# Patient Record
Sex: Female | Born: 1935 | Race: White | Hispanic: No | Marital: Married | Smoking: Never smoker
Health system: Southern US, Community
[De-identification: ages and names within clinical notes are randomized; demographics above are authoritative.]

## PROBLEM LIST (undated history)

## (undated) DIAGNOSIS — R110 Nausea: Secondary | ICD-10-CM

## (undated) DIAGNOSIS — R600 Localized edema: Secondary | ICD-10-CM

## (undated) DIAGNOSIS — I10 Essential (primary) hypertension: Secondary | ICD-10-CM

## (undated) DIAGNOSIS — C671 Malignant neoplasm of dome of bladder: Secondary | ICD-10-CM

## (undated) DIAGNOSIS — K5790 Diverticulosis of intestine, part unspecified, without perforation or abscess without bleeding: Secondary | ICD-10-CM

## (undated) DIAGNOSIS — I4892 Unspecified atrial flutter: Secondary | ICD-10-CM

## (undated) DIAGNOSIS — Z950 Presence of cardiac pacemaker: Secondary | ICD-10-CM

## (undated) DIAGNOSIS — K56609 Unspecified intestinal obstruction, unspecified as to partial versus complete obstruction: Secondary | ICD-10-CM

## (undated) DIAGNOSIS — R351 Nocturia: Secondary | ICD-10-CM

## (undated) DIAGNOSIS — D509 Iron deficiency anemia, unspecified: Secondary | ICD-10-CM

## (undated) DIAGNOSIS — Z933 Colostomy status: Secondary | ICD-10-CM

## (undated) DIAGNOSIS — C569 Malignant neoplasm of unspecified ovary: Secondary | ICD-10-CM

## (undated) DIAGNOSIS — R310 Gross hematuria: Secondary | ICD-10-CM

## (undated) DIAGNOSIS — E785 Hyperlipidemia, unspecified: Secondary | ICD-10-CM

## (undated) DIAGNOSIS — I4510 Unspecified right bundle-branch block: Secondary | ICD-10-CM

## (undated) DIAGNOSIS — I495 Sick sinus syndrome: Secondary | ICD-10-CM

## (undated) DIAGNOSIS — C679 Malignant neoplasm of bladder, unspecified: Secondary | ICD-10-CM

## (undated) HISTORY — PX: ELECTROPHYSIOLOGY STUDY: SHX5802

## (undated) HISTORY — DX: Unspecified right bundle-branch block: I45.10

## (undated) HISTORY — PX: CARDIOVASCULAR STRESS TEST: SHX262

## (undated) HISTORY — DX: Diverticulosis of intestine, part unspecified, without perforation or abscess without bleeding: K57.90

## (undated) HISTORY — DX: Hyperlipidemia, unspecified: E78.5

## (undated) HISTORY — PX: CARDIAC ELECTROPHYSIOLOGY MAPPING AND ABLATION: SHX1292

## (undated) HISTORY — DX: Essential (primary) hypertension: I10

## (undated) SURGERY — ECHOCARDIOGRAM, TRANSESOPHAGEAL
Anesthesia: Moderate Sedation

---
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
## 1970-07-26 HISTORY — PX: ABDOMINAL HYSTERECTOMY: SHX81

## 1984-07-26 HISTORY — PX: BLADDER NECK SUSPENSION: SHX1240

## 1998-02-04 ENCOUNTER — Ambulatory Visit (HOSPITAL_COMMUNITY): Admission: RE | Admit: 1998-02-04 | Discharge: 1998-02-04 | Payer: Self-pay | Admitting: Family Medicine

## 1998-02-13 ENCOUNTER — Other Ambulatory Visit: Admission: RE | Admit: 1998-02-13 | Discharge: 1998-02-13 | Payer: Self-pay | Admitting: Family Medicine

## 1999-02-17 ENCOUNTER — Other Ambulatory Visit: Admission: RE | Admit: 1999-02-17 | Discharge: 1999-02-17 | Payer: Self-pay | Admitting: Family Medicine

## 2000-02-09 ENCOUNTER — Encounter: Payer: Self-pay | Admitting: Family Medicine

## 2000-02-09 ENCOUNTER — Encounter: Admission: RE | Admit: 2000-02-09 | Discharge: 2000-02-09 | Payer: Self-pay | Admitting: Family Medicine

## 2000-02-18 ENCOUNTER — Other Ambulatory Visit: Admission: RE | Admit: 2000-02-18 | Discharge: 2000-02-18 | Payer: Self-pay | Admitting: Family Medicine

## 2001-02-09 ENCOUNTER — Encounter: Admission: RE | Admit: 2001-02-09 | Discharge: 2001-02-09 | Payer: Self-pay | Admitting: Family Medicine

## 2001-02-09 ENCOUNTER — Encounter: Payer: Self-pay | Admitting: Family Medicine

## 2001-02-13 ENCOUNTER — Encounter: Payer: Self-pay | Admitting: Family Medicine

## 2001-02-13 ENCOUNTER — Encounter: Admission: RE | Admit: 2001-02-13 | Discharge: 2001-02-13 | Payer: Self-pay | Admitting: Family Medicine

## 2001-02-20 ENCOUNTER — Other Ambulatory Visit: Admission: RE | Admit: 2001-02-20 | Discharge: 2001-02-20 | Payer: Self-pay | Admitting: Family Medicine

## 2002-02-12 ENCOUNTER — Encounter: Admission: RE | Admit: 2002-02-12 | Discharge: 2002-02-12 | Payer: Self-pay | Admitting: Family Medicine

## 2002-02-12 ENCOUNTER — Encounter: Payer: Self-pay | Admitting: Family Medicine

## 2002-02-21 ENCOUNTER — Other Ambulatory Visit: Admission: RE | Admit: 2002-02-21 | Discharge: 2002-02-21 | Payer: Self-pay | Admitting: Family Medicine

## 2003-01-31 ENCOUNTER — Encounter: Admission: RE | Admit: 2003-01-31 | Discharge: 2003-01-31 | Payer: Self-pay | Admitting: Family Medicine

## 2003-01-31 ENCOUNTER — Encounter: Payer: Self-pay | Admitting: Family Medicine

## 2003-02-26 ENCOUNTER — Other Ambulatory Visit: Admission: RE | Admit: 2003-02-26 | Discharge: 2003-02-26 | Payer: Self-pay | Admitting: Family Medicine

## 2004-01-29 ENCOUNTER — Encounter: Admission: RE | Admit: 2004-01-29 | Discharge: 2004-01-29 | Payer: Self-pay | Admitting: Family Medicine

## 2004-02-20 ENCOUNTER — Other Ambulatory Visit: Admission: RE | Admit: 2004-02-20 | Discharge: 2004-02-20 | Payer: Self-pay | Admitting: Family Medicine

## 2004-12-30 ENCOUNTER — Encounter: Admission: RE | Admit: 2004-12-30 | Discharge: 2004-12-30 | Payer: Self-pay | Admitting: Otolaryngology

## 2005-02-04 ENCOUNTER — Encounter: Admission: RE | Admit: 2005-02-04 | Discharge: 2005-02-04 | Payer: Self-pay | Admitting: Family Medicine

## 2005-02-24 ENCOUNTER — Other Ambulatory Visit: Admission: RE | Admit: 2005-02-24 | Discharge: 2005-02-24 | Payer: Self-pay | Admitting: Family Medicine

## 2005-03-15 ENCOUNTER — Ambulatory Visit: Payer: Self-pay | Admitting: Gastroenterology

## 2005-04-06 ENCOUNTER — Ambulatory Visit: Payer: Self-pay | Admitting: Gastroenterology

## 2006-01-20 ENCOUNTER — Encounter: Admission: RE | Admit: 2006-01-20 | Discharge: 2006-01-20 | Payer: Self-pay | Admitting: Family Medicine

## 2006-02-07 ENCOUNTER — Encounter: Admission: RE | Admit: 2006-02-07 | Discharge: 2006-02-07 | Payer: Self-pay | Admitting: Family Medicine

## 2007-02-10 ENCOUNTER — Encounter: Admission: RE | Admit: 2007-02-10 | Discharge: 2007-02-10 | Payer: Self-pay | Admitting: Family Medicine

## 2008-02-12 ENCOUNTER — Encounter: Admission: RE | Admit: 2008-02-12 | Discharge: 2008-02-12 | Payer: Self-pay | Admitting: Family Medicine

## 2008-05-14 ENCOUNTER — Ambulatory Visit: Payer: Self-pay | Admitting: Cardiovascular Disease

## 2008-05-14 ENCOUNTER — Inpatient Hospital Stay (HOSPITAL_COMMUNITY): Admission: EM | Admit: 2008-05-14 | Discharge: 2008-05-15 | Payer: Self-pay | Admitting: Emergency Medicine

## 2008-05-17 ENCOUNTER — Ambulatory Visit: Payer: Self-pay | Admitting: Cardiology

## 2008-05-21 ENCOUNTER — Ambulatory Visit: Payer: Self-pay | Admitting: Internal Medicine

## 2008-05-21 ENCOUNTER — Inpatient Hospital Stay (HOSPITAL_COMMUNITY): Admission: RE | Admit: 2008-05-21 | Discharge: 2008-05-21 | Payer: Self-pay | Admitting: Internal Medicine

## 2008-06-12 ENCOUNTER — Ambulatory Visit: Payer: Self-pay

## 2008-06-12 ENCOUNTER — Ambulatory Visit: Payer: Self-pay | Admitting: Internal Medicine

## 2008-07-17 HISTORY — PX: TRANSTHORACIC ECHOCARDIOGRAM: SHX275

## 2008-07-26 DIAGNOSIS — Z950 Presence of cardiac pacemaker: Secondary | ICD-10-CM

## 2008-07-26 HISTORY — DX: Presence of cardiac pacemaker: Z95.0

## 2008-07-30 ENCOUNTER — Ambulatory Visit: Payer: Self-pay | Admitting: Internal Medicine

## 2008-08-05 ENCOUNTER — Ambulatory Visit: Payer: Self-pay | Admitting: Internal Medicine

## 2008-08-05 LAB — CONVERTED CEMR LAB
Basophils Absolute: 0 10*3/uL (ref 0.0–0.1)
Calcium: 9.3 mg/dL (ref 8.4–10.5)
Creatinine, Ser: 0.9 mg/dL (ref 0.4–1.2)
Eosinophils Relative: 1.8 % (ref 0.0–5.0)
GFR calc Af Amer: 78 mL/min
INR: 1 (ref 0.8–1.0)
MCHC: 34.2 g/dL (ref 30.0–36.0)
Monocytes Absolute: 0.5 10*3/uL (ref 0.1–1.0)
Neutro Abs: 3.6 10*3/uL (ref 1.4–7.7)
Neutrophils Relative %: 65 % (ref 43.0–77.0)
Potassium: 4.5 meq/L (ref 3.5–5.1)
RBC: 4.09 M/uL (ref 3.87–5.11)
WBC: 5.5 10*3/uL (ref 4.5–10.5)

## 2008-08-12 ENCOUNTER — Ambulatory Visit: Payer: Self-pay | Admitting: Internal Medicine

## 2008-08-12 ENCOUNTER — Ambulatory Visit (HOSPITAL_COMMUNITY): Admission: RE | Admit: 2008-08-12 | Discharge: 2008-08-12 | Payer: Self-pay | Admitting: Internal Medicine

## 2008-09-06 ENCOUNTER — Inpatient Hospital Stay (HOSPITAL_COMMUNITY): Admission: EM | Admit: 2008-09-06 | Discharge: 2008-09-11 | Payer: Self-pay | Admitting: Emergency Medicine

## 2008-09-10 HISTORY — PX: PERMANENT PACEMAKER INSERTION: SHX6023

## 2009-03-04 ENCOUNTER — Encounter: Payer: Self-pay | Admitting: Cardiology

## 2009-03-11 ENCOUNTER — Encounter (INDEPENDENT_AMBULATORY_CARE_PROVIDER_SITE_OTHER): Payer: Self-pay | Admitting: *Deleted

## 2009-04-01 ENCOUNTER — Encounter (INDEPENDENT_AMBULATORY_CARE_PROVIDER_SITE_OTHER): Payer: Self-pay | Admitting: *Deleted

## 2010-02-09 ENCOUNTER — Encounter: Payer: Self-pay | Admitting: Internal Medicine

## 2010-08-25 NOTE — Letter (Signed)
Summary: Colonoscopy-Changed to Office Visit Letter  [facility]  [address] [place], [place] [address]   Phone: [phone]  Fax: [phone]      February 09, 2010 MRN: [id number]   [facility] [address] [place], [place]  [address]   Dear Ms. Kovacevic,   According to our records, it is time for you to schedule a Colonoscopy in the month of September 2011. However, after reviewing your medical record, I feel that an office visit would be most appropriate to more completely evaluate you and determine your need for a repeat procedure.  Please call [phone] (option #2) at your convenience to schedule an office visit. If you have any questions, concerns, or feel that this letter is in error, we would appreciate your call.   Sincerely,  Hedwig Morton. Juanda Chance, M.D.  [facility] [phone]

## 2010-11-10 LAB — CBC
HCT: 29.2 % — ABNORMAL LOW (ref 36.0–46.0)
HCT: 35.1 % — ABNORMAL LOW (ref 36.0–46.0)
HCT: 40.5 % (ref 36.0–46.0)
HCT: 32.5 % — ABNORMAL LOW (ref 36.0–46.0)
Hemoglobin: 11.2 g/dL — ABNORMAL LOW (ref 12.0–15.0)
Hemoglobin: 11.5 g/dL — ABNORMAL LOW (ref 12.0–15.0)
MCHC: 34.3 g/dL (ref 30.0–36.0)
MCHC: 34.3 g/dL (ref 30.0–36.0)
MCHC: 34.5 g/dL (ref 30.0–36.0)
MCHC: 35 g/dL (ref 30.0–36.0)
MCV: 92.7 fL (ref 78.0–100.0)
MCV: 92.9 fL (ref 78.0–100.0)
MCV: 91.5 fL (ref 78.0–100.0)
MCV: 92.2 fL (ref 78.0–100.0)
Platelets: 165 10*3/uL (ref 150–400)
Platelets: 203 10*3/uL (ref 150–400)
Platelets: 226 10*3/uL (ref 150–400)
RBC: 4.38 MIL/uL (ref 3.87–5.11)
RBC: 3.56 MIL/uL — ABNORMAL LOW (ref 3.87–5.11)
RDW: 13.5 % (ref 11.5–15.5)
RDW: 13.8 % (ref 11.5–15.5)
RDW: 13.7 % (ref 11.5–15.5)
RDW: 14 % (ref 11.5–15.5)
WBC: 4.4 10*3/uL (ref 4.0–10.5)
WBC: 5.3 10*3/uL (ref 4.0–10.5)

## 2010-11-10 LAB — BASIC METABOLIC PANEL
BUN: 8 mg/dL (ref 6–23)
CO2: 26 mEq/L (ref 19–32)
CO2: 23 mEq/L (ref 19–32)
Calcium: 9.3 mg/dL (ref 8.4–10.5)
Calcium: 8.8 mg/dL (ref 8.4–10.5)
Chloride: 97 mEq/L (ref 96–112)
Chloride: 104 mEq/L (ref 96–112)
GFR calc Af Amer: >60 mL/min (ref >60)
GFR calc non Af Amer: 55 mL/min — ABNORMAL LOW (ref >60)
Glucose, Bld: 100 mg/dL — ABNORMAL HIGH (ref 70–99)
Glucose, Bld: 94 mg/dL (ref 70–99)
Potassium: 4.8 mEq/L (ref 3.5–5.1)
Sodium: 131 mEq/L — ABNORMAL LOW (ref 135–145)
Sodium: 135 mEq/L (ref 135–145)

## 2010-11-10 LAB — DIFFERENTIAL
Basophils Absolute: 0 10*3/uL (ref 0.0–0.1)
Basophils Absolute: 0 10*3/uL (ref 0.0–0.1)
Basophils Absolute: 0 10*3/uL (ref 0.0–0.1)
Basophils Relative: 0 % (ref 0–1)
Basophils Relative: 0 % (ref 0–1)
Eosinophils Absolute: 0.1 10*3/uL (ref 0.0–0.7)
Eosinophils Absolute: 0.1 10*3/uL (ref 0.0–0.7)
Eosinophils Relative: 3 % (ref 0–5)
Eosinophils Relative: 1 % (ref 0–5)
Eosinophils Relative: 2 % (ref 0–5)
Lymphs Abs: 1.2 10*3/uL (ref 0.7–4.0)
Monocytes Absolute: 0.4 10*3/uL (ref 0.1–1.0)
Monocytes Absolute: 0.4 10*3/uL (ref 0.1–1.0)
Monocytes Absolute: 0.5 10*3/uL (ref 0.1–1.0)
Monocytes Relative: 10 % (ref 3–12)
Neutro Abs: 3.6 10*3/uL (ref 1.7–7.7)

## 2010-11-10 LAB — COMPREHENSIVE METABOLIC PANEL
ALT: 13 U/L (ref 0–35)
AST: 18 U/L (ref 0–37)
Albumin: 3.1 g/dL — ABNORMAL LOW (ref 3.5–5.2)
Calcium: 8.2 mg/dL — ABNORMAL LOW (ref 8.4–10.5)
Chloride: 101 mEq/L (ref 96–112)
Creatinine, Ser: 0.91 mg/dL (ref 0.4–1.2)
GFR calc non Af Amer: 60 mL/min — ABNORMAL LOW (ref >60)
Total Bilirubin: 0.6 mg/dL (ref 0.3–1.2)
Total Protein: 5.5 g/dL — ABNORMAL LOW (ref 6.0–8.3)

## 2010-11-10 LAB — URINALYSIS, ROUTINE W REFLEX MICROSCOPIC
Bilirubin Urine: NEGATIVE
Glucose, UA: NEGATIVE mg/dL
Ketones, ur: 15 mg/dL — AB
Protein, ur: NEGATIVE mg/dL
Specific Gravity, Urine: 1.007 (ref 1.005–1.030)
pH: 7.5 (ref 5.0–8.0)

## 2010-11-10 LAB — URINE MICROSCOPIC-ADD ON

## 2010-11-10 LAB — CARDIAC PANEL(CRET KIN+CKTOT+MB+TROPI)
CK, MB: 4.6 ng/mL — ABNORMAL HIGH (ref 0.3–4.0)
Relative Index: 3.4 — ABNORMAL HIGH (ref 0.0–2.5)
Troponin I: 0.01 ng/mL (ref 0.00–0.06)

## 2010-11-10 LAB — PROTIME-INR
INR: 2.9 — ABNORMAL HIGH (ref 0.00–1.49)
Prothrombin Time: 25.8 seconds — ABNORMAL HIGH (ref 11.6–15.2)

## 2010-11-10 LAB — HEPARIN LEVEL (UNFRACTIONATED): Heparin Unfractionated: 0.36 IU/mL (ref 0.30–0.70)

## 2010-11-10 LAB — POCT CARDIAC MARKERS: Troponin i, poc: <0.05 ng/mL (ref 0.00–0.09)

## 2010-12-08 NOTE — Letter (Signed)
July 30, 2008    Madaline Savage, MD  [id number], [address]  [place], [place] [address]   RE:  Victoria, [name]  MRN:  [id number]  /  DOB:  [birth year]   Dear Victoria [name],   It was a pleasure seeing Victoria [name] again today at your request.  As  you recall, she has atrial flutter.  She underwent catheter ablation for  atrial flutter back in October.  Unfortunately, she had recurrent  palpitations.  An electrocardiogram from your office confirmed that she  was in 2-1 flutter again.  Her Cardizem was resumed.  She has had  problems with subsequent bradycardia as manifested by her  electrocardiogram today with a heart rate of 45.   Thromboembolic risk factors are normal for age, gender, and  hypertension.  Given her [place] VAS score of 4 and the thromboembolic risk  in the range of about 5%.   OTHER MEDICATIONS:  1. Zyrtec.  2. Aspirin.  3. Prilosec.  4. Diovan, HCT 80/12.5.  5. Zocor.   PHYSICAL EXAMINATION:  VITAL SIGNS:  Blood pressure today was mildly  elevated to 145/76.  Her pulse was 50.  HEENT:  Demonstrated no icterus or xanthoma.  NECK:  Neck veins were flat.  LUNGS:  Clear.  BACK:  Without kyphosis.  HEART:  Sounds were regular without murmurs or gallops.  ABDOMEN:  Soft.  EXTREMITIES:  No edema.   Electrocardiogram dated today demonstrated sinus rhythm of 40 with  intervals 0.17/0.08/0.47, the axis was 75 degrees.  There is T-wave  flattening in the anterior leads and inversion in V2 which are all old.   IMPRESSION:  1. Atrial flutter - Recurrent status post catheter ablation.  2. Bradycardia.  3. Thromboembolic risk factor is notable for,      a.     Age.      b.     Gender.      c.     Hypertension.   Victoria [name], Victoria [name] has recurrent atrial flutter which is moderately  symptomatic.  She also has a [place] score of 3 or [place] VAS score of 4,  both of which given her an estimated annualized risk in the range of 4-  6% in the absence of Coumadin.   I have  discussed with her that I think Coumadin and/or catheter ablation  is the appropriate therapy.  In the event that she elects not to undergo  catheter ablation, the reluctance of which derives from an expectation  that A was 100% and B that if it did not work first time, why should it  work the second time.  I think she should be on Coumadin indefinitely.  Hopefully, her bradycardia will be an issue either.   As I went over this with her she to me she had an appointment to see you  in May.  She is to get back with [place] in the short-term as to which she  would like to do.    Sincerely,      Duke Salvia, MD, [facility]  Electronically Signed    SCK/MedQ  DD: 07/30/2008  DT: 07/31/2008  Job #: [id number]

## 2010-12-08 NOTE — Op Note (Signed)
Victoria [name], Victoria [name]               ACCOUNT NO.:  [id number]   MEDICAL RECORD NO.:  [id number]          PATIENT TYPE:  OIB   LOCATION:  4705                         FACILITY:  [facility]   PHYSICIAN:  Duke Salvia, MD, FACCDATE OF BIRTH:  10-16-1926   DATE OF PROCEDURE:  05/21/2008  DATE OF DISCHARGE:  05/21/2008                               OPERATIVE REPORT   PREOPERATIVE DIAGNOSIS:  Atrial flutter.   POSTOPERATIVE DIAGNOSIS:  Atrial flutter.   PROCEDURES:  Invasive electrophysiological study, substrate mapping, and  catheter ablation.   DESCRIPTION OF PROCEDURE:  Following obtaining informed consent, the  patient was brought to the electrophysiology laboratory and placed on  the fluoroscopic table in supine position.  After routine prep and  drape, cardiac catheterization was performed with local anesthesia and  conscious sedation.  Noninvasive blood pressure monitoring,  transcutaneous oxygen saturation monitoring and end-tidal CO2 monitoring  were performed continuously throughout the procedure.  Following the  procedure, the catheters were removed.  Hemostasis was obtained and the  patient was transferred to the floor in stable condition.   Catheters of 5-French quadripolar catheter was inserted via the left  femoral vein to the AV junction.  A 6-French octapolar catheter was inserted via the right femoral vein to  the coronary sinus.  A 7-French dual decapolar catheter was inserted via the left femoral  vein to the tricuspid annulus.  An 8-French 8-mm deflectable tip catheter was inserted via the right  femoral vein to mapping sites in the posterior septal space.   Surface leads I, aVF, and V1 were monitored continuously throughout the  procedure.  Following insertion of the catheters, a stimulation protocol  included:  Incremental atrial pacing.  Incremental ventricular pacing.  Single atrial and double atrial extrastimuli at the paced cycle length  of 700  milliseconds.   RESULTS:  Surface electrocardiogram;  Initial:  Rhythm:  Sinus; RR interval 1021 milliseconds; PR interval:  170  milliseconds; QRS duration 99 milliseconds; QT interval:  429  milliseconds; P-wave duration 114 milliseconds; preexcitation:  Absent;  bundle-branch block:  Absent.  A-H interval 105 milliseconds; H-V interval:  34 milliseconds.  Final:  Rhythm:  Sinus; RR interval 1137 milliseconds; PR interval:  165  milliseconds; QRS duration 103 milliseconds; QT interval:  461  milliseconds; P-wave duration 110 milliseconds; bundle-branch block:  Absent; preexcitation:  Absent.  AV nodal function;  AV Wenckebach was less than 400 milliseconds.  VA Wenckebach was 400 milliseconds.  AV nodal conduction was continuous without evidence of echo beats.   Accessory pathway function:  No evidence of accessory pathway was  identified.   Ventricular response to programmed stimulation:  Normal for ventricular  stimulation as described previously.   Arrhythmias induced:  The patient's typical atrial flutter prompted  ablation of the atrial flutter substrate across the cavotricuspid  isthmus without the induction of the atrial flutter.  RF was applied  across the cavotricuspid isthmus at approximately 5 o'clock on the  annulus.  Progressive conduction block was seen at the posterior portion  of the isthmus with subsequent elimination of bidirectional conduction.  The A1,  A2 interval following ablation was greater than 135  milliseconds.   Radiofrequency energy; a total of 4 minutes and 31 seconds of RF energy  was applied across the cavotricuspid isthmus.   Fluoroscopy time, a total of 4 minutes and 50 seconds of fluoroscopy was  utilized at 7.5 frames per second.   IMPRESSION:  1. Normal sinus function - bradycardia.  2. Abnormal atrial function manifested by previously demonstrated      typical atrial flutter.  3. Normal atrioventricular nodal function.  4. Normal  His-Purkinje system function.  5. No accessory pathway.  6. Normal ventricular response to stimulation.   SUMMARY CONCLUSION:  The results of electrophysiological testing  confirmed a substrate for cavotricuspid isthmus dependent to atrial  flutter.  Catheter ablation across the isthmus successfully interrupted  bidirectional conduction and eliminated thereby the substrate for the  patient's arrhythmia.   The patient tolerated the procedure without apparent complication.      Duke Salvia, MD, [facility]  Electronically Signed     SCK/MEDQ  D:  05/21/2008  T:  05/21/2008  Job:  [id number]   cc:   Quita Skye. Artis Flock, M.D.

## 2010-12-08 NOTE — Discharge Summary (Signed)
NAMESKARLETH, DELMONICO               ACCOUNT NO.:  [id number]   MEDICAL RECORD NO.:  [id number]          PATIENT TYPE:  INP   LOCATION:  2007                         FACILITY:  [facility]   PHYSICIAN:  Duke Salvia, MD, FACCDATE OF BIRTH:  December 26, 1926   DATE OF ADMISSION:  05/14/2008  DATE OF DISCHARGE:  05/15/2008                               DISCHARGE SUMMARY   PROCEDURES:  None.   PRIMARY FINAL DISCHARGE DIAGNOSIS:  Atrial flutter, paroxysmal with  rapid ventricular response.   SECONDARY DIAGNOSES:  1. Hypertension.  2. Hyperlipidemia.  3. Hiatal hernia.  4. History of pleurisy with resultant rib surgery at age 12.  5. Status post hysterectomy and bladder tack.  6. History of diverticulosis.  7. Anticoagulation with Coumadin, initiated this admission.  8. Family history of coronary artery disease in her mother.  9. History of syncope x3 prior to admission, possibly secondary to      post termination pauses.   TIME AT DISCHARGE:  Thirty nine minutes.   HOSPITAL COURSE:  Ms. Mcclenahan is a 77 year old female with no previous  history of coronary artery disease.  She had palpitations for a week and  has passed out 3 times.  She came to the hospital where she was found to  be in atrial flutter and was admitted for further evaluation.   Initially, the plan was for TEE cardioversion and this was scheduled.  However, she spontaneously converted to sinus rhythm and this was  cancelled.  Upon review of telemetry, she was noted to be going in and  out of atrial flutter and Dr. Graciela Husbands felt that her atrial arrhythmia with  paroxysmal.  He, therefore, felt that she did not need further inpatient  workup but instead needed an outpatient ablation.  She was started on  Coumadin at 5 mg a day and is to get an INR checked on Friday.  A  radiofrequency catheter ablation will be scheduled for next week.  She  was also placed on Cardizem for rate control.  Ms. Verne was evaluated  by Dr. Graciela Husbands and  considered stable for discharge with close outpatient  followup.   DISCHARGE INSTRUCTIONS:  1. Her activity level is to be increased gradually with no driving      until cleared by Dr. Graciela Husbands.  2. She is to stick to a low-fat diet.  3. She is to get an INR checked on Friday, May 17, 2008, at 10:30.  4. She is to get an ablation on Tuesday, May 21, 2008 and to be at      short stay at 6:00 a.m.  She is not to eat or drink anything after      midnight and she is to hold her Cardizem the day of the procedure.   DISCHARGE MEDICATIONS:  1. Coumadin 5 mg daily, hold Sunday and Monday with the ablation on      Tuesday.  2. Aspirin 81 mg daily.  3. Cardizem CD 120 mg a day.  4. Divan HCT 80/12.5 mg daily.  5. Zocor 20 mg daily.  6. Zyrtec 10 mg daily.  Theodore Demark, PA-C      Duke Salvia, MD, [facility]  Electronically Signed    RB/MEDQ  D:  05/15/2008  T:  05/15/2008  Job:  [id number]

## 2010-12-08 NOTE — Letter (Signed)
June 12, 2008    Quita Skye. Artis Flock, M.D.  [address]  [place], [place] [address]   RE:  SHYRL, OBI  MRN:  [id number]  /  DOB:  [birth year]   Dear Rosanne Ashing,   Ms. Dullea has continued to have flutters following her ablation of  atrial flutter.  She describes these as irregular and somewhat distinct  from her preprocedural spells.  She has had no recurrent syncope.   Her thromboembolic risk factors are notable for age, hypertension, and  gender.   Her medications currently include Zocor, Diovan, Prilosec, and aspirin.  Cardizem and Coumadin were discontinued at the time of her flutter  ablation which was successful.   On examination, her blood pressure was 124/80, her pulse was 60.  Her  lungs were clear.  Heart sounds were regular.  The extremities were  without edema.   Electrocardiogram dated today demonstrated sinus rhythm at 60 with  intervals of 0.15/0.08/0.44.  The axis was leftward at -55.   IMPRESSION:  1. Atrial flutter status post ablation.  2. Recurrent tachypalpitations, question atrial fibrillation.  3. History of syncope, question post-termination pauses.  4. Thromboembolic risk factors notable for:      a.     Hypertension.      b.     Age.      c.     Gender.   Rosanne Ashing, Ms. Bottoms has recurrent tachypalpitations.  I think it is  important for [place] to understand whether they are fibrillation or flutter  so as to help better define therapeutic options.  Either would prompt [place]  to reinitiate Coumadin; I will hold off until we have monitored data to  do that.  I will, however, resume Cardizem at this time.   The issue of syncope remains a concern; if these are post-termination  pauses and atrial fibrillation is recurring, then consideration of  pacing would be in order.  If they are unassociated with pausing, then  neurally-mediated reflexes might be the explanation and would need to be  treated separately.   She is scheduled to see Dr. Chanda Busing to establish with cardiology  in about 6 weeks.  I will forward to him a copy of this letter as well  who will give her an event recorder, and I will be seeing her in 4-5  weeks' time.    Sincerely,      Duke Salvia, MD, [facility]  Electronically Signed    SCK/MedQ  DD: 06/12/2008  DT: 06/13/2008  Job #: [id number]   CC:    Madaline Savage, M.D.

## 2010-12-08 NOTE — Discharge Summary (Signed)
NAMEJERSEY, ESPINOZA               ACCOUNT NO.:  [id number]   MEDICAL RECORD NO.:  [id number]          PATIENT TYPE:  OIB   LOCATION:  4705                         FACILITY:  [facility]   PHYSICIAN:  Duke Salvia, MD, FACCDATE OF BIRTH:  13-Mar-1927   DATE OF ADMISSION:  05/21/2008  DATE OF DISCHARGE:  05/21/2008                               DISCHARGE SUMMARY   PRIMARY CARE PHYSICIAN:  Quita Skye. Kindl, MD   ALLERGIES:  This patient has allergies to BETADINE, SHELLFISH, and IV  DYE.   FINAL DIAGNOSES:  1. Paroxysmal atrial flutter/rapid ventricular rate.  2. Electrophysiology study with empiric radiofrequency catheter      ablation of caval tricuspid isthmus by Dr. Sherryl Manges.  The      patient was in sinus rhythm on presentation for her procedure.   SECONDARY DIAGNOSES:  1. Hypertension.  2. Hyperlipidemia.  3. Hiatal hernia.  4. History of diverticulosis.  5. Status post hysterectomy, status post bladder tacking.  6. History of syncope.   PROCEDURE:  May 21, 2008, electrophysiology study with  radiofrequency catheter ablation of the caval tricuspid isthmus.  This  was done empirically.  Atrial flutter could not be induced, Dr. Sherryl Manges, practitioner.   BRIEF HISTORY:  Ms. Canevari is a 77 year old female.  She has no prior  cardiac history.  About a week before presentation to Dr. Fabio Bering  office, the patient had begun to experience fluttering and tachy  palpitations.  She had no chest pain.  She had no dyspnea.   The symptoms were off and on all week, sometimes occurring for a good  portion of the day.  She did not notice any significant limitation in  activity.   On Friday night, May 10, 2008, while stooping down, she became  acutely lightheaded.  She fell as though the room were spinning and lost  consciousness.  She fell forward to the floor.  This was witnessed by  her husband.  She regained consciousness after a few seconds.  She did  not note any  fluttering after the episode.  The patient had no loss of  bowel or bladder function.  She had no unilateral weakness following the  episode.  She had another episode of dizziness that room began spinning  on Saturday morning, but did not have any loss of consciousness.  She  has continued to have fluttering and palpitations through the weekend.  She saw her primary care giver, Dr. Artis Flock on May 14, 2008, Monday.  She was transferred by way of EMS to [facility].  Electrocardiogram had  shown atrial flutter with tachycardia.  The question for practitioners  is how long has her atrial flutter been in existence and does she have  post termination pauses which cause her episodes of dizziness and  syncope.  The patient was admitted to [facility] and observed  on telemetry for a 24-hour period.  Because the patient has an episodic  or paroxysmal atrial flutter and spends most of her time in sinus  rhythm, the patient will not need a transesophageal echocardiogram prior  to  any ablation procedure.  This will be set up as soon as possible on  an elective basis and is currently scheduled for Monday, May 21, 2008.  The patient will continue on Coumadin until that time.   HOSPITAL COURSE:  The patient presents on May 21, 2008.  She was in  sinus rhythm.  She has been taking her medications.  She was admitted to  the Electrophysiology Lab.  Apparently, atrial flutter could not be  induced, however, there was empiric radiofrequency catheter ablation of  her cavotricuspid isthmus.  Once again, no inducibility of atrial  flutter.  The patient was discharged the same day, May 21, 2008.  She goes home on the following medications.  1. She is to restart enteric-coated aspirin 81 mg daily.  2. Diovan HCT 80/12.5 one tablet daily.  3. Prilosec 20 mg daily.  4. Zyrtec 10 mg daily.  5. Simvastatin 20 mg daily.   The patient is asked to stop taking her diltiazem.  She is asked to  stop  taking her Coumadin.  She follows up with [facility] to see Dr. Graciela Husbands on Wednesday, June 12, 2008 at  11:45 a.m.   LABORATORY STUDIES:  Pertinent to this admission were drawn on May 14, 2008.  Sodium 132, potassium 3.9, chloride is 98, carbonate 27,  glucose 107, BUN is 7, and creatinine is 0.81.  Alkaline phosphatase is  74, SGOT 24, and SGPT is 13.  White blood cells 5.4, hemoglobin 13.5,  hematocrit 40.2, and platelets are 216.  Blood draw on May 15, 2008  was Protime 13.8 and INR was 1.0.      Maple Mirza, [place]      Duke Salvia, MD, [facility]  Electronically Signed    GM/MEDQ  D:  05/21/2008  T:  05/22/2008  Job:  [id number]   cc:   Quita Skye. Artis Flock, M.D.

## 2010-12-08 NOTE — H&P (Signed)
[name], [name]               ACCOUNT NO.:  [id number]   MEDICAL RECORD NO.:  [id number]          PATIENT TYPE:  INP   LOCATION:  2007                         FACILITY:  [facility]   PHYSICIAN:  Victoria Pick. Eden Emms, MD, FACCDATE OF BIRTH:  Mar 06, 1927   DATE OF ADMISSION:  05/14/2008  DATE OF DISCHARGE:                              HISTORY & PHYSICAL   PRIMARY CARDIOLOGIST:  [facility] being seen by Dr. Eden [name].   PRIMARY CARE Victoria [name]:  Victoria Skye. Kindl, MD.   PATIENT PROFILE:  A 77 year old Caucasian female who presents with a 1-  week history of fluttering and palpitations and found to be in atrial  flutter.   PROBLEMS:  1. Atrial flutter with rapid ventricular response.  2. Hypertension.  3. Hyperlipidemia.  4. Hiatal hernia.  5. History of rib surgery secondary to pleurisy, scarlet fever at age      78.  79. Status post hysterectomy at age 46.  46. Status post bladder tack in 1981.  8. Diverticulosis.   HISTORY OF PRESENT ILLNESS:  A 77 year old Caucasian female without  prior cardiac history.  Approximately 1 week ago, she began to  experience fluttering and tachy palpitations without chest pain or  dyspnea.  These symptoms were intermittent all week and often occurring  for good portion of the day.  She did not notice any significant  limitations in her activities.  On Friday night on May 10, 2008,  while stooping down, she became acutely lightheaded and felt as though  the room was spinning and then subsequently lost consciousness falling  forward to the floor.  This was witnessed by her husband.  She  apparently regained the consciousness after a few seconds and did not  note any fluttering after the episode.  There was no chest pain,  shortness of breath, loss of bowel or bladder function, or unilateral  weakness following this episode.  She had another episode of dizziness  with the room spinning on Saturday morning, but did not have any  syncope.  She  continued to have fluttering and palpitations throughout  the weekend.  Saw the Dr. Artis Flock today where she was noted to be  tachycardic and A-flutter.  She was transferred via EMS to the [facility] ED.  Currently, she is asymptomatic with the exception of noticing  some fluttering.   ALLERGIES:  No known drug allergies.   HOME MEDICATIONS:  1. Simvastatin 20 mg nightly.  2. Diovan HCT 80/12.5 mg daily.  3. Prilosec 20 mg daily.  4. Aspirin 81 mg daily.   FAMILY HISTORY:  Mother died of an MI at 95.  Father died of cirrhosis  of the liver following a history of alcoholism at age 37.  She has a  brother who is alive and well.  Her sister is 28 who is status post CVA  at age 46.  She has another sister who is 23 with a history of TIAs.   SOCIAL HISTORY:  She lives in [place] with her husband.  She is  retired from the [employer].  She denies tobacco, alcohol, or  drug use.  She walks 1 mile daily and is very active around her yard  stating she has a beautiful yard.   REVIEW OF SYSTEMS:  Positive for presyncope and tachy palpitations,  otherwise all systems reviewed are negative.   PHYSICAL EXAMINATION:  VITAL SIGNS:  Temperature 97.0, heart rate 150,  respirations 22, blood pressure 160/99, and pulse ox 98% on 2 liters.  Heart rate came down to 70s following 20 mg IV bolus of diltiazem.  GENERAL:  Pleasant white female in no acute distress.  Awake, alert, and  oriented x3.  HEENT:  Normal.  NECK:  No bruits, JVD, or adenopathy.  LUNGS:  Respirations regular and unlabored.  CARDIAC:  Regular, tachycardic, S1 and S2.  No S3, S4, or murmurs.  NEUROLOGIC:  Grossly intact, nonfocal.  SKIN:  Warm and dry without lesions or masses.  MUSCULOSKELETAL:  No gross deformity with full range of motion in upper  and lower extremities.  ABDOMEN:  Round, soft, nontender, and nondistended.  Bowel sounds  present x4.  EXTREMITIES:  Warm, dry, and pink.  No clubbing, cyanosis, or  edema.  Dorsalis pedis and posterior tibial pulses 2+ and equal bilaterally.  There is no petechiae.   Chest x-ray is pending.  EKG shows atrial flutter, it is currently had  rate of 78.  Normal axes.  No acute ST-T changes.   All of her lab work is pending.   ASSESSMENT/PLAN:  1. Atrial flutter, question duration.  The patient says first symptoms      8 days ago and they were on and off all week.  She denies chest      pain or dyspnea.  Rate responded well with IV diltiazem and      currently, she is in the 70s.  Plan to admit, second cardiac      markers as well as electrolytes, magnesium, TSH, and a 2-D      echocardiogram.  Electrophysiology consults for possible      radiofrequency catheter ablation and was subsequently planned for      TE on Thursday with either ablation or cardioversion to follow.  We      will continue IV diltiazem at 5 mg an hour and subsequently convert      to p.o.  Avoid heparin and Coumadin.  2. Syncope.  Certainly, we have to question whether or not this could      be related to a post-termination pause as she denies experiencing      palpitations following syncopal episode the other night  We will      watch her closely on telemetry.  3. Hypertension.  Follow on IV diltiazem.  Continue Diovan HCT.  4. Hyperlipidemia.  Check lipids and LFTs.  Continue statin.  5. Hiatal hernia.  Continue PPI.      Victoria [name], ANP      Victoria Pick. Eden Emms, MD, [facility]  Electronically Signed    CB/MEDQ  D:  05/14/2008  T:  05/15/2008  Job:  [id number]

## 2010-12-11 NOTE — Op Note (Signed)
[name], Victoria [name]               ACCOUNT NO.:  [id number]   MEDICAL RECORD NO.:  [id number]          PATIENT TYPE:  OIB   LOCATION:  4705                         FACILITY:  [facility]   PHYSICIAN:  Duke Salvia, MD, FACCDATE OF BIRTH:  Jun 16, 1927   DATE OF PROCEDURE:  DATE OF DISCHARGE:                               OPERATIVE REPORT   PREOPERATIVE DIAGNOSIS:  Atrial flutter.   POSTOPERATIVE DIAGNOSIS:  Multiple atrial flutter defibrillation.   PROCEDURE:  Invasive electrophysiological study.   Following obtaining informed consent, the patient was brought to the  electrophysiology laboratory and placed on the fluoroscopic table in the  supine position.  After routine prep and drape, cardiac catheterization  was performed with local anesthesia and conscious sedation.  Noninvasive  blood pressure monitoring, transcutaneous oxygen saturation monitoring,  and end-tidal CO2 monitoring were performed continuously throughout the  procedure.  After the procedure, the catheters were removed, hemostasis  was obtained, and the patient was transferred to the holding area in  stable condition.   CATHETERS:  1. A 35-French quadripolar catheter was inserted via the left femoral      vein to the AV junction.  2. A 6-French octapolar catheter was inserted via the right femoral      vein to the coronary sinus.  3. A 7-French dual decapolar catheter was inserted appropriately into      the tricuspid annulus.   Surface leads I and aVF and V1 were monitored continuously throughout  the procedure. Following insertion of the catheters, a stimulation  protocol included incremental atrial pacing.   RESULTS:  Surface electrocardiogram at basic intervals.  Rhythm:  Sinus; RR interval 942 milliseconds; PR interval:  178  milliseconds; QRS duration 83 milliseconds; QT interval:  N/M; P-wave  duration 128 milliseconds; AH interval 88 milliseconds; HV interval 49  milliseconds.   Pacing across the  tricuspid annulus demonstrated conduction block at the  isthmus from her previous flutter ablation.  However, multiple other  atrial flutters and defibrillations were identified.  At this point, the  procedure was terminated, catheters were removed, and the patient was  then considered for medical therapy.   A total of 2 minutes and 30 seconds of fluoroscopy time was utilized at  7.5 frames per second.       Duke Salvia, MD, [facility]  Electronically Signed    SCK/MEDQ  D:  09/26/2008  T:  09/27/2008  Job:  [id number]

## 2010-12-11 NOTE — Discharge Summary (Signed)
NAMEBERTRICE, LEDER               ACCOUNT NO.:  [id number]   MEDICAL RECORD NO.:  [id number]          PATIENT TYPE:  INP   LOCATION:  3733                         FACILITY:  [facility]   PHYSICIAN:  Antonieta Iba, MD   DATE OF BIRTH:  September 20, 1926   DATE OF ADMISSION:  09/06/2008  DATE OF DISCHARGE:  09/11/2008                               DISCHARGE SUMMARY   DISCHARGE DIAGNOSES:  1. Syncope, secondary to paroxysmal atrial fibrillation most likely.  2. Chest pain.  Negative myocardial infarction.  3. Dyspnea on exertion.  4. Sick sinus syndrome with atrial flutter with rapid ventricular      response and history of bradycardia, unable to take rate-slowing      medications of any type.  5. Anticoagulation secondary to paroxysmal atrial fibrillation.  6. Placement of permanent transvenous pacemaker, [name].   DISCHARGE CONDITION:  Improved.   PROCEDURES:  September 10, 2008, permanent transvenous pacemaker, [employer], serial number [id number] with an A and V lead.   DISCHARGE MEDICATIONS:  1. Diovan 80 mg daily.  2. Prilosec 20 mg daily.  3. Aspirin 81 mg daily.  4. Simvastatin 20 mg daily.  5. Coumadin 5 mg daily.  6. Imdur 30 mg tablet half a tablet daily to equal 15 mg daily.  7. Metoprolol 50 mg tablet to take half a tablet twice a day.  This is      metoprolol tartrate (Lopressor).  8. Cardizem CD 240 mg 1 tablet daily.  9. He may use Benadryl at home for skin burning at pacer site      secondary to Betadine.   DISCHARGE INSTRUCTIONS:  1. Low-sodium, heart-healthy diet.  2. Follow pacemaker instruction sheet for activity and showers.  3. See Dr. Lynnea Ferrier, September 17, 2008, at noon.  4. Follow up in [facility], September 16, 2008, at 11 a.m.  5. If you have any swelling, bleeding, or pain in the pacemaker site,      call our office.   HISTORY OF PRESENT ILLNESS:  Delightful 77 year old female with complex  history of syncope leading to ablation  of SVT, atrial flutter in the  past, and the plan had been to do a further atrial flutter ablation,  May 21, 2008, but they were unable to do so.  On the day of  admission, September 05, 2008, she was making her bed, suddenly was  picking herself up off the floor.  She was on her stomach.  She had no  chest pain.  She thinks she may have been out 1 or 2 seconds, and she  had no injuries.  She did also complain of shortness of breath over the  last 2 weeks, increased with exertion, very short of breath and had to  pace herself.  In the emergency room, after her rate had slowed, she was  complaining of chest pressure.  Her blood pressure was up as well.  The  pressure would come and go.  She had had a negative nuclear study,  December 2009.  EF at that time was 83%.  Other history  includes  hypertension, hyperlipidemia, hiatal hernia, scarlet fever at age 35.  Two-D echo in December 2009, EF was greater than 55%, moderate MR,  moderate TR.   She had been followed with Dr. Graciela Husbands, and he felt a lot of her syncope  may be related when she converted from atrial flutter to sinus, she  would have conversion bradycardia.  The patient was seen and evaluated  by Dr. Mariah Milling, and we admitted her.  We stopped her Coumadin, placed her  on heparin when her INR was subtherapeutic, and we were planning for  cardiac catheterization.  We also put her on Cardizem.  She stabilized.  At one point, we were going to do a nuclear study, but she was feeling  so much better and her cardiac enzymes were negative, it was felt to  cancel on the nuclear study and proceed with permanent pacemaker, which  we did, and she tolerated that very well.  She was seen the next  morning, chest x-ray without pneumothorax, and she was stable and ready  for discharge home.   PHYSICAL EXAMINATION:  VITAL SIGNS:  At discharge, blood pressure  125/75, temperature 98.6, heart rate 62, room air oxygen saturation 93%.  CNS:  Normal.   LUNGS:  Clear to auscultation bilaterally.  HEART:  Regular rate and rhythm.  SKIN:  Warm and dry.  Pacer site without edema.   Please note the site was then cleaned by the nurses with Betadine, but  she was allergic to BETADINE.  She began itching, so the dressing site  was changed and we gave her some Benadryl prior to her discharge home.   LABORATORY DATA:  Hemoglobin 13.9, hematocrit 40.5 on admission, WBC  5.3.  Hemoglobin got as low at 10.1 with hematocrit of 29.2, but at  discharge hemoglobin 11.2, hematocrit 32.5, platelets 167, neutrophils  56, lymphs 30, monos 10, eos 3, baso 0.  Pro time 32.3 on admission with  an INR of 2.9.  The Coumadin was discontinued, and she slowly drifted  down.  She was started orally but no crossover prior to discharge.  Discharge INR was 1.2.   Chemistry:  Sodium 131, potassium 4.8, chloride 97, CO2 of 26, glucose  100, BUN 8, creatinine 0.98.  These remained stable during  hospitalization.  Total protein 5.5, albumin 3.1.  AST 18, ALT 13,  alkaline phos 66, total bili 0.6.  CK 177, 148, 151; negative MBs 5.2,  4.6, and 5.1; troponin Is were negative, less than 0.01.  TSH 3.625.  UA:  Moderate leukocyte esterase but negative nitrites.   Chest x-ray on admission, clear.  Post-pacemaker chest x-ray, 2-view,  status post placement of left subclavian permanent cardiac pacer, no  pneumothorax or other acute findings, focal eventration of the left  hemidiaphragm to the gastric fundus position in the left lower chest.   EKGs:  On admission, she was in atrial flutter, rate 144.  She slowed to  a rate of 73 and continued in flutter, though at the time of discharge  sinus rhythm.  No acute changes otherwise.   HOSPITAL COURSE:  As stated, the patient was admitted, eventually  received permanent transvenous pacemaker, and was ready for discharge  home.  She will follow up as instructed, initially with Dr. Lynnea Ferrier and  then back to see Dr.  Elsie Lincoln.      Darcella Gasman. Annie Paras, N.P.      Antonieta Iba, MD  Electronically Signed    LRI/MEDQ  D:  10/16/2008  T:  10/17/2008  Job:  [id number]   cc:   Madaline Savage, M.D.  Duke Salvia, MD, [facility]  Barry Dienes. Eloise Harman, M.D.  Ritta Slot, MD

## 2011-04-27 LAB — CBC
HCT: 39.1
Hemoglobin: 13
Hemoglobin: 13.5
MCHC: 33.7
MCV: 93.5
Platelets: 209
Platelets: 216
RDW: 13.4
RDW: 13.7

## 2011-04-27 LAB — DIFFERENTIAL
Basophils Absolute: 0
Basophils Relative: 0
Eosinophils Relative: 1
Lymphocytes Relative: 20
Monocytes Absolute: 0.3

## 2011-04-27 LAB — COMPREHENSIVE METABOLIC PANEL
AST: 24
Albumin: 3.8
Alkaline Phosphatase: 74
BUN: 7
CO2: 27
Chloride: 98
Creatinine, Ser: 0.81
GFR calc Af Amer: >60
GFR calc non Af Amer: >60
Potassium: 3.9
Total Bilirubin: 0.4

## 2011-04-27 LAB — HEPARIN LEVEL (UNFRACTIONATED)
Heparin Unfractionated: 0.48
Heparin Unfractionated: 0.82 — ABNORMAL HIGH

## 2011-04-27 LAB — LIPID PANEL
HDL: 43
LDL Cholesterol: 82
Total CHOL/HDL Ratio: 3.3
Triglycerides: 85
VLDL: 17

## 2011-04-27 LAB — PROTIME-INR
INR: 1
Prothrombin Time: 13.8

## 2011-04-27 LAB — CK TOTAL AND CKMB (NOT AT ARMC)
CK, MB: 4
Relative Index: 3.9 — ABNORMAL HIGH
Relative Index: 4 — ABNORMAL HIGH
Total CK: 100
Total CK: 89

## 2011-04-27 LAB — MAGNESIUM: Magnesium: 2

## 2011-12-17 ENCOUNTER — Other Ambulatory Visit (HOSPITAL_COMMUNITY): Payer: Self-pay | Admitting: Internal Medicine

## 2011-12-17 DIAGNOSIS — R197 Diarrhea, unspecified: Secondary | ICD-10-CM

## 2011-12-22 ENCOUNTER — Ambulatory Visit (HOSPITAL_COMMUNITY)
Admission: RE | Admit: 2011-12-22 | Discharge: 2011-12-22 | Disposition: A | Discharge status: Home / Self Care | Payer: Medicare Other | Source: Ambulatory Visit | Attending: Internal Medicine | Admitting: Internal Medicine

## 2011-12-22 DIAGNOSIS — R197 Diarrhea, unspecified: Secondary | ICD-10-CM

## 2011-12-22 DIAGNOSIS — K5730 Diverticulosis of large intestine without perforation or abscess without bleeding: Secondary | ICD-10-CM | POA: Insufficient documentation

## 2012-02-23 ENCOUNTER — Other Ambulatory Visit: Payer: Self-pay | Admitting: Internal Medicine

## 2012-02-23 DIAGNOSIS — Z1231 Encounter for screening mammogram for malignant neoplasm of breast: Secondary | ICD-10-CM

## 2012-03-01 ENCOUNTER — Other Ambulatory Visit (HOSPITAL_COMMUNITY): Payer: Self-pay | Admitting: Obstetrics & Gynecology

## 2012-03-01 DIAGNOSIS — R1900 Intra-abdominal and pelvic swelling, mass and lump, unspecified site: Secondary | ICD-10-CM

## 2012-03-02 ENCOUNTER — Encounter (INDEPENDENT_AMBULATORY_CARE_PROVIDER_SITE_OTHER): Payer: Self-pay

## 2012-03-02 ENCOUNTER — Encounter (HOSPITAL_COMMUNITY): Payer: Self-pay | Admitting: Anesthesiology

## 2012-03-02 ENCOUNTER — Ambulatory Visit (HOSPITAL_COMMUNITY)
Admission: RE | Admit: 2012-03-02 | Discharge: 2012-03-02 | Disposition: A | Discharge status: Home / Self Care | Payer: Medicare Other | Source: Ambulatory Visit | Attending: Obstetrics & Gynecology | Admitting: Obstetrics & Gynecology

## 2012-03-02 ENCOUNTER — Encounter (INDEPENDENT_AMBULATORY_CARE_PROVIDER_SITE_OTHER): Payer: Self-pay | Admitting: Surgery

## 2012-03-02 DIAGNOSIS — K5730 Diverticulosis of large intestine without perforation or abscess without bleeding: Secondary | ICD-10-CM | POA: Insufficient documentation

## 2012-03-02 DIAGNOSIS — R1900 Intra-abdominal and pelvic swelling, mass and lump, unspecified site: Secondary | ICD-10-CM | POA: Insufficient documentation

## 2012-03-02 DIAGNOSIS — R634 Abnormal weight loss: Secondary | ICD-10-CM | POA: Insufficient documentation

## 2012-03-02 DIAGNOSIS — J984 Other disorders of lung: Secondary | ICD-10-CM | POA: Insufficient documentation

## 2012-03-02 DIAGNOSIS — I517 Cardiomegaly: Secondary | ICD-10-CM | POA: Insufficient documentation

## 2012-03-02 DIAGNOSIS — K921 Melena: Secondary | ICD-10-CM | POA: Insufficient documentation

## 2012-03-02 DIAGNOSIS — Z950 Presence of cardiac pacemaker: Secondary | ICD-10-CM | POA: Insufficient documentation

## 2012-03-02 DIAGNOSIS — R188 Other ascites: Secondary | ICD-10-CM | POA: Insufficient documentation

## 2012-03-02 MED ORDER — IOHEXOL 300 MG/ML  SOLN
100.0000 mL | Freq: Once | INTRAMUSCULAR | Status: AC | PRN
  Administered 2012-03-02: 100 mL via INTRAVENOUS

## 2012-03-08 ENCOUNTER — Telehealth: Payer: Self-pay | Admitting: *Deleted

## 2012-03-08 ENCOUNTER — Encounter (INDEPENDENT_AMBULATORY_CARE_PROVIDER_SITE_OTHER): Payer: Self-pay | Admitting: Surgery

## 2012-03-08 ENCOUNTER — Ambulatory Visit (INDEPENDENT_AMBULATORY_CARE_PROVIDER_SITE_OTHER): Payer: Medicare Other | Admitting: Surgery

## 2012-03-08 VITALS — BP 126/68 | HR 72 | Temp 97.0°F | Resp 16 | Ht 61.0 in | Wt 132.0 lb

## 2012-03-08 DIAGNOSIS — C20 Malignant neoplasm of rectum: Secondary | ICD-10-CM | POA: Insufficient documentation

## 2012-03-08 NOTE — Progress Notes (Signed)
General Surgery [facility]  Chief Complaint  Patient presents with  . New Evaluation    eval rectal tumor - referral from Dr. Konrad Dolores, primary care is Dr. Brunilda Payor, [facility]    HISTORY: Patient is an 78 year old white female referred by her gynecologist for suspected rectal carcinoma.  Patient first noted a change in the quantity and quality of her bowel movements in May 2013. She reported having 4-5 bowel movements per day. She was seen by her primary care physician and underwent a barium enema study on Dec 22, 2011. This showed a redundant colon with moderate diverticulosis. Patient then developed rectal bleeding in June 2013. This was felt to be related to hemorrhoids and due to her ongoing treatment with Coumadin. Patient has continued to have intermittent problems with frequency and bleeding.  Patient was seen by her gynecologist and on pelvic examination was noted to have a rectal mass. Subsequent CT scan of the abdomen and pelvis was obtained on March 02, 2012. This demonstrated a large 6.7 cm rectal mass as well as significant retroperitoneal lymphadenopathy suspicious for rectal carcinoma with nodal metastasis.  Patient is now referred our practice for further evaluation and management.  Past Medical History  Diagnosis Date  . CAD (coronary artery disease)   . Hypertension   . Atrial fibrillation   . Hyperlipidemia      Current Outpatient Prescriptions  Medication Sig Dispense Refill  . diltiazem (DILACOR XR) 240 MG 24 hr capsule Take 240 mg by mouth daily.      . metoprolol tartrate (LOPRESSOR) 25 MG tablet Take 25 mg by mouth 2 (two) times daily.      . simvastatin (ZOCOR) 20 MG tablet Take 20 mg by mouth every evening.      . valsartan-hydrochlorothiazide (DIOVAN-HCT) 80-12.5 MG per tablet Take 1 tablet by mouth daily.      Marland Kitchen warfarin (COUMADIN) 5 MG tablet Take 5 mg by mouth daily.         Allergies  Allergen Reactions  .  Doxycycline   . Pindolol   . Vibramycin (Doxycycline Calcium)      No family history on file.   History   Social History  . Marital Status: Married    Spouse Name: N/A    Number of Children: N/A  . Years of Education: N/A   Social History Main Topics  . Smoking status: Never Smoker   . Smokeless tobacco: None  . Alcohol Use: No  . Drug Use: No  . Sexually Active:    Other Topics Concern  . None   Social History Narrative  . None     REVIEW OF SYSTEMS - PERTINENT POSITIVES ONLY: Intermittent bleeding with bowel movements. Frequent small bowel movements 4-5 times daily. Denies pain.  EXAM: Filed Vitals:   03/08/12 0831  BP: 126/68  Pulse: 72  Temp: 97 F (36.1 C)  Resp: 16    HEENT: normocephalic; pupils equal and reactive; sclerae clear; dentition good; mucous membranes moist NECK:  symmetric on extension; no palpable anterior or posterior cervical lymphadenopathy; no supraclavicular masses; no tenderness CHEST: clear to auscultation bilaterally without rales, rhonchi, or wheezes CARDIAC: regular rate and rhythm without significant murmur; peripheral pulses are full ABDOMEN: soft with mild distension; bowel sounds present; no mass; no hepatosplenomegaly; no hernia RECTAL: External exam of the anus appears normal. Digital examination showed normal anal tone. Just above the anal verge at approximately 2 cm there is a large mass in the  right wall of the rectum. This measures at least 5 cm in diameter. It appears to have an area of central ulceration. It is mildly tender. This is consistent with a large rectal carcinoma. EXT:  non-tender without edema; no deformity NEURO: no gross focal deficits; no sign of tremor   LABORATORY RESULTS: See [facility] ([facility]) for most recent results   RADIOLOGY RESULTS: See [facility] ([facility]pic) for most recent results   IMPRESSION: Suspected rectal carcinoma with nodal metastasis  PLAN: I discussed all the  above findings with the patient and her daughter-in-law in the office today. This appears to be an extensive rectal carcinoma with nodal metastasis.  We will make arrangements for her to be evaluated by gastroenterology. Her last colonoscopy was in 2006. She will require at least a flexible sigmoidoscopy with biopsy.  We will make arrangements for her to be evaluated by medical oncology. Consultation has been requested.  The patient can be presented at gastroenterology conference in the next few weeks. We will then make a decision regarding treatment options.  The patient and her daughter-in-law understand this plan and are in agreement.  Velora Heckler, MD, FACS General & Endocrine Surgery [facility]   Visit Diagnoses: 1. Rectal carcinoma     Primary Care Physician: Garlan Fillers, MD

## 2012-03-08 NOTE — Telephone Encounter (Signed)
Spoke with patient by phone re: referral from Dr. Gerrit Friends.  Explained role of navigator and gave contact phone numbers.  Explained to patient that Dr. Truett Perna also wants her to see Dr. Mitzi Hansen.  Explained to patient she would receive a call from the schedulers with appointments for Dr. Truett Perna and Mitzi Hansen.  Patient verbalized understanding and was without questions.

## 2012-03-08 NOTE — Patient Instructions (Signed)
Will schedule appt with [facility] for exam and biopsy.  Will schedule [facility]t with [facility] for consult.  Velora Heckler, MD, [facility] Office: [phone]

## 2012-03-09 ENCOUNTER — Ambulatory Visit (INDEPENDENT_AMBULATORY_CARE_PROVIDER_SITE_OTHER): Payer: Medicare Other | Admitting: Physician Assistant

## 2012-03-09 ENCOUNTER — Telehealth: Payer: Self-pay | Admitting: *Deleted

## 2012-03-09 ENCOUNTER — Encounter: Payer: Self-pay | Admitting: Physician Assistant

## 2012-03-09 ENCOUNTER — Telehealth: Payer: Self-pay | Admitting: Oncology

## 2012-03-09 ENCOUNTER — Ambulatory Visit
Admission: RE | Admit: 2012-03-09 | Discharge: 2012-03-09 | Disposition: A | Discharge status: Home / Self Care | Payer: Medicare Other | Source: Ambulatory Visit | Attending: Internal Medicine | Admitting: Internal Medicine

## 2012-03-09 VITALS — BP 142/74 | HR 68 | Ht 60.0 in | Wt 132.4 lb

## 2012-03-09 DIAGNOSIS — K5730 Diverticulosis of large intestine without perforation or abscess without bleeding: Secondary | ICD-10-CM

## 2012-03-09 DIAGNOSIS — Z950 Presence of cardiac pacemaker: Secondary | ICD-10-CM

## 2012-03-09 DIAGNOSIS — R198 Other specified symptoms and signs involving the digestive system and abdomen: Secondary | ICD-10-CM

## 2012-03-09 DIAGNOSIS — Z7901 Long term (current) use of anticoagulants: Secondary | ICD-10-CM

## 2012-03-09 DIAGNOSIS — I4891 Unspecified atrial fibrillation: Secondary | ICD-10-CM

## 2012-03-09 DIAGNOSIS — Z1231 Encounter for screening mammogram for malignant neoplasm of breast: Secondary | ICD-10-CM

## 2012-03-09 DIAGNOSIS — I2510 Atherosclerotic heart disease of native coronary artery without angina pectoris: Secondary | ICD-10-CM

## 2012-03-09 DIAGNOSIS — K5790 Diverticulosis of intestine, part unspecified, without perforation or abscess without bleeding: Secondary | ICD-10-CM

## 2012-03-09 DIAGNOSIS — K6289 Other specified diseases of anus and rectum: Secondary | ICD-10-CM

## 2012-03-09 DIAGNOSIS — I480 Paroxysmal atrial fibrillation: Secondary | ICD-10-CM | POA: Insufficient documentation

## 2012-03-09 NOTE — Telephone Encounter (Signed)
S/w pt re appt for 8/23 @ 2:30 pm w/BS.

## 2012-03-09 NOTE — Patient Instructions (Addendum)
We have scheduled the Flexible Sigmoidoscopy with Dr. Yancey Flemings on 03-13-2012. We are sending a letter to your cardiologist to inform of that we advised you to stop the Coumadin starting today and resume it the day after the procedure on the 19th,.   Flexible Sigmoidoscopy Your caregiver has ordered a flexible sigmoidoscopy. This is an exam to evaluate your lower colon. In this exam your colon is cleansed and a short fiber optic tube is inserted through your rectum and into your colon. The fiber optic scope (endoscope) is a short bundle of enclosed flexible small glass fibers. It transmits light to the area examined and images from that area to your caregiver. You do not have to worry about glass breakage in the endoscope. Discomfort is usually minimal. Sedatives and pain medications are generally not required. This exam helps to detect tumors (lumps), polyps, inflammation (swelling and soreness), and areas of bleeding. It may also be used to take biopsies. These are small pieces of tissue taken to examine under a microscope. LET YOUR CAREGIVER KNOW ABOUT:  Allergies.   Medications taken including herbs, eye drops, over the counter medications, and creams.   Use of steroids (by mouth or creams).   Previous problems with anesthetics or novocaine   Possibility of pregnancy, if this applies.   History of blood clots (thrombophlebitis).   History of bleeding or blood problems.   Previous surgery.   Other health problems.  BEFORE THE PROCEDURE Eat normally the night before the exam. Your caregiver may order a mild enema or laxative the night before. No eating or drinking should occur after midnight until the procedure is completed. A rectal suppository or enemas may be given in the morning prior to your procedure. You will be brought to the examination area in a hospital gown. You should be present 60 minutes prior to your procedure or as directed.  AFTER THE PROCEDURE   There is sometimes a  little blood passed with the first bowel movement. Do not be concerned. Because air is often used during the exam, it is not unusual to pass gas and experience abdominal (belly) cramping. Walking or a warm pack on your abdomen may help with this. Do not sleep with a heating pad as burns can occur.   You may resume all normal eating and activities.   Only take over-the-counter or prescription medicines for pain, discomfort, or fever as directed by your caregiver. Do not use aspirin or blood thinners if a biopsy (tissue sample) was taken. Consult your caregiver for medication usage if biopsies were taken.   Call for your results as instructed by your caregiver. Remember, it is your responsibility to obtain the results of your biopsy. Do not assume everything is fine because you do not hear from your caregiver.  SEEK IMMEDIATE MEDICAL CARE IF:  An oral temperature above 102 F (38.9 C) develops.   You pass large blood clots or fill a toilet with blood following the procedure. This may also occur 10 to 14 days following the procedure. It is more likely if a biopsy was taken.   You develop abdominal pain not relieved with medication or that is getting worse rather than better.  Document Released: 07/09/2000 Document Revised: 07/01/2011 Document Reviewed: 04/21/2005 [facility] Patient Information 2012 [place], [place].

## 2012-03-09 NOTE — Progress Notes (Signed)
Subjective:    Patient ID: Victoria [name], female    DOB: 10/18/1926, 78 y.o.   MRN: [id number]  HPI Victoria [name] is a very nice 78 year old female known previously to Dr. Terrial Rhodes who had undergone colonoscopy in 2006 for screening, this showed scattered diverticulosis but was otherwise unremarkable. Patient is referred here today by Dr. Gerrit Friends for colonoscopy and biopsies. Patient apparently he first noted a change in her bowel habits in May of 2013 with an increased frequency of bowel movements to 4-5 per day. She was seen by her primary care physician underwent a barium enema which showed a long redundant colon and moderate diverticulosis. She then developed some rectal bleeding with bright red blood in June of 2013. She says in the interim she also been experiencing a lot of abdominal gas and some fullness in her lower abdomen which was persistent. She is maintained on chronic Coumadin for atrial fibrillation, her bleeding was felt possibly to be hemorrhoidal and she was told to stop the Coumadin for about a week which she did and the bleeding stopped. Since that time she has continued to have increased frequency of bowel movements intermittent. Low-grade bleeding and complaints of abdominal distention and fullness. She was evaluated by her gynecologist Dr. Jennette Kettle and on pelvic exam was noted to have a rectal mass. Subsequent CT scan of the abdomen and pelvis on 03/01/2012, and this revealed a 5.3 x 6.7 x 7.6 cm mass along the right rectum without definite wall thickening raising the question of serosal lesion. She's also noted to have 2 large and adjacent nodal masses in the right midabdomen in conglomeration measuring 8.9 x 7.4 x 7.2 cm. There is also a third similar metastatic lesion posterior to the cecum. Is not clear whether this represents a primary rectal adenocarcinoma or serosal metastatic disease. She has trace ascites; no adnexal mass noted   Review of Systems  Constitutional: Positive for  appetite change.  HENT: Negative.   Eyes: Negative.   Respiratory: Negative.   Cardiovascular: Negative.   Gastrointestinal: Positive for abdominal pain, blood in stool and abdominal distention.  Genitourinary: Negative.   Musculoskeletal: Negative.   Skin: Negative.   Neurological: Negative.   Hematological: Negative.   Psychiatric/Behavioral: Negative.    Outpatient Prescriptions Prior to Visit  Medication Sig Dispense Refill  . diltiazem (DILACOR XR) 240 MG 24 hr capsule Take 240 mg by mouth daily.      . metoprolol tartrate (LOPRESSOR) 25 MG tablet Take 25 mg by mouth 2 (two) times daily.      . simvastatin (ZOCOR) 20 MG tablet Take 20 mg by mouth every evening.      . warfarin (COUMADIN) 5 MG tablet Take 5 mg by mouth daily.      . valsartan-hydrochlorothiazide (DIOVAN-HCT) 80-12.5 MG per tablet Take 1 tablet by mouth daily.       Allergies  Allergen Reactions  . Doxycycline   . Pindolol   . Vibramycin (Doxycycline Calcium)        Patient Active Problem List  Diagnosis  . Rectal carcinoma  . Atrial fibrillation  . Chronic anticoagulation  . CAD (coronary artery disease)  . Diverticulosis  . S/P placement of cardiac pacemaker   History   Social History  . Marital Status: Married    Spouse Name: N/A    Number of Children: 2  . Years of Education: N/A   Occupational History  . Retired    Social History Main Topics  . Smoking status: Never  Smoker   . Smokeless tobacco: Never Used  . Alcohol Use: No  . Drug Use: No  . Sexually Active: Not on file   Other Topics Concern  . Not on file   Social History Narrative  . No narrative on file     Objective:   Physical Exam well-developed elderly white female in no acute distress, pleasant accompanied by her daughter. Blood pressure 142/74 pulse 68 height 5 foot weight 132. HEENT; nontraumatic normocephalic EOMI PERRLA sclera anicteric, Neck ;supple no JVD, Cardiovascular; regular rate and rhythm with S1-S2 no  murmur or gallop, Pulmonary ;clear bilaterally, Abdomen; soft bowel sounds are active, somewhat hyperactive she is mildly distended with some fullness in the suprapubic area, no definite palpable mass no hepatosplenomegaly, Rectal; exam not done today. She was examined by Dr. Darnell Level yesterday with finding of a large mass in the right wall of the rectum just above the anal verge this is felt to measure at least 5 cm in diameter and has a central ulceration . Extremities ;no clubbing cyanosis or edema skin warm and dry, Psych; mood and affect normal and appropriate        Assessment & Plan:  #26 78 year old female on chronic Coumadin for atrial fibrillation with complaints of intermittent rectal bleeding change in bowel habits with increased frequency of stools and lower abdominal discomfort with distention over the past couple of months. CT scan showing a large right rectal mass measuring 5.3 x 6.7 x 7.4 cm, and adjacent nodal masses in the right midabdomen and posterior to the cecum . Felt somewhat atypical for primary adenocarcinoma of the rectum, per radiology may also reflect  serosal metastatic disease. #2 atrial fibrillation #3 coronary artery disease #4 s/p pacemaker  Plan; patient is scheduled for flexible sigmoidoscopy with biopsies of the rectal mass on August 19 with Dr. Marina Goodell. Procedure discussed in detail with the patient and her daughter and she is agreeable to proceed. She will stop her Coumadin today in anticipation of the procedure. We will send a consent letter to her cardiologist as well([facility]), and will anticipate resuming the Coumadin post procedure. Referrals to radiation oncology and oncology are in progress and being arranged through Dr. Gerrit Friends

## 2012-03-09 NOTE — Progress Notes (Signed)
Patient seen. Daughter-in-law and room. Agree with assessment and plan as outlined. I spoke with Dr. Gerrit Friends yesterday. Flexible sigmoidoscopy with biopsies on Monday.

## 2012-03-09 NOTE — Telephone Encounter (Signed)
  03/09/2012    RE: Victoria [name] DOB: 02-Jun-1927 MRN: [id number]   Dear Dr. Royann Shivers,    We have scheduled the above patient for an endoscopic procedure. Our records show that she is on anticoagulation therapy.   Please advise as to how long the patient may come off her therapy of Coumadin prior to the procedure, which is scheduled for Monday 03-13-2012.  Please fax back/ or route the completed form to [facility] CMA at [phone].   Sincerely,  Ashby Dawes   Amy Cunard PA

## 2012-03-10 ENCOUNTER — Other Ambulatory Visit: Payer: Self-pay | Admitting: Internal Medicine

## 2012-03-10 ENCOUNTER — Telehealth: Payer: Self-pay | Admitting: Oncology

## 2012-03-10 ENCOUNTER — Telehealth: Payer: Self-pay | Admitting: *Deleted

## 2012-03-10 DIAGNOSIS — R928 Other abnormal and inconclusive findings on diagnostic imaging of breast: Secondary | ICD-10-CM

## 2012-03-10 NOTE — Telephone Encounter (Signed)
Left message on patient's phone that we got an answer from Dr. Royann Shivers from [facility] that the patient can come off coumadin 5 days.  He agreed it is okay for the pt to stay off coumadin until after the procedure.  I left a message to have the patient call us if she had a question.

## 2012-03-10 NOTE — Telephone Encounter (Signed)
I left a message to let the patient know that Dr. Royann Shivers got back to [place] and signed the letter that the patient can stay off the Coumadin 5 days prior to the procedure.  I advised if she had any questions to call me at [phone].

## 2012-03-10 NOTE — Telephone Encounter (Signed)
NP packet mailed out.  DX- Rectal

## 2012-03-13 ENCOUNTER — Ambulatory Visit (AMBULATORY_SURGERY_CENTER): Payer: Medicare Other | Admitting: Internal Medicine

## 2012-03-13 ENCOUNTER — Encounter: Payer: Self-pay | Admitting: Internal Medicine

## 2012-03-13 VITALS — BP 128/70 | HR 66 | Temp 99.0°F | Resp 15 | Ht 60.0 in | Wt 132.0 lb

## 2012-03-13 DIAGNOSIS — K6289 Other specified diseases of anus and rectum: Secondary | ICD-10-CM

## 2012-03-13 DIAGNOSIS — C20 Malignant neoplasm of rectum: Secondary | ICD-10-CM

## 2012-03-13 DIAGNOSIS — R198 Other specified symptoms and signs involving the digestive system and abdomen: Secondary | ICD-10-CM

## 2012-03-13 MED ORDER — SODIUM CHLORIDE 0.9 % IV SOLN: 500.0000 mL | INTRAVENOUS | Status: DC

## 2012-03-13 NOTE — Patient Instructions (Signed)
Oncology follow up. Resume current medications. Biopsies taken today. Call with any questions or concerns. Thank you!!  YOU HAD AN ENDOSCOPIC PROCEDURE TODAY AT THE [facility]: Refer to the procedure report that was given to you for any specific questions about what was found during the examination.  If the procedure report does not answer your questions, please call your gastroenterologist to clarify.  If you requested that your care partner not be given the details of your procedure findings, then the procedure report has been included in a sealed envelope for you to review at your convenience later.  YOU SHOULD EXPECT: Some feelings of bloating in the abdomen. Passage of more gas than usual.  Walking can help get rid of the air that was put into your GI tract during the procedure and reduce the bloating. If you had a lower endoscopy (such as a colonoscopy or flexible sigmoidoscopy) you may notice spotting of blood in your stool or on the toilet paper. If you underwent a bowel prep for your procedure, then you may not have a normal bowel movement for a few days.  DIET: Your first meal following the procedure should be a light meal and then it is ok to progress to your normal diet.  A half-sandwich or bowl of soup is an example of a good first meal.  Heavy or fried foods are harder to digest and may make you feel nauseous or bloated.  Likewise meals heavy in dairy and vegetables can cause extra gas to form and this can also increase the bloating.  Drink plenty of fluids but you should avoid alcoholic beverages for 24 hours.  ACTIVITY: Your care partner should take you home directly after the procedure.  You should plan to take it easy, moving slowly for the rest of the day.  You can resume normal activity the day after the procedure however you should NOT DRIVE or use heavy machinery for 24 hours (because of the sedation medicines used during the test).    SYMPTOMS TO REPORT IMMEDIATELY: A  gastroenterologist can be reached at any hour.  During normal business hours, 8:30 AM to 5:00 PM Monday through Friday, call [phone].  After hours and on weekends, please call the GI answering service at [phone] who will take a message and have the physician on call contact you.   Following lower endoscopy (colonoscopy or flexible sigmoidoscopy):  Excessive amounts of blood in the stool  Significant tenderness or worsening of abdominal pains  Swelling of the abdomen that is new, acute  Fever of 100F or higher  FOLLOW UP: If any biopsies were taken you will be contacted by phone or by letter within the next 1-3 weeks.  Call your gastroenterologist if you have not heard about the biopsies in 3 weeks.  Our staff will call the home number listed on your records the next business day following your procedure to check on you and address any questions or concerns that you may have at that time regarding the information given to you following your procedure. This is a courtesy call and so if there is no answer at the home number and we have not heard from you through the emergency physician on call, we will assume that you have returned to your regular daily activities without incident.  SIGNATURES/CONFIDENTIALITY: You and/or your care partner have signed paperwork which will be entered into your electronic medical record.  These signatures attest to the fact that that the information above on  your After Visit Summary has been reviewed and is understood.  Full responsibility of the confidentiality of this discharge information lies with you and/or your care-partner.

## 2012-03-13 NOTE — Progress Notes (Signed)
Patient did not experience any of the following events: a burn prior to discharge; a fall within the facility; wrong site/side/patient/procedure/implant event; or a hospital transfer or hospital admission upon discharge from the facility. (G8907) Patient did not have preoperative order for IV antibiotic SSI prophylaxis. (G8918)  

## 2012-03-13 NOTE — Op Note (Signed)
[facility] [address] [place], [address]   FLEXIBLE SIGMOIDOSCOPY PROCEDURE REPORT  PATIENT: Victoria, [name]  MR#: [id number] BIRTHDATE: 10/02/1926 , 84  yrs. old GENDER: Female ENDOSCOPIST: Roxy Cedar, MD REFERRED BY: Darnell Level, M.D. PROCEDURE DATE:  03/13/2012 PROCEDURE:   Sigmoidoscopy with biopsies ASA CLASS:   Class II INDICATIONS:rectal mass on CT and exam. MEDICATIONS: mg  DESCRIPTION OF PROCEDURE:   After the risks benefits and alternatives of the procedure were thoroughly explained, informed consent was obtained.  revealed a palpable rectal mass. The [id number] endoscope was introduced through the anus  and advanced to the sigmoid colon , limited by No adverse events experienced.   The quality of the prep was fair .  The instrument was then slowly withdrawn as the mucosa was fully examined.       COLON FINDINGS: A 5cm heaped up malignant appearring mass with central ulceration was found in the rectum about 2-3 cm proximal to anal verge. The scope was then withdrawn from the patient and the procedure terminated.  COMPLICATIONS: There were no complications.  ENDOSCOPIC IMPRESSION: Mass were found in the rectum  RECOMMENDATIONS: 1. follow up biopsies 2. Oncology follow up     _______________________________ eSigned:  Roxy Cedar, MD 03/13/2012 8:47 AM   CC:The Patient , Doree Albee; Dossie Arbour, MD

## 2012-03-14 ENCOUNTER — Encounter: Payer: Self-pay | Admitting: *Deleted

## 2012-03-14 ENCOUNTER — Telehealth: Payer: Self-pay | Admitting: *Deleted

## 2012-03-14 DIAGNOSIS — C189 Malignant neoplasm of colon, unspecified: Secondary | ICD-10-CM | POA: Insufficient documentation

## 2012-03-14 NOTE — Telephone Encounter (Signed)
  Follow up Call-  Call back number 03/13/2012  Post procedure Call Back phone  # [phone]  Permission to leave phone message Yes     Patient questions:  Do you have a fever, pain , or abdominal swelling? no Pain Score  0 *  Have you tolerated food without any problems? yes  Have you been able to return to your normal activities? yes  Do you have any questions about your discharge instructions: Diet   no Medications  no Follow up visit  no  Do you have questions or concerns about your Care? no  Actions: * If pain score is 4 or above: No action needed, pain <4.

## 2012-03-15 ENCOUNTER — Telehealth: Payer: Self-pay | Admitting: *Deleted

## 2012-03-15 ENCOUNTER — Ambulatory Visit
Admission: RE | Admit: 2012-03-15 | Discharge: 2012-03-15 | Disposition: A | Discharge status: Home / Self Care | Payer: Medicare Other | Source: Ambulatory Visit | Attending: Radiation Oncology | Admitting: Radiation Oncology

## 2012-03-15 ENCOUNTER — Encounter: Payer: Self-pay | Admitting: Radiation Oncology

## 2012-03-15 ENCOUNTER — Telehealth: Payer: Self-pay | Admitting: Internal Medicine

## 2012-03-15 ENCOUNTER — Encounter: Payer: Self-pay | Admitting: *Deleted

## 2012-03-15 VITALS — BP 123/63 | HR 78 | Temp 97.7°F | Resp 20 | Wt 134.3 lb

## 2012-03-15 DIAGNOSIS — C20 Malignant neoplasm of rectum: Secondary | ICD-10-CM

## 2012-03-15 DIAGNOSIS — C801 Malignant (primary) neoplasm, unspecified: Secondary | ICD-10-CM | POA: Insufficient documentation

## 2012-03-15 DIAGNOSIS — I10 Essential (primary) hypertension: Secondary | ICD-10-CM | POA: Insufficient documentation

## 2012-03-15 DIAGNOSIS — Z79899 Other long term (current) drug therapy: Secondary | ICD-10-CM | POA: Insufficient documentation

## 2012-03-15 DIAGNOSIS — R198 Other specified symptoms and signs involving the digestive system and abdomen: Secondary | ICD-10-CM | POA: Insufficient documentation

## 2012-03-15 DIAGNOSIS — E785 Hyperlipidemia, unspecified: Secondary | ICD-10-CM | POA: Insufficient documentation

## 2012-03-15 DIAGNOSIS — I2510 Atherosclerotic heart disease of native coronary artery without angina pectoris: Secondary | ICD-10-CM | POA: Insufficient documentation

## 2012-03-15 DIAGNOSIS — Z7901 Long term (current) use of anticoagulants: Secondary | ICD-10-CM | POA: Insufficient documentation

## 2012-03-15 DIAGNOSIS — Z950 Presence of cardiac pacemaker: Secondary | ICD-10-CM | POA: Insufficient documentation

## 2012-03-15 DIAGNOSIS — C189 Malignant neoplasm of colon, unspecified: Secondary | ICD-10-CM

## 2012-03-15 DIAGNOSIS — C772 Secondary and unspecified malignant neoplasm of intra-abdominal lymph nodes: Secondary | ICD-10-CM | POA: Insufficient documentation

## 2012-03-15 NOTE — Progress Notes (Signed)
Please see the Nurse Progress Note in the MD Initial Consult Encounter for this patient. 

## 2012-03-15 NOTE — Addendum Note (Signed)
Encounter addended by: Lowella Petties, RN on: 03/15/2012  3:01 PM<BR>     Documentation filed: Charges VN

## 2012-03-15 NOTE — Telephone Encounter (Signed)
Discussed with pt that results are not back. Let her know we will call her when the results are back.

## 2012-03-15 NOTE — Telephone Encounter (Signed)
Briefly met with patient and family while waiting to see Dr. Mitzi Hansen.  Introduced self and confirmed appointment time with Dr. Truett Perna.  Patient was very Adult nurse.  Will continue to follow.

## 2012-03-15 NOTE — Progress Notes (Signed)
New Consult  Rectal mass biopsied on  03/13/12  By Dr.John Perry,Jr.MD Suspected Rectal Carcinoma with nodal metastasis   Married, alert,oriented x3,  2 sons,  Daughter-inlaw and granddaughter in with patient,  grandaughter is a Engineer, civil (consulting)  gso pediatrician    Patient has  [employer] permanent transvenous pacemaker Placed 09/10/2008  had enema by herself this past Monday, had some rectal bleeding, small amount Last bowel movement, 5x this morning, loose stools, some normal, no blood today   Allergies: Iodine, Pindolol , Vibramycin,Doxycycline-hyclate,All shellfish

## 2012-03-16 ENCOUNTER — Telehealth: Payer: Self-pay | Admitting: *Deleted

## 2012-03-16 NOTE — Telephone Encounter (Signed)
CALLED PATIENT TO INFORM OF TEST FOR 03/22/12, SPOKE WITH PATIENT AND SHE IS AWARE OF THIS TEST

## 2012-03-16 NOTE — Progress Notes (Signed)
Radiation Oncology         [phone] ________________________________  Name: Victoria [name] MRN: [id number]  Date: 03/15/2012  DOB: 01-10-1927  GN:[id number],VHQION Reece Agar, MD  Ladene Artist, MD   Darnell Level, MD  REFERRING PHYSICIAN: Ladene Artist, MD   DIAGNOSIS: The primary encounter diagnosis was Colon cancer. A diagnosis of Rectal carcinoma was also pertinent to this visit.   HISTORY OF PRESENT ILLNESS::Victoria [name] is a 78 y.o. female who is seen for an initial consultation visit. She is a pleasant 78 year old female who indicates that she had noticed some changes in bowel movements for several months. This had increased to 4-5 bowel movements per day. She had begun managing this to some degree with medications such as Imodium. She also noticed some change in gas as well. The patient also began noticing some rectal bleeding in June of 2012. She felt that this was related to hemorrhoids in combination with her treatment with Coumadin.  The patient underwent further workup for this when she was evaluated by her gynecologist to notice a rectal mass on exam and was concerned about the possibility of rectal carcinoma. She proceeded to undergo a CT scan of the abdomen and pelvis. This was completed on 03/02/2012. There was a 5.3 x 6.7 x 6.1 cm mass along the right aspect of the rectum. This looks somewhat atypical for her tumor a rising from the mucosa. There were also 2 adjacent nodal masses within the right mid abdomen measuring 8.9 x 7.4 x 7.2 cm in aggregate. This was suspicious for nodal metastases. There was also an additional 3.0 x 3.0 x 2.6 cm nodal metastasis posterior to the cecum.  The patient underwent a flexible sigmoidoscopy with biopsy on 03/13/2012. There is a 5 cm mass within the rectum beginning at about 2-3 cm proximal to the anal verge. Central ulcer is a patient was present. The biopsy results are pending.   PREVIOUS RADIATION THERAPY: No   PAST MEDICAL HISTORY:   has a past medical history of CAD (coronary artery disease); Hypertension; Atrial fibrillation; Hyperlipidemia; Rectal carcinoma; Colon cancer (03/13/12 egd); Diverticulosis; Allergy; and Anxiety.     PAST SURGICAL HISTORY: Past Surgical History  Procedure Date  . Pace maker 2010  . Rib removed 1934    Pleurisy and pneumonia  . Partial hysterectomy 1972  . Bladder neck suspension 1986  . Heart catherization [id number]  . Abdominal hysterectomy     both ovaries intact,      FAMILY HISTORY: family history includes Heart disease in her mother.   SOCIAL HISTORY:  reports that she has never smoked. She has never used smokeless tobacco. She reports that she does not drink alcohol or use illicit drugs.   ALLERGIES: Pindolol; Vibramycin; and Doxycycline   MEDICATIONS:  Current Outpatient Prescriptions  Medication Sig Dispense Refill  . diltiazem (DILACOR XR) 240 MG 24 hr capsule Take 240 mg by mouth daily.      . metoprolol tartrate (LOPRESSOR) 25 MG tablet Take 25 mg by mouth daily.       . simvastatin (ZOCOR) 20 MG tablet Take 20 mg by mouth every evening.      . warfarin (COUMADIN) 5 MG tablet Take 5 mg by mouth daily.      Marland Kitchen diltiazem (CARDIZEM CD) 240 MG 24 hr capsule       . metoprolol succinate (TOPROL-XL) 25 MG 24 hr tablet          REVIEW OF SYSTEMS:  A 15  point review of systems is documented in the electronic medical record. This was obtained by the nursing staff. However, I reviewed this with the patient to discuss relevant findings and make appropriate changes.  Pertinent items are noted in HPI.    PHYSICAL EXAM:  weight is 134 lb 4.8 oz (60.918 kg). Her oral temperature is 97.7 F (36.5 C). Her blood pressure is 123/63 and her pulse is 78. Her respiration is 20.   General: Well-developed, in no acute distress HEENT: Normocephalic, atraumatic; oral cavity clear Neck: Supple without any lymphadenopathy Cardiovascular: Regular rate and rhythm Respiratory: Clear to  auscultation bilaterally GI: Soft, nontender, normal bowel sounds Extremities: No edema present Neuro: No focal deficits Rectal exam: No external lesions/findings. An easily palpable mass present along the right aspect of the distal rectum, slightly posteriorly. No blood on exam glove.     LABORATORY DATA:  Lab Results  Component Value Date   WBC 5.1 09/11/2008   HGB 11.2* 09/11/2008   HCT 32.5* 09/11/2008   MCV 91.5 09/11/2008   PLT 167 09/11/2008   Lab Results  Component Value Date   NA 135 09/10/2008   K 3.8 09/10/2008   CL 104 09/10/2008   CO2 23 09/10/2008   Lab Results  Component Value Date   ALT 13 09/07/2008   AST 18 09/07/2008   ALKPHOS 66 09/07/2008   BILITOT 0.6 09/07/2008      RADIOGRAPHY: Ct Abdomen Pelvis W Contrast  03/02/2012  *RADIOLOGY REPORT*  Clinical Data: Pelvic mass, weight loss, blood in stool  CT ABDOMEN AND PELVIS WITH CONTRAST  Technique:  Multidetector CT imaging of the abdomen and pelvis was performed following the standard protocol during bolus administration of intravenous contrast.  Contrast: OMNIPAQUE IOHEXOL 300 MG/ML  SOLN  Comparison: [facility] CT abdomen pelvis dated 01/20/2006  Findings: Motion degraded images.  Scarring at the right lung base.  Cardiomegaly.  Pacemaker leads, incompletely visualized.  Liver, spleen, pancreas, and adrenal glands are within normal limits.  Gallbladder is unremarkable.  No intrahepatic or extrahepatic ductal dilatation.  Kidneys are unremarkable.  No hydronephrosis.  No evidence of bowel obstruction.  Colonic diverticulosis.  5.3 x 6.7 x 6.1 cm mass along the right aspect of the rectum (series 2/image 74).  Lesion may be serosal and is without definite associating wall thickening, although some underlying mucosal irregularity is suspected.  Two adjacent nodal masses in the right mid abdomen, measuring 8.9 x 7.4 x 7.2 cm in aggregate (series 2/image 58), suspicious for nodal metastases.  Additional 3.0 x 3.0 x  2.6 cm nodal metastasis posterior to the cecum (series 2/image 55).  Atherosclerotic calcifications of the abdominal aorta and branch vessels.  Trace pelvic ascites.  Status post hysterectomy.  No adnexal masses.  Bladder is within normal limits.  Degenerative changes of the visualized thoracolumbar spine.  IMPRESSION: 6.7 cm mass along the right aspect of the rectum, as described above.  While this may reflect primary colonic adenocarcinoma, the appearance is mildly atypical, and serosal metastasis remains possible.  Additional nodal metastases in the right mid abdomen, as described above.  Original Report Authenticated By: Charline Bills, M.D.   Mm Digital Screening  03/09/2012  *RADIOLOGY REPORT*  Clinical Data: Screening.  DIGITAL BILATERAL SCREENING MAMMOGRAM WITH CAD  Comparison:  Previous exams.  Findings: Two views of each breast demonstrate heterogeneously dense tissue .  In the left breast, calcifications warrant further evaluation with magnified views.  In the right breast, no mass or malignant type  calcifications are identified.  Images were processed with CAD.  IMPRESSION: Further evaluation is suggested for calcifications in the left breast.  RECOMMENDATION: Diagnostic mammogram of the left breast. (Code:FI-L-74M)  BI-RADS CATEGORY 0:  Incomplete.  Need additional imaging evaluation and/or prior mammograms for comparison.  Original Report Authenticated By: Hiram Gash, M.D.       IMPRESSION: 78 year old female with presumed rectal cancer. Biopsy pending. On imaging, and alternative differential in terms of possible serosal involvement was also entertained. Hopefully the biopsy results will allow a definitive answer in terms of whether this truly represents a rectal primary versus other alternatives. Had a chance to personally review the patient's imaging. The lymph node areas of involvement are outside traditional radiotherapy fields to the pelvis for a rectal cancer and formally I  would classify these as non-regional lymph nodes, especially the lymph node adjacent to the cecum, which would yield a diagnosis of stage IV disease.   PLAN: The biopsy results are pending and certainly this will be helpful to review. I do believe a PET scan would be helpful for her case and I will go ahead and order this. The patient is scheduled to see Dr. Truett Perna in 2 days.  I did discuss with the patient and her family the potential role of radiotherapy in the setting of rectal cancer. We discussed the use of this both as a definitive modality versus for palliative purposes. They're aware that radiation can be given on a daily basis for up to 5-1/2-6 weeks and what the typical side effects are that we would expect.   Additional information will be very helpful and I believe both the biopsy results and the PET scan results will help to shed more light on her presentation. It may also be very helpful for the patient to be presented at the multidisciplinary GI conference next week.   I will therefore followup on her upcoming results and consultations and I look forward to seeing her back in clinic if radiotherapy is deemed appropriate. Certainly the patient has had some significant changes in bowel movements and palliative radiotherapy to at least the rectal tumor would be a consideration - potentially also to the larger abdominal nodal masses as these are quite bulky as well, although further discussion along these lines will be very helpful for her case.   I spent 60 minutes minutes face to face with the patient and more than 50% of that time was spent in counseling and/or coordination of care.    ________________________________   Radene Gunning, MD, PhD

## 2012-03-17 ENCOUNTER — Other Ambulatory Visit: Payer: Self-pay | Admitting: *Deleted

## 2012-03-17 ENCOUNTER — Other Ambulatory Visit: Payer: Medicare Other | Admitting: Lab

## 2012-03-17 ENCOUNTER — Ambulatory Visit: Payer: Medicare Other | Admitting: Oncology

## 2012-03-17 ENCOUNTER — Ambulatory Visit: Payer: Medicare Other

## 2012-03-17 NOTE — Addendum Note (Signed)
Encounter addended by: Rolla Kedzierski Mintz Chasiti Waddington, RN on: 03/17/2012  7:01 PM<BR>     Documentation filed: Charges VN

## 2012-03-20 ENCOUNTER — Observation Stay (HOSPITAL_COMMUNITY)
Admission: AD | Admit: 2012-03-20 | Discharge: 2012-03-21 | Disposition: A | Discharge status: Home / Self Care | Payer: Medicare Other | Source: Ambulatory Visit | Attending: Internal Medicine | Admitting: Internal Medicine

## 2012-03-20 ENCOUNTER — Encounter (HOSPITAL_COMMUNITY): Payer: Self-pay | Admitting: *Deleted

## 2012-03-20 ENCOUNTER — Observation Stay (HOSPITAL_COMMUNITY): Payer: Medicare Other

## 2012-03-20 DIAGNOSIS — R5381 Other malaise: Secondary | ICD-10-CM | POA: Insufficient documentation

## 2012-03-20 DIAGNOSIS — E871 Hypo-osmolality and hyponatremia: Secondary | ICD-10-CM | POA: Insufficient documentation

## 2012-03-20 DIAGNOSIS — Z7901 Long term (current) use of anticoagulants: Secondary | ICD-10-CM

## 2012-03-20 DIAGNOSIS — C20 Malignant neoplasm of rectum: Secondary | ICD-10-CM

## 2012-03-20 DIAGNOSIS — D649 Anemia, unspecified: Secondary | ICD-10-CM

## 2012-03-20 DIAGNOSIS — R059 Cough, unspecified: Secondary | ICD-10-CM | POA: Insufficient documentation

## 2012-03-20 DIAGNOSIS — E860 Dehydration: Secondary | ICD-10-CM | POA: Diagnosis present

## 2012-03-20 DIAGNOSIS — I2510 Atherosclerotic heart disease of native coronary artery without angina pectoris: Secondary | ICD-10-CM

## 2012-03-20 DIAGNOSIS — K5669 Other intestinal obstruction: Principal | ICD-10-CM | POA: Diagnosis present

## 2012-03-20 DIAGNOSIS — Z950 Presence of cardiac pacemaker: Secondary | ICD-10-CM

## 2012-03-20 DIAGNOSIS — D72829 Elevated white blood cell count, unspecified: Secondary | ICD-10-CM | POA: Diagnosis present

## 2012-03-20 DIAGNOSIS — C801 Malignant (primary) neoplasm, unspecified: Secondary | ICD-10-CM | POA: Diagnosis present

## 2012-03-20 DIAGNOSIS — D62 Acute posthemorrhagic anemia: Secondary | ICD-10-CM | POA: Diagnosis present

## 2012-03-20 DIAGNOSIS — E46 Unspecified protein-calorie malnutrition: Secondary | ICD-10-CM | POA: Diagnosis present

## 2012-03-20 DIAGNOSIS — I10 Essential (primary) hypertension: Secondary | ICD-10-CM | POA: Insufficient documentation

## 2012-03-20 DIAGNOSIS — I4891 Unspecified atrial fibrillation: Secondary | ICD-10-CM | POA: Insufficient documentation

## 2012-03-20 DIAGNOSIS — R5383 Other fatigue: Secondary | ICD-10-CM | POA: Insufficient documentation

## 2012-03-20 DIAGNOSIS — K560 Paralytic ileus: Secondary | ICD-10-CM | POA: Diagnosis not present

## 2012-03-20 DIAGNOSIS — K5790 Diverticulosis of intestine, part unspecified, without perforation or abscess without bleeding: Secondary | ICD-10-CM

## 2012-03-20 DIAGNOSIS — R42 Dizziness and giddiness: Secondary | ICD-10-CM | POA: Insufficient documentation

## 2012-03-20 DIAGNOSIS — Z79899 Other long term (current) drug therapy: Secondary | ICD-10-CM | POA: Insufficient documentation

## 2012-03-20 DIAGNOSIS — R05 Cough: Secondary | ICD-10-CM | POA: Insufficient documentation

## 2012-03-20 DIAGNOSIS — R197 Diarrhea, unspecified: Secondary | ICD-10-CM | POA: Insufficient documentation

## 2012-03-20 DIAGNOSIS — C189 Malignant neoplasm of colon, unspecified: Secondary | ICD-10-CM

## 2012-03-20 DIAGNOSIS — I480 Paroxysmal atrial fibrillation: Secondary | ICD-10-CM | POA: Diagnosis present

## 2012-03-20 DIAGNOSIS — K219 Gastro-esophageal reflux disease without esophagitis: Secondary | ICD-10-CM | POA: Insufficient documentation

## 2012-03-20 DIAGNOSIS — E785 Hyperlipidemia, unspecified: Secondary | ICD-10-CM | POA: Insufficient documentation

## 2012-03-20 LAB — COMPREHENSIVE METABOLIC PANEL
Alkaline Phosphatase: 153 U/L — ABNORMAL HIGH (ref 39–117)
BUN: 13 mg/dL (ref 6–23)
Chloride: 87 mEq/L — ABNORMAL LOW (ref 96–112)
GFR calc Af Amer: >90 mL/min (ref >90)
Glucose, Bld: 104 mg/dL — ABNORMAL HIGH (ref 70–99)
Potassium: 3.6 mEq/L (ref 3.5–5.1)
Total Bilirubin: 0.3 mg/dL (ref 0.3–1.2)

## 2012-03-20 LAB — PROTIME-INR: Prothrombin Time: 26.8 seconds — ABNORMAL HIGH (ref 11.6–15.2)

## 2012-03-20 MED ORDER — HYDROCODONE-ACETAMINOPHEN 5-325 MG PO TABS: 1.0000 | ORAL_TABLET | ORAL | Status: DC | PRN

## 2012-03-20 MED ORDER — WARFARIN SODIUM 5 MG PO TABS: 5.0000 mg | ORAL_TABLET | Freq: Every day | ORAL | Status: DC

## 2012-03-20 MED ORDER — BISACODYL 5 MG PO TBEC: 5.0000 mg | DELAYED_RELEASE_TABLET | Freq: Every day | ORAL | Status: DC | PRN

## 2012-03-20 MED ORDER — ALUM & MAG HYDROXIDE-SIMETH 200-200-20 MG/5ML PO SUSP: 30.0000 mL | Freq: Four times a day (QID) | ORAL | Status: DC | PRN

## 2012-03-20 MED ORDER — SIMVASTATIN 20 MG PO TABS
20.0000 mg | ORAL_TABLET | Freq: Every evening | ORAL | Status: DC
  Administered 2012-03-20: 20 mg via ORAL
  Filled 2012-03-20 (×2): qty 1

## 2012-03-20 MED ORDER — WARFARIN - PHARMACIST DOSING INPATIENT: Freq: Every day | Status: DC

## 2012-03-20 MED ORDER — WARFARIN SODIUM 5 MG PO TABS
5.0000 mg | ORAL_TABLET | Freq: Every day | ORAL | Status: DC
  Administered 2012-03-20: 5 mg via ORAL
  Filled 2012-03-20 (×4): qty 1

## 2012-03-20 MED ORDER — PROMETHAZINE HCL 25 MG PO TABS: 12.5000 mg | ORAL_TABLET | Freq: Four times a day (QID) | ORAL | Status: DC | PRN

## 2012-03-20 MED ORDER — METOPROLOL SUCCINATE ER 25 MG PO TB24
25.0000 mg | ORAL_TABLET | Freq: Every day | ORAL | Status: DC
  Administered 2012-03-20 – 2012-03-21 (×2): 25 mg via ORAL
  Filled 2012-03-20 (×2): qty 1

## 2012-03-20 MED ORDER — ZOLPIDEM TARTRATE 5 MG PO TABS: 5.0000 mg | ORAL_TABLET | Freq: Every evening | ORAL | Status: DC | PRN

## 2012-03-20 MED ORDER — ACETAMINOPHEN 650 MG RE SUPP: 650.0000 mg | Freq: Four times a day (QID) | RECTAL | Status: DC | PRN

## 2012-03-20 MED ORDER — SENNOSIDES-DOCUSATE SODIUM 8.6-50 MG PO TABS
1.0000 | ORAL_TABLET | Freq: Every evening | ORAL | Status: DC | PRN
  Filled 2012-03-20: qty 1

## 2012-03-20 MED ORDER — POTASSIUM CHLORIDE IN NACL 20-0.9 MEQ/L-% IV SOLN
INTRAVENOUS | Status: DC
  Administered 2012-03-20 – 2012-03-21 (×2): via INTRAVENOUS
  Filled 2012-03-20 (×5): qty 1000

## 2012-03-20 MED ORDER — ACETAMINOPHEN 325 MG PO TABS: 650.0000 mg | ORAL_TABLET | Freq: Four times a day (QID) | ORAL | Status: DC | PRN

## 2012-03-20 NOTE — H&P (Addendum)
Victoria [name] is an 78 y.o. female.   Chief Complaint: tired and lightheaded HPI:  The patient is an 78 year old Caucasian woman with recent diagnosis of rectal adenocarcinoma by flexible sigmoidoscopy on March 15, 2012.  Since that time she's had multiple loose bowel movements with mild nausea and very poor food and fluid intake.  She has felt progressively more fatigued and lightheaded, and requires a cane and assistance to ambulate now.  She has not had recent fever, chills, productive cough, chest pain or palpitations, or vomiting.  She has vague mild lower abdominal discomfort and has pain with sitting down.  She had a CT scan of the abdomen and pelvis done by her gynecologist that showed a right rectal mass with adjacent lymphadenopathy (see CT report below).  She has seen her radiation oncologist and has not yet seen her medical oncologist. She is currently not having rectal bleeding.  She was seen in our office today with complaints of bloating and vague bilateral lower quadrant abdominal pain with nausea and diarrhea not decreased with Imodium.  Her medical history is otherwise significant for paroxysmal atrial fibrillation, chronic Coumadin anticoagulation, hypertension, hyperlipidemia, cardiac pacemaker dependence, gastroesophageal reflux disease, and elevated blood pressure.    Past Medical History  Diagnosis Date  . CAD (coronary artery disease)   . Hypertension   . Atrial fibrillation   . Hyperlipidemia   . Rectal carcinoma   . Colon cancer 03/13/12 egd    rectal mass5cm  , 2-3cm proximal to anal verge  . Diverticulosis   . Allergy   . Anxiety     rhematoid    Medications Prior to Admission  Medication Sig Dispense Refill  . diltiazem (CARDIZEM CD) 240 MG 24 hr capsule       . diltiazem (DILACOR XR) 240 MG 24 hr capsule Take 240 mg by mouth daily.      . metoprolol succinate (TOPROL-XL) 25 MG 24 hr tablet       . metoprolol tartrate (LOPRESSOR) 25 MG tablet Take 25 mg by  mouth daily.       . simvastatin (ZOCOR) 20 MG tablet Take 20 mg by mouth every evening.      . warfarin (COUMADIN) 5 MG tablet Take 5 mg by mouth daily.        ADDITIONAL HOME MEDICATIONS: Toprol XL 25 mg daily, warfarin 5 mg daily, simvastatin 20 mg daily, Cardizem CD 240 mg by mouth daily, Align 1 capsule by mouth daily, Anusol HC suppository 25 mg twice daily when necessary  PHYSICIANS INVOLVED IN CARE: Berton Mount (card), Yancey Flemings (GI), Dorothy Puffer (rad onc), Mancel Bale (med onc), Darnell Level (gsurg), Konrad Dolores  (gyn)  Past Surgical History  Procedure Date  . Pace maker 2010  . Rib removed 1934    Pleurisy and pneumonia  . Partial hysterectomy 1972  . Bladder neck suspension 1986  . Heart catherization [id number]  . Abdominal hysterectomy     both ovaries intact,     Family History  Problem Relation Age of Onset  . Heart disease Mother   her father died from cirrhosis and her mother died from heart attack at her young age. There is no family history of diabetes mellitus or colon cancer or breast cancer.   Social History:  reports that she has never smoked. She has never used smokeless tobacco. She reports that she does not drink alcohol or use illicit drugs. she is married (to [place]) and has 2 sons.  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  She has no history of tobacco or alcohol abuse.  Allergies:  Allergies  Allergen Reactions  . Pindolol Swelling    Throat swelled up  . Iodine   . Shellfish Allergy   . Vibramycin (Doxycycline Calcium) Nausea Only  . Doxycycline Nausea Only     ROS: ankle swelling, arthritis, cancer/tumor, heart palpitation, high blood pressure and high cholesterol, gastroesophageal reflux disease, diverticulosis, rhinitis, endometriosis  PHYSICAL EXAM: Blood pressure 125/69, pulse 76, temperature 97.6 F (36.4 C), temperature source Oral, resp. rate 18, height 5\' 1"  (1.549 m), weight 60.7 kg (133 lb 13.1 oz), SpO2  98.00%. In general, the patient is A frail elderly white woman who had an occasional dry cough.  No shortness of breath at rest.  HEENT exam was within normal limits, neck was supple without jugular venous distention or carotid bruit, chest had decreased breath sounds in the lower half of the right hemithorax, heart had irregular rate and rhythm with systolic ejection murmur grade 1/6 cholesterol border, abdomen had mild distention, normal bowel sounds, and an enlarged and tender right inguinal lymph node.  Extremities were without cyanosis, clubbing, or edema and pedal pulses were normal bilaterally.  Neurologic exam: She was alert and well oriented with normal affect, cranial nerves II through XII are normal, she is able to move all extremities well.  No results found for this or any previous visit (from the past 48 hour(s)). No results found.  March 02, 2012 CT scan of the abdomen and pelvis with IV contrast showed the following: Scarring at the right lung base with cardiomegaly, liver, spleen, pancreas, and adrenal glands are within normal limits, 5.3 x 6.7 x 6 x 1 cm mass along the right aspect of the rectum and 2 adjacent nodal masses in the right mid abdomen measuring 8.9 x 7.4 x 7.2 cm in aggregate suspicious for nodal metastases.  Additional 3.0 x 3.0 x 2.6 cm nodal metastasis posterior to the cecum, trace pelvic ascites  Today's office CBC results:  White blood cell count 16.0 with 83% granulocytes, hemoglobin 12.5, hematocrit 40.0, platelets 370  Assessment/Plan #1 Dehydration: Moderately severe and from recent diarrhea with poor food and fluid intake.  She may have had antibiotics recently, so we will check a stool specimen for C. Difficile. We will also give her IV fluids at of moderate rate and anti-emetics. #2 Cough:  Dry and of unclear cause.  Her pulmonary exam is suggestive of pleural effusion, so we'll check her chest x-ray.  She may also have gastroesophageal reflux causing her cough  or another primary lung disorder. #3 Atrial Fibrillation:  Clinically stable and she appears to be in sinus rhythm.  We will check 12-lead EKG today.  She is on Coumadin anticoagulation 5 mg daily and we will check her PT and INR level and have the pharmacist assist [place] with therapeutic dosing. #4 Rectal Adenocarcinoma:  This is a very recent diagnosis and her staging workup is not yet complete.  Plans are for her to have a PET scan on August 28.  She has had a CT scan of the abdomen and pelvis done with her gynecologist. She has abdomen distention and we will check an abdomen ultrasound exam to exclude the possibility of interval ascites development.  Genisis Sonnier G 03/20/2012, 5:56 PM

## 2012-03-20 NOTE — Progress Notes (Signed)
On call MD paged with results of Stat EKG showing Right Bundle Branch Block.  No new orders received.  Will continue to monitor.

## 2012-03-20 NOTE — Progress Notes (Addendum)
ANTICOAGULATION CONSULT NOTE - Initial Consult  Pharmacy Consult for Warfarin Indication: h/o atrial fibrillation  Allergies  Allergen Reactions  . Pindolol Swelling    Throat swelled up  . Iodine   . Shellfish Allergy   . Vibramycin (Doxycycline Calcium) Nausea Only  . Doxycycline Nausea Only   Patient Measurements: Height: 5\' 1"  (154.9 cm) Weight: 133 lb 13.1 oz (60.7 kg) IBW/kg (Calculated) : 47.8   Vital Signs: Temp: 97.6 F (36.4 C) (08/26 1748) Temp src: Oral (08/26 1748) BP: 125/69 mmHg (08/26 1748) Pulse Rate: 76  (08/26 1748)  Labs: No results found for this basename: HGB:2,HCT:3,PLT:3,APTT:3,LABPROT:3,INR:3,HEPARINUNFRC:3,CREATININE:3,CKTOTAL:3,CKMB:3,TROPONINI:3 in the last 72 hours  Estimated Creatinine Clearance: 41.2 ml/min (by C-G formula based on Cr of 0.88).  Medical History: Past Medical History  Diagnosis Date  . CAD (coronary artery disease)   . Hypertension   . Atrial fibrillation   . Hyperlipidemia   . Rectal carcinoma   . Colon cancer 03/13/12 egd    rectal mass5cm  , 2-3cm proximal to anal verge  . Diverticulosis   . Allergy   . Anxiety     rhematoid   Assessment:  84 yof on Coumadin PTA for h/o atrial fibrillation.  Patient was reportedly on 5mg /day, last dose reported taken 8/25.    Goal of Therapy:  INR 2-3 Monitor platelets by anticoagulation protocol: Yes   Plan:   Awaiting PT/INR, pharmacy will f/u  Geoffry Paradise Thi 03/20/2012,6:16 PM   Addendum  INR returned as 2.43, therapeutic.  No bleeding/complications.  No CBC available at this time.  Plan:  Continue coumadin 5mg  po daily.  Daily PT/INR. Pharmacy will f/u.  Geoffry Paradise, PharmD, BCPS Pager: [phone] 8:02 PM Pharmacy #: [phone]

## 2012-03-21 ENCOUNTER — Telehealth: Payer: Self-pay | Admitting: *Deleted

## 2012-03-21 ENCOUNTER — Telehealth: Payer: Self-pay | Admitting: Oncology

## 2012-03-21 DIAGNOSIS — E871 Hypo-osmolality and hyponatremia: Secondary | ICD-10-CM | POA: Diagnosis present

## 2012-03-21 DIAGNOSIS — D62 Acute posthemorrhagic anemia: Secondary | ICD-10-CM | POA: Diagnosis present

## 2012-03-21 LAB — BASIC METABOLIC PANEL
Chloride: 93 mEq/L — ABNORMAL LOW (ref 96–112)
Creatinine, Ser: 0.75 mg/dL (ref 0.50–1.10)
GFR calc Af Amer: 89 mL/min — ABNORMAL LOW (ref >90)
Potassium: 3.6 mEq/L (ref 3.5–5.1)

## 2012-03-21 LAB — PROTIME-INR
INR: 2.51 — ABNORMAL HIGH (ref 0.00–1.49)
Prothrombin Time: 27.5 seconds — ABNORMAL HIGH (ref 11.6–15.2)

## 2012-03-21 LAB — CBC
Platelets: 267 10*3/uL (ref 150–400)
RDW: 14 % (ref 11.5–15.5)
WBC: 10.4 10*3/uL (ref 4.0–10.5)

## 2012-03-21 MED ORDER — HYDROCODONE-ACETAMINOPHEN 5-325 MG PO TABS: 1.0000 | ORAL_TABLET | Freq: Four times a day (QID) | ORAL | Status: DC | PRN

## 2012-03-21 MED ORDER — SENNOSIDES-DOCUSATE SODIUM 8.6-50 MG PO TABS: 1.0000 | ORAL_TABLET | Freq: Every evening | ORAL | Status: DC | PRN

## 2012-03-21 NOTE — Telephone Encounter (Signed)
Per Almira Coaster 8/30 new pt appt w/LT moved to 9/4 w/BS @ 11:30 am. Pt to arrive @ 11 am. Almira Coaster will contact pt and gv her schedule.

## 2012-03-21 NOTE — Progress Notes (Signed)
ANTICOAGULATION CONSULT NOTE - Initial Consult  Pharmacy Consult for Warfarin Indication: h/o atrial fibrillation  Allergies  Allergen Reactions  . Pindolol Swelling    Throat swelled up  . Iodine   . Shellfish Allergy   . Vibramycin (Doxycycline Calcium) Nausea Only  . Doxycycline Nausea Only   Patient Measurements: Height: 5\' 1"  (154.9 cm) Weight: 133 lb 13.1 oz (60.7 kg) IBW/kg (Calculated) : 47.8   Vital Signs: Temp: 98.5 F (36.9 C) (08/27 0443) Temp src: Oral (08/27 0443) BP: 104/44 mmHg (08/27 0443) Pulse Rate: 73  (08/27 0443)  Labs:  Basename 03/21/12 0345 03/20/12 1915  HGB 10.0* --  HCT 29.3* --  PLT 267 --  APTT -- --  LABPROT 27.5* 26.8*  INR 2.51* 2.43*  HEPARINUNFRC -- --  CREATININE 0.75 0.71  CKTOTAL -- --  CKMB -- --  TROPONINI -- --    Estimated Creatinine Clearance: 45.4 ml/min (by C-G formula based on Cr of 0.75).  Medical History: Past Medical History  Diagnosis Date  . CAD (coronary artery disease)   . Hypertension   . Atrial fibrillation   . Hyperlipidemia   . Rectal carcinoma   . Colon cancer 03/13/12 egd    rectal mass5cm  , 2-3cm proximal to anal verge  . Diverticulosis   . Allergy   . Anxiety     rhematoid   Assessment:  Victoria [name] on Coumadin PTA for h/o atrial fibrillation.  Patient was reportedly on 5mg /day, last dose reported taken 8/25.   INR today is 2.51, therapeutic.  No bleeding/complications.  CBC OK. Likely home today  Goal of Therapy:  INR 2-3 Monitor platelets by anticoagulation protocol: Yes    Plan:  Continue coumadin 5mg  po daily.  Daily PT/INR. Pharmacy will f/u if not discharged tonight  Janace Litten, PharmD 03/21/2012,1:55 PM

## 2012-03-21 NOTE — Discharge Summary (Signed)
Physician Discharge Summary  Patient ID: Victoria [name] MRN: [id number] DOB/AGE: [birth year] 78 y.o.  Admit date: 03/20/2012 Discharge date: 03/21/2012   Discharge Diagnoses:  Principal Problem:  *Dehydration Active Problems:  Rectal carcinoma  Diarrhea  Leucocytosis  Hyponatremia  Atrial fibrillation  Cough  Anemia  Chronic anticoagulation  CAD (coronary artery disease)   Discharged Condition: good  Hospital Course:    The patient is an 78 year old Caucasian woman who has a recent diagnosis of rectal adenocarcinoma on 03/15/2012 with spread to adjacent lymph nodes. Since the time of her flexible sigmoidoscopy and diagnosis, over the past 4-5 days, she's had loose bowel movements with mild nausea and very poor food and fluid intake. She has become progressively more fatigued and lightheaded and pars a cane and assistance to ambulate. She had not had recent fever, chills, productive cough, chest pain, palpitations, or vomiting. She has mild lower abdominal discomfort and pain with sitting on her rectum. She has been seen by her radiation oncologist and has not yet seen her medical oncologist. She had not been having rectal bleeding most recently. Her medical history is otherwise significant for paroxysmal atrial fibrillation, chronic Coumadin anticoagulation, hypertension, hyperlipidemia, cardiac pacemaker dependence, gastroesophageal reflux disease, and hypertension. She presented to our office with lightheadedness and fatigue she was admitted to the hospital for IV fluids and further evaluation.  She had a chest x-ray and abdomen ultrasound done. The chest x-ray did not show acute findings and the abdomen x-ray showed the right lower abdomen mass that was seen on CT scan, but no ascites. With IV fluids her strength improved and she was no longer having lightheadedness. This morning she was able to be some breakfast without difficulty. She is no longer having loose bowel movements. She is  also not having nausea, vomiting, or shortness of breath. She very much would like to return home today.   Consults: None  Significant Diagnostic Studies:  X-ray Chest Pa And Lateral   03/20/2012  *RADIOLOGY REPORT*  Clinical Data: Cough.  CHEST - 2 VIEW  Comparison: None.  Findings: Scarring in the right mid and lower lung zones and remote right rib fractures are again seen.  Left lung is clear. No pneumothorax or pleural fluid.  Eventration left hemidiaphragm noted.  There is cardiomegaly with a pacing device in place.  IMPRESSION: No acute finding.  Stable compared to prior exam.   Original Report Authenticated By: Bernadene Bell. Maricela Curet, M.D.    US Abdomen Complete  03/20/2012  *RADIOLOGY REPORT*  Clinical Data:  Abdominal distention.  Recently diagnosed rectal cancer.  COMPLETE ABDOMINAL ULTRASOUND  Comparison:  CT dated 03/02/2012.  Findings:  Gallbladder:  No gallstones, gallbladder wall thickening, or pericholecystic fluid.  Common bile duct:  Normal, measuring 5.6 mm in maximum diameter proximally.  Liver:  No focal lesion identified.  Within normal limits in parenchymal echogenicity.  IVC:  Appears normal.  Pancreas:  The pancreatic tail was obscured by overlying bowel gas and appeared normal on the recent CT.  The remainder of the pancreas has a normal appearance.  Spleen:  Normal, measuring 2.6 cm in length.  Right Kidney:  Normal, measuring 10.3 cm in length.  Left Kidney:  Normal, measuring 10.2 cm in length.  Abdominal aorta:  No aneurysm identified.  An 8.6 x 8.1 x 7.2 cm lobulated, heterogeneous, mid abdominal mass is demonstrated, corresponding to the larger of two adjacent masses seen on the recent CT.  IMPRESSION:  1.  Previously noted right mid abdominal mass  with features suspicious for a metastatic nodal mass. 2.  Otherwise, unremarkable examination.   Original Report Authenticated By: Darrol Angel, M.D.     Labs: Lab Results  Component Value Date   WBC 10.4 03/21/2012   HGB 10.0*  03/21/2012   HCT 29.3* 03/21/2012   MCV 84.0 03/21/2012   PLT 267 03/21/2012     Lab 03/21/12 0345 03/20/12 1915  NA 124* --  K 3.6 --  CL 93* --  CO2 24 --  BUN 11 --  CREATININE 0.75 --  CALCIUM 8.7 --  PROT -- 5.9*  BILITOT -- 0.3  ALKPHOS -- 153*  ALT -- 10  AST -- 21  GLUCOSE 107* --       Lab Results  Component Value Date   INR 2.51* 03/21/2012   INR 2.43* 03/20/2012   INR 1.2 09/11/2008     No results found for this or any previous visit (from the past 240 hour(s)).    Discharge Exam: Blood pressure 104/44, pulse 73, temperature 98.5 F (36.9 C), temperature source Oral, resp. rate 18, height 5\' 1"  (1.549 m), weight 60.7 kg (133 lb 13.1 oz), SpO2 93.00%.  Physical Exam:  In general, the patient is an elderly white woman who was in no apparent distress while standing upright after going to the bathroom. HEENT exam was within normal limits and she did not have scleral icterus. Chest was clear to auscultation, heart had a regular rate and rhythm without significant murmur or gallop, abdomen had normal bowel sounds and a palpable mass in the right lower quadrant that is somewhat tender. There was no rebound tenderness. Extremities were without cyanosis, clubbing, or edema. Neurologic exam: She was alert and well oriented with normal affect and she was able to ambulate without assistance.  Disposition: she will be discharged to the hospital today after completing her second liter of IV fluids. She was advised to continue to do her best with food and fluid consumption. She was also advised to try to consume one bottle of Pedialyte daily for the next week.  Discharge Orders    Future Appointments: Provider: Department: Dept Phone: Center:   03/22/2012 10:30 AM [facility] [phone] [place]     Future Orders Please Complete By Expires   Diet - low sodium heart healthy      Care order/instruction      Comments:   She may be discharged to home after  current IV fluid bag has been infused   Increase activity slowly      Discharge instructions      Comments:   Later today she should call our office to schedule a followup visit in our office within one week of discharge. She have the PET scan tomorrow as planned by her oncologist. She should continue to try her best to consume food and liquids.   Call MD for:      Comments:   Call physician for development of fever, chills, nausea, vomiting, diarrhea, dizziness, shortness of breath, or other concerning symptoms.     Medication List  As of 03/21/2012  9:45 AM   STOP taking these medications         diltiazem 240 MG 24 hr capsule         TAKE these medications         acetaminophen 500 MG tablet   Commonly known as: TYLENOL   Take 500 mg by mouth every 6 (six) hours as needed. For  pain.      metoprolol succinate 25 MG 24 hr tablet   Commonly known as: TOPROL-XL   Take 25 mg by mouth daily.      senna-docusate 8.6-50 MG per tablet   Commonly known as: Senokot-S   Take 1 tablet by mouth at bedtime as needed.      simvastatin 20 MG tablet   Commonly known as: ZOCOR   Take 20 mg by mouth every evening.      warfarin 5 MG tablet   Commonly known as: COUMADIN   Take 5 mg by mouth daily.             Signed: Garlan Fillers 03/21/2012, 9:45 AM

## 2012-03-21 NOTE — Progress Notes (Signed)
D/C instructions reviewed with pt and family.  Deny questions. Pt out of dept via w/c.  dph

## 2012-03-21 NOTE — Telephone Encounter (Signed)
Visited patient in hospital prior to DC.  Appointment given to patient and her daughter with Dr. Truett Perna for 03/29/12 1100.  Patient verbalized understanding and appreciation.  This RN confirmed she has contact information and phone numbers.  Will continue to follow.

## 2012-03-21 NOTE — Telephone Encounter (Signed)
Added new pt appt for 8/30 @ 8:45 am (FC/LT). Per 8/23 pof r/s from 8/23 and w/LT. LT already has a new pt for 8/29 @ 1:45 pm. Per Almira Coaster pt was scheduled for 8/30. Per Almira Coaster she will gv appt to pt.

## 2012-03-22 ENCOUNTER — Encounter (HOSPITAL_COMMUNITY)
Admission: RE | Admit: 2012-03-22 | Discharge: 2012-03-22 | Disposition: A | Discharge status: Home / Self Care | Payer: Medicare Other | Source: Ambulatory Visit | Attending: Radiation Oncology | Admitting: Radiation Oncology

## 2012-03-22 DIAGNOSIS — C20 Malignant neoplasm of rectum: Secondary | ICD-10-CM

## 2012-03-22 DIAGNOSIS — Z950 Presence of cardiac pacemaker: Secondary | ICD-10-CM | POA: Insufficient documentation

## 2012-03-22 DIAGNOSIS — I517 Cardiomegaly: Secondary | ICD-10-CM | POA: Insufficient documentation

## 2012-03-22 DIAGNOSIS — C189 Malignant neoplasm of colon, unspecified: Secondary | ICD-10-CM | POA: Insufficient documentation

## 2012-03-22 LAB — GLUCOSE, CAPILLARY: Glucose-Capillary: 106 mg/dL — ABNORMAL HIGH (ref 70–99)

## 2012-03-22 MED ORDER — FLUDEOXYGLUCOSE F - 18 (FDG) INJECTION
14.8000 | Freq: Once | INTRAVENOUS | Status: AC | PRN
  Administered 2012-03-22: 14.8 via INTRAVENOUS

## 2012-03-24 ENCOUNTER — Telehealth: Payer: Self-pay | Admitting: *Deleted

## 2012-03-24 ENCOUNTER — Inpatient Hospital Stay (HOSPITAL_COMMUNITY): Payer: Medicare Other

## 2012-03-24 ENCOUNTER — Ambulatory Visit: Payer: Medicare Other | Admitting: Nurse Practitioner

## 2012-03-24 ENCOUNTER — Ambulatory Visit: Payer: Medicare Other

## 2012-03-24 ENCOUNTER — Encounter (HOSPITAL_COMMUNITY): Payer: Self-pay | Admitting: *Deleted

## 2012-03-24 ENCOUNTER — Inpatient Hospital Stay (HOSPITAL_COMMUNITY)
Admission: AD | Admit: 2012-03-24 | Discharge: 2012-04-14 | DRG: 330 | Disposition: A | Discharge status: Home Health | Payer: Medicare Other | Source: Ambulatory Visit | Attending: General Surgery | Admitting: General Surgery

## 2012-03-24 VITALS — BP 117/68 | HR 87 | Temp 97.4°F | Resp 18 | Ht 61.0 in | Wt 139.6 lb

## 2012-03-24 DIAGNOSIS — K56609 Unspecified intestinal obstruction, unspecified as to partial versus complete obstruction: Secondary | ICD-10-CM

## 2012-03-24 DIAGNOSIS — C61 Malignant neoplasm of prostate: Secondary | ICD-10-CM

## 2012-03-24 DIAGNOSIS — E871 Hypo-osmolality and hyponatremia: Secondary | ICD-10-CM | POA: Diagnosis present

## 2012-03-24 DIAGNOSIS — I480 Paroxysmal atrial fibrillation: Secondary | ICD-10-CM | POA: Diagnosis present

## 2012-03-24 DIAGNOSIS — R112 Nausea with vomiting, unspecified: Secondary | ICD-10-CM

## 2012-03-24 DIAGNOSIS — Z299 Encounter for prophylactic measures, unspecified: Secondary | ICD-10-CM

## 2012-03-24 DIAGNOSIS — R141 Gas pain: Secondary | ICD-10-CM

## 2012-03-24 DIAGNOSIS — R1900 Intra-abdominal and pelvic swelling, mass and lump, unspecified site: Secondary | ICD-10-CM | POA: Diagnosis present

## 2012-03-24 DIAGNOSIS — C785 Secondary malignant neoplasm of large intestine and rectum: Secondary | ICD-10-CM

## 2012-03-24 DIAGNOSIS — E860 Dehydration: Secondary | ICD-10-CM

## 2012-03-24 DIAGNOSIS — C569 Malignant neoplasm of unspecified ovary: Secondary | ICD-10-CM

## 2012-03-24 DIAGNOSIS — Z950 Presence of cardiac pacemaker: Secondary | ICD-10-CM | POA: Diagnosis present

## 2012-03-24 DIAGNOSIS — C20 Malignant neoplasm of rectum: Secondary | ICD-10-CM | POA: Diagnosis present

## 2012-03-24 DIAGNOSIS — R110 Nausea: Secondary | ICD-10-CM

## 2012-03-24 HISTORY — DX: Unspecified intestinal obstruction, unspecified as to partial versus complete obstruction: K56.609

## 2012-03-24 LAB — COMPREHENSIVE METABOLIC PANEL
AST: 25 U/L (ref 0–37)
Albumin: 3 g/dL — ABNORMAL LOW (ref 3.5–5.2)
Alkaline Phosphatase: 139 U/L — ABNORMAL HIGH (ref 39–117)
BUN: 12 mg/dL (ref 6–23)
Chloride: 86 mEq/L — ABNORMAL LOW (ref 96–112)
Potassium: 4 mEq/L (ref 3.5–5.1)
Total Bilirubin: 0.3 mg/dL (ref 0.3–1.2)

## 2012-03-24 LAB — DIFFERENTIAL
Basophils Absolute: 0 10*3/uL (ref 0.0–0.1)
Eosinophils Absolute: 0 10*3/uL (ref 0.0–0.7)
Eosinophils Relative: 0 % (ref 0–5)

## 2012-03-24 LAB — CBC
MCH: 29.6 pg (ref 26.0–34.0)
MCV: 84.7 fL (ref 78.0–100.0)
Platelets: 399 10*3/uL (ref 150–400)
RDW: 13.8 % (ref 11.5–15.5)

## 2012-03-24 MED ORDER — ACETAMINOPHEN 650 MG RE SUPP: 650.0000 mg | Freq: Four times a day (QID) | RECTAL | Status: DC | PRN

## 2012-03-24 MED ORDER — METOPROLOL SUCCINATE ER 25 MG PO TB24
25.0000 mg | ORAL_TABLET | Freq: Every day | ORAL | Status: DC
  Administered 2012-03-25 – 2012-03-31 (×7): 25 mg via ORAL
  Filled 2012-03-24 (×8): qty 1

## 2012-03-24 MED ORDER — ACETAMINOPHEN 325 MG PO TABS
650.0000 mg | ORAL_TABLET | Freq: Four times a day (QID) | ORAL | Status: DC | PRN
  Administered 2012-03-25: 650 mg via ORAL

## 2012-03-24 MED ORDER — ONDANSETRON HCL 4 MG/2ML IJ SOLN
4.0000 mg | Freq: Four times a day (QID) | INTRAMUSCULAR | Status: DC | PRN
  Administered 2012-03-30 – 2012-04-04 (×4): 4 mg via INTRAVENOUS
  Filled 2012-03-24 (×4): qty 2

## 2012-03-24 MED ORDER — PROMETHAZINE HCL 25 MG/ML IJ SOLN
12.5000 mg | INTRAMUSCULAR | Status: DC | PRN
  Administered 2012-03-24 – 2012-03-31 (×2): 12.5 mg via INTRAVENOUS
  Filled 2012-03-24 (×3): qty 1

## 2012-03-24 MED ORDER — SODIUM CHLORIDE 0.9 % IV SOLN
INTRAVENOUS | Status: DC
  Administered 2012-03-24: 100 mL/h via INTRAVENOUS
  Administered 2012-03-25: 1000 mL via INTRAVENOUS
  Administered 2012-03-27: 02:00:00 via INTRAVENOUS

## 2012-03-24 NOTE — Progress Notes (Signed)
Victoria [name] was admitted to the hospital with nausea/vomiting and abdominal distention. Please see the admission history and physical for complete details

## 2012-03-24 NOTE — Consult Note (Signed)
General Surgery [facility]  Reason for Consult: small bowel obstruction  Referring Physician: Dr. Quenton Fetter, medical oncology  Victoria [name] is an 78 y.o. female.   HPI: patient is an 78 yo WF whom I saw initially in consultation on 03/08/2012 on referral from Dr. Varney Baas.  Patient was found on gynecologic exam to have a large rectal mass.  CTA demonstrated the mass as well as intraperitoneal and retroperitoneal masses felt to be matted lymph nodes.  Patient was referred to medical oncology, radiation oncology, and gastroenterology for evaluation.  PET scan was performed two days ago.  Following that study the patient developed abdominal distension.  This has progressed with onset of nausea and emesis.  Patient is passing some flatus, and has had diarrhea.  Seen today by Dr. Truett Perna and admitted with signs and symptoms of small bowel obstruction.  Past Medical History  Diagnosis Date  . CAD (coronary artery disease)   . Hypertension   . Atrial fibrillation   . Hyperlipidemia   . Rectal carcinoma   . Colon cancer 03/13/12 egd    rectal mass5cm  , 2-3cm proximal to anal verge  . Diverticulosis   . Allergy   . Anxiety     rhematoid  . Vomiting     started on 03/23/12  . Obstruction colon 03/24/12    family told obstruction may be outside colon    Past Surgical History  Procedure Date  . Pace maker 2010  . Rib removed 1934    Pleurisy and pneumonia  . Partial hysterectomy 1972  . Bladder neck suspension 1986  . Heart catherization [id number]  . Abdominal hysterectomy     both ovaries intact,     Family History  Problem Relation Age of Onset  . Heart disease Mother     Social History:  reports that she has never smoked. She has never used smokeless tobacco. She reports that she does not drink alcohol or use illicit drugs.  Allergies:  Allergies  Allergen Reactions  . Pindolol Swelling    Throat swelled up  . Iodine   . Shellfish  Allergy   . Vibramycin (Doxycycline Calcium) Nausea Only  . Doxycycline Nausea Only    Medications: I have reviewed the patient's current medications.  Results for orders placed during the hospital encounter of 03/24/12 (from the past 48 hour(s))  COMPREHENSIVE METABOLIC PANEL     Status: Abnormal   Collection Time   03/24/12  6:23 PM      Component Value Range Comment   Sodium 123 (*) 135 - 145 mEq/L    Potassium 4.0  3.5 - 5.1 mEq/L    Chloride 86 (*) 96 - 112 mEq/L    CO2 28  19 - 32 mEq/L    Glucose, Bld 119 (*) 70 - 99 mg/dL    BUN 12  6 - 23 mg/dL    Creatinine, Ser 8.29  0.50 - 1.10 mg/dL    Calcium 9.4  8.4 - 56.2 mg/dL    Total Protein 6.1  6.0 - 8.3 g/dL    Albumin 3.0 (*) 3.5 - 5.2 g/dL    AST 25  0 - 37 U/L    ALT 15  0 - 35 U/L    Alkaline Phosphatase 139 (*) 39 - 117 U/L    Total Bilirubin 0.3  0.3 - 1.2 mg/dL    GFR calc non Af Amer 79 (*) >90 mL/min    GFR calc Af  Amer >90  >90 mL/min   CBC     Status: Abnormal   Collection Time   03/24/12  6:23 PM      Component Value Range Comment   WBC 13.4 (*) 4.0 - 10.5 K/uL    RBC 4.06  3.87 - 5.11 MIL/uL    Hemoglobin 12.0  12.0 - 15.0 g/dL    HCT 34.7 (*) 42.5 - 46.0 %    MCV 84.7  78.0 - 100.0 fL    MCH 29.6  26.0 - 34.0 pg    MCHC 34.9  30.0 - 36.0 g/dL    RDW 95.6  38.7 - 56.4 %    Platelets 399  150 - 400 K/uL   DIFFERENTIAL     Status: Abnormal   Collection Time   03/24/12  6:23 PM      Component Value Range Comment   Neutrophils Relative 87 (*) 43 - 77 %    Neutro Abs 11.7 (*) 1.7 - 7.7 K/uL    Lymphocytes Relative 7 (*) 12 - 46 %    Lymphs Abs 0.9  0.7 - 4.0 K/uL    Monocytes Relative 6  3 - 12 %    Monocytes Absolute 0.9  0.1 - 1.0 K/uL    Eosinophils Relative 0  0 - 5 %    Eosinophils Absolute 0.0  0.0 - 0.7 K/uL    Basophils Relative 0  0 - 1 %    Basophils Absolute 0.0  0.0 - 0.1 K/uL   PROTIME-INR     Status: Abnormal   Collection Time   03/24/12  6:23 PM      Component Value Range Comment    Prothrombin Time 45.0 (*) 11.6 - 15.2 seconds    INR 4.71 (*) 0.00 - 1.49     Acute Abdominal Series  03/24/2012  *RADIOLOGY REPORT*  Clinical Data: Rectal mass, nausea, vomiting, abdominal pain and distention  ACUTE ABDOMEN SERIES (ABDOMEN 2 VIEW & CHEST 1 VIEW)  Comparison: Chest radiograph 03/20/2012 Correlation:  CT abdomen from PET CT 03/22/2012  Findings: Left subclavian sequential transvenous pacemaker leads project at right atrium and right ventricle. Enlargement of cardiac silhouette. Tortuous aorta. Mediastinal contours and pulmonary vascularity normal. Eventration left diaphragm. Emphysematous changes with right upper lobe scarring. Increased right pleural effusion and basilar atelectasis. No definite pulmonary infiltrate or pneumothorax. Diffuse osseous demineralization.  Dilated small bowel loops with distention of the stomach with prominent air fluid level. Findings compatible with small bowel obstruction. Colon decompressed with multiple diverticula at the descending and sigmoid colon. No definite bowel wall thickening. On the upright view there is questionably free air under the left hemidiaphragm though this could be an artifact secondary to stomach riding under a focal eventration. Thoracolumbar scoliosis. No definite urinary tract calcification.  IMPRESSION: Increased right pleural effusion and basilar atelectasis. Gastric and proximal small bowel loops distention consistent with small bowel obstruction. Colonic diverticulosis. Small bowel distention has increased since recent PET CT exam. Questionable free air under the left diaphragm versus artifact from eventration; recommend left lateral decubitus abdominal radiograph to exclude free intraperitoneal air.  Critical Value/emergent results were called by telephone at the time of interpretation on 03/24/2012 at 1850 hours to patient's nurse Raiford Noble RN on [address] ward, who verbally acknowledged these results.   Original Report Authenticated By:  Lollie Marrow, M.D.    Dg Abd Decub  03/24/2012  *RADIOLOGY REPORT*  Clinical Data: Abnormal radiograph question free intraperitoneal air under left diaphragm versus  artifact from eventration  ABDOMEN - 1 VIEW DECUBITUS  Comparison: 03/24/2012  Findings: No free intraperitoneal air on left lateral decubitus view. Small bowel dilatation as previously noted. Atelectasis and effusion at the right base. Osseous demineralization.  IMPRESSION: No free intraperitoneal air identified. Small bowel dilatation/obstruction as previously noted.   Original Report Authenticated By: Lollie Marrow, M.D.     Review of Systems  HENT: Negative.   Eyes: Negative.   Respiratory: Negative.   Cardiovascular: Negative.   Gastrointestinal: Positive for nausea, vomiting, abdominal pain and diarrhea. Negative for heartburn, constipation, blood in stool and melena.       Abdominal distension for two days  Genitourinary: Negative.   Musculoskeletal: Negative.   Skin: Negative.   Neurological: Positive for weakness.  Endo/Heme/Allergies: Negative.   Psychiatric/Behavioral: Negative.    Blood pressure 133/77, pulse 73, temperature 98 F (36.7 C), temperature source Oral, resp. rate 18, height 5\' 1"  (1.549 m), weight 139 lb (63.05 kg), SpO2 96.00%. Physical Exam  Constitutional: She is oriented to person, place, and time. She appears well-developed and well-nourished. No distress.  HENT:  Head: Normocephalic and atraumatic.  Right Ear: External ear normal.  Left Ear: External ear normal.  Nose: Nose normal.  Mouth/Throat: Oropharynx is clear and moist.  Eyes: Conjunctivae and EOM are normal. Pupils are equal, round, and reactive to light. No scleral icterus.  Neck: Normal range of motion. Neck supple. No tracheal deviation present. No thyromegaly present.  Cardiovascular: Normal rate, regular rhythm and normal heart sounds.   No murmur heard. Respiratory: Effort normal and breath sounds normal. She has no wheezes.  She has no rales.  GI: Bowel sounds are normal. She exhibits distension (marked). She exhibits no mass. There is tenderness (mild, diffuse). There is no rebound and no guarding.  Musculoskeletal: Normal range of motion.  Lymphadenopathy:    She has no cervical adenopathy.  Neurological: She is alert and oriented to person, place, and time.  Skin: Skin is warm and dry.  Psychiatric: She has a normal mood and affect. Her behavior is normal. Judgment and thought content normal.    Assessment/Plan: 1.  Small bowel obstruction  - NPO, IV hydration  - correct electrolyte abnormalities  - hold anticoagulation for possible operative intervention this admission  - check AXR in AM  - may need NG tube placed 2.  Malignancy  - final path results pending - may not be a rectal primary - ? Gynecologic - will discuss with Dr. Truett Perna and Dr. Jennette Kettle  Will follow closely with you.  Velora Heckler, MD, [facility] Office: [phone]    Marquavious Nazar Judie Petit 03/24/2012, 10:32 PM

## 2012-03-24 NOTE — Telephone Encounter (Signed)
Received phone call from patient's son, Victoria [name], today at approx. 1245.  He stated his mother has been throwing up brown bile since yesterday and has not been able to keep anything down.  He states she does not have diarrhea and has not had a BM despite using the medicine Dr. Jarold Motto prescribed..  He states he cannot get through to Dr. Norval Gable office.  He states his mother is weak and he is afraid she may be "blocked"  and he wants Dr. Truett Perna know that he is very concerned and questioned if he could bring her to see him or go to the ED.  Discussed with Dr. Truett Perna who is on call and stated to have patient come to the office as soon as possible.  This information was given to patient's son, Victoria [name], who stated they would be here in 30 minutes.

## 2012-03-24 NOTE — H&P (Signed)
Admission History and Physical/Oncology consultation note      Chief Complaint:  Nausea/vomiting, abdominal distention. HPI: Victoria [name] is an 78 year old woman recently found to have a rectal mass. She noted a change in bowel habits in May 2013. Barium enema on 12/22/2011 showed moderately redundant colon with moderate to severe diverticulosis primarily from the splenic flexure and throughout the sigmoid. She developed rectal bleeding in June 2013. Pelvic examination by her gynecologist showed a rectal mass. CT scans of the abdomen and pelvis on 03/02/2012 showed a rectal mass measuring 6.7 cm and 2 adjacent nodal masses in the right mid abdomen measuring 8.9 x 7.4 x 7.2 cm suspicious for nodal metastases and an additional 3.0 x 3.0 x 2.6 cm nodal metastasis posterior to the cecum. A sigmoidoscopy on 03/13/2012 showed a 5 cm malignant appearing mass with central ulceration in the rectum about 2-3 cm proximal to the anal verge. Pathology showed moderate to poorly differentiated adenocarcinoma with ulceration and prolapse changes. She was seen by Dr. Gerrit Friends and also referred to radiation and medical oncology. She was hospitalized on 03/20/2012 with dehydration presenting with loose stools and nausea. She was discharged home on 03/21/2012. PET scan on 03/22/2012 showed an intensely hypermetabolic large mass along the right aspect of the rectum, intensely hypermetabolic right iliac fossa mass and increased caliber of small bowel loops containing air-fluid levels.  She was scheduled to see Dr. Truett Perna next week. She contacted the office today to report abdominal distention and persistent nausea/vomiting since discharge from the hospital. She estimates that she has vomited 6 times today. She describes the emesis as "bile". She notes marked abdominal distention over the past several days. She last had a bowel movement on 03/20/2012. She has diffuse abdominal pain which is relieved with Tylenol. She denies rectal  bleeding. Appetite is poor.    Past Medical History  Diagnosis Date  . CAD (coronary artery disease)   . Hypertension   . Atrial fibrillation   . Hyperlipidemia   .    Marland Kitchen Sigmoidoscopy  03/13/12     rectal mass5cm  , 2-3cm proximal to anal verge  . Diverticulosis   .    Marland Kitchen Anxiety         Past Surgical History  Procedure Date  . Pacemaker 2010  . Rib removed 1934    Pleurisy and pneumonia  . Partial hysterectomy  (endometriosis ) 1972  . Bladder neck suspension 1986  . Heart catherization [id number]  .          Current medications: 1. Tylenol as needed 2. Toprol-XL 25 mg daily 3. Zofran as needed 4. Senokot S 5. Zocor 20 mg daily 6. Coumadin  Allergies  Allergen Reactions  . Pindolol Swelling    Throat swelled up  . Iodine   . Shellfish Allergy   . Vibramycin (Doxycycline Calcium) Nausea Only  . Doxycycline Nausea Only    Family history: Mother deceased with a heart attack at age 12; father deceased with alcoholic cirrhosis at age 32; brother in good health; 4 sisters all living. No family history of malignancy.  Social history: She lives in [place]. She is married. She has 2 sons both reported to be in good health. She is retired. She was most recently employed as a Engineer, manufacturing systems. No alcohol or tobacco use. Remote blood transfusion  ROS: She estimates she has lost 5-7 pounds. Appetite is poor. No fevers or sweats. No unusual headaches. No vision change. No mouth sores. She denies dysphagia. No  shortness of breath or cough. No chest pain. She denies bleeding. Last bowel movement was 03/20/2012. She notes increased abdominal distention over the past several days. She is nauseated and is intermittently vomiting. She describes the emesis as "bile". She estimates vomiting 6 times today. She has diffuse abdominal pain. The pain is relieved with Tylenol. No lightheadedness or dizziness.  Physical: Vital signs-temperature 97.4, heart rate 87, respirations 18,  blood pressure 117/68. Weight 139.6 pounds.  General: Pleasant elderly female in no acute distress. HEENT: Normocephalic, atraumatic. Pupils equal round and reactive to light. Oropharynx is without thrush or ulceration. Mucous membranes appear moist. Chest: Lungs are clear. Breath sounds diminished throughout the right lung field. No respiratory distress. Cardiovascular: Regular cardiac rhythm. She has a pacemaker. Abdomen: Distended. Generalized tenderness. Bowel sounds hypoactive. Extremities: No edema. Neuro: Alert and oriented. Follows commands. Motor strength 5 over 5. Breasts: Bilateral breast without mass Lymph nodes: No cervical, supraclavicular, axillary, or inguinal nodes   Assessment/Plan  1. Rectal mass status post sigmoidoscopy 03/13/2012 with findings of a 5 cm malignant appearing mass with central ulceration in the rectum 2-3 cm proximal to the anal verge. Pathology showed moderate to poorly differentiated adenocarcinoma with ulceration and prolapse changes. 2. CT abdomen/pelvis on 03/02/2012 with a 6.7 cm mass along the right aspect of the rectum; 2 adjacent nodal masses in the right mid abdomen measuring 8.9 x 7.4 x 7.2 cm and an additional 3.0 x 3.0 x 2.6 cm nodal mass posterior to the cecum. 3. PET scan 03/22/2012 with an intensely hypermetabolic large mass along the right aspect of the rectum; an intensely hypermetabolic right iliac fossa mass; increased caliber of small bowel loops containing air-fluid levels. 4. Nausea/vomiting, abdominal distention. 5. Atrial fibrillation maintained on Coumadin.  Disposition-[name]'s presentation is concerning for a bowel obstruction. Dr. Truett Perna has spoken with Dr. Gerrit Friends. She will be admitted for intravenous hydration and placed on n.p.o. status. Dr. Gerrit Friends will see her in the hospital. We will obtain abdominal x-rays and labs to include CBC, chemistry panel, PT/INR and CA 125.   Patient interviewed and examined by Dr. Truett Perna;  plan per Dr. Truett Perna.   Lonna Cobb ANP/GNP-BC 03/24/2012, 4:14 PM   I interviewed and examined [name]. I reviewed the CT and PET scan. Her case was presented at the GI tumor conference on 03/22/2012. She presents with symptoms of a bowel obstruction. She will be admitted for supportive care measures and further in evaluation.  Reviewed the imaging studies is most suggestive of extrinsic tumor invading the rectum. Immunohistochemical stains on the rectal biopsy specimen are most consistent with a GYN primary. I suspect she has ovarian cancer with carcinomatosis and a secondary bowel obstruction. GYN oncology can be consulted if recommended by Dr. Gerrit Friends.  Coumadin anticoagulation will be held in anticipation of the need for surgery. We will consider systemic treatment options based on operative findings and the final surgical pathology.

## 2012-03-25 DIAGNOSIS — K5900 Constipation, unspecified: Secondary | ICD-10-CM

## 2012-03-25 DIAGNOSIS — R112 Nausea with vomiting, unspecified: Secondary | ICD-10-CM

## 2012-03-25 DIAGNOSIS — R109 Unspecified abdominal pain: Secondary | ICD-10-CM

## 2012-03-25 DIAGNOSIS — C7960 Secondary malignant neoplasm of unspecified ovary: Secondary | ICD-10-CM

## 2012-03-25 LAB — BASIC METABOLIC PANEL
BUN: 12 mg/dL (ref 6–23)
Calcium: 8.8 mg/dL (ref 8.4–10.5)
GFR calc non Af Amer: 79 mL/min — ABNORMAL LOW (ref >90)
Glucose, Bld: 114 mg/dL — ABNORMAL HIGH (ref 70–99)
Potassium: 3.8 mEq/L (ref 3.5–5.1)

## 2012-03-25 LAB — CA 125: CA 125: 242.9 U/mL — ABNORMAL HIGH (ref 0.0–30.2)

## 2012-03-25 LAB — PROTIME-INR: INR: 4.84 — ABNORMAL HIGH (ref 0.00–1.49)

## 2012-03-25 MED ORDER — VITAMIN K1 10 MG/ML IJ SOLN
2.0000 mg | Freq: Once | INTRAMUSCULAR | Status: AC
  Administered 2012-03-25: 2 mg via SUBCUTANEOUS
  Filled 2012-03-25: qty 0.2

## 2012-03-25 MED ORDER — ACETAMINOPHEN 325 MG PO TABS
ORAL_TABLET | ORAL | Status: AC
  Administered 2012-03-25: 650 mg via ORAL
  Filled 2012-03-25: qty 2

## 2012-03-25 MED ORDER — PHENOL 1.4 % MT LIQD
1.0000 | OROMUCOSAL | Status: DC | PRN
  Filled 2012-03-25: qty 177

## 2012-03-25 NOTE — Progress Notes (Signed)
Subjective: The patient is seen and examined today. She is feeling fine today with no specific complaints except for constipation and lower abdominal pain. She denied having any significant nausea or vomiting. She has no fever or chills. She currently has NG tube for suction. Her daughter-in-law at the bedside.  Objective: Vital signs in last 24 hours: Temp:  [97.4 F (36.3 C)-98 F (36.7 C)] 98 F (36.7 C) (08/31 0525) Pulse Rate:  [73-87] 73  (08/31 0525) Resp:  [18] 18  (08/31 0525) BP: (117-151)/(68-81) 120/72 mmHg (08/31 0525) SpO2:  [96 %] 96 % (08/31 0525) Weight:  [139 lb (63.05 kg)-139 lb 9.6 oz (63.322 kg)] 139 lb (63.05 kg) (08/30 1718)  Intake/Output from previous day: 08/30 0701 - 08/31 0700 In: -  Out: 1600 [Emesis/NG output:1600] Intake/Output this shift: Total I/O In: 1231 [I.V.:1231] Out: 400 [Emesis/NG output:400]  General appearance: alert, cooperative and no distress Resp: clear to auscultation bilaterally Cardio: regular rate and rhythm, S1, S2 normal, no murmur, click, rub or gallop GI: soft, non-tender; bowel sounds normal; no masses,  no organomegaly Extremities: extremities normal, atraumatic, no cyanosis or edema  Lab Results:   [facility] 03/24/12 1823  WBC 13.4*  HGB 12.0  HCT 34.4*  PLT 399   BMET  Basename 03/25/12 0404 03/24/12 1823  NA 126* 123*  K 3.8 4.0  CL 89* 86*  CO2 30 28  GLUCOSE 114* 119*  BUN 12 12  CREATININE 0.70 0.68  CALCIUM 8.8 9.4    Studies/Results: Acute Abdominal Series  03/24/2012  *RADIOLOGY REPORT*  Clinical Data: Rectal mass, nausea, vomiting, abdominal pain and distention  ACUTE ABDOMEN SERIES (ABDOMEN 2 VIEW & CHEST 1 VIEW)  Comparison: Chest radiograph 03/20/2012 Correlation:  CT abdomen from PET CT 03/22/2012  Findings: Left subclavian sequential transvenous pacemaker leads project at right atrium and right ventricle. Enlargement of cardiac silhouette. Tortuous aorta. Mediastinal contours and pulmonary  vascularity normal. Eventration left diaphragm. Emphysematous changes with right upper lobe scarring. Increased right pleural effusion and basilar atelectasis. No definite pulmonary infiltrate or pneumothorax. Diffuse osseous demineralization.  Dilated small bowel loops with distention of the stomach with prominent air fluid level. Findings compatible with small bowel obstruction. Colon decompressed with multiple diverticula at the descending and sigmoid colon. No definite bowel wall thickening. On the upright view there is questionably free air under the left hemidiaphragm though this could be an artifact secondary to stomach riding under a focal eventration. Thoracolumbar scoliosis. No definite urinary tract calcification.  IMPRESSION: Increased right pleural effusion and basilar atelectasis. Gastric and proximal small bowel loops distention consistent with small bowel obstruction. Colonic diverticulosis. Small bowel distention has increased since recent PET CT exam. Questionable free air under the left diaphragm versus artifact from eventration; recommend left lateral decubitus abdominal radiograph to exclude free intraperitoneal air.  Critical Value/emergent results were called by telephone at the time of interpretation on 03/24/2012 at 1850 hours to patient's nurse Raiford Noble RN on [address] ward, who verbally acknowledged these results.   Original Report Authenticated By: Lollie Marrow, M.D.    Dg Abd Decub  03/24/2012  *RADIOLOGY REPORT*  Clinical Data: Abnormal radiograph question free intraperitoneal air under left diaphragm versus artifact from eventration  ABDOMEN - 1 VIEW DECUBITUS  Comparison: 03/24/2012  Findings: No free intraperitoneal air on left lateral decubitus view. Small bowel dilatation as previously noted. Atelectasis and effusion at the right base. Osseous demineralization.  IMPRESSION: No free intraperitoneal air identified. Small bowel dilatation/obstruction as previously noted.  Original  Report Authenticated By: Lollie Marrow, M.D.     Medications: I have reviewed the patient's current medications.  CODE STATUS: Full code  Assessment/Plan: This is a very pleasant 78 years old white female recently diagnosed with metastatic poorly differentiated carcinoma of questionable gynecologic primary. The patient was admitted with small bowel obstruction. She was seen by Dr. Gerrit Friends and currently on NG tube suctioning. Dr. Gerrit Friends will consult with gynecology before considering any surgical procedure. We'll continue supportive care at this point.  LOS: 1 day    Abrey Bradway K. 03/25/2012

## 2012-03-25 NOTE — Progress Notes (Signed)
Patient ID: Victoria [name], female   DOB: 1927-02-06, 78 y.o.   MRN: [id number]  General Surgery - [facility] - Progress Note  HD# 2  Subjective: Patient now with NG tube placed for persistent emesis.  More comfortable.  Objective: Vital signs in last 24 hours: Temp:  [97.4 F (36.3 C)-98 F (36.7 C)] 98 F (36.7 C) (08/31 0525) Pulse Rate:  [73-87] 73  (08/31 0525) Resp:  [18] 18  (08/31 0525) BP: (117-151)/(68-81) 120/72 mmHg (08/31 0525) SpO2:  [96 %] 96 % (08/31 0525) Weight:  [139 lb (63.05 kg)-139 lb 9.6 oz (63.322 kg)] 139 lb (63.05 kg) (08/30 1718) Last BM Date: 03/24/12  Intake/Output from previous day: 08/30 0701 - 08/31 0700 In: -  Out: 1600 [Emesis/NG output:1600]  Exam: HEENT - clear, not icteric Neck - soft Chest - clear bilaterally Cor - RRR, no murmur Abd - improved distension; BS present; small bleeding per rectum, no BM's, no flatus; minimal tenderness Ext - no significant edema Neuro - grossly intact, no focal deficits  Lab Results:   [facility] 03/24/12 1823  WBC 13.4*  HGB 12.0  HCT 34.4*  PLT 399     Basename 03/25/12 0404 03/24/12 1823  NA 126* 123*  K 3.8 4.0  CL 89* 86*  CO2 30 28  GLUCOSE 114* 119*  BUN 12 12  CREATININE 0.70 0.68  CALCIUM 8.8 9.4    Studies/Results: Acute Abdominal Series  03/24/2012  *RADIOLOGY REPORT*  Clinical Data: Rectal mass, nausea, vomiting, abdominal pain and distention  ACUTE ABDOMEN SERIES (ABDOMEN 2 VIEW & CHEST 1 VIEW)  Comparison: Chest radiograph 03/20/2012 Correlation:  CT abdomen from PET CT 03/22/2012  Findings: Left subclavian sequential transvenous pacemaker leads project at right atrium and right ventricle. Enlargement of cardiac silhouette. Tortuous aorta. Mediastinal contours and pulmonary vascularity normal. Eventration left diaphragm. Emphysematous changes with right upper lobe scarring. Increased right pleural effusion and basilar atelectasis. No definite pulmonary  infiltrate or pneumothorax. Diffuse osseous demineralization.  Dilated small bowel loops with distention of the stomach with prominent air fluid level. Findings compatible with small bowel obstruction. Colon decompressed with multiple diverticula at the descending and sigmoid colon. No definite bowel wall thickening. On the upright view there is questionably free air under the left hemidiaphragm though this could be an artifact secondary to stomach riding under a focal eventration. Thoracolumbar scoliosis. No definite urinary tract calcification.  IMPRESSION: Increased right pleural effusion and basilar atelectasis. Gastric and proximal small bowel loops distention consistent with small bowel obstruction. Colonic diverticulosis. Small bowel distention has increased since recent PET CT exam. Questionable free air under the left diaphragm versus artifact from eventration; recommend left lateral decubitus abdominal radiograph to exclude free intraperitoneal air.  Critical Value/emergent results were called by telephone at the time of interpretation on 03/24/2012 at 1850 hours to patient's nurse Raiford Noble RN on [address] ward, who verbally acknowledged these results.   Original Report Authenticated By: Lollie Marrow, M.D.    Dg Abd Decub  03/24/2012  *RADIOLOGY REPORT*  Clinical Data: Abnormal radiograph question free intraperitoneal air under left diaphragm versus artifact from eventration  ABDOMEN - 1 VIEW DECUBITUS  Comparison: 03/24/2012  Findings: No free intraperitoneal air on left lateral decubitus view. Small bowel dilatation as previously noted. Atelectasis and effusion at the right base. Osseous demineralization.  IMPRESSION: No free intraperitoneal air identified. Small bowel dilatation/obstruction as previously noted.   Original Report Authenticated By: Lollie Marrow, M.D.  Assessment / Plan: 1.  SBO secondary to malignancy of undetermined primary source  - NPO, IV hydration, NG decompression  - would  ask GYN oncology to evaluate  - will follow with you  - if patient will require laparotomy, would like to coordinate with GYN oncology rather than put patient through two major procedures in short period of time  - will discuss with patient's family today  Velora Heckler, MD, [facility] Office: [phone]  03/25/2012

## 2012-03-26 ENCOUNTER — Other Ambulatory Visit: Payer: Self-pay

## 2012-03-26 ENCOUNTER — Inpatient Hospital Stay (HOSPITAL_COMMUNITY): Payer: Medicare Other

## 2012-03-26 DIAGNOSIS — C801 Malignant (primary) neoplasm, unspecified: Secondary | ICD-10-CM

## 2012-03-26 DIAGNOSIS — E871 Hypo-osmolality and hyponatremia: Secondary | ICD-10-CM | POA: Diagnosis not present

## 2012-03-26 DIAGNOSIS — E46 Unspecified protein-calorie malnutrition: Secondary | ICD-10-CM | POA: Diagnosis not present

## 2012-03-26 DIAGNOSIS — K5669 Other intestinal obstruction: Principal | ICD-10-CM

## 2012-03-26 LAB — COMPREHENSIVE METABOLIC PANEL
ALT: 16 U/L (ref 0–35)
AST: 30 U/L (ref 0–37)
Calcium: 8.6 mg/dL (ref 8.4–10.5)
Potassium: 3.5 mEq/L (ref 3.5–5.1)
Sodium: 131 mEq/L — ABNORMAL LOW (ref 135–145)
Total Protein: 5.6 g/dL — ABNORMAL LOW (ref 6.0–8.3)

## 2012-03-26 LAB — PROTIME-INR: INR: 1.25 (ref 0.00–1.49)

## 2012-03-26 MED ORDER — AMIODARONE LOAD VIA INFUSION
150.0000 mg | Freq: Once | INTRAVENOUS | Status: AC
  Administered 2012-03-26: 150 mg via INTRAVENOUS
  Filled 2012-03-26: qty 83.34

## 2012-03-26 MED ORDER — DILTIAZEM HCL 100 MG IV SOLR
5.0000 mg/h | INTRAVENOUS | Status: DC
  Administered 2012-03-26: 5 mg/h via INTRAVENOUS
  Administered 2012-03-26 – 2012-03-27 (×4): 15 mg/h via INTRAVENOUS
  Administered 2012-03-27: 10 mg/h via INTRAVENOUS
  Administered 2012-03-27: 15 mg/h via INTRAVENOUS
  Administered 2012-03-28: 10 mg/h via INTRAVENOUS
  Administered 2012-03-28 – 2012-03-30 (×3): 5 mg/h via INTRAVENOUS
  Filled 2012-03-26 (×12): qty 100

## 2012-03-26 MED ORDER — ONDANSETRON HCL 4 MG/2ML IJ SOLN
INTRAMUSCULAR | Status: AC
  Filled 2012-03-26: qty 2

## 2012-03-26 MED ORDER — AMIODARONE HCL IN DEXTROSE 360-4.14 MG/200ML-% IV SOLN
1.0000 mg/min | INTRAVENOUS | Status: AC
  Administered 2012-03-26: 1 mg/min via INTRAVENOUS
  Filled 2012-03-26 (×2): qty 200

## 2012-03-26 MED ORDER — AMIODARONE HCL IN DEXTROSE 360-4.14 MG/200ML-% IV SOLN
0.5000 mg/min | INTRAVENOUS | Status: DC
  Administered 2012-03-27 – 2012-03-29 (×5): 0.5 mg/min via INTRAVENOUS
  Filled 2012-03-26 (×11): qty 200

## 2012-03-26 MED ORDER — HEPARIN (PORCINE) IN NACL 100-0.45 UNIT/ML-% IJ SOLN
900.0000 [IU]/h | INTRAMUSCULAR | Status: DC
  Administered 2012-03-26: 900 [IU]/h via INTRAVENOUS
  Filled 2012-03-26: qty 250

## 2012-03-26 MED ORDER — HEPARIN BOLUS VIA INFUSION
3000.0000 [IU] | Freq: Once | INTRAVENOUS | Status: AC
  Administered 2012-03-26: 3000 [IU] via INTRAVENOUS
  Filled 2012-03-26: qty 3000

## 2012-03-26 MED ORDER — METOPROLOL TARTRATE 1 MG/ML IV SOLN
5.0000 mg | INTRAVENOUS | Status: DC | PRN
  Administered 2012-03-26: 5 mg via INTRAVENOUS
  Filled 2012-03-26: qty 5

## 2012-03-26 NOTE — Progress Notes (Signed)
Subjective: The patient is seen and examined today. She is feeling fine this morning. She had an episode of atrial fibrillation with rapid ventricular response with heart rate in the range of 1:30-150 earlier today. She was started on Lopressor and Cardizem drip. She was transferred to a telemetry bed. She will be seen by her cardiologist later today for further evaluation and management of her condition. The patient otherwise is feeling fine. She continues with the NG tube suctioning. She denied having any significant fever or chills.   Objective: Vital signs in last 24 hours: Temp:  [98.1 F (36.7 C)-98.8 F (37.1 C)] 98.1 F (36.7 C) (09/01 0440) Pulse Rate:  [88-149] 118  (09/01 0735) Resp:  [15-20] 17  (09/01 0645) BP: (96-144)/(59-86) 105/79 mmHg (09/01 0735) SpO2:  [90 %-96 %] 96 % (09/01 0735)  Intake/Output from previous day: 08/31 0701 - 09/01 0700 In: 2031 [I.V.:2031] Out: 2600 [Emesis/NG output:2600] Intake/Output this shift:    General appearance: alert, cooperative and no distress Resp: clear to auscultation bilaterally Cardio: irregularly irregular rhythm GI: soft, tender to palpation with distention; bowel sounds normal; no masses,  no organomegaly,  Extremities: extremities normal, atraumatic, no cyanosis or edema  Lab Results:   [facility] 03/24/12 1823  WBC 13.4*  HGB 12.0  HCT 34.4*  PLT 399   BMET  Basename 03/25/12 0404 03/24/12 1823  NA 126* 123*  K 3.8 4.0  CL 89* 86*  CO2 30 28  GLUCOSE 114* 119*  BUN 12 12  CREATININE 0.70 0.68  CALCIUM 8.8 9.4    Studies/Results: Acute Abdominal Series  03/24/2012  *RADIOLOGY REPORT*  Clinical Data: Rectal mass, nausea, vomiting, abdominal pain and distention  ACUTE ABDOMEN SERIES (ABDOMEN 2 VIEW & CHEST 1 VIEW)  Comparison: Chest radiograph 03/20/2012 Correlation:  CT abdomen from PET CT 03/22/2012  Findings: Left subclavian sequential transvenous pacemaker leads project at right atrium and right ventricle.  Enlargement of cardiac silhouette. Tortuous aorta. Mediastinal contours and pulmonary vascularity normal. Eventration left diaphragm. Emphysematous changes with right upper lobe scarring. Increased right pleural effusion and basilar atelectasis. No definite pulmonary infiltrate or pneumothorax. Diffuse osseous demineralization.  Dilated small bowel loops with distention of the stomach with prominent air fluid level. Findings compatible with small bowel obstruction. Colon decompressed with multiple diverticula at the descending and sigmoid colon. No definite bowel wall thickening. On the upright view there is questionably free air under the left hemidiaphragm though this could be an artifact secondary to stomach riding under a focal eventration. Thoracolumbar scoliosis. No definite urinary tract calcification.  IMPRESSION: Increased right pleural effusion and basilar atelectasis. Gastric and proximal small bowel loops distention consistent with small bowel obstruction. Colonic diverticulosis. Small bowel distention has increased since recent PET CT exam. Questionable free air under the left diaphragm versus artifact from eventration; recommend left lateral decubitus abdominal radiograph to exclude free intraperitoneal air.  Critical Value/emergent results were called by telephone at the time of interpretation on 03/24/2012 at 1850 hours to patient's nurse Raiford Noble RN on [address] ward, who verbally acknowledged these results.   Original Report Authenticated By: Lollie Marrow, M.D.    Dg Abd Decub  03/24/2012  *RADIOLOGY REPORT*  Clinical Data: Abnormal radiograph question free intraperitoneal air under left diaphragm versus artifact from eventration  ABDOMEN - 1 VIEW DECUBITUS  Comparison: 03/24/2012  Findings: No free intraperitoneal air on left lateral decubitus view. Small bowel dilatation as previously noted. Atelectasis and effusion at the right base. Osseous demineralization.  IMPRESSION: No  free intraperitoneal  air identified. Small bowel dilatation/obstruction as previously noted.   Original Report Authenticated By: Lollie Marrow, M.D.     Medications: I have reviewed the patient's current medications.  Assessment/Plan: #1 metastatic poorly differentiated carcinoma with questionable gynecologic primary: Evolution by gynecologic oncology and Dr. Truett Perna of her treatment plan. #2 small bowel obstruction: Continue the NG tube suctioning. #3 atrial fibrillation with rapid ventricular response: Currently on Cardizem drip. She will be evaluated by her cardiologist today.   LOS: 2 days    Oseas Detty K. 03/26/2012

## 2012-03-26 NOTE — Progress Notes (Signed)
Pt is alert and oriented this morning ,, denies any chest pain or shortness of breath , HR, 141 per  Monitor. Notified the rapid response team. Nehemiah Settle RN, came and assess the pt and notified the on call physician.  New orders given along with Orders for transfer to tele- floor. Pt remains asymptomatic with elevated heart rate of 111. Notified pt son Jillyn Hidden of patient's  Change on condition.

## 2012-03-26 NOTE — Progress Notes (Addendum)
ANTICOAGULATION CONSULT NOTE - Initial Consult  Pharmacy Consult for Heparin Indication: atrial fibrillation  Allergies  Allergen Reactions  . Pindolol Swelling    Throat swelled up  . Iodine   . Shellfish Allergy   . Vibramycin (Doxycycline Calcium) Nausea Only  . Doxycycline Nausea Only    Patient Measurements: Height: 5\' 1"  (154.9 cm) Weight: 139 lb (63.05 kg) IBW/kg (Calculated) : 47.8  Heparin Dosing Weight: 61kg  Vital Signs: Temp: 97.5 F (36.4 C) (09/01 1345) Temp src: Oral (09/01 1345) BP: 104/70 mmHg (09/01 1345) Pulse Rate: 132  (09/01 1345)  Labs:  Basename 03/26/12 0656 03/25/12 0404 03/24/12 1823  HGB -- -- 12.0  HCT -- -- 34.4*  PLT -- -- 399  APTT -- -- --  LABPROT -- 45.9* 45.0*  INR -- 4.84* 4.71*  HEPARINUNFRC -- -- --  CREATININE 0.69 0.70 0.68  CKTOTAL -- -- --  CKMB -- -- --  TROPONINI -- -- --    Estimated Creatinine Clearance: 46.1 ml/min (by C-G formula based on Cr of 0.69).   Medical History: Past Medical History  Diagnosis Date  . CAD (coronary artery disease)   . Hypertension   . Atrial fibrillation   . Hyperlipidemia   . Rectal carcinoma   . Colon cancer 03/13/12 egd    rectal mass5cm  , 2-3cm proximal to anal verge  . Diverticulosis   . Allergy   . Anxiety     rhematoid  . Vomiting     started on 03/23/12  . Obstruction colon 03/24/12    family told obstruction may be outside colon    Medications:  Prescriptions prior to admission  Medication Sig Dispense Refill  . acetaminophen (TYLENOL) 500 MG tablet Take 500 mg by mouth every 6 (six) hours as needed. For pain.      . metoprolol succinate (TOPROL-XL) 25 MG 24 hr tablet Take 25 mg by mouth daily.      . ondansetron (ZOFRAN) 4 MG tablet Take 4 mg by mouth every 8 (eight) hours as needed.      . senna-docusate (SENOKOT-S) 8.6-50 MG per tablet Take 1 tablet by mouth at bedtime as needed.  30 tablet  12  . simvastatin (ZOCOR) 20 MG tablet Take 20 mg by mouth every  evening.       Scheduled:    . amiodarone  150 mg Intravenous Once  . metoprolol succinate  25 mg Oral Daily  . ondansetron       Infusions:    . sodium chloride 1,000 mL (03/25/12 2237)  . amiodarone (NEXTERONE PREMIX) 360 mg/200 mL dextrose     Followed by  . amiodarone (NEXTERONE PREMIX) 360 mg/200 mL dextrose    . diltiazem (CARDIZEM) infusion 15 mg/hr (03/26/12 1454)   PRN: acetaminophen, acetaminophen, metoprolol, ondansetron (ZOFRAN) IV, phenol, promethazine  Assessment:  57 YOF with PMH recently diagnosed metastatic poorly differentiated carcinoma, questionable gynecologic primary.   Pt is on chronic coumadin PTA for afib, admitted 8/30 w/ N/V and abdominal distention, SBO.   Coumadin dose PTA = 5mg  daily; Coumadin stopped in anticipation of surgery d/t SBO. Pt is currently NPO, has NG. Pt in afib/aflutter.  Pt was recently in the hospital 8/26-27 with dehydration, INR 8/27 = 2.51; INR on admission 8/30 = 4.71, increased to 4.84 8/31 despite no coumadin. Vitamin K SQ 2.5mg  x 1 given 8/31  INR today is 1.25, Heparin per pharmacy protocol ordered  Goal of Therapy:  Heparin level 0.3-0.7 units/ml Monitor platelets by  anticoagulation protocol: Yes   Plan:  Heparin 3000 units then start infusion at 900 units/hour Heparin level 8 hours after start of infusion and Daily Daily CBC  Gwen Her PharmD  [phone] 03/26/2012 3:53 PM    Addendum: Amiodarone drug IX Pt started on amiodarone infusion, pharmacy asked to evaluate for potential drug-drug interactions Only one interaction identified with ondansetron which may increase QTc interval; however, this medication is PRN and none administered yet Will monitor QTc  Gwen Her PharmD  [phone] 03/26/2012 4:43 PM

## 2012-03-26 NOTE — Progress Notes (Signed)
Received call from MT that patients HR trending down in the 90-100's.  With occasional spikes in the 130's.  BP 95/65.  Decreased Cardizem to 10 mg/hr.

## 2012-03-26 NOTE — Progress Notes (Signed)
Patient denies SOB or symptoms from elevated HR.  Transferred to floor on Cardizem drip at 15 mg / hr.  Current HR in 120's and BP 110/74.

## 2012-03-26 NOTE — Progress Notes (Signed)
Patient NG tube drainage from arrival to floor and 1150 is .  No output since 1150am this morning.

## 2012-03-26 NOTE — Progress Notes (Signed)
Patient ID: Victoria [name], female   DOB: 15-Oct-1926, 78 y.o.   MRN: [id number]  General Surgery - [facility] - Progress Note  HD# 3  Subjective: Patient transferred to telemetry with new onset atrial fibrillation.  Continued NPO, NG, IVF.  Has passed flatus this AM.  Objective: Vital signs in last 24 hours: Temp:  [98.1 F (36.7 C)-98.8 F (37.1 C)] 98.1 F (36.7 C) (09/01 0440) Pulse Rate:  [88-149] 130  (09/01 0849) Resp:  [15-20] 17  (09/01 0645) BP: (96-144)/(59-86) 104/71 mmHg (09/01 0849) SpO2:  [90 %-96 %] 96 % (09/01 0735) Last BM Date: 02/22/12  Intake/Output from previous day: 08/31 0701 - 09/01 0700 In: 2031 [I.V.:2031] Out: 2600 [Emesis/NG output:2600]  Exam: HEENT - clear, not icteric Neck - soft Chest - clear bilaterally Cor - RRR, no murmur Abd - moderate distension; BS present; non-tender; soft; no mass palpable Ext - no significant edema Neuro - grossly intact, no focal deficits  Lab Results:   [facility] 03/24/12 1823  WBC 13.4*  HGB 12.0  HCT 34.4*  PLT 399     Basename 03/26/12 0656 03/25/12 0404  NA 131* 126*  K 3.5 3.8  CL 93* 89*  CO2 24 30  GLUCOSE 79 114*  BUN 10 12  CREATININE 0.69 0.70  CALCIUM 8.6 8.8    Studies/Results: Acute Abdominal Series  03/24/2012  *RADIOLOGY REPORT*  Clinical Data: Rectal mass, nausea, vomiting, abdominal pain and distention  ACUTE ABDOMEN SERIES (ABDOMEN 2 VIEW & CHEST 1 VIEW)  Comparison: Chest radiograph 03/20/2012 Correlation:  CT abdomen from PET CT 03/22/2012  Findings: Left subclavian sequential transvenous pacemaker leads project at right atrium and right ventricle. Enlargement of cardiac silhouette. Tortuous aorta. Mediastinal contours and pulmonary vascularity normal. Eventration left diaphragm. Emphysematous changes with right upper lobe scarring. Increased right pleural effusion and basilar atelectasis. No definite pulmonary infiltrate or pneumothorax. Diffuse osseous  demineralization.  Dilated small bowel loops with distention of the stomach with prominent air fluid level. Findings compatible with small bowel obstruction. Colon decompressed with multiple diverticula at the descending and sigmoid colon. No definite bowel wall thickening. On the upright view there is questionably free air under the left hemidiaphragm though this could be an artifact secondary to stomach riding under a focal eventration. Thoracolumbar scoliosis. No definite urinary tract calcification.  IMPRESSION: Increased right pleural effusion and basilar atelectasis. Gastric and proximal small bowel loops distention consistent with small bowel obstruction. Colonic diverticulosis. Small bowel distention has increased since recent PET CT exam. Questionable free air under the left diaphragm versus artifact from eventration; recommend left lateral decubitus abdominal radiograph to exclude free intraperitoneal air.  Critical Value/emergent results were called by telephone at the time of interpretation on 03/24/2012 at 1850 hours to patient's nurse Raiford Noble RN on [address] ward, who verbally acknowledged these results.   Original Report Authenticated By: Lollie Marrow, M.D.    Dg Abd Decub  03/24/2012  *RADIOLOGY REPORT*  Clinical Data: Abnormal radiograph question free intraperitoneal air under left diaphragm versus artifact from eventration  ABDOMEN - 1 VIEW DECUBITUS  Comparison: 03/24/2012  Findings: No free intraperitoneal air on left lateral decubitus view. Small bowel dilatation as previously noted. Atelectasis and effusion at the right base. Osseous demineralization.  IMPRESSION: No free intraperitoneal air identified. Small bowel dilatation/obstruction as previously noted.   Original Report Authenticated By: Lollie Marrow, M.D.     Assessment / Plan: 1.  SBO likely secondary to gynecologic malignancy  -  NPO, NG, IVF  - OOB, ambulate  - will request gynecologic oncology evaluation when available  -  medical oncology attending 2.  New onset afib  - patient has been off cardizem per primary MD  - now on telemetry unit on cardizem infusion  - cardiology to evaluate today  Velora Heckler, MD, [facility] Office: [phone]  03/26/2012

## 2012-03-26 NOTE — Progress Notes (Signed)
Bolus of Amiodarone complete. Started continuous infusion at 1725 at 33.3 ml/hr.  Rate change due at 2200.

## 2012-03-26 NOTE — Progress Notes (Signed)
Called to room 1332 by Pandora RN for pt heart rate 130 taken manually. Upon my arrival pt found resting in bed quietly, alert oriented x 4, denies pain, denies sob or chest tightness. Placed on RRT monitor. Heart rate Afib rate 130-150s with PVCs infrequent. Otherwise Vital signs stable. EKG done yielding AFIB RVR. Dr Cyndie Chime paged, call back received at  0540. Updated on pt status per Pandora RN and myself . Morning labs ordered, transfer to tele ordered and lopressor 5mg  IBP ordered prn. Upon entry of order, pt allergy to pindolol (also a beta blocker) a red flag for order entry of lopressor IVP. Dr Cyndie Chime called back [phone] to verify medication with concurrent allergy. Pt does take oral lopressor without issue, ok with Dr Cyndie Chime to give. 5mg  lopressor given at 0600, heart rate with little change at 0615. Dr Cyndie Chime called back to update on heart rate remaining in 130-140s 0615. Call back received 0620, Dr Cyndie Chime to call cardiology and consult. Call received from Dr [facility] [phone], updated on pt heart rate 130s and some pvcs orders received for cardizem gtt. Bolus 10mg  ordered and given, gtt started at 5mg /hr. Pt transferred to tele. En route to transport vtach beats seen on monitor. Once in room 1440, EKG done and pacer monitor turned on, it does not appear to be true vtach rather just her pacer spiking. Cardiology paged to update. Dawn RN on [address] updated on pt status. Pt resting in bed denies pain.

## 2012-03-26 NOTE — Consult Note (Signed)
Reason for Consult: Afib RVR Referring Physician:  Cardiologist:  Osman Calzadilla  Victoria [name] is an 78 y.o. female.  HPI:   Patient is a elderly Caucasian female admitted with bowel obstruction. She is a history of coronary disease, hypertension, hyperlipidemia, PAF for which she was taking Coumadin.  She she has a ST Jude dual-chamber pacemaker. She was admitted last week for dehydration and nausea, vomiting, diarrhea and discharged last Tuesday.  Her diltiazem had apparently been discontinued. She has recently found rectal mass.    CT scan of abd/pel revealed 5.3 x 6.7 x 6.1 cm mass along the right aspect of the rectum. Two adjacent nodal masses in the right mid abdomen, measuring 8.9 x 7.4 x 7.2 cm in aggregate.   Additional 3.0 x 3.0 x 2.6 cm nodal metastasis posterior to the cecum.  PET scan confirmed hypermetabolic activity.    She is currently in atrial fibrillation with RVR.  Heart rate is in the 130's and she is on IV diltiazem.  She can occassionally feel the palpitations.  She denies currently any CP, N, V(Has NG tube), SOB, orthopnea, abd pain, LEE, dizziness.    Past Medical History  Diagnosis Date  . CAD (coronary artery disease)   . Hypertension   . Atrial fibrillation   . Hyperlipidemia   . Rectal carcinoma   . Colon cancer 03/13/12 egd    rectal mass5cm  , 2-3cm proximal to anal verge  . Diverticulosis   . Allergy   . Anxiety     rhematoid  . Vomiting     started on 03/23/12  . Obstruction colon 03/24/12    family told obstruction may be outside colon    Past Surgical History  Procedure Date  . Pace maker 2010  . Rib removed 1934    Pleurisy and pneumonia  . Partial hysterectomy 1972  . Bladder neck suspension 1986  . Heart catherization [id number]  . Abdominal hysterectomy     both ovaries intact,     Family History  Problem Relation Age of Onset  . Heart disease Mother     Social History:  reports that she has never smoked. She has never used smokeless  tobacco. She reports that she does not drink alcohol or use illicit drugs.  Allergies:  Allergies  Allergen Reactions  . Pindolol Swelling    Throat swelled up  . Iodine   . Shellfish Allergy   . Vibramycin (Doxycycline Calcium) Nausea Only  . Doxycycline Nausea Only    Medications:    . metoprolol succinate  25 mg Oral Daily  . ondansetron         Results for orders placed during the hospital encounter of 03/24/12 (from the past 48 hour(s))  COMPREHENSIVE METABOLIC PANEL     Status: Abnormal   Collection Time   03/24/12  6:23 PM      Component Value Range Comment   Sodium 123 (*) 135 - 145 mEq/L    Potassium 4.0  3.5 - 5.1 mEq/L    Chloride 86 (*) 96 - 112 mEq/L    CO2 28  19 - 32 mEq/L    Glucose, Bld 119 (*) 70 - 99 mg/dL    BUN 12  6 - 23 mg/dL    Creatinine, Ser 1.61  0.50 - 1.10 mg/dL    Calcium 9.4  8.4 - 09.6 mg/dL    Total Protein 6.1  6.0 - 8.3 g/dL    Albumin 3.0 (*) 3.5 - 5.2 g/dL  AST 25  0 - 37 U/L    ALT 15  0 - 35 U/L    Alkaline Phosphatase 139 (*) 39 - 117 U/L    Total Bilirubin 0.3  0.3 - 1.2 mg/dL    GFR calc non Af Amer 79 (*) >90 mL/min    GFR calc Af Amer >90  >90 mL/min   CBC     Status: Abnormal   Collection Time   03/24/12  6:23 PM      Component Value Range Comment   WBC 13.4 (*) 4.0 - 10.5 K/uL    RBC 4.06  3.87 - 5.11 MIL/uL    Hemoglobin 12.0  12.0 - 15.0 g/dL    HCT 16.1 (*) 09.6 - 46.0 %    MCV 84.7  78.0 - 100.0 fL    MCH 29.6  26.0 - 34.0 pg    MCHC 34.9  30.0 - 36.0 g/dL    RDW 04.5  40.9 - 81.1 %    Platelets 399  150 - 400 K/uL   DIFFERENTIAL     Status: Abnormal   Collection Time   03/24/12  6:23 PM      Component Value Range Comment   Neutrophils Relative 87 (*) 43 - 77 %    Neutro Abs 11.7 (*) 1.7 - 7.7 K/uL    Lymphocytes Relative 7 (*) 12 - 46 %    Lymphs Abs 0.9  0.7 - 4.0 K/uL    Monocytes Relative 6  3 - 12 %    Monocytes Absolute 0.9  0.1 - 1.0 K/uL    Eosinophils Relative 0  0 - 5 %    Eosinophils Absolute  0.0  0.0 - 0.7 K/uL    Basophils Relative 0  0 - 1 %    Basophils Absolute 0.0  0.0 - 0.1 K/uL   CA 125     Status: Abnormal   Collection Time   03/24/12  6:23 PM      Component Value Range Comment   CA 125 242.9 (*) 0.0 - 30.2 U/mL   PROTIME-INR     Status: Abnormal   Collection Time   03/24/12  6:23 PM      Component Value Range Comment   Prothrombin Time 45.0 (*) 11.6 - 15.2 seconds    INR 4.71 (*) 0.00 - 1.49   BASIC METABOLIC PANEL     Status: Abnormal   Collection Time   03/25/12  4:04 AM      Component Value Range Comment   Sodium 126 (*) 135 - 145 mEq/L    Potassium 3.8  3.5 - 5.1 mEq/L    Chloride 89 (*) 96 - 112 mEq/L    CO2 30  19 - 32 mEq/L    Glucose, Bld 114 (*) 70 - 99 mg/dL    BUN 12  6 - 23 mg/dL    Creatinine, Ser 9.14  0.50 - 1.10 mg/dL    Calcium 8.8  8.4 - 78.2 mg/dL    GFR calc non Af Amer 79 (*) >90 mL/min    GFR calc Af Amer >90  >90 mL/min   PROTIME-INR     Status: Abnormal   Collection Time   03/25/12  4:04 AM      Component Value Range Comment   Prothrombin Time 45.9 (*) 11.6 - 15.2 seconds    INR 4.84 (*) 0.00 - 1.49   COMPREHENSIVE METABOLIC PANEL     Status: Abnormal   Collection Time  03/26/12  6:56 AM      Component Value Range Comment   Sodium 131 (*) 135 - 145 mEq/L    Potassium 3.5  3.5 - 5.1 mEq/L    Chloride 93 (*) 96 - 112 mEq/L    CO2 24  19 - 32 mEq/L    Glucose, Bld 79  70 - 99 mg/dL    BUN 10  6 - 23 mg/dL    Creatinine, Ser 8.29  0.50 - 1.10 mg/dL    Calcium 8.6  8.4 - 56.2 mg/dL    Total Protein 5.6 (*) 6.0 - 8.3 g/dL    Albumin 2.6 (*) 3.5 - 5.2 g/dL    AST 30  0 - 37 U/L    ALT 16  0 - 35 U/L    Alkaline Phosphatase 130 (*) 39 - 117 U/L    Total Bilirubin 0.3  0.3 - 1.2 mg/dL    GFR calc non Af Amer 79 (*) >90 mL/min    GFR calc Af Amer >90  >90 mL/min   MAGNESIUM     Status: Normal   Collection Time   03/26/12  6:56 AM      Component Value Range Comment   Magnesium 1.9  1.5 - 2.5 mg/dL     Dg Abd 2 Views  07/29/863   *RADIOLOGY REPORT*  Clinical Data: Follow-up small bowel obstruction.  ABDOMEN - 2 VIEW  Comparison: Yesterday.  Findings: Interval nasogastric tube with its tip in the mid to distal stomach.  No significant change in caliber of dilated small bowel loops.  No free peritoneal air.  Medium density material in the large number of colonic diverticula with minimal gas and stool in the colon.  Diffuse osteopenia.  Lumbar spine degenerative changes and scoliosis. Stable right pleural effusion and right basilar airspace opacity.  IMPRESSION:  1.  Little overall change in pattern of small bowel obstruction following nasogastric tube placement. 2.  Stable right pleural fluid and right basilar atelectasis or pneumonia.   Original Report Authenticated By: Darrol Angel, M.D.    Acute Abdominal Series  03/24/2012  *RADIOLOGY REPORT*  Clinical Data: Rectal mass, nausea, vomiting, abdominal pain and distention  ACUTE ABDOMEN SERIES (ABDOMEN 2 VIEW & CHEST 1 VIEW)  Comparison: Chest radiograph 03/20/2012 Correlation:  CT abdomen from PET CT 03/22/2012  Findings: Left subclavian sequential transvenous pacemaker leads project at right atrium and right ventricle. Enlargement of cardiac silhouette. Tortuous aorta. Mediastinal contours and pulmonary vascularity normal. Eventration left diaphragm. Emphysematous changes with right upper lobe scarring. Increased right pleural effusion and basilar atelectasis. No definite pulmonary infiltrate or pneumothorax. Diffuse osseous demineralization.  Dilated small bowel loops with distention of the stomach with prominent air fluid level. Findings compatible with small bowel obstruction. Colon decompressed with multiple diverticula at the descending and sigmoid colon. No definite bowel wall thickening. On the upright view there is questionably free air under the left hemidiaphragm though this could be an artifact secondary to stomach riding under a focal eventration. Thoracolumbar scoliosis. No  definite urinary tract calcification.  IMPRESSION: Increased right pleural effusion and basilar atelectasis. Gastric and proximal small bowel loops distention consistent with small bowel obstruction. Colonic diverticulosis. Small bowel distention has increased since recent PET CT exam. Questionable free air under the left diaphragm versus artifact from eventration; recommend left lateral decubitus abdominal radiograph to exclude free intraperitoneal air.  Critical Value/emergent results were called by telephone at the time of interpretation on 03/24/2012 at 1850 hours to patient's  nurse Raiford Noble RN on [id number], who verbally acknowledged these results.   Original Report Authenticated By: Lollie Marrow, M.D.    Dg Abd Decub  03/24/2012  *RADIOLOGY REPORT*  Clinical Data: Abnormal radiograph question free intraperitoneal air under left diaphragm versus artifact from eventration  ABDOMEN - 1 VIEW DECUBITUS  Comparison: 03/24/2012  Findings: No free intraperitoneal air on left lateral decubitus view. Small bowel dilatation as previously noted. Atelectasis and effusion at the right base. Osseous demineralization.  IMPRESSION: No free intraperitoneal air identified. Small bowel dilatation/obstruction as previously noted.   Original Report Authenticated By: Lollie Marrow, M.D.     Review of Systems  Constitutional: Positive for diaphoresis (She woke up all sweaty but had a lot of blankets on top of her.). Negative for fever.  HENT: Negative for congestion.   Respiratory: Negative for cough and shortness of breath.   Cardiovascular: Positive for palpitations. Negative for chest pain, orthopnea, leg swelling and PND.  Gastrointestinal: Positive for diarrhea (She had been having diarrhea). Negative for nausea, vomiting, abdominal pain and blood in stool.  Neurological: Negative for dizziness.   Blood pressure 104/70, pulse 132, temperature 97.5 F (36.4 C), temperature source Oral, resp. rate 18, height 5\' 1"   (1.549 m), weight 63.05 kg (139 lb), SpO2 95.00%. Physical Exam  Constitutional: She is oriented to person, place, and time. She appears well-developed and well-nourished. No distress.  HENT:  Head: Normocephalic and atraumatic.  Eyes: EOM are normal. Pupils are equal, round, and reactive to light. No scleral icterus.  Neck: Normal range of motion. Neck supple. No JVD present.  Cardiovascular: An irregularly irregular rhythm present. Tachycardia present.   No murmur heard. Pulses:      Radial pulses are 2+ on the right side, and 2+ on the left side.       Dorsalis pedis pulses are 2+ on the right side, and 2+ on the left side.       No carotid bruits  Respiratory: Effort normal and breath sounds normal. She has no wheezes. She has no rales.  GI: Soft. Bowel sounds are normal. She exhibits no distension. There is no tenderness.  Musculoskeletal: She exhibits no edema.  Lymphadenopathy:    She has no cervical adenopathy.  Neurological: She is alert and oriented to person, place, and time. She exhibits normal muscle tone.  Skin: Skin is warm and dry.  Psychiatric: She has a normal mood and affect.    Assessment/Plan: Patient Active Hospital Problem List: Small bowel obstruction (03/24/2012) Atrial fibrillation (03/09/2012) Chronic anticoagulation (03/09/2012) CAD (coronary artery disease) (03/09/2012) S/P placement of cardiac pacemaker (03/09/2012) Hyponatremia (03/21/2012)  Plan:  Increase IV diltiazem to 15mg /hr.  Monitor BP closely as the last was 104/70.  Consider DCCV.  INR supratherapeutic at 4.84(03/25/12).  Hyponatremia improving.     HAGER, BRYAN 03/26/2012, 2:16 PM   I have seen and examined the patient along with Wilburt Finlay, PA .  I have reviewed the chart, notes and new data.  I agree with PA's note.  Abrupt onset of atrial fibrillation earlier today. Rate control improved with IV diltiazem (oral diltiazem had been stopped due to bowel obstruction), but now she appears to be  in atrial flutter with 2:1 AV block and a fixed ventricular rate of 133 bpm despite relatively high dose IV dilt.  Will start IV heparin to reduce likelihood of embolic CVA and also start IV amiodarone (for both rate control and hopefully return to normal rhythm). She had been  fully anticoagulated until just a few days ago when warfarin was stopped in anticipation of surgery and onset of current arrhythmic episode was very recent. ECG normal on 08/29 and HR normal by exam while in hospital until today.  Hopefully will convert to normal rhythm overnight. If still in atrial flutter in AM will try overdrive pacing via her pacemaker. If in AFib, rate control will be easier. There is always the option for elective cardioversion, but logistically this will likely be impossible in next 48 hours.  Thurmon Fair, MD, [facility] [phone] 03/26/2012, 3:33 PM

## 2012-03-27 ENCOUNTER — Inpatient Hospital Stay (HOSPITAL_COMMUNITY): Payer: Medicare Other

## 2012-03-27 ENCOUNTER — Other Ambulatory Visit: Payer: Self-pay

## 2012-03-27 DIAGNOSIS — J029 Acute pharyngitis, unspecified: Secondary | ICD-10-CM

## 2012-03-27 DIAGNOSIS — J189 Pneumonia, unspecified organism: Secondary | ICD-10-CM

## 2012-03-27 LAB — COMPREHENSIVE METABOLIC PANEL
Alkaline Phosphatase: 117 U/L (ref 39–117)
BUN: 9 mg/dL (ref 6–23)
CO2: 23 mEq/L (ref 19–32)
Chloride: 91 mEq/L — ABNORMAL LOW (ref 96–112)
GFR calc Af Amer: >90 mL/min (ref >90)
Glucose, Bld: 99 mg/dL (ref 70–99)
Potassium: 3.4 mEq/L — ABNORMAL LOW (ref 3.5–5.1)
Total Bilirubin: 0.2 mg/dL — ABNORMAL LOW (ref 0.3–1.2)

## 2012-03-27 LAB — HEPARIN LEVEL (UNFRACTIONATED)
Heparin Unfractionated: <0.1 IU/mL — ABNORMAL LOW (ref 0.30–0.70)
Heparin Unfractionated: 0.45 IU/mL (ref 0.30–0.70)

## 2012-03-27 LAB — CBC
Hemoglobin: 12.3 g/dL (ref 12.0–15.0)
MCH: 29.9 pg (ref 26.0–34.0)
RBC: 4.11 MIL/uL (ref 3.87–5.11)
WBC: 12.9 10*3/uL — ABNORMAL HIGH (ref 4.0–10.5)

## 2012-03-27 MED ORDER — HEPARIN BOLUS VIA INFUSION
2000.0000 [IU] | Freq: Once | INTRAVENOUS | Status: AC
  Administered 2012-03-27: 2000 [IU] via INTRAVENOUS
  Filled 2012-03-27: qty 2000

## 2012-03-27 MED ORDER — SODIUM CHLORIDE 0.9 % IV SOLN
INTRAVENOUS | Status: DC
  Administered 2012-03-27 – 2012-03-28 (×4): via INTRAVENOUS
  Filled 2012-03-27 (×5): qty 1000

## 2012-03-27 MED ORDER — SODIUM CHLORIDE 0.9 % IJ SOLN
10.0000 mL | INTRAMUSCULAR | Status: DC | PRN
  Administered 2012-03-28 – 2012-04-08 (×10): 10 mL
  Administered 2012-04-13: 20 mL
  Administered 2012-04-13 – 2012-04-14 (×4): 10 mL

## 2012-03-27 MED ORDER — HEPARIN (PORCINE) IN NACL 100-0.45 UNIT/ML-% IJ SOLN
1100.0000 [IU]/h | INTRAMUSCULAR | Status: DC
  Filled 2012-03-27: qty 250

## 2012-03-27 MED ORDER — HEPARIN (PORCINE) IN NACL 100-0.45 UNIT/ML-% IJ SOLN
1350.0000 [IU]/h | INTRAMUSCULAR | Status: DC
  Administered 2012-03-27 – 2012-03-28 (×4): 1350 [IU]/h via INTRAVENOUS
  Filled 2012-03-27 (×6): qty 250

## 2012-03-27 NOTE — Progress Notes (Signed)
Pt has new orders to ambulate but when she performs minimal activity her heart rate jumps to high 120's.  Pt not ambulated today secondary to unstable heart rate.  MPennington,RN

## 2012-03-27 NOTE — Progress Notes (Signed)
IP PROGRESS NOTE  Subjective:   No nausea. Small amount of flatus yesterday. Mild throat soreness.  Objective: Vital signs in last 24 hours: Blood pressure 102/65, pulse 122, temperature 97.9 F (36.6 C), temperature source Oral, resp. rate 18, height 5\' 1"  (1.549 m), weight 139 lb (63.05 kg), SpO2 96.00%.  Intake/Output from previous day: 04/08/23 0701 - 09/02 0700 In: 1206.1 [I.V.:1206.1] Out: 1080 [Urine:550; Emesis/NG output:530]  Physical Exam:  HEENT: No thrush Lungs: Decreased breath sounds at the right greater than left lower chest, no respiratory distress Cardiac: Irregular Abdomen: Soft and nontender Extremities: No leg edema     Lab Results:  Basename 03/27/12 0130 03/24/12 1823  WBC 12.9* 13.4*  HGB 12.3 12.0  HCT 34.9* 34.4*  PLT 393 399    BMET  Basename 03/27/12 0130 04/07/12 0656  NA 125* 131*  K 3.4* 3.5  CL 91* 93*  CO2 23 24  GLUCOSE 99 79  BUN 9 10  CREATININE 0.62 0.69  CALCIUM 8.3* 8.6   Heparin level less than 0.1 Studies/Results: Dg Abd 2 Views  04/07/2012  *RADIOLOGY REPORT*  Clinical Data: Follow-up small bowel obstruction.  ABDOMEN - 2 VIEW  Comparison: Yesterday.  Findings: Interval nasogastric tube with its tip in the mid to distal stomach.  No significant change in caliber of dilated small bowel loops.  No free peritoneal air.  Medium density material in the large number of colonic diverticula with minimal gas and stool in the colon.  Diffuse osteopenia.  Lumbar spine degenerative changes and scoliosis. Stable right pleural effusion and right basilar airspace opacity.  IMPRESSION:  1.  Little overall change in pattern of small bowel obstruction following nasogastric tube placement. 2.  Stable right pleural fluid and right basilar atelectasis or pneumonia.   Original Report Authenticated By: Darrol Angel, M.D.     Medications: I have reviewed the patient's current medications.  Assessment/Plan:  1. 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 A staging CT scan 03/02/2012 confirmed a mass at the right aspect of the rectum with nodal masses in the abdomen/pelvis  2. Small bowel obstruction secondary to #1-NG tube remains in place  3. Atrophic ablation/flutter-medical therapy per cardiology  4. Anticoagulation-Coumadin on hold, she received low-dose vitamin K in the p.m. on 03/24/2012. Now on IV heparin, dosing per pharmacy  5. Hypokalemia-replete in IV fluids  Plan to continue NG tube decompression for management of the small bowel obstruction. We will consult GYN oncology on 03/28/2012. I anticipate surgery within the next few days. We will decide on systemic treatment options based on the surgical findings and final pathology.  Continue management of the arrhythmia and anticoagulation for recommendations of cardiology.  I updated her son at the bedside today.    LOS: 3 days   Mohamadou Maciver  03/27/2012, 8:05 AM

## 2012-03-27 NOTE — Progress Notes (Addendum)
Harlow Mares, PA aware of pt's potassium of 4.0 and said to address issue in the morning. PA is also aware of pt's QTC being .49 at 2018 and said they normally do not worry until it goes over.50. Pt is resting comfortably and all other vitals are stable. Pt's HR has been in the low 120s and is asymptomatic.

## 2012-03-27 NOTE — CV Procedure (Signed)
Victoria [name],Victoria [name] Female, 78 y.o., 09/11/1926  Location: [facility]  Bed: 1440-01  MRN: [id number]  CSN: [id number]   Date: 03/27/12   Procedure: Noninvasive electrophysiology study (NIPS) - overdrive pacing of atrial flutter  Reason: Arial flutter with 2:1 AV block and difficult rate control  History: 78 yo woman with history of paroxysmal atrial fibrillation and sinus node dysfunction, S/P St. Jude dual chamber pacemaker, now unable to take PO medications and in atrial flutter with rapid ventricular response despite intravenous diltiazem and amiodarone. Intravenous heparin was started shortly after arrhythmia onset. Warfarin had been stopped a few days earlier in anticipation of abdominal surgery.  Baseline intracardiac electrograms show atrial flutter with a cycle length of 230 ms. Atrial pacing at CL of 200 ms, 190 ms, 180 ms and 150 ms all appear to successfully entran the atrium, but atrial flutter quickly recurred. After pacing at the lowest CL (150 ms), the rhythm briefly disorganized to atrial fibrillation, then reorganized to a different circuit of atrial flutter with a slightly longer cycle length. With continued 2:1 AV conduction, the ventricular rate is now 105 bpm.  No further attempts at overdrive pacing were made at this point. Continue IV amiodarone, diltiazem and heparin. Consider electrical cardioversion, but timing of this has to take into consideration the planned abdominal surgery.  No immediate complications occurred.  Thurmon Fair, MD, [facility] [phone] office [phone] pager 03/27/2012 12:17 PM

## 2012-03-27 NOTE — Progress Notes (Signed)
Patient ID: Victoria [name], female   DOB: 1926/11/22, 78 y.o.   MRN: [id number]  General Surgery - [facility] - Progress Note  HD# 4  Subjective: Patient without complaints.  Small BM early this AM.  Son at bedside.  Objective: Vital signs in last 24 hours: Temp:  [97.5 F (36.4 C)-97.9 F (36.6 C)] 97.9 F (36.6 C) (09/02 0442) Pulse Rate:  [122-132] 122  (09/02 0442) Resp:  [18-19] 18  (09/02 0442) BP: (95-104)/(65-71) 102/65 mmHg (09/02 0442) SpO2:  [94 %-96 %] 96 % (09/02 0442) Last BM Date: 02/22/12  Intake/Output from previous day: April 24, 2023 0701 - 09/02 0700 In: 1206.1 [I.V.:1206.1] Out: 1080 [Urine:550; Emesis/NG output:530]  Exam: HEENT - clear, not icteric Neck - soft Chest - clear bilaterally Cor - RRR, no murmur Abd - softer, less distended; NG with thin bilious Ext - no significant edema Neuro - grossly intact, no focal deficits  Lab Results:   Basename 03/27/12 0130 03/24/12 1823  WBC 12.9* 13.4*  HGB 12.3 12.0  HCT 34.9* 34.4*  PLT 393 399     Basename 03/27/12 0130 04-23-2012 0656  NA 125* 131*  K 3.4* 3.5  CL 91* 93*  CO2 23 24  GLUCOSE 99 79  BUN 9 10  CREATININE 0.62 0.69  CALCIUM 8.3* 8.6    Studies/Results: Dg Abd 2 Views  23-Apr-2012  *RADIOLOGY REPORT*  Clinical Data: Follow-up small bowel obstruction.  ABDOMEN - 2 VIEW  Comparison: Yesterday.  Findings: Interval nasogastric tube with its tip in the mid to distal stomach.  No significant change in caliber of dilated small bowel loops.  No free peritoneal air.  Medium density material in the large number of colonic diverticula with minimal gas and stool in the colon.  Diffuse osteopenia.  Lumbar spine degenerative changes and scoliosis. Stable right pleural effusion and right basilar airspace opacity.  IMPRESSION:  1.  Little overall change in pattern of small bowel obstruction following nasogastric tube placement. 2.  Stable right pleural fluid and right basilar atelectasis or  pneumonia.   Original Report Authenticated By: Darrol Angel, M.D.     Assessment / Plan: 1.  SBO secondary to neoplasm - suspect gynecologic malignancy  - AXR with some air into colon, less SB distension  - continue NG tube, IVF  - ambulate in halls today  - appreciate Dr. Truett Perna management  - will request GYN-Oncology assessment tomorrow  Velora Heckler, MD, [facility] Office: [phone]  03/27/2012

## 2012-03-27 NOTE — Progress Notes (Addendum)
ANTICOAGULATION CONSULT NOTE -Follow Up Consult  Pharmacy Consult for Heparin Indication: atrial fibrillation  Allergies  Allergen Reactions  . Pindolol Swelling    Throat swelled up  . Iodine   . Shellfish Allergy   . Vibramycin (Doxycycline Calcium) Nausea Only  . Doxycycline Nausea Only    Patient Measurements: Height: 5\' 1"  (154.9 cm) Weight: 139 lb (63.05 kg) IBW/kg (Calculated) : 47.8  Heparin Dosing Weight: 61kg  Vital Signs: Temp: 97.9 F (36.6 C) (09/02 0442) Temp src: Oral (09/02 0442) BP: 102/65 mmHg (09/02 0442) Pulse Rate: 122  (09/02 0442)  Labs:  Basename 03/27/12 0947 03/27/12 0130 03/26/12 1554 03/26/12 0656 03/25/12 0404 03/24/12 1823  HGB -- 12.3 -- -- -- 12.0  HCT -- 34.9* -- -- -- 34.4*  PLT -- 393 -- -- -- 399  APTT -- -- -- -- -- --  LABPROT -- -- 16.0* -- 45.9* 45.0*  INR -- -- 1.25 -- 4.84* 4.71*  HEPARINUNFRC <0.10* <0.10* -- -- -- --  CREATININE -- 0.62 -- 0.69 0.70 --  CKTOTAL -- -- -- -- -- --  CKMB -- -- -- -- -- --  TROPONINI -- -- -- -- -- --    Estimated Creatinine Clearance: 46.1 ml/min (by C-G formula based on Cr of 0.62).   Medical History: Past Medical History  Diagnosis Date  . CAD (coronary artery disease)   . Hypertension   . Atrial fibrillation   . Hyperlipidemia   . Rectal carcinoma   . Colon cancer 03/13/12 egd    rectal mass5cm  , 2-3cm proximal to anal verge  . Diverticulosis   . Allergy   . Anxiety     rhematoid  . Vomiting     started on 03/23/12  . Obstruction colon 03/24/12    family told obstruction may be outside colon    Medications:  Prescriptions prior to admission  Medication Sig Dispense Refill  . acetaminophen (TYLENOL) 500 MG tablet Take 500 mg by mouth every 6 (six) hours as needed. For pain.      . metoprolol succinate (TOPROL-XL) 25 MG 24 hr tablet Take 25 mg by mouth daily.      . ondansetron (ZOFRAN) 4 MG tablet Take 4 mg by mouth every 8 (eight) hours as needed.      . senna-docusate  (SENOKOT-S) 8.6-50 MG per tablet Take 1 tablet by mouth at bedtime as needed.  30 tablet  12  . simvastatin (ZOCOR) 20 MG tablet Take 20 mg by mouth every evening.       Scheduled:     . amiodarone  150 mg Intravenous Once  . heparin  3,000 Units Intravenous Once  . metoprolol succinate  25 mg Oral Daily  . ondansetron       Infusions:     . amiodarone (NEXTERONE PREMIX) 360 mg/200 mL dextrose 1 mg/min (03/26/12 2038)   Followed by  . amiodarone (NEXTERONE PREMIX) 360 mg/200 mL dextrose 0.5 mg/min (03/27/12 0614)  . diltiazem (CARDIZEM) infusion 15 mg/hr (03/27/12 4098)  . heparin 1,100 Units/hr (03/27/12 0218)  . 0.9 % sodium chloride with kcl    . DISCONTD: sodium chloride 100 mL/hr at 03/27/12 0217  . DISCONTD: heparin 900 Units/hr (03/26/12 1809)   PRN: acetaminophen, acetaminophen, metoprolol, ondansetron (ZOFRAN) IV, phenol, promethazine  Assessment:  29 YOF with PMH recently diagnosed metastatic poorly differentiated carcinoma, questionable gynecologic primary.   Pt is on chronic coumadin PTA for afib, admitted 8/30 w/ N/V and abdominal distention, SBO.  Coumadin dose PTA = 5mg  daily; Coumadin stopped in anticipation of surgery d/t SBO. Pt is currently NPO, has NG. Pt in afib/aflutter.  Pt was recently in the hospital 8/26-27 with dehydration, INR 8/27 = 2.51; INR on admission 8/30 = 4.71, increased to 4.84 8/31 despite no coumadin. Vitamin K SQ 2.5mg  x 1 given 8/31  INR  1.25 on 9/1, Heparin per pharmacy protocol ordered  First Heparin Level < 0.1 (undetectable) with heparin infusing @ 900 units/hr.  Confirmed with RN that there has been no interruption of therapy nor infiltrated IV site  Heparin level remains subtherapeutic at < 0.1 (undetectable)  No bleeding complications noted  Goal of Therapy:  Heparin level 0.3-0.7 units/ml Monitor platelets by anticoagulation protocol: Yes   Plan:  1. Give 2000 unit heparin bolus 2. Increase heparin drip to 1350  units/hr 3. Heparin level 8 hours after rate increase 4. F/U AM CBC 5. Daily AM HLs   Kristina Hazard, Pharm.D. Clinical Oncology Pharmacist  Pager # [phone]  03/27/2012 11:00 AM    ADDENDUM: Heparin level now therapeutic (0.45). Will continue heparin at current rate of 1350 units/hr and recheck heparin level in 6 hours to confirm dose.  Darrol Angel, PharmD Pager: [phone] 03/27/2012 7:56 PM   Addendum:  Assessment:  HL = 0.36 units/ml after 1350 units/hr @ [facility].  No problems reported per RN.  Plan:  Continue heparin @ current rate.  Daily HL/CBC.  Lorenza Evangelist 03/28/2012 3:21 AM

## 2012-03-27 NOTE — Progress Notes (Signed)
ANTICOAGULATION CONSULT NOTE -Follow Up Consult  Pharmacy Consult for Heparin Indication: atrial fibrillation  Allergies  Allergen Reactions  . Pindolol Swelling    Throat swelled up  . Iodine   . Shellfish Allergy   . Vibramycin (Doxycycline Calcium) Nausea Only  . Doxycycline Nausea Only    Patient Measurements: Height: 5\' 1"  (154.9 cm) Weight: 139 lb (63.05 kg) IBW/kg (Calculated) : 47.8  Heparin Dosing Weight: 61kg  Vital Signs: Temp: 97.8 F (36.6 C) (09/01 2144) Temp src: Oral (09/01 2144) BP: 102/65 mmHg (09/02 0130) Pulse Rate: 122  (09/01 2144)  Labs:  Basename 03/27/12 0130 03/26/12 1554 03/26/12 0656 03/25/12 0404 03/24/12 1823  HGB 12.3 -- -- -- 12.0  HCT 34.9* -- -- -- 34.4*  PLT 393 -- -- -- 399  APTT -- -- -- -- --  LABPROT -- 16.0* -- 45.9* 45.0*  INR -- 1.25 -- 4.84* 4.71*  HEPARINUNFRC <0.10* -- -- -- --  CREATININE -- -- 0.69 0.70 0.68  CKTOTAL -- -- -- -- --  CKMB -- -- -- -- --  TROPONINI -- -- -- -- --    Estimated Creatinine Clearance: 46.1 ml/min (by C-G formula based on Cr of 0.69).   Medical History: Past Medical History  Diagnosis Date  . CAD (coronary artery disease)   . Hypertension   . Atrial fibrillation   . Hyperlipidemia   . Rectal carcinoma   . Colon cancer 03/13/12 egd    rectal mass5cm  , 2-3cm proximal to anal verge  . Diverticulosis   . Allergy   . Anxiety     rhematoid  . Vomiting     started on 03/23/12  . Obstruction colon 03/24/12    family told obstruction may be outside colon    Medications:  Prescriptions prior to admission  Medication Sig Dispense Refill  . acetaminophen (TYLENOL) 500 MG tablet Take 500 mg by mouth every 6 (six) hours as needed. For pain.      . metoprolol succinate (TOPROL-XL) 25 MG 24 hr tablet Take 25 mg by mouth daily.      . ondansetron (ZOFRAN) 4 MG tablet Take 4 mg by mouth every 8 (eight) hours as needed.      . senna-docusate (SENOKOT-S) 8.6-50 MG per tablet Take 1 tablet by  mouth at bedtime as needed.  30 tablet  12  . simvastatin (ZOCOR) 20 MG tablet Take 20 mg by mouth every evening.       Scheduled:     . amiodarone  150 mg Intravenous Once  . heparin  3,000 Units Intravenous Once  . metoprolol succinate  25 mg Oral Daily  . ondansetron       Infusions:     . sodium chloride 1,000 mL (03/25/12 2237)  . amiodarone (NEXTERONE PREMIX) 360 mg/200 mL dextrose 1 mg/min (03/26/12 2038)   Followed by  . amiodarone (NEXTERONE PREMIX) 360 mg/200 mL dextrose 0.5 mg/min (03/26/12 2307)  . diltiazem (CARDIZEM) infusion 15 mg/hr (03/26/12 2330)  . heparin 900 Units/hr (03/26/12 1809)   PRN: acetaminophen, acetaminophen, metoprolol, ondansetron (ZOFRAN) IV, phenol, promethazine  Assessment:  59 YOF with PMH recently diagnosed metastatic poorly differentiated carcinoma, questionable gynecologic primary.   Pt is on chronic coumadin PTA for afib, admitted 8/30 w/ N/V and abdominal distention, SBO.   Coumadin dose PTA = 5mg  daily; Coumadin stopped in anticipation of surgery d/t SBO. Pt is currently NPO, has NG. Pt in afib/aflutter.  Pt was recently in the hospital 8/26-27 with dehydration,  INR 8/27 = 2.51; INR on admission 8/30 = 4.71, increased to 4.84 8/31 despite no coumadin. Vitamin K SQ 2.5mg  x 1 given 8/31  INR today is 1.25, Heparin per pharmacy protocol ordered  First Heparin Level < 0.1 (undetectable) with heparin infusing @ 900 units/hr.  Confirmed with RN that there has been no interruption of therapy nor infiltrated IV site  No bleeding complications noted  Goal of Therapy:  Heparin level 0.3-0.7 units/ml Monitor platelets by anticoagulation protocol: Yes   Plan:  Increase heparin drip to 1100 units/hr Heparin level 8 hours after rate increase F/U AM CBC  Terrilee Files, PharmD 03/27/2012 2:11 AM

## 2012-03-27 NOTE — Progress Notes (Signed)
[facility]  DAILY PROGRESS NOTE   Subjective:  She has no complaints. Left forearm IV infiltrated. PICC line pending. Still has copious NGT output. Monitor shows atrial flutter with 2:1 AV block and ventricular rate of 126 bpm ( a little slower due to amiodarone).  Objective:  Temp:  [97.5 F (36.4 C)-97.9 F (36.6 C)] 97.9 F (36.6 C) (09/02 0442) Pulse Rate:  [122-132] 122  (09/02 0442) Resp:  [18-19] 18  (09/02 0442) BP: (95-104)/(65-71) 102/65 mmHg (09/02 0442) SpO2:  [94 %-96 %] 96 % (09/02 0442) Weight change:   Intake/Output from previous day: 09/01 0701 - 09/02 0700 In: 1206.1 [I.V.:1206.1] Out: 1080 [Urine:550; Emesis/NG output:530]  Intake/Output from this shift:    Medications: Current Facility-Administered Medications  Medication Dose Route Frequency Provider Last Rate Last Dose  . acetaminophen (TYLENOL) tablet 650 mg  650 mg Oral Q6H PRN Rana Snare, NP   650 mg at 03/25/12 2015   Or  . acetaminophen (TYLENOL) suppository 650 mg  650 mg Rectal Q6H PRN Rana Snare, NP      . amiodarone (NEXTERONE PREMIX) 360 mg/200 mL dextrose IV infusion  1 mg/min Intravenous Continuous Nalini Alcaraz, MD 33.3 mL/hr at 03/26/12 2038 1 mg/min at 03/26/12 2038   Followed by  . amiodarone (NEXTERONE PREMIX) 360 mg/200 mL dextrose IV infusion  0.5 mg/min Intravenous Continuous Aleksandra Raben, MD 16.7 mL/hr at 03/27/12 0614 0.5 mg/min at 03/27/12 0614  . amiodarone (NEXTERONE) 1.8 mg/mL load via infusion 150 mg  150 mg Intravenous Once Thurmon Fair, MD 500.1 mL/hr at 03/26/12 1701 150 mg at 03/26/12 1701  . diltiazem (CARDIZEM) 100 mg in dextrose 5 % 100 mL infusion  5-15 mg/hr Intravenous Titrated Wilburt Finlay, PA 15 mL/hr at 03/27/12 0614 15 mg/hr at 03/27/12 0614  . heparin ADULT infusion 100 units/mL (25000 units/250 mL)  1,350 Units/hr Intravenous Continuous Lorra Hals Hazard, PHARMD 13.5 mL/hr at 03/27/12 1117 1,350 Units/hr at 03/27/12 1117   . heparin bolus via infusion 2,000 Units  2,000 Units Intravenous Once [facility] Hazard, PHARMD   2,000 Units at 03/27/12 1115  . heparin bolus via infusion 3,000 Units  3,000 Units Intravenous Once Gwen Her, PHARMD   3,000 Units at 03/26/12 1809  . metoprolol (LOPRESSOR) injection 5 mg  5 mg Intravenous Q4H PRN Levert Feinstein, MD   5 mg at 03/26/12 0600  . metoprolol succinate (TOPROL-XL) 24 hr tablet 25 mg  25 mg Oral Daily Rana Snare, NP   25 mg at 03/27/12 0857  . ondansetron (ZOFRAN) 4 MG/2ML injection           . ondansetron (ZOFRAN) injection 4 mg  4 mg Intravenous Q6H PRN Rana Snare, NP      . phenol (CHLORASEPTIC) mouth spray 1 spray  1 spray Mouth/Throat PRN Velora Heckler, MD      . promethazine (PHENERGAN) injection 12.5 mg  12.5 mg Intravenous Q4H PRN Velora Heckler, MD   12.5 mg at 03/24/12 2244  . sodium chloride 0.9 % 1,000 mL with potassium chloride 20 mEq infusion   Intravenous Continuous Ladene Artist, MD      . DISCONTD: 0.9 %  sodium chloride infusion   Intravenous Continuous Rana Snare, NP 100 mL/hr at 03/27/12 0217    . DISCONTD: heparin ADULT infusion 100 units/mL (25000 units/250 mL)  900 Units/hr Intravenous Continuous Gwen Her, PHARMD 9 mL/hr at 03/26/12 1809 900 Units/hr at 03/26/12 1809  .  DISCONTD: heparin ADULT infusion 100 units/mL (25000 units/250 mL)  1,100 Units/hr Intravenous Continuous Leann Trefz Poindexter, PHARMD 11 mL/hr at 03/27/12 0218 1,100 Units/hr at 03/27/12 2130    Physical Exam:  Constitutional: She is oriented to person, place, and time. She appears well-developed and well-nourished. No distress.  HENT:  Head: Normocephalic and atraumatic.  Eyes: EOM are normal. Pupils are equal, round, and reactive to light. No scleral icterus.  Neck: Normal range of motion. Neck supple. No JVD present.  Cardiovascular: A regular rhythm is present. Tachycardia present.  No murmur heard.  Pulses:  Radial pulses are 2+ on  the right side, and 2+ on the left side.  Dorsalis pedis pulses are 2+ on the right side, and 2+ on the left side.  No carotid bruits  Respiratory: Effort normal and breath sounds normal. She has no wheezes. She has no rales.  GI: Soft. Bowel sounds are normal. She exhibits no distension. There is no tenderness.  Musculoskeletal: She exhibits no edema.  Lymphadenopathy:  She has no cervical adenopathy.  Neurological: She is alert and oriented to person, place, and time. She exhibits normal muscle tone.  Skin: Skin is warm and dry.  Psychiatric: She has a normal mood and affect.   Lab Results: Results for orders placed during the hospital encounter of 03/24/12 (from the past 48 hour(s))  COMPREHENSIVE METABOLIC PANEL     Status: Abnormal   Collection Time   03/26/12  6:56 AM      Component Value Range Comment   Sodium 131 (*) 135 - 145 mEq/L    Potassium 3.5  3.5 - 5.1 mEq/L    Chloride 93 (*) 96 - 112 mEq/L    CO2 24  19 - 32 mEq/L    Glucose, Bld 79  70 - 99 mg/dL    BUN 10  6 - 23 mg/dL    Creatinine, Ser 8.65  0.50 - 1.10 mg/dL    Calcium 8.6  8.4 - 78.4 mg/dL    Total Protein 5.6 (*) 6.0 - 8.3 g/dL    Albumin 2.6 (*) 3.5 - 5.2 g/dL    AST 30  0 - 37 U/L    ALT 16  0 - 35 U/L    Alkaline Phosphatase 130 (*) 39 - 117 U/L    Total Bilirubin 0.3  0.3 - 1.2 mg/dL    GFR calc non Af Amer 79 (*) >90 mL/min    GFR calc Af Amer >90  >90 mL/min   MAGNESIUM     Status: Normal   Collection Time   03/26/12  6:56 AM      Component Value Range Comment   Magnesium 1.9  1.5 - 2.5 mg/dL   PROTIME-INR     Status: Abnormal   Collection Time   03/26/12  3:54 PM      Component Value Range Comment   Prothrombin Time 16.0 (*) 11.6 - 15.2 seconds    INR 1.25  0.00 - 1.49   HEPARIN LEVEL (UNFRACTIONATED)     Status: Abnormal   Collection Time   03/27/12  1:30 AM      Component Value Range Comment   Heparin Unfractionated <0.10 (*) 0.30 - 0.70 IU/mL   CBC     Status: Abnormal   Collection Time    03/27/12  1:30 AM      Component Value Range Comment   WBC 12.9 (*) 4.0 - 10.5 K/uL    RBC 4.11  3.87 - 5.11 MIL/uL  Hemoglobin 12.3  12.0 - 15.0 g/dL    HCT 16.1 (*) 09.6 - 46.0 %    MCV 84.9  78.0 - 100.0 fL    MCH 29.9  26.0 - 34.0 pg    MCHC 35.2  30.0 - 36.0 g/dL    RDW 04.5  40.9 - 81.1 %    Platelets 393  150 - 400 K/uL   COMPREHENSIVE METABOLIC PANEL     Status: Abnormal   Collection Time   03/27/12  1:30 AM      Component Value Range Comment   Sodium 125 (*) 135 - 145 mEq/L    Potassium 3.4 (*) 3.5 - 5.1 mEq/L    Chloride 91 (*) 96 - 112 mEq/L    CO2 23  19 - 32 mEq/L    Glucose, Bld 99  70 - 99 mg/dL    BUN 9  6 - 23 mg/dL    Creatinine, Ser 9.14  0.50 - 1.10 mg/dL    Calcium 8.3 (*) 8.4 - 10.5 mg/dL    Total Protein 5.4 (*) 6.0 - 8.3 g/dL    Albumin 2.4 (*) 3.5 - 5.2 g/dL    AST 27  0 - 37 U/L    ALT 14  0 - 35 U/L    Alkaline Phosphatase 117  39 - 117 U/L    Total Bilirubin 0.2 (*) 0.3 - 1.2 mg/dL    GFR calc non Af Amer 82 (*) >90 mL/min    GFR calc Af Amer >90  >90 mL/min   HEPARIN LEVEL (UNFRACTIONATED)     Status: Abnormal   Collection Time   03/27/12  9:47 AM      Component Value Range Comment   Heparin Unfractionated <0.10 (*) 0.30 - 0.70 IU/mL     Imaging: Dg Abd 2 Views  03/27/2012  *RADIOLOGY REPORT*  Clinical Data: Small bowel obstruction  ABDOMEN - 2 VIEW  Comparison:   the previous day's study  Findings: Nasogastric tube loops in the decompressed stomach. Transvenous pacing leads are partially seen.  There are a few gas dilated mid abdominal small bowel loops, with fluid levels on the erect radiograph, stable in number and size since previous exam. No free air.  Normal distribution of gas and stool throughout the colon.  Residual oral contrast opacifies multiple descending and sigmoid diverticula.  Multilevel degenerative changes in the lumbar spine.  IMPRESSION: 1.  Persistent small bowel obstruction, with nasogastric tube in place. 2.  No free air.    Original Report Authenticated By: Osa Craver, M.D.    Dg Abd 2 Views  03/26/2012  *RADIOLOGY REPORT*  Clinical Data: Follow-up small bowel obstruction.  ABDOMEN - 2 VIEW  Comparison: Yesterday.  Findings: Interval nasogastric tube with its tip in the mid to distal stomach.  No significant change in caliber of dilated small bowel loops.  No free peritoneal air.  Medium density material in the large number of colonic diverticula with minimal gas and stool in the colon.  Diffuse osteopenia.  Lumbar spine degenerative changes and scoliosis. Stable right pleural effusion and right basilar airspace opacity.  IMPRESSION:  1.  Little overall change in pattern of small bowel obstruction following nasogastric tube placement. 2.  Stable right pleural fluid and right basilar atelectasis or pneumonia.   Original Report Authenticated By: Darrol Angel, M.D.     Assessment:  1. Principal Problem: 2.  *Small bowel obstruction 3. Active Problems: 4.  Atrial fibrillation with RVR and atrial  flutter with 2:1 AV block 5.  Chronic anticoagulation 6.  CAD (coronary artery disease) 7.  S/P placement of cardiac pacemaker 8.  Hyponatremia 9.   Plan:  We attempted to stop her atrial flutter with overdrive pacing via her pacemaker. Despite apparent entrainment, the arrhythmia recurred. The flutter circuit seems to have modified and the atrial cycle length is now longer, so that despite persistent atrial flutter with 2:1 AV block the ventricular rate is now about 105 bpm. This will likely slow further as amiodarone fully kicks in.   May still need to consider cardioversion, but due to anticoagulation issues will have to coordinate with her surgery schedule (due to be reevaluated tomorrow by a surgical team).  For now continue amiodarone IV and heparin IV and diltiazem IV. If heparin is interrupted for any meaningful time (as it will need to be for surgery), then a TEE would have to precede any cardioversion  attempt.  Time Spent Directly with Patient:  45 minutes  Length of Stay:  LOS: 3 days    Yael Angerer 03/27/2012, 12:02 PM

## 2012-03-27 NOTE — Progress Notes (Signed)
I performed a 12-lead EKG at 0114 to confirm the pt's cardiac rhythm. The EKG results were: sinus tachycardia with 1st degree AV heart block. Pt is still resting comfortably in bed and is asymptomatic despite being tachycardic.

## 2012-03-28 ENCOUNTER — Encounter (HOSPITAL_COMMUNITY): Payer: Self-pay | Admitting: Cardiology

## 2012-03-28 DIAGNOSIS — R1900 Intra-abdominal and pelvic swelling, mass and lump, unspecified site: Secondary | ICD-10-CM | POA: Diagnosis present

## 2012-03-28 LAB — COMPREHENSIVE METABOLIC PANEL
Albumin: 2.2 g/dL — ABNORMAL LOW (ref 3.5–5.2)
BUN: 6 mg/dL (ref 6–23)
Creatinine, Ser: 0.54 mg/dL (ref 0.50–1.10)
GFR calc Af Amer: >90 mL/min (ref >90)
Glucose, Bld: 86 mg/dL (ref 70–99)
Total Bilirubin: 0.2 mg/dL — ABNORMAL LOW (ref 0.3–1.2)
Total Protein: 4.9 g/dL — ABNORMAL LOW (ref 6.0–8.3)

## 2012-03-28 LAB — CBC
HCT: 33.4 % — ABNORMAL LOW (ref 36.0–46.0)
Hemoglobin: 11.4 g/dL — ABNORMAL LOW (ref 12.0–15.0)
MCV: 84.8 fL (ref 78.0–100.0)
RBC: 3.94 MIL/uL (ref 3.87–5.11)
WBC: 14.3 10*3/uL — ABNORMAL HIGH (ref 4.0–10.5)

## 2012-03-28 LAB — MAGNESIUM: Magnesium: 1.5 mg/dL (ref 1.5–2.5)

## 2012-03-28 LAB — GLUCOSE, CAPILLARY: Glucose-Capillary: 76 mg/dL (ref 70–99)

## 2012-03-28 MED ORDER — SODIUM CHLORIDE 0.9 % IV SOLN
INTRAVENOUS | Status: AC
  Administered 2012-03-28: 10:00:00 via INTRAVENOUS
  Filled 2012-03-28 (×2): qty 1000

## 2012-03-28 MED ORDER — CLINIMIX E/DEXTROSE (5/20) 5 % IV SOLN
INTRAVENOUS | Status: AC
  Administered 2012-03-28: 19:00:00 via INTRAVENOUS
  Filled 2012-03-28: qty 1000

## 2012-03-28 MED ORDER — INSULIN ASPART 100 UNIT/ML ~~LOC~~ SOLN
0.0000 [IU] | Freq: Four times a day (QID) | SUBCUTANEOUS | Status: DC
  Administered 2012-03-29: 2 [IU] via SUBCUTANEOUS
  Administered 2012-03-29 – 2012-03-30 (×7): 1 [IU] via SUBCUTANEOUS
  Administered 2012-03-31 – 2012-04-01 (×4): 2 [IU] via SUBCUTANEOUS
  Administered 2012-04-01 (×2): 1 [IU] via SUBCUTANEOUS

## 2012-03-28 MED ORDER — TRAVASOL 10 % IV SOLN: INTRAVENOUS | Status: DC

## 2012-03-28 MED ORDER — SODIUM CHLORIDE 0.9 % IV SOLN
INTRAVENOUS | Status: AC
  Administered 2012-03-28 – 2012-03-29 (×2): via INTRAVENOUS
  Filled 2012-03-28 (×2): qty 1000

## 2012-03-28 MED ORDER — POTASSIUM CHLORIDE 10 MEQ/50ML IV SOLN
10.0000 meq | INTRAVENOUS | Status: AC
  Administered 2012-03-28 (×4): 10 meq via INTRAVENOUS
  Filled 2012-03-28 (×4): qty 50

## 2012-03-28 NOTE — Progress Notes (Addendum)
IP PROGRESS NOTE  Subjective:  Flatus this morning. No nausea or pain.   Objective: Vital signs in last 24 hours: Blood pressure 116/75, pulse 85, temperature 97.9 F (36.6 C), temperature source Oral, resp. rate 18, height 5\' 1"  (1.549 m), weight 139 lb (63.05 kg), SpO2 93.00%.  Intake/Output from previous day: 09/02 0701 - 09/03 0700 In: 3384.2 [I.V.:3384.2] Out: 1325 [Urine:725; Emesis/NG output:600]  Physical Exam:  HEENT: No thrush Lungs: Decreased breath sounds at the right greater than left lower chest, no respiratory distress Cardiac: Irregular Abdomen: Soft and nontender Extremities: No leg edema     Lab Results:  Basename 03/28/12 0156 03/27/12 0130  WBC 14.3* 12.9*  HGB 11.4* 12.3  HCT 33.4* 34.9*  PLT 377 393    BMET  Basename 03/28/12 0156 03/27/12 0130  NA 128* 125*  K 3.4* 3.4*  CL 95* 91*  CO2 21 23  GLUCOSE 86 99  BUN 6 9  CREATININE 0.54 0.62  CALCIUM 7.9* 8.3*   Heparin level less than 0. 36 Studies/Results: Dg Chest Port 1 View  03/27/2012  *RADIOLOGY REPORT*  Clinical Data: Bedside PICC placement.  PORTABLE CHEST - 1 VIEW 03/27/2012 1504 hours:  Comparison: Two-view chest x-ray 03/20/2012.  Findings: Right arm PICC tip projects over the mid SVC. Nasogastric tube looped in the stomach with its tip in the mid body.  Cardiac silhouette enlarged but stable, allowing for differences in technique.  Interval development of atelectasis in both lung bases.  Stable scarring in the right upper lobe.  Lungs otherwise clear.  Left subclavian dual lead transvenous pacemaker unchanged and appears intact.  IMPRESSION:  1.  Right arm PICC tip projects over the mid SVC. 2.  New bibasilar atelectasis.  No acute cardiopulmonary disease otherwise. 3.  Stable cardiomegaly without pulmonary edema. 4.  Nasogastric tube looped in the stomach with its tip in the mid body.   Original Report Authenticated By: Arnell Sieving, M.D.    Dg Abd 2 Views  03/27/2012   *RADIOLOGY REPORT*  Clinical Data: Small bowel obstruction  ABDOMEN - 2 VIEW  Comparison:   the previous day's study  Findings: Nasogastric tube loops in the decompressed stomach. Transvenous pacing leads are partially seen.  There are a few gas dilated mid abdominal small bowel loops, with fluid levels on the erect radiograph, stable in number and size since previous exam. No free air.  Normal distribution of gas and stool throughout the colon.  Residual oral contrast opacifies multiple descending and sigmoid diverticula.  Multilevel degenerative changes in the lumbar spine.  IMPRESSION: 1.  Persistent small bowel obstruction, with nasogastric tube in place. 2.  No free air.   Original Report Authenticated By: Osa Craver, M.D.    Dg Abd 2 Views  03/26/2012  *RADIOLOGY REPORT*  Clinical Data: Follow-up small bowel obstruction.  ABDOMEN - 2 VIEW  Comparison: Yesterday.  Findings: Interval nasogastric tube with its tip in the mid to distal stomach.  No significant change in caliber of dilated small bowel loops.  No free peritoneal air.  Medium density material in the large number of colonic diverticula with minimal gas and stool in the colon.  Diffuse osteopenia.  Lumbar spine degenerative changes and scoliosis. Stable right pleural effusion and right basilar airspace opacity.  IMPRESSION:  1.  Little overall change in pattern of small bowel obstruction following nasogastric tube placement. 2.  Stable right pleural fluid and right basilar atelectasis or pneumonia.   Original Report Authenticated By: Viviann Spare  Julianne Handler, M.D.     Medications: I have reviewed the patient's current medications.  Assessment/Plan:  1. 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 A staging CT scan 03/02/2012 confirmed a mass at the right aspect of the rectum with nodal masses in  the abdomen/pelvis. Elevated CA 125  2. Small bowel obstruction secondary to #1-NG tube remains in place  3. atrial fibrillation/flutter-medical therapy per cardiology  4. Anticoagulation-Coumadin on hold, she received low-dose vitamin K in the p.m. on 03/24/2012. Now on IV heparin, dosing per pharmacy, the heparin level is therapeutic this morning  5. Hypokalemia-the potassium is still low, normal magnesium, increase potassium in IV fluids  6. Nutrition-no nutrition for the past week. The albumin is low. I will discuss initiation of TNA with the surgical service.  The plan is to consult GYN oncology today and coordinate surgery with GYN oncology/Gen. surgery.  Continue management of the arrhythmia and anticoagulation for recommendations of cardiology.  I updated her son at the bedside today.    LOS: 4 days   Victoria [name]  03/28/2012, 8:02 AM

## 2012-03-28 NOTE — Progress Notes (Signed)
Due to pt's QTC being at 0.55, PA on call, L. Stone gave instruction to RN to reduce cardizem drip rate to 5mg /hr. RN will continue to monitor HR and rhythm closely.

## 2012-03-28 NOTE — Progress Notes (Signed)
Pt's HR has been ranging from the 80s to the low 100s. Cardizem drip is still infusing at 10 mL/hr because HR is in the target range of 65-105. Pt's HR will stay in the 80s then minutes later shoot back up in the 100s. Pt's QTC was 0.55 at 0405, on-call MD was paged and awaiting response. Pt is asymptomatic and resting in bed.

## 2012-03-28 NOTE — Consult Note (Signed)
Reason for Consult: Gyn Malignancy Referring Physician:Bradley Truett Perna, MD  HPI Victoria [name] is a lovely 78 y.o. female recently found to have a rectal mass. She noted a change in bowel habits- intermittent diarrhea-  in May 2013. Barium enema on 12/22/2011 showed moderately redundant colon with moderate to severe diverticulosis primarily from the splenic flexure and throughout the sigmoid. She developed rectal bleeding in June 2013. Pelvic examination by her gynecologist was notable for a rectal mass. Patient reports good appetite, abdominal bloating, 16 pound weight loss since Dec 2012.    CT scans of the abdomen and pelvis on 03/02/2012 showed a rectal mass measuring 6.7 cm and 2 adjacent nodal masses in the right mid abdomen measuring 8.9 x 7.4 x 7.2 cm suspicious for nodal metastases and an additional 3.0 x 3.0 x 2.6 cm nodal metastasis posterior to the cecum. A sigmoidoscopy on 03/13/2012 showed a 5 cm malignant appearing mass with central ulceration in the rectum about 2-3 cm proximal to the anal verge. Pathology showed moderate to poorly differentiated adenocarcinoma with ulceration and prolapse changes.  Further immuniohistochemistry staining noted malignant cells  positive for cytokeratin 7, estrogen receptor, and WT-1. They are negative for cytokeratin 20 and CDX-2. This immunohistochemical profile strongly suggests a gynecologic primary.   PET CT 03/16/2012 Abdomen/Pelvis: No abnormal hypermetabolic activity within the  liver, pancreas, adrenal glands, or spleen. Within the right lower quadrant of the abdomen there is a large mass(s) measuring 9.2 x  9.4 cm. Large necrotic tumor associated with the right side of the rectum.  This mass measures 8.0 x 8.4 cm and appears centrally necrotic. Right lower quadrant mass has mass effect upon the right ureter. There is also mass effect upon the small bowel loops which are  increased and caliber and contain multiple fluid levels. [place]:  No focal  hypermetabolic activity to suggest skeletal metastasis.   PET scan on 03/22/2012 showed an intensely hypermetabolic large mass along the right aspect of the rectum, intensely hypermetabolic right iliac fossa mass and increased caliber of small bowel loops containing air-fluid levels.  She presented to Dr. Kalman Drape office on 03/24/2012 and reported  report abdominal distention and persistent nausea/vomiting and was admitted to the hospital.    CA125 225.  The patient is currently receiving nasogastric bowel decompression. She denies vaginal or rectal bleeding denies abdominal pain or discomfort denies back pain denies changes in her bladder habits.    Past Medical History  Diagnosis Date  . CAD (coronary artery disease)   . Hypertension   . Atrial fibrillation   . Hyperlipidemia   . Rectal carcinoma   . Colon cancer 03/13/12 egd    rectal mass5cm  , 2-3cm proximal to anal verge  . Diverticulosis   . Allergy   . Anxiety     rhematoid  . Vomiting     started on 03/23/12  . Obstruction colon 03/24/12    family told obstruction may be outside colon    Past Surgical History  Procedure Date  . Pace maker 2010  . Rib removed 1934    Pleurisy and pneumonia  . Partial hysterectomy 1972  . Bladder neck suspension 1986  . Heart catherization [id number]  . Abdominal hysterectomy     both ovaries intact,     Family History  Problem Relation Age of Onset  . Heart disease Mother     Social History:  reports that she has never smoked. She has never used smokeless tobacco. She reports that she does  not drink alcohol or use illicit drugs. Lives with her 60 year old husband and his his primary caretaker.  Currently drives and performs all her activities of daily living.  Allergies:  Allergies  Allergen Reactions  . Pindolol Swelling    Throat swelled up  . Iodine   . Shellfish Allergy   . Vibramycin (Doxycycline Calcium) Nausea Only  . Doxycycline Nausea Only   PGYN Hx:  Menarche at  15 years. G2P2 regular menses with severe dysmenorrhea until age 83 when she underwent hysterectomy and ablation of endometriotic lesions.   No LMP recorded. Patient has had a hysterectomy.    Results for orders placed during the hospital encounter of 03/24/12 (from the past 48 hour(s))  PROTIME-INR     Status: Abnormal   Collection Time   03/26/12  3:54 PM      Component Value Range Comment   Prothrombin Time 16.0 (*) 11.6 - 15.2 seconds    INR 1.25  0.00 - 1.49   HEPARIN LEVEL (UNFRACTIONATED)     Status: Abnormal   Collection Time   03/27/12  1:30 AM      Component Value Range Comment   Heparin Unfractionated <0.10 (*) 0.30 - 0.70 IU/mL   CBC     Status: Abnormal   Collection Time   03/27/12  1:30 AM      Component Value Range Comment   WBC 12.9 (*) 4.0 - 10.5 K/uL    RBC 4.11  3.87 - 5.11 MIL/uL    Hemoglobin 12.3  12.0 - 15.0 g/dL    HCT 29.5 (*) 62.1 - 46.0 %    MCV 84.9  78.0 - 100.0 fL    MCH 29.9  26.0 - 34.0 pg    MCHC 35.2  30.0 - 36.0 g/dL    RDW 30.8  65.7 - 84.6 %    Platelets 393  150 - 400 K/uL   COMPREHENSIVE METABOLIC PANEL     Status: Abnormal   Collection Time   03/27/12  1:30 AM      Component Value Range Comment   Sodium 125 (*) 135 - 145 mEq/L    Potassium 3.4 (*) 3.5 - 5.1 mEq/L    Chloride 91 (*) 96 - 112 mEq/L    CO2 23  19 - 32 mEq/L    Glucose, Bld 99  70 - 99 mg/dL    BUN 9  6 - 23 mg/dL    Creatinine, Ser 9.62  0.50 - 1.10 mg/dL    Calcium 8.3 (*) 8.4 - 10.5 mg/dL    Total Protein 5.4 (*) 6.0 - 8.3 g/dL    Albumin 2.4 (*) 3.5 - 5.2 g/dL    AST 27  0 - 37 U/L    ALT 14  0 - 35 U/L    Alkaline Phosphatase 117  39 - 117 U/L    Total Bilirubin 0.2 (*) 0.3 - 1.2 mg/dL    GFR calc non Af Amer 82 (*) >90 mL/min    GFR calc Af Amer >90  >90 mL/min   HEPARIN LEVEL (UNFRACTIONATED)     Status: Abnormal   Collection Time   03/27/12  9:47 AM      Component Value Range Comment   Heparin Unfractionated <0.10 (*) 0.30 - 0.70 IU/mL   HEPARIN LEVEL  (UNFRACTIONATED)     Status: Normal   Collection Time   03/27/12  7:24 PM      Component Value Range Comment   Heparin Unfractionated 0.45  0.30 - 0.70 IU/mL   CBC     Status: Abnormal   Collection Time   03/28/12  1:56 AM      Component Value Range Comment   WBC 14.3 (*) 4.0 - 10.5 K/uL    RBC 3.94  3.87 - 5.11 MIL/uL    Hemoglobin 11.4 (*) 12.0 - 15.0 g/dL    HCT 16.1 (*) 09.6 - 46.0 %    MCV 84.8  78.0 - 100.0 fL    MCH 28.9  26.0 - 34.0 pg    MCHC 34.1  30.0 - 36.0 g/dL    RDW 04.5  40.9 - 81.1 %    Platelets 377  150 - 400 K/uL   HEPARIN LEVEL (UNFRACTIONATED)     Status: Normal   Collection Time   03/28/12  1:56 AM      Component Value Range Comment   Heparin Unfractionated 0.36  0.30 - 0.70 IU/mL   COMPREHENSIVE METABOLIC PANEL     Status: Abnormal   Collection Time   03/28/12  1:56 AM      Component Value Range Comment   Sodium 128 (*) 135 - 145 mEq/L    Potassium 3.4 (*) 3.5 - 5.1 mEq/L    Chloride 95 (*) 96 - 112 mEq/L    CO2 21  19 - 32 mEq/L    Glucose, Bld 86  70 - 99 mg/dL    BUN 6  6 - 23 mg/dL    Creatinine, Ser 9.14  0.50 - 1.10 mg/dL    Calcium 7.9 (*) 8.4 - 10.5 mg/dL    Total Protein 4.9 (*) 6.0 - 8.3 g/dL    Albumin 2.2 (*) 3.5 - 5.2 g/dL    AST 20  0 - 37 U/L    ALT 10  0 - 35 U/L    Alkaline Phosphatase 103  39 - 117 U/L    Total Bilirubin 0.2 (*) 0.3 - 1.2 mg/dL    GFR calc non Af Amer 86 (*) >90 mL/min    GFR calc Af Amer >90  >90 mL/min   MAGNESIUM     Status: Normal   Collection Time   03/28/12  1:56 AM      Component Value Range Comment   Magnesium 1.5  1.5 - 2.5 mg/dL    ROS  denies headache visual changes denies adenopathy back pain, reports intermittent flatus denies rectal or vaginal bleeding. Denies hematuria.  Denies shortness of breath or chest pain.  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  Blood pressure 128/87, pulse 112, temperature 97.9 F (36.6 C), temperature source Oral, resp. rate 18, height 5\' 1"  (1.549 m), weight 139 lb (63.05 kg), SpO2 93.00%. Body mass index is 26.26 kg/(m^2).  Physical Exam WD female in NAD HEENT:  Normocephalic, NROM LN;  No cervical supraclavicular or inguinal adenopathy ABDNOMEN:  Soft distended no palpable omental cake no tenderness or guarding BACK: No CVA tenderness CHEST:  Symmetrical chest excursions HEART: RRR PELVIC:  Normal external genitalia Bartholin's urethra and Skene's glands.  No palpable masses within the vagina however in the distal vagina there is a palpable rectal mass. RECTAL:  Good tone and a 6 cm anterior intraluminal mass palpated approximately 3 cm from the anus.  Cul de sac fullness appreciated. EXT:  No CCE  Assessment/Plan: [age] year old with  large necrotic pelvic mass with rectal erosion and SBO. The CA 125 is elevated and although the physical findings are strongly suggestive of a rectal cancer the immunostains suggest GYN pathology. This patient's history is notable for prior abdominal hysterectomy and ablation of severe endometriosis. The ovaries were maintained. Imaging is remarkable for a 9 cm right lower quadrant pelvic mass. Significant adenopathy is noted.  There is no report of ascites omental cake or metastatic disease to the chest liver or spleen. The differential diagnosis includes ovarian, fallopian tube or peritoneal primary. This patient with multiple comorbid conditions and albumin of 2.4.  With an operative approach at this time although the small bowel obstruction could be relieved it is unlikely that she would be optimally debulked .  The options presented to the patient are as follows:  #1 neoadjuvant chemotherapy with weekly Taxol , and every three-week carboplatin for 3-4 cycles. G tube or PEG placement will be necessary for the management of the obstruction until there is a response from  chemotherapy.   An interval debulking was then be performed followed by an additional three to 4 cycles of chemotherapy . #2 laparotomy with resolution of the bowel obstruction and rectal resection, bilateral salpingo-oophorectomy omentectomy and debulking followed by chemotherapy. I'm concerned that given this patient's age and her nutritional status the time that postoperative course may be prolonged and may delay initiation of chemotherapy substantially to be not of benefit. #3 best supportive care.  The options were presented to the patient her son and his wife.  A discussion was also held with Dr. Georgana Curio and Dr.Sherrill.  Thank you for allowing me to participate in the care of this very lovely lady.   Victoria [name] [facility] 03/28/2012, 10:53 AM

## 2012-03-28 NOTE — Progress Notes (Signed)
Subjective: Good spirits despite news of pelvic mass (likely from Ovarian Ca) as etiology for SBO.   Seems fine with NGT.  Objective: Vital signs in last 24 hours: Temp:  [97.9 F (36.6 C)-98 F (36.7 C)] 97.9 F (36.6 C) (09/03 0518) Pulse Rate:  [70-112] 70  (09/03 1338) Resp:  [18] 18  (09/03 0518) BP: (116-128)/(74-87) 128/81 mmHg (09/03 1338) SpO2:  [93 %-95 %] 93 % (09/03 0518) Weight change:  Last BM Date: 03/27/12 Intake/Output from previous day: +1844 09/02 0701 - 09/03 0700 In: 3384.2 [I.V.:3384.2] Out: 1325 [Urine:725; Emesis/NG output:600] Intake/Output this shift: Total I/O In: 0  Out: 225 [Urine:225]  Tele:mostely rate controlled Aflutter with RBBB, PVCs  PE: General appearance: alert, cooperative, appears stated age and no distress Neck: no adenopathy, no carotid bruit, supple, symmetrical, trachea midline, thyroid not enlarged, symmetric, no tenderness/mass/nodules and pulsatile JVP, but not distended Lungs: clear to auscultation bilaterally and normal percussion bilaterally Heart: regular rate and rhythm, S1, S2 normal and normal rate, no M/R/G Abdomen: normal BS (but NGT in place), non-distended &non-tender Extremities: extremities normal, atraumatic, no cyanosis or edema Pulses: 2+ and symmetric Neurologic: Grossly normal   Lab Results:  Basename 03/28/12 0156 03/27/12 0130  WBC 14.3* 12.9*  HGB 11.4* 12.3  HCT 33.4* 34.9*  PLT 377 393   BMET  Basename 03/28/12 0156 03/27/12 0130  NA 128* 125*  K 3.4* 3.4*  CL 95* 91*  CO2 21 23  GLUCOSE 86 99  BUN 6 9  CREATININE 0.54 0.62  CALCIUM 7.9* 8.3*   No results found for this basename: TROPONINI:2,CK,MB:2 in the last 72 hours  Lab Results  Component Value Date   CHOL  Value: 142        ATP III CLASSIFICATION:  <200     mg/dL   Desirable  161-096  mg/dL   Borderline High  >=045    mg/dL   High 40/98/1191   HDL 43 05/15/2008   LDLCALC  Value: 82        Total Cholesterol/HDL:CHD Risk Coronary  Heart Disease Risk Table                     Men   Women  1/2 Average Risk   3.4   3.3 05/15/2008   TRIG 85 05/15/2008   CHOLHDL 3.3 05/15/2008   No results found for this basename: HGBA1C       Hepatic Function Panel  Basename 03/28/12 0156  PROT 4.9*  ALBUMIN 2.2*  AST 20  ALT 10  ALKPHOS 103  BILITOT 0.2*  BILIDIR --  IBILI --    EKG: Orders placed in visit on 03/27/12  . EKG 12-LEAD    Studies/Results: Dg Chest Port 1 View  03/27/2012  *RADIOLOGY REPORT*  Clinical Data: Bedside PICC placement.  PORTABLE CHEST - 1 VIEW 03/27/2012 1504 hours:  Comparison: Two-view chest x-ray 03/20/2012.  Findings: Right arm PICC tip projects over the mid SVC. Nasogastric tube looped in the stomach with its tip in the mid body.  Cardiac silhouette enlarged but stable, allowing for differences in technique.  Interval development of atelectasis in both lung bases.  Stable scarring in the right upper lobe.  Lungs otherwise clear.  Left subclavian dual lead transvenous pacemaker unchanged and appears intact.  IMPRESSION:  1.  Right arm PICC tip projects over the mid SVC. 2.  New bibasilar atelectasis.  No acute cardiopulmonary disease otherwise. 3.  Stable cardiomegaly without pulmonary edema. 4.  Nasogastric  tube looped in the stomach with its tip in the mid body.   Original Report Authenticated By: Arnell Sieving, M.D.    Dg Abd 2 Views  03/27/2012  *RADIOLOGY REPORT*  Clinical Data: Small bowel obstruction  ABDOMEN - 2 VIEW  Comparison:   the previous day's study  Findings: Nasogastric tube loops in the decompressed stomach. Transvenous pacing leads are partially seen.  There are a few gas dilated mid abdominal small bowel loops, with fluid levels on the erect radiograph, stable in number and size since previous exam. No free air.  Normal distribution of gas and stool throughout the colon.  Residual oral contrast opacifies multiple descending and sigmoid diverticula.  Multilevel degenerative changes  in the lumbar spine.  IMPRESSION: 1.  Persistent small bowel obstruction, with nasogastric tube in place. 2.  No free air.   Original Report Authenticated By: Osa Craver, M.D.     Medications: I have reviewed the patient's current medications.    . insulin aspart  0-9 Units Subcutaneous Q6H  . metoprolol succinate  25 mg Oral Daily  . potassium chloride  10 mEq Intravenous Q1 Hr x 4   Assessment/Plan: Principal Problem:  *Small bowel obstruction Active Problems:  Rectal carcinoma  Atrial fibrillation  Chronic anticoagulation  CAD (coronary artery disease)  S/P placement of cardiac pacemaker  Hyponatremia  Pelvic mass in female, necrotic  PLAN: Please see consult note by Dr. Nelly Rout. Pt with A. Flutter attempts at overdrive pacing were not successful. Pt on IV amiodarone, IV dilt and IV Heparin.   Poss. abd surgery may be in future, pt. And family has been given 3 options. Once their decision is made, we would be available for DCCV if needed.  Currently rate is mostly controlled and BP stable. She has also rec'd po toprol.  LOS: 4 days   INGOLD,LAURA R 03/28/2012, 6:52 PM  I have seen & examined the patient along with Nada Boozer, NP. I agree with her findings & exam above.  She finally seems to be rate controlled with IV amiodarone & diltiazem gtt along with NTG toprol. Remains on IV Heparin for anticoagulation.  The plan seems to be for short term TPN (~1 month or so) with PEG tube for decompression to allow for Chemotherapy followed by potential debulking Sgx.   Will continue current med Rx for now including heparin -- if there is potential for ~6wks of therapeutic enoxaparin (as she will not likely be taking warfarin) prior to planned OR time, could still consider TEE guided DCCV by Friday if she remains in Aflutter -- after Amiodarone loading.  May well cardiovert chemically.    We will continue to follow along & decide plan for Friday as her course & plan pan  out.  Marykay Lex, M.D., M.S. [facility] [address] [place], [place]  [address]  [phone] Pager # [phone]  03/28/2012 7:49 PM

## 2012-03-28 NOTE — Progress Notes (Addendum)
PARENTERAL NUTRITION CONSULT NOTE - INITIAL  Pharmacy Consult for TNA  Indication: SBO 2/2 neoplasm  Allergies  Allergen Reactions  . Pindolol Swelling    Throat swelled up  . Iodine   . Shellfish Allergy   . Vibramycin (Doxycycline Calcium) Nausea Only  . Doxycycline Nausea Only   Patient Measurements: Height: 5\' 1"  (154.9 cm) Weight: 139 lb (63.05 kg) IBW/kg (Calculated) : 47.8   Vital Signs: Temp: 97.9 F (36.6 C) (09/03 0518) Temp src: Oral (09/03 0518) BP: 128/81 mmHg (09/03 1338) Pulse Rate: 70  (09/03 1338) Intake/Output from previous day: 09/02 0701 - 09/03 0700 In: 3384.2 [I.V.:3384.2] Out: 1325 [Urine:725; Emesis/NG output:600] Intake/Output from this shift: Total I/O In: 0  Out: 225 [Urine:225]  Labs:  [facility] 03/28/12 0156 03/27/12 0130 03/26/12 1554  WBC 14.3* 12.9* --  HGB 11.4* 12.3 --  HCT 33.4* 34.9* --  PLT 377 393 --  APTT -- -- --  INR -- -- 1.25     Basename 03/28/12 0156 03/27/12 0130 03/26/12 0656  NA 128* 125* 131*  K 3.4* 3.4* 3.5  CL 95* 91* 93*  CO2 21 23 24   GLUCOSE 86 99 79  BUN 6 9 10   CREATININE 0.54 0.62 0.69  LABCREA -- -- --  CREAT24HRUR -- -- --  CALCIUM 7.9* 8.3* 8.6  MG 1.5 -- 1.9  PHOS -- -- --  PROT 4.9* 5.4* 5.6*  ALBUMIN 2.2* 2.4* 2.6*  AST 20 27 30   ALT 10 14 16   ALKPHOS 103 117 130*  BILITOT 0.2* 0.2* 0.3  BILIDIR -- -- --  IBILI -- -- --  PREALBUMIN -- -- --  TRIG -- -- --  CHOLHDL -- -- --  CHOL -- -- --   Estimated Creatinine Clearance: 46.1 ml/min (by C-G formula based on Cr of 0.54).   No results found for this basename: GLUCAP:3 in the last 72 hours  Medical History: Past Medical History  Diagnosis Date  . CAD (coronary artery disease)   . Hypertension   . Atrial fibrillation   . Hyperlipidemia   . Rectal carcinoma   . Colon cancer 03/13/12 egd    rectal mass5cm  , 2-3cm proximal to anal verge  . Diverticulosis   . Allergy   . Anxiety     rhematoid  . Vomiting     started on  03/23/12  . Obstruction colon 03/24/12    family told obstruction may be outside colon   CBGs & Insulin requirements past 24 hours:   CBGs < 150, no SSI on board  Nutritional Goals:   RD recommendation: pending.  Estimated needs = 60-70 g/protein per day, 1600-1900 KCal/day.   Clinimix E 5/20 @ 60 ml/hr + IVF 20% on MWF will provide 72g protein/day, 1746 Kcal on MWF and 1267 Kcal on other days for an average of 1473 Kcal per day of the week.  Current nutrition:   Diet: NPO  IVF:  NS + 30 mEq/L KCl @ 100 ml/hr  Assessment:   84 yof with rectal mass, s/p sigmoidoscopy 8/19 with pathology showing moderate to poorly differentiated adenocarcinoma, c/w a GYN primary.  Patient developed small bowel obstruction 2/2 neoplasm and without PO intake x 1 week.  MD to start TNA, PICC line in place.  Plan for GYN oncology and surgery consult.   No history of DM - CBGs well controlled.     Renal/hepatic function:   Electrolytes:  K, Na and Cl slightly low.  Corrected Ca wnl  Pre-Albumin: --  TG/Cholesterol: --  Plan:  At 1800 tonight  Start Clinimix E 5/20 @ 40 ml/hr  TNA to contain IV fat emulsion, standard multivitamins and trace elements only on MWF only due to ongoing shortage  Reduce IVF to 60 ml/hr.  Add sensitive SSI q6h  KCl x 4 runs   TNA labs Monday/Thursdays  Dietician consult  Pharmacy will follow up daily  Geoffry Paradise, PharmD, BCPS Pager: [phone] 4:10 PM Pharmacy #: [phone]

## 2012-03-29 ENCOUNTER — Ambulatory Visit: Payer: Medicare Other | Admitting: Oncology

## 2012-03-29 ENCOUNTER — Ambulatory Visit: Payer: Medicare Other

## 2012-03-29 DIAGNOSIS — E46 Unspecified protein-calorie malnutrition: Secondary | ICD-10-CM | POA: Diagnosis not present

## 2012-03-29 DIAGNOSIS — E871 Hypo-osmolality and hyponatremia: Secondary | ICD-10-CM | POA: Diagnosis not present

## 2012-03-29 DIAGNOSIS — C801 Malignant (primary) neoplasm, unspecified: Secondary | ICD-10-CM | POA: Diagnosis not present

## 2012-03-29 DIAGNOSIS — K5669 Other intestinal obstruction: Secondary | ICD-10-CM | POA: Diagnosis not present

## 2012-03-29 LAB — CBC
MCH: 28.6 pg (ref 26.0–34.0)
MCV: 85.1 fL (ref 78.0–100.0)
Platelets: 386 10*3/uL (ref 150–400)
RDW: 14.3 % (ref 11.5–15.5)
WBC: 14 10*3/uL — ABNORMAL HIGH (ref 4.0–10.5)

## 2012-03-29 LAB — DIFFERENTIAL
Basophils Absolute: 0 10*3/uL (ref 0.0–0.1)
Basophils Relative: 0 % (ref 0–1)
Eosinophils Absolute: 0.1 10*3/uL (ref 0.0–0.7)
Eosinophils Relative: 1 % (ref 0–5)

## 2012-03-29 LAB — PHOSPHORUS: Phosphorus: 2.1 mg/dL — ABNORMAL LOW (ref 2.3–4.6)

## 2012-03-29 LAB — COMPREHENSIVE METABOLIC PANEL
AST: 20 U/L (ref 0–37)
CO2: 26 mEq/L (ref 19–32)
Calcium: 8.4 mg/dL (ref 8.4–10.5)
Creatinine, Ser: 0.52 mg/dL (ref 0.50–1.10)
GFR calc Af Amer: >90 mL/min (ref >90)
GFR calc non Af Amer: 87 mL/min — ABNORMAL LOW (ref >90)

## 2012-03-29 LAB — GLUCOSE, CAPILLARY: Glucose-Capillary: 147 mg/dL — ABNORMAL HIGH (ref 70–99)

## 2012-03-29 LAB — HEPARIN LEVEL (UNFRACTIONATED): Heparin Unfractionated: 0.15 IU/mL — ABNORMAL LOW (ref 0.30–0.70)

## 2012-03-29 LAB — MAGNESIUM: Magnesium: 1.5 mg/dL (ref 1.5–2.5)

## 2012-03-29 MED ORDER — HEPARIN (PORCINE) IN NACL 100-0.45 UNIT/ML-% IJ SOLN
1500.0000 [IU]/h | INTRAMUSCULAR | Status: DC
  Administered 2012-03-29 – 2012-03-31 (×4): 1500 [IU]/h via INTRAVENOUS
  Filled 2012-03-29 (×5): qty 250

## 2012-03-29 MED ORDER — ZINC TRACE METAL 1 MG/ML IV SOLN
INTRAVENOUS | Status: AC
  Administered 2012-03-29: 18:00:00 via INTRAVENOUS
  Filled 2012-03-29: qty 2000

## 2012-03-29 MED ORDER — SODIUM CHLORIDE 0.9 % IV SOLN
INTRAVENOUS | Status: DC
  Administered 2012-03-29 – 2012-04-03 (×6): via INTRAVENOUS
  Filled 2012-03-29 (×12): qty 1000

## 2012-03-29 MED ORDER — FAT EMULSION 20 % IV EMUL
240.0000 mL | INTRAVENOUS | Status: AC
  Administered 2012-03-29: 240 mL via INTRAVENOUS
  Filled 2012-03-29: qty 250

## 2012-03-29 MED ORDER — BOOST / RESOURCE BREEZE PO LIQD: 1.0000 | Freq: Three times a day (TID) | ORAL | Status: DC

## 2012-03-29 MED ORDER — BOOST / RESOURCE BREEZE PO LIQD
1.0000 | Freq: Three times a day (TID) | ORAL | Status: DC
  Administered 2012-03-30: 1 via ORAL

## 2012-03-29 NOTE — Progress Notes (Signed)
ANTICOAGULATION CONSULT NOTE - Follow Up Consult  Pharmacy Consult for IV heparin Indication: atrial fibrillation, off coumadin  Allergies  Allergen Reactions  . Pindolol Swelling    Throat swelled up  . Iodine   . Shellfish Allergy   . Vibramycin (Doxycycline Calcium) Nausea Only  . Doxycycline Nausea Only    Patient Measurements: Height: 5\' 1"  (154.9 cm) Weight: 139 lb (63.05 kg) IBW/kg (Calculated) : 47.8  Heparin Dosing Weight:   Vital Signs: Temp: 98.3 F (36.8 C) (09/04 1857) Temp src: Oral (09/04 1619) BP: 157/91 mmHg (09/04 1857) Pulse Rate: 108  (09/04 1857)  Labs:  Basename 03/29/12 1741 03/29/12 0449 03/28/12 0156 03/27/12 0130  HGB -- 11.9* 11.4* --  HCT -- 35.4* 33.4* 34.9*  PLT -- 386 377 393  APTT -- -- -- --  LABPROT -- -- -- --  INR -- -- -- --  HEPARINUNFRC 0.60 0.15* 0.36 --  CREATININE -- 0.52 0.54 0.62  CKTOTAL -- -- -- --  CKMB -- -- -- --  TROPONINI -- -- -- --    Estimated Creatinine Clearance: 46.1 ml/min (by C-G formula based on Cr of 0.52).   Medications:  Scheduled:    . feeding supplement  1 Container Oral TID BM  . insulin aspart  0-9 Units Subcutaneous Q6H  . metoprolol succinate  25 mg Oral Daily  . potassium chloride  10 mEq Intravenous Q1 Hr x 4  . DISCONTD: feeding supplement  1 Container Oral TID BM   Infusions:    . diltiazem (CARDIZEM) infusion 5 mg/hr (03/29/12 0512)  . TPN (CLINIMIX) +/- additives 60 mL/hr at 03/29/12 1806   And  . fat emulsion 240 mL (03/29/12 1805)  . heparin 1,500 Units/hr (03/29/12 1608)  . 0.9 % sodium chloride with kcl 60 mL/hr at 03/29/12 1143  . 0.9 % sodium chloride with kcl 40 mL/hr at 03/29/12 1853  . TPN (CLINIMIX) +/- additives 40 mL/hr at 03/28/12 1905  . DISCONTD: amiodarone (NEXTERONE PREMIX) 360 mg/200 mL dextrose 0.5 mg/min (03/29/12 1143)  . DISCONTD: heparin 1,350 Units/hr (03/28/12 2127)   PRN: acetaminophen, acetaminophen, metoprolol, ondansetron (ZOFRAN) IV, phenol,  promethazine, sodium chloride  Assessment:  78 yo F onCoumadin PTA for h/o afib. Coumadin currently on hold given SBO.  Heparin level this AM subtherapeutic at 0.15 (rate of 1350 units/hour)  Rate increased to 1500 units/hour--HL= 0.60 this evening  Goal of Therapy:  Heparin level 0.3-0.7 units/ml Monitor platelets by anticoagulation protocol: Yes   Plan:   Continue heparin at present rate of 1500 units/hour  Daily heparin level   Loletta Specter 03/29/2012,7:08 PM

## 2012-03-29 NOTE — Progress Notes (Signed)
PARENTERAL NUTRITION CONSULT NOTE - INITIAL  Pharmacy Consult for TNA  Indication: SBO 2/2 neoplasm  Allergies  Allergen Reactions  . Pindolol Swelling    Throat swelled up  . Iodine   . Shellfish Allergy   . Vibramycin (Doxycycline Calcium) Nausea Only  . Doxycycline Nausea Only   Patient Measurements: Height: 5\' 1"  (154.9 cm) Weight: 139 lb (63.05 kg) IBW/kg (Calculated) : 47.8   Vital Signs: Temp: 98.5 F (36.9 C) (09/04 0455) Temp src: Oral (09/04 0455) BP: 125/80 mmHg (09/04 0455) Pulse Rate: 100  (09/04 0455) Intake/Output from previous day: 09/03 0701 - 09/04 0700 In: 1641.3 [I.V.:1441.3; IV Piggyback:200] Out: 1275 [Urine:1075; Emesis/NG output:200] Intake/Output from this shift:    Labs:  [facility] 03/29/12 0449 03/28/12 0156 03/27/12 0130 03/26/12 1554  WBC 14.0* 14.3* 12.9* --  HGB 11.9* 11.4* 12.3 --  HCT 35.4* 33.4* 34.9* --  PLT 386 377 393 --  APTT -- -- -- --  INR -- -- -- 1.25     Basename 03/29/12 0449 03/28/12 0156 03/27/12 0130  NA 129* 128* 125*  K 3.7 3.4* 3.4*  CL 95* 95* 91*  CO2 26 21 23   GLUCOSE 151* 86 99  BUN 5* 6 9  CREATININE 0.52 0.54 0.62  LABCREA -- -- --  CREAT24HRUR -- -- --  CALCIUM 8.4 7.9* 8.3*  MG 1.5 1.5 --  PHOS 2.1* -- --  PROT 5.3* 4.9* 5.4*  ALBUMIN 2.2* 2.2* 2.4*  AST 20 20 27   ALT 9 10 14   ALKPHOS 101 103 117  BILITOT 0.2* 0.2* 0.2*  BILIDIR -- -- --  IBILI -- -- --  PREALBUMIN -- -- --  TRIG -- -- --  CHOLHDL -- -- --  CHOL -- -- --   Estimated Creatinine Clearance: 46.1 ml/min (by C-G formula based on Cr of 0.52).    Basename 03/29/12 0602 03/29/12 0026 03/28/12 1748  GLUCAP 157* 144* 76   CBGs & Insulin requirements past 24 hours:   CBGs 76, 144, 157 on sensitive SSI Q6h  Nutritional Goals:   RD recommendation: pending.  Estimated needs = 60-70 g/protein per day, 1600-1900 KCal/day.   Clinimix E 5/20 @ 60 ml/hr + IVF 20% on MWF will provide 72g protein/day, 1746 Kcal on MWF and 1267  Kcal on other days for an average of 1473 Kcal per day of the week.  Current nutrition:   Diet: NPO  IVF:  NS + 30 mEq/L KCl @ 60 ml/hr  Assessment:   84 yof with rectal mass, s/p sigmoidoscopy 8/19 with pathology showing moderate to poorly differentiated adenocarcinoma, c/w a GYN primary.  Patient developed small bowel obstruction 2/2 neoplasm and without PO intake x 1 week.  TNA started 9/3. Plan for GYN oncology and surgery consult.   No history of DM - CBGs ok since starting TNA    Plan per MD is proceed with placement of gastrostomy tube and advance diet as tolerated once gastrostomy tube is in place.     Renal/hepatic function: wnl   Electrolytes:  Na and Cl slightly low (unable to correct in TNA).  Corrected Ca wnl  Pre-Albumin: pending  TG/Cholesterol: pending  Plan:    Increase Clinimix E 5/20 to 60 ml/hr, follow up RD recommendations.  TNA to contain IV fat emulsion, standard multivitamins and trace elements only on MWF only due to ongoing shortage  Reduce IVF to 40 ml/hr.  Continue sensitive SSI q6h  TNA labs Monday/Thursdays  Pharmacy will follow up daily  Geoffry Paradise, PharmD, BCPS Pager: [phone] 9:21 AM Pharmacy #: [phone]

## 2012-03-29 NOTE — Progress Notes (Signed)
QTc appeared prolonged on telemetry strip at .550.  Performed an EKG and QTc was .512.  Paged the PA on call, Franky Macho, and informed him of such.  He did not give any further instructions regarding the patient.  Will continue to monitor.

## 2012-03-29 NOTE — Progress Notes (Signed)
INITIAL ADULT NUTRITION ASSESSMENT Date: 03/29/2012   Time: 8:50 AM Reason for Assessment: New tpn/consult  ASSESSMENT: Female 77 y.o.  Dx: Small bowel obstruction secondary to neoplasm (probable ovarian cancer), HD #6, newly dx rectal cancer  Hx:  Past Medical History  Diagnosis Date  . CAD (coronary artery disease)   . Hypertension   . Atrial fibrillation   . Hyperlipidemia   . Rectal carcinoma   . Colon cancer 03/13/12 egd    rectal mass5cm  , 2-3cm proximal to anal verge  . Diverticulosis   . Allergy   . Anxiety     rhematoid  . Vomiting     started on 03/23/12  . Obstruction colon 03/24/12    family told obstruction may be outside colon  . Pelvic mass in female, necrotic 03/28/2012   Past Surgical History  Procedure Date  . Pace maker 2010  . Rib removed 1934    Pleurisy and pneumonia  . Partial hysterectomy 1972  . Bladder neck suspension 1986  . Heart catherization [id number]  . Abdominal hysterectomy     both ovaries intact,     Related Meds:     . insulin aspart  0-9 Units Subcutaneous Q6H  . metoprolol succinate  25 mg Oral Daily  . potassium chloride  10 mEq Intravenous Q1 Hr x 4     Ht: 5\' 1"  (154.9 cm)  Wt: 139 lb (63.05 kg)  Ideal Wt: 47.8 kg  % Ideal Wt: 132  Usual Wt: 132# Wt Readings from Last 10 Encounters:  03/24/12 139 lb (63.05 kg)  03/24/12 139 lb 9.6 oz (63.322 kg)  03/20/12 133 lb 13.1 oz (60.7 kg)  03/15/12 134 lb 4.8 oz (60.918 kg)  03/13/12 132 lb (59.875 kg)  03/09/12 132 lb 6.4 oz (60.056 kg)  03/08/12 132 lb (59.875 kg)    % Usual Wt: 105- increased with fluid  Body mass index is 26.26 kg/(m^2).    Labs:  CMP     Component Value Date/Time   NA 129* 03/29/2012 0449   K 3.7 03/29/2012 0449   CL 95* 03/29/2012 0449   CO2 26 03/29/2012 0449   GLUCOSE 151* 03/29/2012 0449   BUN 5* 03/29/2012 0449   CREATININE 0.52 03/29/2012 0449   CALCIUM 8.4 03/29/2012 0449   PROT 5.3* 03/29/2012 0449   ALBUMIN 2.2* 03/29/2012 0449   AST 20  03/29/2012 0449   ALT 9 03/29/2012 0449   ALKPHOS 101 03/29/2012 0449   BILITOT 0.2* 03/29/2012 0449   GFRNONAA 87* 03/29/2012 0449   GFRAA >90 03/29/2012 0449    I/O last 3 completed shifts: In: 3874.6 [I.V.:3674.6; IV Piggyback:200] Out: 2275 [UJWJX:9147; Emesis/NG output:600]     Diet Order: Clear Liquids  Supplements/Tube Feeding:  none  IVF:    amiodarone (NEXTERONE PREMIX) 360 mg/200 mL dextrose Last Rate: 0.501 mg/min (03/29/12 0800)  diltiazem (CARDIZEM) infusion Last Rate: 5 mg/hr (03/29/12 0512)  heparin Last Rate: 1,350 Units/hr (03/28/12 2127)  0.9 % sodium chloride with kcl Last Rate: 100 mL/hr at 03/28/12 0947  0.9 % sodium chloride with kcl Last Rate: 60 mL/hr at 03/28/12 1914  TPN (CLINIMIX) +/- additives Last Rate: 40 mL/hr at 03/28/12 1905  DISCONTD: ADULT TPN     Estimated Nutritional Needs:   Kcal: 1650-1850 Protein: 65-75 gm Fluid: >1.7L  Food/Nutrition Related Hx: Ate well prior to admit.  Prefers a lot of fruits and veges, not a big meat eater.  Will eat chicken, fish and [place].  NG to be removed.  Diet advanced to Clear Liquids.  Bowel Movements today.   NUTRITION DIAGNOSIS: -Inadequate oral intake (NI-2.1).  Status: Ongoing  RELATED TO: altered gi status  AS EVIDENCE BY: clear liquid diet.  MONITORING/EVALUATION(Goals): Monitor:  Tpn, diet advancement and intake, weight, labs Goal:  Meet 100% estimated needs with tpn + lipids  EDUCATION NEEDS: -No education needs identified at this time  INTERVENTION: Discussed protein sources and need to increase protein in diet. Resource Breeze tid  Dietitian #:[phone]  DOCUMENTATION CODES Per approved criteria  -Not Applicable    Jeoffrey Massed 03/29/2012, 8:50 AM

## 2012-03-29 NOTE — Progress Notes (Signed)
Restarted the amiodarone drip per dr. Herbie [place] the qtc is now 49, will do another ekg at 1600 today, will coninue to monitor and observe pt.

## 2012-03-29 NOTE — Progress Notes (Signed)
Patient ID: Victoria [name], female   DOB: 1926-10-21, 78 y.o.   MRN: [id number]  General Surgery - [facility] - Progress Note  Subjective: Patient pleasant - had several small BM's and passed flatus.  Family at bedside with long discussion.  Patient wants NG tube out and wants to try liquids.  Objective: Vital signs in last 24 hours: Temp:  [98.1 F (36.7 C)-98.5 F (36.9 C)] 98.1 F (36.7 C) (09/04 1008) Pulse Rate:  [70-101] 73  (09/04 1008) Resp:  [18] 18  (09/04 1008) BP: (112-128)/(70-84) 112/70 mmHg (09/04 1008) SpO2:  [95 %-96 %] 95 % (09/04 1008) Last BM Date: 03/28/12  Intake/Output from previous day: 09/03 0701 - 09/04 0700 In: 1641.3 [I.V.:1441.3; IV Piggyback:200] Out: 1275 [Urine:1075; Emesis/NG output:200]  Exam: HEENT - clear, not icteric Neck - soft Chest - clear bilaterally Cor - RRR, no murmur Abd - mild distension; BS present; non-tender Ext - no significant edema Neuro - grossly intact, no focal deficits  Lab Results:   Basename 03/29/12 0449 03/28/12 0156  WBC 14.0* 14.3*  HGB 11.9* 11.4*  HCT 35.4* 33.4*  PLT 386 377     Basename 03/29/12 0449 03/28/12 0156  NA 129* 128*  K 3.7 3.4*  CL 95* 95*  CO2 26 21  GLUCOSE 151* 86  BUN 5* 6  CREATININE 0.52 0.54  CALCIUM 8.4 7.9*    Studies/Results: Dg Chest Port 1 View  03/27/2012  *RADIOLOGY REPORT*  Clinical Data: Bedside PICC placement.  PORTABLE CHEST - 1 VIEW 03/27/2012 1504 hours:  Comparison: Two-view chest x-ray 03/20/2012.  Findings: Right arm PICC tip projects over the mid SVC. Nasogastric tube looped in the stomach with its tip in the mid body.  Cardiac silhouette enlarged but stable, allowing for differences in technique.  Interval development of atelectasis in both lung bases.  Stable scarring in the right upper lobe.  Lungs otherwise clear.  Left subclavian dual lead transvenous pacemaker unchanged and appears intact.  IMPRESSION:  1.  Right arm PICC tip projects  over the mid SVC. 2.  New bibasilar atelectasis.  No acute cardiopulmonary disease otherwise. 3.  Stable cardiomegaly without pulmonary edema. 4.  Nasogastric tube looped in the stomach with its tip in the mid body.   Original Report Authenticated By: Arnell Sieving, M.D.     Assessment / Plan: 1.  Metastatic tumor with partial SBO  - TNA started per oncology - agree  - will remove NG tube today and give trial of clear liquids  - agree with plans for chemo Rx this admission  - will try to avoid laparotomy pending chemo Rx results; if obstructive symptoms persist, will ask Dr. Marina Goodell from GI to place PEG tube for decompression and nutritional support  - will follow closely with you  - appreciate Dr. Truett Perna and Dr. Forrestine Him care for this pleasant patient  Velora Heckler, MD, [facility] Office: [phone]  03/29/2012

## 2012-03-29 NOTE — Progress Notes (Signed)
[facility]  Subjective: Asymptomatic,    Objective: Vital signs in last 24 hours: Temp:  [97.7 F (36.5 C)-98.5 F (36.9 C)] 97.7 F (36.5 C) (09/04 1619) Pulse Rate:  [73-101] 90  (09/04 1619) Resp:  [18] 18  (09/04 1619) BP: (112-125)/(70-86) 118/86 mmHg (09/04 1619) SpO2:  [95 %-96 %] 95 % (09/04 1619) Last BM Date: 03/28/12  Intake/Output from previous day: 09/03 0701 - 09/04 0700 In: 1641.3 [I.V.:1441.3; IV Piggyback:200] Out: 1275 [Urine:1075; Emesis/NG output:200] Intake/Output this shift: Total I/O In: 0  Out: 250 [Emesis/NG output:250]  Medications Current Facility-Administered Medications  Medication Dose Route Frequency Provider Last Rate Last Dose  . acetaminophen (TYLENOL) tablet 650 mg  650 mg Oral Q6H PRN Rana Snare, NP   650 mg at 03/25/12 2015   Or  . acetaminophen (TYLENOL) suppository 650 mg  650 mg Rectal Q6H PRN Rana Snare, NP      . amiodarone (NEXTERONE PREMIX) 360 mg/200 mL dextrose IV infusion  0.5 mg/min Intravenous Continuous Mihai Croitoru, MD 16.7 mL/hr at 03/29/12 1143 0.5 mg/min at 03/29/12 1143  . diltiazem (CARDIZEM) 100 mg in dextrose 5 % 100 mL infusion  5-15 mg/hr Intravenous Titrated Wilburt Finlay, PA 5 mL/hr at 03/29/12 0512 5 mg/hr at 03/29/12 0512  . TPN (CLINIMIX) +/- additives   Intravenous Continuous TPN Thuyvan Stacey Drain, PHARMD       And  . fat emulsion 20 % infusion 240 mL  240 mL Intravenous Continuous TPN Thuyvan Thi Phan, PHARMD      . feeding supplement (RESOURCE BREEZE) liquid 1 Container  1 Container Oral TID BM Jeoffrey Massed, RD      . heparin ADULT infusion 100 units/mL (25000 units/250 mL)  1,500 Units/hr Intravenous Continuous Theda Sers, PHARMD 15 mL/hr at 03/29/12 1608 1,500 Units/hr at 03/29/12 1608  . insulin aspart (novoLOG) injection 0-9 Units  0-9 Units Subcutaneous Q6H Theda Sers, PHARMD   1 Units at 03/29/12 1237  . metoprolol (LOPRESSOR) injection 5 mg  5 mg  Intravenous Q4H PRN Levert Feinstein, MD   5 mg at 03/26/12 0600  . metoprolol succinate (TOPROL-XL) 24 hr tablet 25 mg  25 mg Oral Daily Rana Snare, NP   25 mg at 03/29/12 1041  . ondansetron (ZOFRAN) injection 4 mg  4 mg Intravenous Q6H PRN Rana Snare, NP      . phenol (CHLORASEPTIC) mouth spray 1 spray  1 spray Mouth/Throat PRN Velora Heckler, MD      . potassium chloride 10 mEq in 50 mL *CENTRAL LINE* IVPB  10 mEq Intravenous Q1 Hr x 4 Thuyvan Thi Phan, PHARMD   10 mEq at 03/28/12 2220  . promethazine (PHENERGAN) injection 12.5 mg  12.5 mg Intravenous Q4H PRN Velora Heckler, MD   12.5 mg at 03/24/12 2244  . sodium chloride 0.9 % 1,000 mL with potassium chloride 30 mEq infusion   Intravenous Continuous Theda Sers, PHARMD 100 mL/hr at 03/28/12 0947    . sodium chloride 0.9 % 1,000 mL with potassium chloride 30 mEq infusion   Intravenous Continuous Thuyvan Stacey Drain, PHARMD 60 mL/hr at 03/29/12 1143    . sodium chloride 0.9 % 1,000 mL with potassium chloride 30 mEq infusion   Intravenous Continuous Thuyvan Thi Phan, PHARMD      . sodium chloride 0.9 % injection 10-40 mL  10-40 mL Intracatheter PRN Samul Dada, MD   10 mL at 03/28/12  1610  . TPN [facility]) +/- additives   Intravenous Continuous TPN Theda Sers, PHARMD 40 mL/hr at 03/28/12 1905    . DISCONTD: feeding supplement (RESOURCE BREEZE) liquid 1 Container  1 Container Oral TID BM Jeoffrey Massed, RD      . DISCONTD: heparin ADULT infusion 100 units/mL (25000 units/250 mL)  1,350 Units/hr Intravenous Continuous Lorra Hals [place], PHARMD 13.5 mL/hr at 03/28/12 2127 1,350 Units/hr at 03/28/12 2127    PE: General appearance: alert, cooperative and no distress Lungs: rales on the right Heart: irregularly irregular rhythm Extremities: No lee Pulses: 2+ and symmetric Neurologic: Grossly normal  Lab Results:   Basename 03/29/12 0449 03/28/12 0156 03/27/12 0130  WBC 14.0* 14.3* 12.9*  HGB 11.9* 11.4* 12.3  HCT  35.4* 33.4* 34.9*  PLT 386 377 393   BMET  Basename 03/29/12 0449 03/28/12 0156 03/27/12 0130  NA 129* 128* 125*  K 3.7 3.4* 3.4*  CL 95* 95* 91*  CO2 26 21 23   GLUCOSE 151* 86 99  BUN 5* 6 9  CREATININE 0.52 0.54 0.62  CALCIUM 8.4 7.9* 8.3*   PT/INR No results found for this basename: LABPROT:3,INR:3 in the last 72 hours Cholesterol  Basename 03/29/12 0449  CHOL 100   Lipid Panel     Component Value Date/Time   CHOL 100 03/29/2012 0449   TRIG 97 03/29/2012 0449   HDL 43 05/15/2008 0620   CHOLHDL 3.3 05/15/2008 0620   VLDL 17 05/15/2008 0620   LDLCALC  Value: 82        Total Cholesterol/HDL:CHD Risk Coronary Heart Disease Risk Table                     Men   Women  1/2 Average Risk   3.4   3.3 05/15/2008 0620     Assessment/Plan  Principal Problem:  *Small bowel obstruction Active Problems:  Rectal carcinoma  Atrial fibrillation  Chronic anticoagulation  CAD (coronary artery disease)  S/P placement of cardiac pacemaker  Hyponatremia  Pelvic mass in female, necrotic  Plan:  NG tube removed.  Rate controlled at ~100 on tele.  Range 73-100.  EKG -likely Aflutter and intertwined pacing at fixed, regular intervals, before T wave.  ? Related to long QTc. IV amio and dilt.  Minimal improvement in Na+.  BP stable.  Pt to start chemo.  QTc increased to 570 from 464.  Holding Amio.   LOS: 5 days    HAGER, BRYAN 03/29/2012 4:48 PM   Agree with note written by Jones Skene PAC  Pt of Dr. Jillyn Hidden s/p remote PTVPM insertion by Dr. Lynnea Ferrier. PAR. On coumadin A./C prior to admission. SBO. Pelvic mass. Now on IV hep. Apparently had attempt at overdfrive pacing earlier this week. Now AF with CVR. Intermittent pacing with what appears to be failure to sense. Will need [employer] Rep to re evaluate. Apparently there is a question of DCCV on Friday. Will need to discuss with DR. Gwendolyn Lima this new Dx of CA, I wonder whether long term coumadin A/C is still a good idea but will defer to Dr.  Zachary George 03/29/2012 6:29 PM

## 2012-03-29 NOTE — Progress Notes (Signed)
ANTICOAGULATION CONSULT NOTE - Initial Consult  Pharmacy Consult for IV heparin Indication: afib, off coumadin  Allergies  Allergen Reactions  . Pindolol Swelling    Throat swelled up  . Iodine   . Shellfish Allergy   . Vibramycin (Doxycycline Calcium) Nausea Only  . Doxycycline Nausea Only    Patient Measurements: Height: 5\' 1"  (154.9 cm) Weight: 139 lb (63.05 kg) IBW/kg (Calculated) : 47.8   Vital Signs: Temp: 98.5 F (36.9 C) (09/04 0455) Temp src: Oral (09/04 0455) BP: 125/80 mmHg (09/04 0455) Pulse Rate: 100  (09/04 0455)  Labs:  Basename 03/29/12 0449 03/28/12 0156 03/27/12 1924 03/27/12 0130 03/26/12 1554  HGB 11.9* 11.4* -- -- --  HCT 35.4* 33.4* -- 34.9* --  PLT 386 377 -- 393 --  APTT -- -- -- -- --  LABPROT -- -- -- -- 16.0*  INR -- -- -- -- 1.25  HEPARINUNFRC 0.15* 0.36 0.45 -- --  CREATININE 0.52 0.54 -- 0.62 --  CKTOTAL -- -- -- -- --  CKMB -- -- -- -- --  TROPONINI -- -- -- -- --    Estimated Creatinine Clearance: 46.1 ml/min (by C-G formula based on Cr of 0.52).   Medical History: Past Medical History  Diagnosis Date  . CAD (coronary artery disease)   . Hypertension   . Atrial fibrillation   . Hyperlipidemia   . Rectal carcinoma   . Colon cancer 03/13/12 egd    rectal mass5cm  , 2-3cm proximal to anal verge  . Diverticulosis   . Allergy   . Anxiety     rhematoid  . Vomiting     started on 03/23/12  . Obstruction colon 03/24/12    family told obstruction may be outside colon  . Pelvic mass in female, necrotic 03/28/2012   Assessment:  84 yof on Coumadin PTA for h/o afib.  Coumadin currently on hold given SBO.  IV heparin started 9/2 and level was therapeutic on 1350 units/hr.  Last INR 1.25 on 9/1.   Heparin level this AM subtherapeutic at 0.15, no issues per RN.  No bleeding/complications noted, H/H, plts wnl.   MD considering potential for ~6wks of therapeutic Lovenox as patient will not likely be taking warfarin prior to planned OR  time.  Could still consider TEE guided DCCV by if remains in a flutter after amio per cards.   Goal of Therapy:  INR 2-3 Monitor platelets by anticoagulation protocol: Yes   Plan:   No bolus, increase IV heparin to 1500 units/hr. Recheck heparin level at 1800 tonight.   Daily heparin level  Pharmacy will f/u plans  Geoffry Paradise Thi 03/29/2012,9:08 AM

## 2012-03-29 NOTE — Progress Notes (Signed)
IP PROGRESS NOTE  Subjective:  She reports several small bowel movements this morning. No pain.   Objective: Vital signs in last 24 hours: Blood pressure 125/80, pulse 100, temperature 98.5 F (36.9 C), temperature source Oral, resp. rate 18, height 5\' 1"  (1.549 m), weight 139 lb (63.05 kg), SpO2 96.00%.  Intake/Output from previous day: 09/03 0701 - 09/04 0700 In: 1641.3 [I.V.:1441.3; IV Piggyback:200] Out: 1275 [Urine:1075; Emesis/NG output:200]  Physical Exam: Lungs: Decreased breath sounds at the right greater than left lower chest, no respiratory distress, inspiratory rhonchi at the left base Cardiac: Irregular Abdomen: Soft and nontender Extremities: No leg edema     Lab Results:  Basename 03/29/12 0449 03/28/12 0156  WBC 14.0* 14.3*  HGB 11.9* 11.4*  HCT 35.4* 33.4*  PLT 386 377    BMET  Basename 03/29/12 0449 03/28/12 0156  NA 129* 128*  K 3.7 3.4*  CL 95* 95*  CO2 26 21  GLUCOSE 151* 86  BUN 5* 6  CREATININE 0.52 0.54  CALCIUM 8.4 7.9*   Heparin level less than 0. 36 Studies/Results: Dg Chest Port 1 View  03/27/2012  *RADIOLOGY REPORT*  Clinical Data: Bedside PICC placement.  PORTABLE CHEST - 1 VIEW 03/27/2012 1504 hours:  Comparison: Two-view chest x-ray 03/20/2012.  Findings: Right arm PICC tip projects over the mid SVC. Nasogastric tube looped in the stomach with its tip in the mid body.  Cardiac silhouette enlarged but stable, allowing for differences in technique.  Interval development of atelectasis in both lung bases.  Stable scarring in the right upper lobe.  Lungs otherwise clear.  Left subclavian dual lead transvenous pacemaker unchanged and appears intact.  IMPRESSION:  1.  Right arm PICC tip projects over the mid SVC. 2.  New bibasilar atelectasis.  No acute cardiopulmonary disease otherwise. 3.  Stable cardiomegaly without pulmonary edema. 4.  Nasogastric tube looped in the stomach with its tip in the mid body.   Original Report Authenticated By:  Arnell Sieving, M.D.    Dg Abd 2 Views  03/27/2012  *RADIOLOGY REPORT*  Clinical Data: Small bowel obstruction  ABDOMEN - 2 VIEW  Comparison:   the previous day's study  Findings: Nasogastric tube loops in the decompressed stomach. Transvenous pacing leads are partially seen.  There are a few gas dilated mid abdominal small bowel loops, with fluid levels on the erect radiograph, stable in number and size since previous exam. No free air.  Normal distribution of gas and stool throughout the colon.  Residual oral contrast opacifies multiple descending and sigmoid diverticula.  Multilevel degenerative changes in the lumbar spine.  IMPRESSION: 1.  Persistent small bowel obstruction, with nasogastric tube in place. 2.  No free air.   Original Report Authenticated By: Osa Craver, M.D.     Medications: I have reviewed the patient's current medications.  Assessment/Plan:  1. 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 A staging CT scan 03/02/2012 confirmed a mass at the right aspect of the rectum with nodal masses in the abdomen/pelvis. Elevated CA 125.  2. Small bowel obstruction secondary to #1-NG tube remains in place, she is passing flatus and had several small bowel movements this morning  3. atrial fibrillation/flutter-medical therapy per cardiology  4. Anticoagulation-Coumadin on hold, she received low-dose vitamin K in the p.m. on 03/24/2012. Now on IV heparin, dosing per pharmacy, the heparin level is therapeutic this morning  5. Hypokalemia-the potassium is now in the normal range  6. Nutrition-TNA started on 03/28/2012  I discussed the case with GYN oncology and Dr. Gerrit Friends. The plan is to proceed with placement of a gastrostomy tube. Her diet will be advanced as tolerated once the gastrostomy tube is in place. We will initiate a  first cycle of Taxol/carboplatin chemotherapy while she is in the hospital. Hopefully she can be transitioned to an oral medical regimen for the atrial fibrillation/flutter and then transferred to the inpatient oncology unit for chemotherapy.  I updated her son at the bedside today.    LOS: 5 days   Dugan Vanhoesen, Jillyn Hidden  03/29/2012, 8:43 AM

## 2012-03-29 NOTE — Progress Notes (Signed)
Pt's tele strip at 0002 showed a QTC of , PA on call was paged. Nada Boozer, PA, ordered to have a 12 lead EKG to determine a more accurate QTC. The QTC was . The PA instructed the nurse to hold the amiodarone infusion for now and get a 2nd 12 lead EKG at 0700. The pt is asymptomatic and resting well. RN will continue to monitor the heart rate and rhythm closely.

## 2012-03-29 NOTE — Progress Notes (Signed)
DID EKG PER MD. ORDERS SHOWED EKG TO BRIAN HAGER, PA, NEW ORDERS TO D/C AMIODARONE DRIP AT THIS TIME, DRIP HAS BEEN STOPPED WILL CONTINUE TO MONITOR AND OBSERVE PT.

## 2012-03-30 LAB — CBC
HCT: 35.7 % — ABNORMAL LOW (ref 36.0–46.0)
Hemoglobin: 12.2 g/dL (ref 12.0–15.0)
MCV: 85.6 fL (ref 78.0–100.0)
WBC: 11.9 10*3/uL — ABNORMAL HIGH (ref 4.0–10.5)

## 2012-03-30 LAB — COMPREHENSIVE METABOLIC PANEL
AST: 21 U/L (ref 0–37)
Albumin: 2.2 g/dL — ABNORMAL LOW (ref 3.5–5.2)
Alkaline Phosphatase: 99 U/L (ref 39–117)
BUN: 7 mg/dL (ref 6–23)
CO2: 28 mEq/L (ref 19–32)
Chloride: 97 mEq/L (ref 96–112)
Creatinine, Ser: 0.6 mg/dL (ref 0.50–1.10)
GFR calc Af Amer: >90 mL/min (ref >90)
GFR calc non Af Amer: 83 mL/min — ABNORMAL LOW (ref >90)
Potassium: 3.9 mEq/L (ref 3.5–5.1)
Total Bilirubin: 0.2 mg/dL — ABNORMAL LOW (ref 0.3–1.2)

## 2012-03-30 LAB — PHOSPHORUS: Phosphorus: 2.7 mg/dL (ref 2.3–4.6)

## 2012-03-30 LAB — GLUCOSE, CAPILLARY
Glucose-Capillary: 149 mg/dL — ABNORMAL HIGH (ref 70–99)
Glucose-Capillary: 142 mg/dL — ABNORMAL HIGH (ref 70–99)

## 2012-03-30 LAB — MAGNESIUM: Magnesium: 1.6 mg/dL (ref 1.5–2.5)

## 2012-03-30 MED ORDER — DEXAMETHASONE 6 MG PO TABS
10.0000 mg | ORAL_TABLET | Freq: Once | ORAL | Status: AC
  Administered 2012-03-30: 10 mg via ORAL
  Filled 2012-03-30: qty 1

## 2012-03-30 MED ORDER — DEXAMETHASONE 6 MG PO TABS
10.0000 mg | ORAL_TABLET | Freq: Once | ORAL | Status: AC
  Administered 2012-03-31: 10 mg via ORAL
  Filled 2012-03-30: qty 1

## 2012-03-30 MED ORDER — DILTIAZEM HCL 60 MG PO TABS
60.0000 mg | ORAL_TABLET | Freq: Three times a day (TID) | ORAL | Status: DC
  Administered 2012-03-30 – 2012-03-31 (×3): 60 mg via ORAL
  Filled 2012-03-30 (×5): qty 1

## 2012-03-30 MED ORDER — CLINIMIX E/DEXTROSE (5/20) 5 % IV SOLN
INTRAVENOUS | Status: AC
  Administered 2012-03-30: 19:00:00 via INTRAVENOUS
  Filled 2012-03-30: qty 2000

## 2012-03-30 MED ORDER — ALTEPLASE 2 MG IJ SOLR
2.0000 mg | Freq: Once | INTRAMUSCULAR | Status: AC
  Administered 2012-03-30: 2 mg
  Filled 2012-03-30: qty 2

## 2012-03-30 NOTE — Progress Notes (Signed)
Utilization review completed.  

## 2012-03-30 NOTE — Progress Notes (Signed)
CARE MANAGEMENT NOTE 03/30/2012  Patient:  Victoria [name], Victoria [name]   Account Number:  [id number]  Date Initiated:  03/30/2012  Documentation initiated by:  Colleen Can  Subjective/Objective Assessment:   dx SBO, rectal mass, atrial fib/has pacemaker.  Pt is from home with spouse     Action/Plan:   Medically complex ; d/c plans are uncertain at this time at this time/CM will follow for needs   Anticipated DC Date:  04/02/2012   Anticipated DC Plan:  home with hh services          Status of service:  In process, will continue to follow  If discussed at Long Length of Stay Meetings, dates discussed:   03/30/2012

## 2012-03-30 NOTE — Progress Notes (Signed)
PARENTERAL NUTRITION CONSULT NOTE - Follow Up  Pharmacy Consult for TNA  Indication: SBO 2/2 neoplasm  Allergies  Allergen Reactions  . Pindolol Swelling    Throat swelled up  . Iodine   . Shellfish Allergy   . Vibramycin (Doxycycline Calcium) Nausea Only  . Doxycycline Nausea Only   Patient Measurements: Height: 5\' 1"  (154.9 cm) Weight: 139 lb (63.05 kg) IBW/kg (Calculated) : 47.8   Vital Signs: Temp: 97.5 F (36.4 C) (09/05 0533) Temp src: Oral (09/05 0533) BP: 110/70 mmHg (09/05 0533) Pulse Rate: 68  (09/05 0533) Intake/Output from previous day: 09/04 0701 - 09/05 0700 In: 2098.8 [I.V.:1189.7; TPN:909.2] Out: 1450 [Urine:900; Emesis/NG output:550] Intake/Output from this shift: Total I/O In: 240 [P.O.:240] Out: 300 [Urine:300]  Labs:  [facility] 03/29/12 0449 03/28/12 0156  WBC 14.0* 14.3*  HGB 11.9* 11.4*  HCT 35.4* 33.4*  PLT 386 377  APTT -- --  INR -- --     Basename 03/30/12 0432 03/29/12 0449 03/28/12 0156  NA 131* 129* 128*  K 3.9 3.7 3.4*  CL 97 95* 95*  CO2 28 26 21   GLUCOSE 107* 151* 86  BUN 7 5* 6  CREATININE 0.60 0.52 0.54  LABCREA -- -- --  CREAT24HRUR -- -- --  CALCIUM 8.5 8.4 7.9*  MG 1.6 1.5 1.5  PHOS 2.7 2.1* --  PROT 5.1* 5.3* 4.9*  ALBUMIN 2.2* 2.2* 2.2*  AST 21 20 20   ALT 10 9 10   ALKPHOS 99 101 103  BILITOT 0.2* 0.2* 0.2*  BILIDIR -- -- --  IBILI -- -- --  PREALBUMIN -- 6.5* --  TRIG -- 97 --  CHOLHDL -- -- --  CHOL -- 100 --   Estimated Creatinine Clearance: 46.1 ml/min (by C-G formula based on Cr of 0.6).    Basename 03/30/12 0633 03/30/12 0032 03/29/12 1846  GLUCAP 149* 141* 135*   CBGs & Insulin requirements past 24 hours:   CBGs <150 on sensitive SSI Q6h  Nutritional Goals:   9/4 RD recs: Kcal 1650-1850, Protein: 65-75g, Fluid > 1.7L  Clinimix E 5/20 @ 60 ml/hr + IVF 20% on MWF will provide 72g protein/day, 1746 Kcal on MWF and 1267 Kcal on other days for an average of 1473 Kcal per day of the  week.  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  Patient developed small bowel obstruction 2/2 neoplasm and without PO intake x 1 week.  TNA started 9/3. Plan for GYN oncology and surgery consult.   No history of DM - CBGs ok since starting TNA    Per [facility] note, pt is consuming CLD without nausea or emesis. 2 BMs this am.   Plan per [facility] is to continue CLD, no immediate surgical intervention planned, gastrostomy tube placement only if S/Sx of obstruction recur.     Renal/hepatic function: wnl   Electrolytes:  Na slightly low (unable to correct in TNA).  Corrected Ca wnl  Pre-Albumin: 6.5  TG/Cholesterol: wnl  Plan:    Continue Clinimix E 5/20 at 60 ml/hr  TNA to contain IV fat emulsion, standard multivitamins and trace elements only on MWF only due to ongoing shortage  Continue sensitive SSI q6h  TNA labs Monday/Thursdays  Pharmacy will follow up daily  Hopefully if tolerates diet  advancement can wean and D/C TNA soon.  Darrol Angel, PharmD Pager: [phone] 03/30/2012 10:17 AM

## 2012-03-30 NOTE — Progress Notes (Signed)
IP PROGRESS NOTE  Subjective:  She had another bowel movement this morning. She is tolerating a liquid diet without nausea. No pain. No complaint.   Objective: Vital signs in last 24 hours: Blood pressure 110/70, pulse 68, temperature 97.5 F (36.4 C), temperature source Oral, resp. rate 20, height 5\' 1"  (1.549 m), weight 139 lb (63.05 kg), SpO2 96.00%.  Intake/Output from previous day: 09/04 0701 - 09/05 0700 In: 2098.8 [I.V.:1189.7; TPN:909.2] Out: 1450 [Urine:900; Emesis/NG output:550]  Physical Exam: Lungs: Decreased breath sounds at the right greater than left lower chest, no respiratory distress, inspiratory rhonchi at the left base Cardiac: Irregular, tachycardia Abdomen: Soft and nontender, bowel sounds are present Extremities: No leg edema     Lab Results:  [facility] 03/30/12 1110 03/29/12 0449  WBC 11.9* 14.0*  HGB 12.2 11.9*  HCT 35.7* 35.4*  PLT 363 386    BMET  Basename 03/30/12 0432 03/29/12 0449  NA 131* 129*  K 3.9 3.7  CL 97 95*  CO2 28 26  GLUCOSE 107* 151*  BUN 7 5*  CREATININE 0.60 0.52  CALCIUM 8.5 8.4   Heparin level 0.52  Medications: I have reviewed the patient's current medications.  Assessment/Plan:  1. 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 A staging CT scan 03/02/2012 confirmed a mass at the right aspect of the rectum with nodal masses in the abdomen/pelvis. Elevated CA 125. Normal CEA  2. Small bowel obstruction secondary to #1-the NG tube was removed on 03/29/2012. She had a bowel movement today and is tolerating liquids  3. atrial fibrillation/flutter-medical therapy per cardiology  4. Anticoagulation-Coumadin on hold, she received low-dose vitamin K in the p.m. on 03/24/2012. Now on IV heparin, dosing per pharmacy, the heparin level is therapeutic this morning. It will be  difficult to regulate Coumadin in her case secondary to variable nutrition with the bowel obstruction. I will discuss switching to aspirin or Lovenox with cardiology  5. Hypokalemia-the potassium is now in the normal range  6. Nutrition-TNA started on 03/28/2012  She is tolerating liquids with the NG tube out. Hopefully the bowel obstruction has partially resolved with bowel rest.  The plan is to begin systemic therapy with Taxol and carboplatin on 03/31/2012. The Taxol will be given on a weekly schedule with every three-week carboplatin. I reviewed the potential toxicities associated with this chemotherapy regimen. She understands the chance of nausea/vomiting, mucositis, diarrhea, alopecia, and hematologic toxicity. We discussed the neuropathy and bone pain associated with Taxol. She understands the potential for an allergic reaction.  I will discuss the case with cardiology today. Hopefully the cardiac medications can be changed to an oral regimen so that she can be transferred to the oncology unit for chemotherapy. We will initiate chemotherapy teaching today.    LOS: 6 days   Victoria [name]  03/30/2012, 1:31 PM

## 2012-03-30 NOTE — Progress Notes (Addendum)
ANTICOAGULATION CONSULT NOTE - Follow Up Consult  Pharmacy Consult for IV heparin Indication: atrial fibrillation, off coumadin  Allergies  Allergen Reactions  . Pindolol Swelling    Throat swelled up  . Iodine   . Shellfish Allergy   . Vibramycin (Doxycycline Calcium) Nausea Only  . Doxycycline Nausea Only    Patient Measurements: Height: 5\' 1"  (154.9 cm) Weight: 139 lb (63.05 kg) IBW/kg (Calculated) : 47.8   Vital Signs: Temp: 97.5 F (36.4 C) (09/05 0533) Temp src: Oral (09/05 0533) BP: 110/70 mmHg (09/05 0533) Pulse Rate: 68  (09/05 0533)  Labs:  [facility] 03/30/12 0432 03/29/12 1741 03/29/12 0449 03/28/12 0156  HGB -- -- 11.9* 11.4*  HCT -- -- 35.4* 33.4*  PLT -- -- 386 377  APTT -- -- -- --  LABPROT -- -- -- --  INR -- -- -- --  HEPARINUNFRC 0.37 0.60 0.15* --  CREATININE 0.60 -- 0.52 0.54  CKTOTAL -- -- -- --  CKMB -- -- -- --  TROPONINI -- -- -- --    Estimated Creatinine Clearance: 46.1 ml/min (by C-G formula based on Cr of 0.6).   Medications:  Scheduled:     . alteplase  2 mg Intracatheter Once  . feeding supplement  1 Container Oral TID BM  . insulin aspart  0-9 Units Subcutaneous Q6H  . metoprolol succinate  25 mg Oral Daily  . DISCONTD: feeding supplement  1 Container Oral TID BM   Infusions:     . diltiazem (CARDIZEM) infusion 5 mg/hr (03/30/12 0127)  . TPN (CLINIMIX) +/- additives 60 mL/hr at 03/29/12 1806   And  . fat emulsion 240 mL (03/29/12 1805)  . heparin 1,500 Units/hr (03/29/12 1608)  . 0.9 % sodium chloride with kcl 60 mL/hr at 03/29/12 1143  . 0.9 % sodium chloride with kcl 40 mL/hr at 03/30/12 0835  . TPN (CLINIMIX) +/- additives 40 mL/hr at 03/28/12 1905  . DISCONTD: amiodarone (NEXTERONE PREMIX) 360 mg/200 mL dextrose 0.5 mg/min (03/29/12 1143)  . DISCONTD: heparin 1,350 Units/hr (03/28/12 2127)   PRN: acetaminophen, acetaminophen, metoprolol, ondansetron (ZOFRAN) IV, phenol, promethazine, sodium  chloride  Assessment:  78 yo F on Coumadin PTA for h/o afib. Coumadin currently on hold given SBO.  Heparin level this AM remains therapeutic, but has dropped and is at lower end of goal range  No bleeding complications reported in chart notes.  Will recheck a heparin level to make sure it doesn't continue to trend down.  Goal of Therapy:  Heparin level 0.3-0.7 units/ml Monitor platelets by anticoagulation protocol: Yes   Plan:   Continue heparin at present rate of 1500 units/hour  Recheck heparin level to confirm it remains therapeutic.  Daily CBC and heparin level.  Darrol Angel, PharmD Pager: [phone] 03/30/2012,8:47 AM   ADDEDNUM: Repeat heparin level remains therapeutic (0.52). No bleeding mentioned in chart.  - Continue heparin 1500 units/hr - F/U daily HL  Darrol Angel, PharmD Pager: [phone] 03/30/2012 12:02 PM

## 2012-03-30 NOTE — Progress Notes (Signed)
The lumen of the patient's PICC line which was connected to her cardizem drip clotted off.  IV nurse was notified and ordered TPA.  In the meantime cardizem has been y-sited in with the heparin and NS/30K line.  I called the pharmacist at [facility] and verified with her that this was okay before doing so.  Will continue to monitor.

## 2012-03-30 NOTE — Progress Notes (Signed)
Subjective: No CP/SOB  Objective: Vital signs in last 24 hours: Temp:  [97.5 F (36.4 C)-98.3 F (36.8 C)] 98 F (36.7 C) (09/05 1410) Pulse Rate:  [68-131] 131  (09/05 1410) Resp:  [18-20] 18  (09/05 1410) BP: (110-157)/(70-91) 123/85 mmHg (09/05 1410) SpO2:  [96 %-97 %] 96 % (09/05 1410) Weight change:  Last BM Date: 03/30/12 Intake/Output from previous day: +236 09/04 0701 - 09/05 0700 In: 2098.8 [I.V.:1189.7; TPN:909.2] Out: 1450 [Urine:900; Emesis/NG output:550] Intake/Output this shift: Total I/O In: 1280 [P.O.:240; I.V.:480; TPN:560] Out: 750 [Urine:750]  PE: General:NCAT Heart:Irreg Lungs:Clear NWG:NFAO, NT     Lab Results:  [facility] 03/30/12 1110 03/29/12 0449  WBC 11.9* 14.0*  HGB 12.2 11.9*  HCT 35.7* 35.4*  PLT 363 386   BMET  Basename 03/30/12 0432 03/29/12 0449  NA 131* 129*  K 3.9 3.7  CL 97 95*  CO2 28 26  GLUCOSE 107* 151*  BUN 7 5*  CREATININE 0.60 0.52  CALCIUM 8.5 8.4   No results found for this basename: TROPONINI:2,CK,MB:2 in the last 72 hours  Lab Results  Component Value Date   CHOL 100 03/29/2012   HDL 43 05/15/2008   LDLCALC  Value: 82        Total Cholesterol/HDL:CHD Risk Coronary Heart Disease Risk Table                     Men   Women  1/2 Average Risk   3.4   3.3 05/15/2008   TRIG 97 03/29/2012   CHOLHDL 3.3 05/15/2008   No results found for this basename: HGBA1C       Hepatic Function Panel  Basename 03/30/12 0432  PROT 5.1*  ALBUMIN 2.2*  AST 21  ALT 10  ALKPHOS 99  BILITOT 0.2*  BILIDIR --  IBILI --    Basename 03/29/12 0449  CHOL 100   No results found for this basename: PROTIME in the last 72 hours    EKG: Orders placed during the hospital encounter of 03/24/12  . EKG 12-LEAD  . EKG 12-LEAD  . EKG 12-LEAD  . EKG 12-LEAD  . EKG 12-LEAD  . EKG 12-LEAD  . EKG 12-LEAD  . EKG 12-LEAD    Studies/Results: No results found.  Medications: I have reviewed the patient's current medications.    Marland Kitchen  alteplase  2 mg Intracatheter Once  . dexamethasone  10 mg Oral Once  . dexamethasone  10 mg Oral Once  . feeding supplement  1 Container Oral TID BM  . insulin aspart  0-9 Units Subcutaneous Q6H  . metoprolol succinate  25 mg Oral Daily   Assessment/Plan: Principal Problem:  *Small bowel obstruction Active Problems:  Rectal carcinoma  Atrial fibrillation  Chronic anticoagulation  CAD (coronary artery disease)  S/P placement of cardiac pacemaker  Hyponatremia  Pelvic mass in female, necrotic  PLAN: to begin chemo, currently taking po liquids.  ? Leave on IV DILT and hep for now and monitor if problems with po PEG will be placed.    LOS: 6 days   INGOLD,LAURA R 03/30/2012, 4:24 PM Agree with note written by Nada Boozer RNP  Afib with CVR. Pacer interrogated by [facility] and reprogrammed. Now has a partial SBO and on clear liqs. Agree that at this point coumadin not a good idea therefore no plans for DCCV especially since pt is asymptomatic. Treatment of her oncologic problem more pressing. Can change IV to PO dilt. Transfer to Onc floor (non tele)  OK with me. Will see again Monday unless we are needed over the weekend.  Runell Gess 03/30/2012 5:21 PM

## 2012-03-30 NOTE — Progress Notes (Signed)
Patient ID: Victoria [name], female   DOB: [birth year], 78 y.o.   MRN: [id number]  General Surgery - [facility] - Progress Note  Subjective: Patient eating clear liquid breakfast.  No nausea or emesis.  No pain.  Two small formed BM's this AM.  Family at bedside.  Objective: Vital signs in last 24 hours: Temp:  [97.5 F (36.4 C)-98.3 F (36.8 C)] 97.5 F (36.4 C) (09/05 0533) Pulse Rate:  [68-108] 68  (09/05 0533) Resp:  [18-20] 20  (09/05 0533) BP: (110-157)/(70-91) 110/70 mmHg (09/05 0533) SpO2:  [95 %-97 %] 96 % (09/05 0533) Last BM Date: 03/30/12  Intake/Output from previous day: 09/04 0701 - 09/05 0700 In: 2098.8 [I.V.:1189.7; TPN:909.2] Out: 1450 [Urine:900; Emesis/NG output:550]  Exam: HEENT - clear, not icteric Neck - soft Chest - clear bilaterally Cor - rate controlled Abd - mild distension; BS present; non-tender Ext - no significant edema Neuro - grossly intact, no focal deficits  Lab Results:   Basename 03/29/12 0449 03/28/12 0156  WBC 14.0* 14.3*  HGB 11.9* 11.4*  HCT 35.4* 33.4*  PLT 386 377     Basename 03/30/12 0432 03/29/12 0449  NA 131* 129*  K 3.9 3.7  CL 97 95*  CO2 28 26  GLUCOSE 107* 151*  BUN 7 5*  CREATININE 0.60 0.52  CALCIUM 8.5 8.4    Studies/Results: No results found.  Assessment / Plan: 1.  Partial SBO secondary to malignancy  - continue clear liquid diet  - OOB, ambulate  - chemo Rx to begin per medical oncology  - will continue to follow with you - no immediate surgical intervention planned  - if recurrent signs or symptoms of obstruction, would plan GI consult (Dr. Marina Goodell) for gastrostomy tube plaacement  Velora Heckler, MD, [facility] Office: [phone]  03/30/2012

## 2012-03-31 ENCOUNTER — Other Ambulatory Visit: Payer: Self-pay | Admitting: *Deleted

## 2012-03-31 ENCOUNTER — Inpatient Hospital Stay (HOSPITAL_COMMUNITY): Payer: Medicare Other

## 2012-03-31 DIAGNOSIS — R110 Nausea: Secondary | ICD-10-CM

## 2012-03-31 LAB — HEPARIN LEVEL (UNFRACTIONATED): Heparin Unfractionated: 0.74 IU/mL — ABNORMAL HIGH (ref 0.30–0.70)

## 2012-03-31 LAB — COMPREHENSIVE METABOLIC PANEL
ALT: 15 U/L (ref 0–35)
AST: 27 U/L (ref 0–37)
Albumin: 2.6 g/dL — ABNORMAL LOW (ref 3.5–5.2)
Alkaline Phosphatase: 116 U/L (ref 39–117)
CO2: 25 mEq/L (ref 19–32)
Chloride: 94 mEq/L — ABNORMAL LOW (ref 96–112)
GFR calc non Af Amer: 84 mL/min — ABNORMAL LOW (ref >90)
Potassium: 4.2 mEq/L (ref 3.5–5.1)
Total Bilirubin: 0.3 mg/dL (ref 0.3–1.2)

## 2012-03-31 LAB — GLUCOSE, CAPILLARY
Glucose-Capillary: 159 mg/dL — ABNORMAL HIGH (ref 70–99)
Glucose-Capillary: 160 mg/dL — ABNORMAL HIGH (ref 70–99)
Glucose-Capillary: 173 mg/dL — ABNORMAL HIGH (ref 70–99)

## 2012-03-31 MED ORDER — DILTIAZEM HCL 100 MG IV SOLR
5.0000 mg/h | INTRAVENOUS | Status: DC
  Administered 2012-03-31: 10 mg/h via INTRAVENOUS
  Administered 2012-03-31 – 2012-04-04 (×14): 15 mg/h via INTRAVENOUS
  Administered 2012-04-05 – 2012-04-08 (×7): 10 mg/h via INTRAVENOUS
  Administered 2012-04-09: 15 mg/h via INTRAVENOUS
  Administered 2012-04-09: 10 mg/h via INTRAVENOUS
  Administered 2012-04-10 – 2012-04-13 (×11): 15 mg/h via INTRAVENOUS
  Filled 2012-03-31 (×36): qty 100

## 2012-03-31 MED ORDER — METOPROLOL TARTRATE 1 MG/ML IV SOLN
2.5000 mg | Freq: Four times a day (QID) | INTRAVENOUS | Status: DC
  Administered 2012-04-01 – 2012-04-13 (×46): 2.5 mg via INTRAVENOUS
  Filled 2012-03-31 (×53): qty 5

## 2012-03-31 MED ORDER — ZINC TRACE METAL 1 MG/ML IV SOLN
INTRAVENOUS | Status: AC
  Administered 2012-03-31: 18:00:00 via INTRAVENOUS
  Filled 2012-03-31: qty 2000

## 2012-03-31 MED ORDER — HEPARIN (PORCINE) IN NACL 100-0.45 UNIT/ML-% IJ SOLN
1200.0000 [IU]/h | INTRAMUSCULAR | Status: DC
  Filled 2012-03-31: qty 250

## 2012-03-31 MED ORDER — FAT EMULSION 20 % IV EMUL
240.0000 mL | INTRAVENOUS | Status: AC
  Administered 2012-03-31: 240 mL via INTRAVENOUS
  Filled 2012-03-31: qty 250

## 2012-03-31 MED ORDER — ENOXAPARIN SODIUM 40 MG/0.4ML ~~LOC~~ SOLN
40.0000 mg | SUBCUTANEOUS | Status: DC
  Administered 2012-03-31 – 2012-04-13 (×14): 40 mg via SUBCUTANEOUS
  Filled 2012-03-31 (×15): qty 0.4

## 2012-03-31 MED ORDER — HEPARIN (PORCINE) IN NACL 100-0.45 UNIT/ML-% IJ SOLN
1400.0000 [IU]/h | INTRAMUSCULAR | Status: DC
  Filled 2012-03-31: qty 250

## 2012-03-31 NOTE — Progress Notes (Signed)
Subjective as below.  Ng placed - already 1000cc of green fluid in cannister. Feels much better. Not as distended. No BM overnight. Multiple bms yesterday am & day. Trace flatus  Nontoxic, pleasant Soft, nt, some distension  Npo, ng tube,  TPN Bowel rest  Explained to pt and family it looks like bowels are kinked again. Explained that due to the complexity of her situation - nothing definitively going to happen over weekend. Explained that it will require Dr Gerrit Friends, Gyn/onc, oncology to re-visit the plan of neoadjuvant chemo. I did NOT say surgery was going to happen. Pt is still higher risk for perioperative complications given her nutritional status. Explained to family i would alert gyn/onc team about change in her status. Will send note to Dr Carylon Perches. Andrey Campanile, MD, FACS General, Bariatric, & Minimally Invasive Surgery [facility], [place]

## 2012-03-31 NOTE — Progress Notes (Signed)
PARENTERAL NUTRITION CONSULT NOTE - Follow Up  Pharmacy Consult for TNA  Indication: SBO 2/2 neoplasm  Allergies  Allergen Reactions  . Pindolol Swelling    Throat swelled up  . Iodine   . Shellfish Allergy   . Vibramycin (Doxycycline Calcium) Nausea Only  . Doxycycline Nausea Only   Patient Measurements: Height: 5\' 1"  (154.9 cm) Weight: 139 lb (63.05 kg) IBW/kg (Calculated) : 47.8   Vital Signs: Temp: 97.3 F (36.3 C) (09/06 0459) Temp src: Oral (09/06 0459) BP: 128/85 mmHg (09/06 1028) Pulse Rate: 113  (09/06 0459) Intake/Output from previous day: 09/05 0701 - 09/06 0700 In: 3180 [P.O.:240; I.V.:1258; IV Piggyback:2; TPN:1680] Out: 875 [Urine:875] Intake/Output from this shift:    Labs:  [facility] 03/30/12 1110 03/29/12 0449  WBC 11.9* 14.0*  HGB 12.2 11.9*  HCT 35.7* 35.4*  PLT 363 386  APTT -- --  INR -- --     Basename 03/31/12 0503 03/30/12 0432 03/29/12 0449  NA 129* 131* 129*  K 4.2 3.9 3.7  CL 94* 97 95*  CO2 25 28 26   GLUCOSE 145* 107* 151*  BUN 11 7 5*  CREATININE 0.58 0.60 0.52  LABCREA -- -- --  CREAT24HRUR -- -- --  CALCIUM 9.0 8.5 8.4  MG -- 1.6 1.5  PHOS -- 2.7 2.1*  PROT 6.0 5.1* 5.3*  ALBUMIN 2.6* 2.2* 2.2*  AST 27 21 20   ALT 15 10 9   ALKPHOS 116 99 101  BILITOT 0.3 0.2* 0.2*  BILIDIR -- -- --  IBILI -- -- --  PREALBUMIN -- -- 6.5*  TRIG -- -- 97  CHOLHDL -- -- --  CHOL -- -- 100   Estimated Creatinine Clearance: 46.1 ml/min (by C-G formula based on Cr of 0.58).    Basename 03/31/12 0623 03/30/12 2306 03/30/12 1812  GLUCAP 173* 129* 99   CBGs & Insulin requirements past 24 hours:   CBGs <150 on sensitive SSI Q6h with one outlier of 173 this AM  Nutritional Goals:   9/4 RD recs: Kcal 1650-1850, Protein: 65-75g, Fluid > 1.7L  Clinimix E 5/20 @ 60 ml/hr + IVF 20% on MWF will provide 72g protein/day, 1746 Kcal on MWF and 1267 Kcal on other days for an average of 1473 Kcal per day of the week.  Current nutrition:    Diet: CLD (9/4) - tolerating  Resource Breeze TID (9/5)  IVF:  NS + 30 mEq/L KCl @ 40 ml/hr  Assessment:   78 yo F with rectal mass, s/p sigmoidoscopy 8/19 with pathology showing moderate to poorly differentiated adenocarcinoma, c/w a GYN primary.  Patient developed small bowel obstruction 2/2 neoplasm and without PO intake x 1 week.  TNA started 9/3. Plan for GYN oncology and surgery consult.   No history of DM - CBGs ok since starting TNA    Per [facility] note, pt is consuming CLD without nausea or emesis. 2 BMs this am.   Plan per [facility] is to continue CLD, no immediate surgical intervention planned, gastrostomy tube placement only if S/Sx of obstruction recur.     Renal/hepatic function: wnl   Electrolytes:  Na slightly low (unable to correct in TNA).  Corrected Ca wnl  Pre-Albumin: 6.5  TG/Cholesterol: wnl  Plan:    Continue Clinimix E 5/20 at 60 ml/hr  TNA to contain IV fat emulsion, standard multivitamins and trace elements only on MWF only due to ongoing shortage  Continue sensitive SSI q6h  TNA labs Monday/Thursdays  Pharmacy will follow up daily  Hopefully if tolerates diet advancement can wean and D/C TNA soon.   Hessie Knows, PharmD, BCPS Pager [phone] 03/31/2012 11:01 AM

## 2012-03-31 NOTE — Progress Notes (Signed)
General Surgery Note  LOS: 7 days  Room - 1440  Assessment/Plan: 1. Small bowel obstruction.   Secondary to malignancy, possibly wide spread.  Though it sounded like she was doing better yesterday and was having BM's, has more distention today.  In speaking with Dr. Gerrit Friends, the plan was to advance her diet as tolerated.  If she became obstructed again (or never resolved), the idea was to get GI involved in placing a decompressive gastrostomy tube.  He thought surgery at this time had little chance of improving her obstruction.  The was also the sentiments of Gyn Onc.  Dr. Gerrit Friends is out of town for a few days and I will follow the patient for now.  Will follow.  2.  Rectal carcinoma (9.4 cm by PET CT scan) - probably GYN primary  Seen by Dr. Laurette Schimke  3.  Atrial fibrillation  Seen by Dr. Erlene Quan 4.  Chronic anticoagulation   On IV Heparin - Heparin level 0.52 on 03/30/2012 5.  CAD (coronary artery disease) (03/09/2012)  6.  S/P placement of cardiac pacemaker (03/09/2012) 7.  Malnutrition - Alb - 2.6  Prealbunim - 6.5 on 03/29/2012  On TPN  Subjective:  Had BM's yesterday, but appears to be having more trouble today.  Her daughter in law is in the room with her and asked and answered most of the questions.  Ms. Erker had just been medicated for nausea and did not say much. Objective:   Filed Vitals:   03/31/12 1028  BP: 128/85  Pulse:   Temp:   Resp:      Intake/Output from previous day:  09/05 0701 - 09/06 0700 In: 3180 [P.O.:240; I.V.:1258; IV Piggyback:2; TPN:1680] Out: 875 [Urine:875]  Intake/Output this shift:      Physical Exam:   General: Older WF who is drowsy.Marland Kitchen    HEENT: Normal. Pupils equal. .   Lungs: Clear.   Abdomen: Distended, but soft.  Bowel sounds few.   Neurologic:  Drowsy from antiemetic medication.   Lab Results:    [facility] 03/30/12 1110 03/29/12 0449  WBC 11.9* 14.0*  HGB 12.2 11.9*  HCT 35.7* 35.4*  PLT 363 386    BMET   Basename  03/31/12 0503 03/30/12 0432  NA 129* 131*  K 4.2 3.9  CL 94* 97  CO2 25 28  GLUCOSE 145* 107*  BUN 11 7  CREATININE 0.58 0.60  CALCIUM 9.0 8.5    PT/INR  No results found for this basename: LABPROT:2,INR:2 in the last 72 hours  ABG  No results found for this basename: PHART:2,PCO2:2,PO2:2,HCO3:2 in the last 72 hours   Studies/Results:  No results found.   Anti-infectives:   Anti-infectives    None     Ovidio Kin, MD, FACS Pager: [phone],   [facility] Office: [phone] 03/31/2012

## 2012-03-31 NOTE — Progress Notes (Signed)
IP PROGRESS NOTE  Subjective:  Victoria [name] developed nausea during the night with increased abdominal distention. This has progressed today. Victoria [name] continues to pass flatus. Victoria [name] reports mild abdominal pain. Victoria [name] vomited this afternoon.   Objective: Vital signs in last 24 hours: Blood pressure 127/86, pulse 115, temperature 97.5 F (36.4 C), temperature source Oral, resp. rate 18, height 5\' 1"  (1.549 m), weight 139 lb (63.05 kg), SpO2 94.00%.  Intake/Output from previous day: 09/05 0701 - 09/06 0700 In: 3180 [P.O.:240; I.V.:1258; IV Piggyback:2; TPN:1680] Out: 875 [Urine:875]  Physical Exam: Lungs: Decreased breath sounds at the right greater than left lower chest, no respiratory distress, inspiratory rhonchi at the left base Cardiac: Regular rate and rhythm, tachycardia Abdomen: Distended, bowel sounds are present Extremities: No leg edema     Lab Results:  [facility] 03/30/12 1110 03/29/12 0449  WBC 11.9* 14.0*  HGB 12.2 11.9*  HCT 35.7* 35.4*  PLT 363 386    BMET  Basename 03/31/12 0503 03/30/12 0432  NA 129* 131*  K 4.2 3.9  CL 94* 97  CO2 25 28  GLUCOSE 145* 107*  BUN 11 7  CREATININE 0.58 0.60  CALCIUM 9.0 8.5   Heparin level 0.74  Medications: I have reviewed the patient's current medications.  Assessment/Plan:  1. 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 A staging CT scan 03/02/2012 confirmed a mass at the right aspect of the rectum with nodal masses in the abdomen/pelvis. Elevated CA 125. Normal CEA  2. Small bowel obstruction secondary to #1-the NG tube was removed on 03/29/2012. Victoria [name] now has recurrent nausea/vomiting and the abdomen is distended. The NG tube was reinserted this afternoon.  3. atrial fibrillation/flutter-medical therapy per cardiology-Victoria [name] appears to have a regular rhythm at present. I discussed the  case with Dr. Royann Shivers. He will continue medical therapy for rate control. No plan for cardioversion at present.  4. Anticoagulation-Coumadin on hold, Victoria [name] received low-dose vitamin K in the p.m. on 03/24/2012. Victoria [name] has been on IV heparin during this hospital admission. I discussed the risk/benefit of continuing anticoagulation therapy with cardiology. Heparin will be discontinued. Victoria [name] will begin aspirin prophylaxis when taking by mouth. I will start prophylactic dose Lovenox.  5. Hypokalemia-the potassium is now in the normal range  6. Nutrition-TNA started on 03/28/2012  Victoria [name] has a persistent bowel obstruction. An NG tube was reinserted this afternoon. I discussed the case with Dr. Andrey Campanile and he will see her today. Victoria [name] was scheduled to begin Taxol/carboplatin chemotherapy today. This will be placed on hold.  I discussed the case with the GI service and Dr. Christella Hartigan does not recommend placement of a gastrostomy tube.  The plan is to continue supportive care with the NG tube, medical therapy for atrial fibrillation/atrial flutter, and TNA. We will decide on surgical intervention versus beginning Taxol/carboplatin chemotherapy over the next few days.  I discussed the current status and treatment plans with her family.    LOS: 7 days   Victoria [name]  03/31/2012, 4:22 PM

## 2012-03-31 NOTE — Progress Notes (Signed)
[facility]  DAILY PROGRESS NOTE   Subjective:  Unfortunately, recurrent bowel obstruction symptoms have led to need to discontinue PO meds. Remains in AFlutter with 2:1 AV block, "stuck"at 120 bpm.  No cardiovascular subjective complaints however. There is still an expectation of upcoming abdominal surgery. Objective:  Temp:  [97.3 F (36.3 C)-98.1 F (36.7 C)] 97.5 F (36.4 C) (09/06 1406) Pulse Rate:  [113-132] 116  (09/06 1759) Resp:  [18-20] 18  (09/06 1406) BP: (111-131)/(76-91) 111/76 mmHg (09/06 1856) SpO2:  [91 %-94 %] 94 % (09/06 1406) Weight change:   Intake/Output from previous day: 09/05 0701 - 09/06 0700 In: 3180 [P.O.:240; I.V.:1258; IV Piggyback:2; TPN:1680] Out: 875 [Urine:875]  Intake/Output from this shift:    Medications: Current Facility-Administered Medications  Medication Dose Route Frequency Provider Last Rate Last Dose  . acetaminophen (TYLENOL) tablet 650 mg  650 mg Oral Q6H PRN Rana Snare, NP   650 mg at 03/25/12 2015   Or  . acetaminophen (TYLENOL) suppository 650 mg  650 mg Rectal Q6H PRN Rana Snare, NP      . dexamethasone (DECADRON) tablet 10 mg  10 mg Oral Once Ladene Artist, MD   10 mg at 03/30/12 2253  . dexamethasone (DECADRON) tablet 10 mg  10 mg Oral Once Ladene Artist, MD   10 mg at 03/31/12 0630  . diltiazem (CARDIZEM) 100 mg in dextrose 5 % 100 mL infusion  5-15 mg/hr Intravenous Titrated Rajean Desantiago, MD 15 mL/hr at 03/31/12 1821 15 mg/hr at 03/31/12 1821  . enoxaparin (LOVENOX) injection 40 mg  40 mg Subcutaneous Q24H Ladene Artist, MD      . TPN [facility]) +/- additives   Intravenous Continuous TPN Berkley Harvey, [place] 60 mL/hr at 03/31/12 1747     And  . fat emulsion 20 % infusion 240 mL  240 mL Intravenous Continuous TPN Berkley Harvey, PHARMD 10 mL/hr at 03/31/12 1747 240 mL at 03/31/12 1747  . insulin aspart (novoLOG) injection 0-9 Units  0-9 Units Subcutaneous Q6H  Theda Sers, PHARMD   2 Units at 03/31/12 1753  . metoprolol (LOPRESSOR) injection 2.5 mg  2.5 mg Intravenous Q6H Lewellyn Fultz, MD      . metoprolol (LOPRESSOR) injection 5 mg  5 mg Intravenous Q4H PRN Levert Feinstein, MD   5 mg at 03/26/12 0600  . ondansetron (ZOFRAN) injection 4 mg  4 mg Intravenous Q6H PRN Rana Snare, NP   4 mg at 03/31/12 1249  . phenol (CHLORASEPTIC) mouth spray 1 spray  1 spray Mouth/Throat PRN Velora Heckler, MD      . promethazine (PHENERGAN) injection 12.5 mg  12.5 mg Intravenous Q4H PRN Velora Heckler, MD   12.5 mg at 03/31/12 1020  . sodium chloride 0.9 % 1,000 mL with potassium chloride 30 mEq infusion   Intravenous Continuous Thuyvan Stacey Drain, PHARMD 40 mL/hr at 03/31/12 0636    . sodium chloride 0.9 % injection 10-40 mL  10-40 mL Intracatheter PRN Samul Dada, MD   10 mL at 03/30/12 0909  . TPN (CLINIMIX) +/- additives   Intravenous Continuous TPN Annia Belt, PHARMD 60 mL/hr at 03/30/12 1836    . DISCONTD: diltiazem (CARDIZEM) tablet 60 mg  60 mg Oral Q8H Runell Gess, MD   60 mg at 03/31/12 1028  . DISCONTD: feeding supplement (RESOURCE BREEZE) liquid 1 Container  1 Container Oral TID BM Jeoffrey Massed, RD  1 Container at 03/30/12 1058  . DISCONTD: heparin ADULT infusion 100 units/mL (25000 units/250 mL)  1,500 Units/hr Intravenous Continuous Theda Sers, PHARMD 15 mL/hr at 03/31/12 0336 1,500 Units/hr at 03/31/12 0336  . DISCONTD: heparin ADULT infusion 100 units/mL (25000 units/250 mL)  1,400 Units/hr Intravenous Continuous Berkley Harvey, [place] 14 mL/hr at 03/31/12 0740 1,400 Units/hr at 03/31/12 0740  . DISCONTD: heparin ADULT infusion 100 units/mL (25000 units/250 mL)  1,200 Units/hr Intravenous Continuous Rollene Fare, PHARMD      . DISCONTD: metoprolol succinate (TOPROL-XL) 24 hr tablet 25 mg  25 mg Oral Daily Rana Snare, NP   25 mg at 03/31/12 1028    Physical Exam: General appearance: alert, cooperative  and no distress Neck: no adenopathy, no carotid bruit, no JVD, supple, symmetrical, trachea midline and thyroid not enlarged, symmetric, no tenderness/mass/nodules Lungs: diminished breath sounds bibasilar Heart: regular rate and rhythm and S1, S2 normal Abdomen: distended, reduced bowel sounds are still present Extremities: extremities normal, atraumatic, no cyanosis or edema Neurologic: Grossly normal  Lab Results: Results for orders placed during the hospital encounter of 03/24/12 (from the past 48 hour(s))  GLUCOSE, CAPILLARY     Status: Abnormal   Collection Time   03/30/12 12:32 AM      Component Value Range Comment   Glucose-Capillary 141 (*) 70 - 99 mg/dL   HEPARIN LEVEL (UNFRACTIONATED)     Status: Normal   Collection Time   03/30/12  4:32 AM      Component Value Range Comment   Heparin Unfractionated 0.37  0.30 - 0.70 IU/mL   COMPREHENSIVE METABOLIC PANEL     Status: Abnormal   Collection Time   03/30/12  4:32 AM      Component Value Range Comment   Sodium 131 (*) 135 - 145 mEq/L    Potassium 3.9  3.5 - 5.1 mEq/L    Chloride 97  96 - 112 mEq/L    CO2 28  19 - 32 mEq/L    Glucose, Bld 107 (*) 70 - 99 mg/dL    BUN 7  6 - 23 mg/dL    Creatinine, Ser 1.61  0.50 - 1.10 mg/dL    Calcium 8.5  8.4 - 09.6 mg/dL    Total Protein 5.1 (*) 6.0 - 8.3 g/dL    Albumin 2.2 (*) 3.5 - 5.2 g/dL    AST 21  0 - 37 U/L    ALT 10  0 - 35 U/L    Alkaline Phosphatase 99  39 - 117 U/L    Total Bilirubin 0.2 (*) 0.3 - 1.2 mg/dL    GFR calc non Af Amer 83 (*) >90 mL/min    GFR calc Af Amer >90  >90 mL/min   MAGNESIUM     Status: Normal   Collection Time   03/30/12  4:32 AM      Component Value Range Comment   Magnesium 1.6  1.5 - 2.5 mg/dL   PHOSPHORUS     Status: Normal   Collection Time   03/30/12  4:32 AM      Component Value Range Comment   Phosphorus 2.7  2.3 - 4.6 mg/dL   GLUCOSE, CAPILLARY     Status: Abnormal   Collection Time   03/30/12  6:33 AM      Component Value Range Comment    Glucose-Capillary 149 (*) 70 - 99 mg/dL   CBC     Status: Abnormal   Collection Time  03/30/12 11:10 AM      Component Value Range Comment   WBC 11.9 (*) 4.0 - 10.5 K/uL    RBC 4.17  3.87 - 5.11 MIL/uL    Hemoglobin 12.2  12.0 - 15.0 g/dL    HCT 81.1 (*) 91.4 - 46.0 %    MCV 85.6  78.0 - 100.0 fL    MCH 29.3  26.0 - 34.0 pg    MCHC 34.2  30.0 - 36.0 g/dL    RDW 78.2  95.6 - 21.3 %    Platelets 363  150 - 400 K/uL   HEPARIN LEVEL (UNFRACTIONATED)     Status: Normal   Collection Time   03/30/12 11:10 AM      Component Value Range Comment   Heparin Unfractionated 0.52  0.30 - 0.70 IU/mL   GLUCOSE, CAPILLARY     Status: Abnormal   Collection Time   03/30/12 11:44 AM      Component Value Range Comment   Glucose-Capillary 142 (*) 70 - 99 mg/dL   GLUCOSE, CAPILLARY     Status: Normal   Collection Time   03/30/12  6:12 PM      Component Value Range Comment   Glucose-Capillary 99  70 - 99 mg/dL   GLUCOSE, CAPILLARY     Status: Abnormal   Collection Time   03/30/12 11:06 PM      Component Value Range Comment   Glucose-Capillary 129 (*) 70 - 99 mg/dL   HEPARIN LEVEL (UNFRACTIONATED)     Status: Abnormal   Collection Time   03/31/12  5:03 AM      Component Value Range Comment   Heparin Unfractionated 0.74 (*) 0.30 - 0.70 IU/mL   COMPREHENSIVE METABOLIC PANEL     Status: Abnormal   Collection Time   03/31/12  5:03 AM      Component Value Range Comment   Sodium 129 (*) 135 - 145 mEq/L    Potassium 4.2  3.5 - 5.1 mEq/L    Chloride 94 (*) 96 - 112 mEq/L    CO2 25  19 - 32 mEq/L    Glucose, Bld 145 (*) 70 - 99 mg/dL    BUN 11  6 - 23 mg/dL    Creatinine, Ser 0.86  0.50 - 1.10 mg/dL    Calcium 9.0  8.4 - 57.8 mg/dL    Total Protein 6.0  6.0 - 8.3 g/dL    Albumin 2.6 (*) 3.5 - 5.2 g/dL    AST 27  0 - 37 U/L    ALT 15  0 - 35 U/L    Alkaline Phosphatase 116  39 - 117 U/L    Total Bilirubin 0.3  0.3 - 1.2 mg/dL    GFR calc non Af Amer 84 (*) >90 mL/min    GFR calc Af Amer >90  >90 mL/min     GLUCOSE, CAPILLARY     Status: Abnormal   Collection Time   03/31/12  6:23 AM      Component Value Range Comment   Glucose-Capillary 173 (*) 70 - 99 mg/dL   GLUCOSE, CAPILLARY     Status: Abnormal   Collection Time   03/31/12 11:55 AM      Component Value Range Comment   Glucose-Capillary 160 (*) 70 - 99 mg/dL   HEPARIN LEVEL (UNFRACTIONATED)     Status: Abnormal   Collection Time   03/31/12  3:40 PM      Component Value Range Comment   Heparin Unfractionated  0.74 (*) 0.30 - 0.70 IU/mL   GLUCOSE, CAPILLARY     Status: Abnormal   Collection Time   03/31/12  5:41 PM      Component Value Range Comment   Glucose-Capillary 159 (*) 70 - 99 mg/dL    Comment 1 Documented in Chart      Comment 2 Notify RN       Imaging: Dg Abd 2 Views  03/31/2012  *RADIOLOGY REPORT*  Clinical Data: Distended abdomen.  Evaluate for recurrent small bowel obstruction.  ABDOMEN - 2 VIEW  Comparison: Nine 09/03/2011.  Findings: A nasogastric tube is seen extending into the stomach. There is a paucity of distal colonic gas.  Multiple dilated loops of gas-filled small bowel are noted in the central abdomen and left side of the abdomen, measuring up to 5.1 cm in diameter.  Air fluid levels are present on the left lateral decubitus view.  No gross evidence of pneumoperitoneum.  IMPRESSION: 1.  Findings, as above, compatible with persistent small bowel obstruction. 2.  No pneumoperitoneum.   Original Report Authenticated By: Florencia Reasons, M.D.     Assessment:  1. Principal Problem: 2.  *Small bowel obstruction 3. Active Problems: 4.  Rectal carcinoma 5.  Atrial fibrillation 6.  Chronic anticoagulation 7.  CAD (coronary artery disease) 8.  S/P placement of cardiac pacemaker 9.  Hyponatremia 10.  Pelvic mass in female, necrotic 11.   Plan:  1. Discussed further care plan with Dr. Truett Perna. 2. As long as surgery is anticipated, we cannot guarantee uninterrupted anticoagulation. As a consequence, the risk of  stroke with cardioversion (while not prohibitive) is simply not worth it. She is tolerating the arrhythmia remarkably well. 3. As far as the immediate risk of stroke/embolism in the absence of cardioversion, this is reasonably low.  This is especially true since she has a organized, regular arrhythmia. (after cardioversion she may have stunning/mechanical standstill and paradoxically, a higher stroke risk if not on anticoagulants). Continuous IV heparin is therefore less beneficial and will leave the risk/benefit analysis in  Dr. Kalman Drape hands. 4. Hyponatremia secondary to third spacing/NG suction 5. Rate control is adequate, if not ideal, on iv diltiazem and metoprolol.  Ultimately her outcome is most dependent on the tumor mass and its intestinal effects.  Time Spent Directly with Patient:  30 minutes  Length of Stay:  LOS: 7 days    Victoria [name] 03/31/2012, 8:10 PM

## 2012-03-31 NOTE — Progress Notes (Signed)
Nutrition Follow-up  Intervention:   1. TPN per pharmacy, will continue to monitor 2. Advance diet as tolerated per MD  Assessment:   Patient receiving Clinimix 5/20 at 60 ml/hr with lipids at 10 ml/hr, trace elements, and MVI MWF due to national shortage. This is providing and 72 g protein (100% estimated needs) and an average of 1473 kcal (89% estimated needs). She is tolerating well. Intake of clear liquids minimal due to nausea. Plan for gastric tube placement if reoccurrence of obstruction noted.   Diet Order:  Clear liquids Supplements: Resource Breeze TID  Meds: Scheduled Meds:   . dexamethasone  10 mg Oral Once  . dexamethasone  10 mg Oral Once  . diltiazem  60 mg Oral Q8H  . feeding supplement  1 Container Oral TID BM  . insulin aspart  0-9 Units Subcutaneous Q6H  . metoprolol succinate  25 mg Oral Daily   Continuous Infusions:   . TPN (CLINIMIX) +/- additives 60 mL/hr at 03/29/12 1806   And  . fat emulsion 240 mL (03/29/12 1805)  . TPN (CLINIMIX) +/- additives     And  . fat emulsion    . heparin 1,400 Units/hr (03/31/12 0740)  . 0.9 % sodium chloride with kcl 40 mL/hr at 03/31/12 0636  . TPN (CLINIMIX) +/- additives 60 mL/hr at 03/30/12 1836  . DISCONTD: diltiazem (CARDIZEM) infusion 5 mg/hr (03/30/12 0127)  . DISCONTD: heparin 1,500 Units/hr (03/31/12 0336)   PRN Meds:.acetaminophen, acetaminophen, metoprolol, ondansetron (ZOFRAN) IV, phenol, promethazine, sodium chloride  Labs:  CMP     Component Value Date/Time   NA 129* 03/31/2012 0503   K 4.2 03/31/2012 0503   CL 94* 03/31/2012 0503   CO2 25 03/31/2012 0503   GLUCOSE 145* 03/31/2012 0503   BUN 11 03/31/2012 0503   CREATININE 0.58 03/31/2012 0503   CALCIUM 9.0 03/31/2012 0503   PROT 6.0 03/31/2012 0503   ALBUMIN 2.6* 03/31/2012 0503   AST 27 03/31/2012 0503   ALT 15 03/31/2012 0503   ALKPHOS 116 03/31/2012 0503   BILITOT 0.3 03/31/2012 0503   GFRNONAA 84* 03/31/2012 0503   GFRAA >90 03/31/2012 0503   Sodium  Date/Time Value  Range Status  03/31/2012  5:03 AM 129* 135 - 145 mEq/L Final  03/30/2012  4:32 AM 131* 135 - 145 mEq/L Final  03/29/2012  4:49 AM 129* 135 - 145 mEq/L Final    Potassium  Date/Time Value Range Status  03/31/2012  5:03 AM 4.2  3.5 - 5.1 mEq/L Final  03/30/2012  4:32 AM 3.9  3.5 - 5.1 mEq/L Final  03/29/2012  4:49 AM 3.7  3.5 - 5.1 mEq/L Final    Phosphorus  Date/Time Value Range Status  03/30/2012  4:32 AM 2.7  2.3 - 4.6 mg/dL Final  07/31/1094  0:45 AM 2.1* 2.3 - 4.6 mg/dL Final    Magnesium  Date/Time Value Range Status  03/30/2012  4:32 AM 1.6  1.5 - 2.5 mg/dL Final  4/0/9811  9:14 AM 1.5  1.5 - 2.5 mg/dL Final  01/31/2955  2:13 AM 1.5  1.5 - 2.5 mg/dL Final     Intake/Output Summary (Last 24 hours) at 03/31/12 1304 Last data filed at 03/31/12 1256  Gross per 24 hour  Intake   2940 ml  Output    275 ml  Net   2665 ml    Weight Status:  No new weight  Re-estimated needs:  1650-1850 kcal, 65-75 g protein  Nutrition Dx:  Inadequate oral intake, ongoing  Goal:  Meet 100% of estimated needs with TPN, not meeting  Monitor:  TPN, diet advancement, weight, labs   Linnell Fulling, RD, LDN Pager #: [phone] After-Hours Pager #: [phone]

## 2012-03-31 NOTE — Progress Notes (Signed)
NG placed, placement auscultated. NG hooked to low intermittent wall. 1200 cc of bile out of NG. Patient tolerated well. Will continue to monitor patient. Setzer, Don Broach

## 2012-03-31 NOTE — Progress Notes (Signed)
Patient ambulated x400 feet this AM.  Tolerated very well.  Still feeling some slight nausea, but still positive for flatus.  Does not have much appetite still.  Will continue to monitor.

## 2012-03-31 NOTE — Progress Notes (Signed)
  Subjective: Pt was doing well yesterday with taking clears and had multiple stools, 7 recorded.  Last night she started to become distended and today is very distended.  Objective: Vital signs in last 24 hours: Temp:  [97.3 F (36.3 C)-98.1 F (36.7 C)] 97.3 F (36.3 C) (09/06 0459) Pulse Rate:  [113-132] 113  (09/06 0459) Resp:  [18-20] 20  (09/06 0459) BP: (123-131)/(81-91) 128/85 mmHg (09/06 1028) SpO2:  [91 %-96 %] 92 % (09/06 0459) Last BM Date: 03/30/12  Diet: clears, afebrile, BP up some early AM, NA 129  Intake/Output from previous day: 09/05 0701 - 09/06 0700 In: 3180 [P.O.:240; I.V.:1258; IV Piggyback:2; TPN:1680] Out: 875 [Urine:875] Intake/Output this shift:    General appearance: alert, cooperative and no distress GI: distended, not tender, diminished BS, very little flatus.  Lab Results:   [facility] 03/30/12 1110 03/29/12 0449  WBC 11.9* 14.0*  HGB 12.2 11.9*  HCT 35.7* 35.4*  PLT 363 386    BMET  Basename 03/31/12 0503 03/30/12 0432  NA 129* 131*  K 4.2 3.9  CL 94* 97  CO2 25 28  GLUCOSE 145* 107*  BUN 11 7  CREATININE 0.58 0.60  CALCIUM 9.0 8.5   PT/INR No results found for this basename: LABPROT:2,INR:2 in the last 72 hours   Lab 03/31/12 0503 03/30/12 0432 03/29/12 0449 03/28/12 0156 03/27/12 0130  AST 27 21 20 20 27   ALT 15 10 9 10 14   ALKPHOS 116 99 101 103 117  BILITOT 0.3 0.2* 0.2* 0.2* 0.2*  PROT 6.0 5.1* 5.3* 4.9* 5.4*  ALBUMIN 2.6* 2.2* 2.2* 2.2* 2.4*     Lipase  No results found for this basename: lipase     Studies/Results: No results found.  Medications:    . dexamethasone  10 mg Oral Once  . dexamethasone  10 mg Oral Once  . diltiazem  60 mg Oral Q8H  . feeding supplement  1 Container Oral TID BM  . insulin aspart  0-9 Units Subcutaneous Q6H  . metoprolol succinate  25 mg Oral Daily    Assessment/Plan  Recurrent/ongoing Partial SBO secondary to rectal malignancy  Rectal mass, status post a sigmoidoscopy  03/13/2012 with the pathology showing a moderate to poorly differentiated adenocarcinoma consistent with Gyn primary. atrial fibrillation/flutter on  chronic anticoagulation Hypokalemia now resolved   Plan:  NPO, replace NG and suction. Will recheck labs and film tomorrow. NG being placed now.l      LOS: 7 days    Victoria [name] 03/31/2012  -

## 2012-03-31 NOTE — Progress Notes (Addendum)
ANTICOAGULATION CONSULT NOTE - Follow Up Consult  Pharmacy Consult for IV heparin Indication: atrial fibrillation, off coumadin  Allergies  Allergen Reactions  . Pindolol Swelling    Throat swelled up  . Iodine   . Shellfish Allergy   . Vibramycin (Doxycycline Calcium) Nausea Only  . Doxycycline Nausea Only    Patient Measurements: Height: 5\' 1"  (154.9 cm) Weight: 139 lb (63.05 kg) IBW/kg (Calculated) : 47.8   Vital Signs: Temp: 97.3 F (36.3 C) (09/06 0459) Temp src: Oral (09/06 0459) BP: 131/91 mmHg (09/06 0459) Pulse Rate: 113  (09/06 0459)  Labs:  Basename 03/31/12 0503 03/30/12 1110 03/30/12 0432 03/29/12 0449  HGB -- 12.2 -- 11.9*  HCT -- 35.7* -- 35.4*  PLT -- 363 -- 386  APTT -- -- -- --  LABPROT -- -- -- --  INR -- -- -- --  HEPARINUNFRC 0.74* 0.52 0.37 --  CREATININE 0.58 -- 0.60 0.52  CKTOTAL -- -- -- --  CKMB -- -- -- --  TROPONINI -- -- -- --    Estimated Creatinine Clearance: 46.1 ml/min (by C-G formula based on Cr of 0.58).   Medications:  Scheduled:     . alteplase  2 mg Intracatheter Once  . dexamethasone  10 mg Oral Once  . dexamethasone  10 mg Oral Once  . diltiazem  60 mg Oral Q8H  . feeding supplement  1 Container Oral TID BM  . insulin aspart  0-9 Units Subcutaneous Q6H  . metoprolol succinate  25 mg Oral Daily   Infusions:     . TPN (CLINIMIX) +/- additives 60 mL/hr at 03/29/12 1806   And  . fat emulsion 240 mL (03/29/12 1805)  . heparin 1,500 Units/hr (03/31/12 0336)  . 0.9 % sodium chloride with kcl 40 mL/hr at 03/31/12 0636  . TPN (CLINIMIX) +/- additives 60 mL/hr at 03/30/12 1836  . DISCONTD: diltiazem (CARDIZEM) infusion 5 mg/hr (03/30/12 0127)   PRN: acetaminophen, acetaminophen, metoprolol, ondansetron (ZOFRAN) IV, phenol, promethazine, sodium chloride  Assessment:  78 yo F on Coumadin PTA for h/o afib. Coumadin currently on hold given SBO.  Heparin level this AM now supratherapeutic after being therapeutic x 2  levels yesterday  No bleeding complications reported in chart notes.  Goal of Therapy:  Heparin level 0.3-0.7 units/ml Monitor platelets by anticoagulation protocol: Yes   Plan:   Reduce heparin to 1400 units/hr  Recheck heparin level in 8 hrs.  Daily CBC and heparin level.   Hessie Knows, PharmD, BCPS Pager [phone] 03/31/2012 7:25 AM  Addendum: Heparin level 0.74 again on 1400 units/hr. Minimal bleeding after NGT insertion reported per RN. Decrease rate further to 1200 units/hr and recheck in 8hrs.  [name], PharmD, BCPS 03/31/2012 4:22 PM

## 2012-03-31 NOTE — Progress Notes (Addendum)
Around 37 patient's son approached the nurse's station stating that his mother was burping and feeling nauseous.  Patient stated that she should not have had a second bowl of chicken broth today in the afternoon.  I gave her some Zofran and the nausea resolved; however when I went to give her the 200a dose of cardizem she was again feeling nauseous.  I administered a 2nd dose of zofran and she is now asleep.  She seems to feel as though she needs to have a bowel movement, has tried several times but has passed only liquid (stool or urine, can't tell) thus far.  She is positive for flatus.  Will continue to monitor.

## 2012-04-01 ENCOUNTER — Inpatient Hospital Stay (HOSPITAL_COMMUNITY): Payer: Medicare Other

## 2012-04-01 LAB — COMPREHENSIVE METABOLIC PANEL
ALT: 15 U/L (ref 0–35)
AST: 21 U/L (ref 0–37)
Alkaline Phosphatase: 98 U/L (ref 39–117)
GFR calc Af Amer: >90 mL/min (ref >90)
Glucose, Bld: 127 mg/dL — ABNORMAL HIGH (ref 70–99)
Potassium: 4.7 mEq/L (ref 3.5–5.1)
Sodium: 128 mEq/L — ABNORMAL LOW (ref 135–145)
Total Protein: 5.5 g/dL — ABNORMAL LOW (ref 6.0–8.3)

## 2012-04-01 LAB — CBC
MCH: 29.2 pg (ref 26.0–34.0)
MCHC: 33.8 g/dL (ref 30.0–36.0)
MCV: 86.4 fL (ref 78.0–100.0)
Platelets: 375 10*3/uL (ref 150–400)
RBC: 3.83 MIL/uL — ABNORMAL LOW (ref 3.87–5.11)
RDW: 14.4 % (ref 11.5–15.5)

## 2012-04-01 MED ORDER — CLINIMIX E/DEXTROSE (5/20) 5 % IV SOLN
INTRAVENOUS | Status: AC
  Administered 2012-04-01: 17:00:00 via INTRAVENOUS
  Filled 2012-04-01: qty 2000

## 2012-04-01 NOTE — Progress Notes (Signed)
PARENTERAL NUTRITION CONSULT NOTE - Follow Up  Pharmacy Consult for TNA  Indication: SBO 2/2 neoplasm  Allergies  Allergen Reactions  . Pindolol Swelling    Throat swelled up  . Iodine   . Shellfish Allergy   . Vibramycin (Doxycycline Calcium) Nausea Only  . Doxycycline Nausea Only   Patient Measurements: Height: 5\' 1"  (154.9 cm) Weight: 139 lb (63.05 kg) IBW/kg (Calculated) : 47.8   Vital Signs: Temp: 97.5 F (36.4 C) (09/07 0639) Temp src: Oral (09/07 0639) BP: 123/75 mmHg (09/07 0639) Pulse Rate: 77  (09/07 0639) Intake/Output from previous day: 09/06 0701 - 09/07 0700 In: 0  Out: 3150 [Urine:250; Emesis/NG output:2900] Intake/Output from this shift: Total I/O In: -  Out: 500 [Emesis/NG output:500]  Labs:  [facility] 04/01/12 0548 03/30/12 1110  WBC 16.0* 11.9*  HGB 11.2* 12.2  HCT 33.1* 35.7*  PLT 375 363  APTT -- --  INR -- --     Basename 04/01/12 0548 03/31/12 0503 03/30/12 0432  NA 128* 129* 131*  K 4.7 4.2 3.9  CL 93* 94* 97  CO2 26 25 28   GLUCOSE 127* 145* 107*  BUN 15 11 7   CREATININE 0.56 0.58 0.60  LABCREA -- -- --  CREAT24HRUR -- -- --  CALCIUM 9.5 9.0 8.5  MG 1.8 -- 1.6  PHOS 3.3 -- 2.7  PROT 5.5* 6.0 5.1*  ALBUMIN 2.5* 2.6* 2.2*  AST 21 27 21   ALT 15 15 10   ALKPHOS 98 116 99  BILITOT 0.1* 0.3 0.2*  BILIDIR -- -- --  IBILI -- -- --  PREALBUMIN -- -- --  TRIG -- -- --  CHOLHDL -- -- --  CHOL -- -- --   Estimated Creatinine Clearance: 46.1 ml/min (by C-G formula based on Cr of 0.56).    Basename 04/01/12 0637 03/31/12 2353 03/31/12 1741  GLUCAP 121* 159* 159*   CBGs & Insulin requirements past 24 hours:   CBGs 121-173, received 10 units of sensitive SSI.  Nutritional Goals:   9/4 RD recs: Kcal 1650-1850, Protein: 65-75g, Fluid > 1.7L  Clinimix E 5/20 @ 60 ml/hr + IVF 20% on MWF will provide 72g protein/day, 1746 Kcal on MWF and 1267 Kcal on other days for an average of 1473 Kcal per day of the week.  Current nutrition:    Clinimix E 5/20 at 12ml/hr + fat emulsion at 104ml/hr  Diet: NPO  IVF:  NS + 30 mEq/L KCl @ 40 ml/hr  Assessment:   78 yo F with rectal mass, s/p sigmoidoscopy 8/19 with pathology showing moderate to poorly differentiated adenocarcinoma, c/w a GYN primary.  Patient developed small bowel obstruction 2/2 neoplasm and without PO intake x 1 week.  TNA started 9/3. Plan for GYN oncology and surgery consult.   No history of DM - CBGs ok since starting TNA    NG replaced 9/6 d/t recurrent N/V, abdominal distention.    Plan is to be determined - surgery vs. beginning chemotherapy.  Renal/hepatic function: wnl   Electrolytes:  Na slightly low (unable to correct in TNA).  Corrected Ca wnl  Pre-Albumin: 6.5(9/4)  TG/Cholesterol: wnl(9/4)  Plan:    Continue Clinimix E 5/20 at 60 ml/hr.  TNA to contain IV fat emulsion, standard multivitamins and trace elements only on MWF only due to ongoing shortage.  Continue sensitive SSI q6h.  TNA labs Monday/Thursdays  Bmet in am.  Charolotte Eke, PharmD, pager [phone]. 04/01/2012,7:13 AM.

## 2012-04-01 NOTE — Progress Notes (Signed)
  Subjective: Feels good. Less distended. Very upbeat  Objective: Vital signs in last 24 hours: Temp:  [97.5 F (36.4 C)-97.6 F (36.4 C)] 97.5 F (36.4 C) (09/07 0639) Pulse Rate:  [77-116] 77  (09/07 0639) Resp:  [18] 18  (09/07 0639) BP: (111-128)/(69-86) 123/75 mmHg (09/07 0639) SpO2:  [92 %-94 %] 93 % (09/07 0639) Last BM Date: 03/30/12  Intake/Output from previous day: 2023-04-28 0701 - 09/07 0700 In: 0  Out: 3150 [Urine:250; Emesis/NG output:2900] Intake/Output this shift:    GI: soft, mild distension. nontender  Lab Results:   Basename 04/01/12 0548 03/30/12 1110  WBC 16.0* 11.9*  HGB 11.2* 12.2  HCT 33.1* 35.7*  PLT 375 363   BMET  Basename 04/01/12 0548 2012/04/27 0503  NA 128* 129*  K 4.7 4.2  CL 93* 94*  CO2 26 25  GLUCOSE 127* 145*  BUN 15 11  CREATININE 0.56 0.58  CALCIUM 9.5 9.0   PT/INR No results found for this basename: LABPROT:2,INR:2 in the last 72 hours ABG No results found for this basename: PHART:2,PCO2:2,PO2:2,HCO3:2 in the last 72 hours  Studies/Results: Dg Abd 2 Views  04/27/2012  *RADIOLOGY REPORT*  Clinical Data: Distended abdomen.  Evaluate for recurrent small bowel obstruction.  ABDOMEN - 2 VIEW  Comparison: Nine 09/03/2011.  Findings: A nasogastric tube is seen extending into the stomach. There is a paucity of distal colonic gas.  Multiple dilated loops of gas-filled small bowel are noted in the central abdomen and left side of the abdomen, measuring up to 5.1 cm in diameter.  Air fluid levels are present on the left lateral decubitus view.  No gross evidence of pneumoperitoneum.  IMPRESSION: 1.  Findings, as above, compatible with persistent small bowel obstruction. 2.  No pneumoperitoneum.   Original Report Authenticated By: Florencia Reasons, M.D.     Anti-infectives: Anti-infectives    None      Assessment/Plan: s/p * No surgery found * Continue npo and tpn May need exploration jointly by GYN and Gen surg in the next week or  two  LOS: 8 days    TOTH III,Shirely Toren S 04/01/2012

## 2012-04-02 DIAGNOSIS — I4891 Unspecified atrial fibrillation: Secondary | ICD-10-CM

## 2012-04-02 DIAGNOSIS — E876 Hypokalemia: Secondary | ICD-10-CM

## 2012-04-02 DIAGNOSIS — K56609 Unspecified intestinal obstruction, unspecified as to partial versus complete obstruction: Secondary | ICD-10-CM

## 2012-04-02 DIAGNOSIS — C20 Malignant neoplasm of rectum: Secondary | ICD-10-CM

## 2012-04-02 LAB — GLUCOSE, CAPILLARY
Glucose-Capillary: 116 mg/dL — ABNORMAL HIGH (ref 70–99)
Glucose-Capillary: 118 mg/dL — ABNORMAL HIGH (ref 70–99)

## 2012-04-02 LAB — COMPREHENSIVE METABOLIC PANEL
ALT: 18 U/L (ref 0–35)
Alkaline Phosphatase: 96 U/L (ref 39–117)
Chloride: 93 mEq/L — ABNORMAL LOW (ref 96–112)
GFR calc Af Amer: >90 mL/min (ref >90)
Glucose, Bld: 110 mg/dL — ABNORMAL HIGH (ref 70–99)
Potassium: 4.4 mEq/L (ref 3.5–5.1)
Sodium: 128 mEq/L — ABNORMAL LOW (ref 135–145)
Total Protein: 5.3 g/dL — ABNORMAL LOW (ref 6.0–8.3)

## 2012-04-02 MED ORDER — INSULIN ASPART 100 UNIT/ML ~~LOC~~ SOLN
0.0000 [IU] | Freq: Three times a day (TID) | SUBCUTANEOUS | Status: DC
  Administered 2012-04-03: 1 [IU] via SUBCUTANEOUS
  Administered 2012-04-04: 3 [IU] via SUBCUTANEOUS
  Administered 2012-04-04: 1 [IU] via SUBCUTANEOUS
  Administered 2012-04-05: 3 [IU] via SUBCUTANEOUS
  Administered 2012-04-05 – 2012-04-06 (×2): 1 [IU] via SUBCUTANEOUS

## 2012-04-02 MED ORDER — ALTEPLASE 2 MG IJ SOLR
2.0000 mg | Freq: Once | INTRAMUSCULAR | Status: AC
  Administered 2012-04-02: 2 mg
  Filled 2012-04-02: qty 2

## 2012-04-02 MED ORDER — CLINIMIX E/DEXTROSE (5/20) 5 % IV SOLN
INTRAVENOUS | Status: AC
  Administered 2012-04-02: 18:00:00 via INTRAVENOUS
  Filled 2012-04-02: qty 2000

## 2012-04-02 NOTE — Progress Notes (Signed)
  Subjective: No complaints. Feels great. Less distended  Objective: Vital signs in last 24 hours: Temp:  [97.6 F (36.4 C)-98.3 F (36.8 C)] 97.6 F (36.4 C) (09/08 0629) Pulse Rate:  [70-72] 70  (09/08 0629) Resp:  [16-18] 18  (09/08 0629) BP: (107-121)/(68-74) 110/70 mmHg (09/08 0629) SpO2:  [92 %-96 %] 94 % (09/08 0629) Last BM Date: 03/30/12  Intake/Output from previous day: April 03, 2023 0701 - 09/08 0700 In: 2565.9 [I.V.:1820.9; TPN:745] Out: 3025 [Urine:1925; Emesis/NG output:1100] Intake/Output this shift: Total I/O In: 0  Out: 250 [Urine:250]  GI: soft, nontender  Lab Results:   Basename 02-Apr-2012 0548 03/30/12 1110  WBC 16.0* 11.9*  HGB 11.2* 12.2  HCT 33.1* 35.7*  PLT 375 363   BMET  Basename 04/02/12 0615 02-Apr-2012 0548  NA 128* 128*  K 4.4 4.7  CL 93* 93*  CO2 29 26  GLUCOSE 110* 127*  BUN 17 15  CREATININE 0.62 0.56  CALCIUM 9.1 9.5   PT/INR No results found for this basename: LABPROT:2,INR:2 in the last 72 hours ABG No results found for this basename: PHART:2,PCO2:2,PO2:2,HCO3:2 in the last 72 hours  Studies/Results: Dg Abd 2 Views  2012-04-02  *RADIOLOGY REPORT*  Clinical Data: Follow up small bowel obstruction  ABDOMEN - 2 VIEW  Comparison: Prior radiograph yesterday, 03/31/2012  Findings: Nasogastric tube remains in good position.  The tip projects over the gastric body.  Interval decompression of the stomach.  Persistent gaseous dilatation of loops of small bowel in the left hemiabdomen.  Small bowel measures up to 5.3 cm in the left upper quadrant.  No evidence of free air.  Mild bibasilar atelectasis.  Incompletely imaged pacemaker leads project over the right atrium right ventricle.  Multilevel degenerative disc disease with rotational levoconvex scoliosis centered at L3.  Numerous vascular flow voids project over the anatomic pelvis.  IMPRESSION:  1.  Stable position of nasogastric tube with successful decompression of the stomach. 2.  Persistent  dilated and gas filled loops of small bowel in the left hemiabdomen measuring up to 5.3 cm in diameter in the left upper quadrant.   Original Report Authenticated By: Alvino Blood Abd 2 Views  03/31/2012  *RADIOLOGY REPORT*  Clinical Data: Distended abdomen.  Evaluate for recurrent small bowel obstruction.  ABDOMEN - 2 VIEW  Comparison: Nine 09/03/2011.  Findings: A nasogastric tube is seen extending into the stomach. There is a paucity of distal colonic gas.  Multiple dilated loops of gas-filled small bowel are noted in the central abdomen and left side of the abdomen, measuring up to 5.1 cm in diameter.  Air fluid levels are present on the left lateral decubitus view.  No gross evidence of pneumoperitoneum.  IMPRESSION: 1.  Findings, as above, compatible with persistent small bowel obstruction. 2.  No pneumoperitoneum.   Original Report Authenticated By: Florencia Reasons, M.D.     Anti-infectives: Anti-infectives    None      Assessment/Plan: s/p * No surgery found * Continue ng, bowel rest, and tpn Await gyn onc and onc input  LOS: 9 days    TOTH III,Evalie Hargraves S 04/02/2012

## 2012-04-02 NOTE — Progress Notes (Signed)
PARENTERAL NUTRITION CONSULT NOTE - Follow Up  Pharmacy Consult for TNA  Indication: SBO 2/2 neoplasm  Allergies  Allergen Reactions  . Pindolol Swelling    Throat swelled up  . Iodine   . Shellfish Allergy   . Vibramycin (Doxycycline Calcium) Nausea Only  . Doxycycline Nausea Only   Patient Measurements: Height: 5\' 1"  (154.9 cm) Weight: 139 lb (63.05 kg) IBW/kg (Calculated) : 47.8   Vital Signs: Temp: 97.6 F (36.4 C) (09/08 0629) Temp src: Oral (09/08 0629) BP: 110/70 mmHg (09/08 0629) Pulse Rate: 70  (09/08 0629) Intake/Output from previous day: 09/07 0701 - 09/08 0700 In: 2565.9 [I.V.:1820.9; TPN:745] Out: 3025 [Urine:1925; Emesis/NG output:1100] Intake/Output from this shift:    Labs:  [facility] 04/01/12 0548 03/30/12 1110  WBC 16.0* 11.9*  HGB 11.2* 12.2  HCT 33.1* 35.7*  PLT 375 363  APTT -- --  INR -- --     Basename 04/02/12 0615 04/01/12 0548 03/31/12 0503  NA 128* 128* 129*  K 4.4 4.7 4.2  CL 93* 93* 94*  CO2 29 26 25   GLUCOSE 110* 127* 145*  BUN 17 15 11   CREATININE 0.62 0.56 0.58  LABCREA -- -- --  CREAT24HRUR -- -- --  CALCIUM 9.1 9.5 9.0  MG -- 1.8 --  PHOS -- 3.3 --  PROT 5.3* 5.5* 6.0  ALBUMIN 2.5* 2.5* 2.6*  AST 25 21 27   ALT 18 15 15   ALKPHOS 96 98 116  BILITOT 0.2* 0.1* 0.3  BILIDIR -- -- --  IBILI -- -- --  PREALBUMIN -- -- --  TRIG -- -- --  CHOLHDL -- -- --  CHOL -- -- --   Estimated Creatinine Clearance: 45.3 ml/min (by C-G formula based on Cr of 0.62).    Basename 04/02/12 0531 04/02/12 0009 04/01/12 1753  GLUCAP 118* 116* 110*   CBGs & Insulin requirements past 24 hours:   CBGs 121-173, received 1 unit of sensitive SSI.  Nutritional Goals:   9/4 RD recs: Kcal 1650-1850, Protein: 65-75g, Fluid > 1.7L  Clinimix E 5/20 @ 60 ml/hr + IVF 20% on MWF will provide 72g protein/day, 1746 Kcal on MWF and 1267 Kcal on other days for an average of 1473 Kcal per day of the week.  Current nutrition:   Clinimix E 5/20 at  48ml/hr + fat emulsion at 56ml/hr  Diet: NPO  IVF:  NS + 30 mEq/L KCl @ 40 ml/hr  Assessment:   78 yo F with rectal mass, s/p sigmoidoscopy 8/19 with pathology showing moderate to poorly differentiated adenocarcinoma, c/w a GYN primary.  Patient developed small bowel obstruction 2/2 neoplasm and without PO intake x 1 week.  TNA started 9/3. Plan for GYN oncology and surgery consult.   No history of DM - CBGs ok since starting TNA    NG replaced 9/6, out yesterday.   Plan is to be determined - surgery vs. beginning chemotherapy.  Renal/hepatic function: wnl   Electrolytes:  Na remains slightly low(unable to correct in TNA).  Corrected Ca wnl  Pre-Albumin: 6.5(9/4)  TG/Cholesterol: wnl(9/4)  Plan:    Continue Clinimix E 5/20 at 60 ml/hr.  TNA to contain IV fat emulsion, standard multivitamins and trace elements only on MWF only due to ongoing shortage.  Liberalize sensitive SSI to q8h.  TNA labs Monday/Thursdays.  Charolotte Eke, PharmD, pager [phone]. 04/02/2012,8:01 AM.

## 2012-04-02 NOTE — Progress Notes (Signed)
IP PROGRESS NOTE  Subjective:  She is feeling well this am. NG tube in place. No nausea or vomiting.  No chest pain or palpitation.   Objective: Vital signs in last 24 hours: Blood pressure 110/70, pulse 70, temperature 97.6 F (36.4 C), temperature source Oral, resp. rate 18, height 5\' 1"  (1.549 m), weight 139 lb (63.05 kg), SpO2 94.00%.  Intake/Output from previous day: 09/07 0701 - 09/08 0700 In: 1125.9 [I.V.:380.9; TPN:745] Out: 2625 [Urine:1925; Emesis/NG output:700]  Physical Exam: Lungs: Decreased breath sounds at the right greater than left lower chest, no respiratory distress, inspiratory rhonchi at the left base Cardiac: Regular rate and rhythm, tachycardia Abdomen: Distended, bowel sounds are present Extremities: No leg edema     Lab Results:  Basename 04/01/12 0548 03/30/12 1110  WBC 16.0* 11.9*  HGB 11.2* 12.2  HCT 33.1* 35.7*  PLT 375 363    BMET  Basename 04/01/12 0548 03/31/12 0503  NA 128* 129*  K 4.7 4.2  CL 93* 94*  CO2 26 25  GLUCOSE 127* 145*  BUN 15 11  CREATININE 0.56 0.58  CALCIUM 9.5 9.0    Medications: I have reviewed the patient's current medications.  Assessment/Plan:  1. 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 A staging CT scan 03/02/2012 confirmed a mass at the right aspect of the rectum with nodal masses in the abdomen/pelvis. Elevated CA 125. Normal CEA  2. Small bowel obstruction secondary to #1-the NG tube was placed again on 9/6. Doing well today. No vomiting. On TNA.  3. Atrial fibrillation/flutter-medical therapy per cardiology-she appears to have a regular rhythm at present. On Cardizem drip.   4. Anticoagulation-Coumadin on hold She is on prophylactic dose Lovenox.  5. Hypokalemia-the potassium is now in the normal range  6. Nutrition-TNA started on  03/28/2012   The plan is to continue supportive care with the NG tube, medical therapy for atrial fibrillation/atrial flutter, and TNA. Surgical intervention versus beginning Taxol/carboplatin chemotherapy will be decided over the next few days.    LOS: 9 days   Roann Merk  04/02/2012, 7:35 AM

## 2012-04-03 ENCOUNTER — Inpatient Hospital Stay (HOSPITAL_COMMUNITY): Payer: Medicare Other

## 2012-04-03 LAB — CBC
Hemoglobin: 11.3 g/dL — ABNORMAL LOW (ref 12.0–15.0)
MCH: 28.8 pg (ref 26.0–34.0)
MCV: 86 fL (ref 78.0–100.0)
RBC: 3.93 MIL/uL (ref 3.87–5.11)

## 2012-04-03 LAB — COMPREHENSIVE METABOLIC PANEL
ALT: 24 U/L (ref 0–35)
Albumin: 2.4 g/dL — ABNORMAL LOW (ref 3.5–5.2)
Alkaline Phosphatase: 112 U/L (ref 39–117)
Glucose, Bld: 105 mg/dL — ABNORMAL HIGH (ref 70–99)
Potassium: 4.3 mEq/L (ref 3.5–5.1)
Sodium: 126 mEq/L — ABNORMAL LOW (ref 135–145)
Total Protein: 5.2 g/dL — ABNORMAL LOW (ref 6.0–8.3)

## 2012-04-03 LAB — DIFFERENTIAL
Eosinophils Absolute: 0 10*3/uL (ref 0.0–0.7)
Eosinophils Relative: 0 % (ref 0–5)
Lymphs Abs: 0.9 10*3/uL (ref 0.7–4.0)
Monocytes Relative: 11 % (ref 3–12)

## 2012-04-03 LAB — GLUCOSE, CAPILLARY: Glucose-Capillary: 134 mg/dL — ABNORMAL HIGH (ref 70–99)

## 2012-04-03 MED ORDER — ZINC TRACE METAL 1 MG/ML IV SOLN
INTRAVENOUS | Status: AC
  Administered 2012-04-03: 18:00:00 via INTRAVENOUS
  Filled 2012-04-03: qty 2000

## 2012-04-03 MED ORDER — IOHEXOL 300 MG/ML  SOLN
100.0000 mL | Freq: Once | INTRAMUSCULAR | Status: AC | PRN
  Administered 2012-04-03: 100 mL via INTRAVENOUS

## 2012-04-03 MED ORDER — FAT EMULSION 20 % IV EMUL
240.0000 mL | INTRAVENOUS | Status: AC
  Administered 2012-04-03: 240 mL via INTRAVENOUS
  Filled 2012-04-03: qty 250

## 2012-04-03 NOTE — Progress Notes (Signed)
IP PROGRESS NOTE  Subjective:  No nausea or pain with the NG tube in place. Minimal flatus.   Objective: Vital signs in last 24 hours: Blood pressure 108/70, pulse 75, temperature 98.2 F (36.8 C), temperature source Oral, resp. rate 16, height 5\' 1"  (1.549 m), weight 139 lb (63.05 kg), SpO2 96.00%.  Intake/Output from previous day: 09/08 0701 - 09/09 0700 In: 2789.8 [I.V.:1319.8; NG/GT:30; TPN:1440] Out: 3025 [Urine:2225; Emesis/NG output:800]  Physical Exam: Lungs: Decreased breath sounds at the right greater than left lower chest, no respiratory distress, inspiratory rhonchi at the left base Cardiac: Regular rate and rhythm Abdomen: Soft and nontender, bowel sounds are present Extremities: No leg edema     Lab Results:  Basename 04/03/12 0505 04/01/12 0548  WBC 14.6* 16.0*  HGB 11.3* 11.2*  HCT 33.8* 33.1*  PLT 335 375    BMET  Basename 04/03/12 0505 04/02/12 0615  NA 126* 128*  K 4.3 4.4  CL 92* 93*  CO2 26 29  GLUCOSE 105* 110*  BUN 19 17  CREATININE 0.60 0.62  CALCIUM 8.6 9.1     Medications: I have reviewed the patient's current medications.  Assessment/Plan:  1. 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 A staging CT scan 03/02/2012 confirmed a mass at the right aspect of the rectum with nodal masses in the abdomen/pelvis. Elevated CA 125. Normal CEA  2. Small bowel obstruction secondary to #1-the NG tube was removed on 03/29/2012. Recurrent obstructive symptoms on 03/31/2012, an NG tube remains in place.  3. atrial fibrillation/flutter-medical therapy per cardiology-she appears to have a regular rhythm at present. I discussed the case with Dr. Royann Shivers. He will continue medical therapy for rate control. No plan for cardioversion at present.  4. Anticoagulation-Coumadin on hold, she received low-dose  vitamin K in the p.m. on 03/24/2012. She has been on IV heparin during this hospital admission. I discussed the risk/benefit of continuing anticoagulation therapy with cardiology. Heparin was discontinued. She will begin aspirin prophylaxis when taking by mouth. Now on prophylactic dose Lovenox.  5. Hypokalemia-the potassium is now in the normal range  6. Nutrition-TNA started on 03/28/2012  The NG tube remains in place. I discussed the case with Dr. Dwain Sarna. He will evaluate Victoria [name] to decide on the indication for an exploratory laparotomy. Chemotherapy remains on hold. We can proceed with chemotherapy if she does not have surgery or if surgery is only performed for placement of a gastrostomy tube.    LOS: 10 days   Deward Sebek  04/03/2012, 11:22 AM

## 2012-04-03 NOTE — Progress Notes (Signed)
I think that an attempt at surgery is indicated at this point.  She has not really gotten better with NG tube decompression. I think the option of just placing a gastrostomy in her is reasonable but naturally to leave her as she is right now I do not think chemotherapy is going to help with her obstruction. I am also concerned about this area in the rectum. I reviewed all of her data and examined her today. I do think that proceeding to the operating room with an attempt at relieving her obstruction is reasonable. I discussed with her that I may not be able to do that and I may closer without doing anything or just placing a gastrostomy tube. I discussed a palliative bypass versus a possible resection with anastomosis. I also discussed a colostomy if she has this tumor growing and direct him in her pelvis. She is agreeable to this. We discussed the risks including bleeding, infection, DVT, PE, stroke, arrhythmia, marker for shrink, inability to fix any of her problems, and death. She understands we'll proceed tomorrow. 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

## 2012-04-03 NOTE — Progress Notes (Signed)
Subjective: Stated she was tired of NG, appears more fatigued than last week also.   Objective: Vital signs in last 24 hours: Temp:  [97.4 F (36.3 C)-98.2 F (36.8 C)] 97.4 F (36.3 C) (09/09 1408) Pulse Rate:  [70-75] 71  (09/09 1408) Resp:  [16-18] 18  (09/09 1408) BP: (103-134)/(64-92) 134/92 mmHg (09/09 1408) SpO2:  [94 %-96 %] 94 % (09/09 1408) Weight change:  Last BM Date: 03/30/12 Intake/Output from previous day: -675 09/08 0701 - 09/09 0700 In: 2789.8 [I.V.:1319.8; NG/GT:30; TPN:1440] Out: 3025 [Urine:2225; Emesis/NG output:800] Intake/Output this shift: Total I/O In: 0  Out: 901 [Urine:900; Stool:1]  PE: General:Alert and oriented, pleasant upbeat attitude but more weary in appearance. Heart:S1S2 RRR though underlying A. Flutter continues rate controlled at 70 Lungs:clear without rales,rhonchi or wheezes  WUJ:WJXBJYN BS, lg amt NG drainage.    Ext:no edema Neuro:alert and oriented   Lab Results:  Basename 04/03/12 0505 04/01/12 0548  WBC 14.6* 16.0*  HGB 11.3* 11.2*  HCT 33.8* 33.1*  PLT 335 375   BMET  Basename 04/03/12 0505 04/02/12 0615  NA 126* 128*  K 4.3 4.4  CL 92* 93*  CO2 26 29  GLUCOSE 105* 110*  BUN 19 17  CREATININE 0.60 0.62  CALCIUM 8.6 9.1   No results found for this basename: TROPONINI:2,CK,MB:2 in the last 72 hours  Lab Results  Component Value Date   CHOL 93 04/03/2012   HDL 43 05/15/2008   LDLCALC  Value: 82        Total Cholesterol/HDL:CHD Risk Coronary Heart Disease Risk Table                     Men   Women  1/2 Average Risk   3.4   3.3 05/15/2008   TRIG 118 04/03/2012   CHOLHDL 3.3 05/15/2008    Hepatic Function Panel  Basename 04/03/12 0505  PROT 5.2*  ALBUMIN 2.4*  AST 25  ALT 24  ALKPHOS 112  BILITOT 0.3  BILIDIR --  IBILI --    Basename 04/03/12 0505  CHOL 93   EKG: Orders placed during the hospital encounter of 03/24/12  . EKG 12-LEAD  . EKG 12-LEAD  . EKG 12-LEAD  . EKG 12-LEAD  . EKG 12-LEAD  . EKG  12-LEAD  . EKG 12-LEAD  . EKG 12-LEAD  . EKG 12-LEAD  . EKG 12-LEAD    Studies/Results: No results found.  Medications: I have reviewed the patient's current medications.    Marland Kitchen alteplase  2 mg Intracatheter Once  . enoxaparin  40 mg Subcutaneous Q24H  . insulin aspart  0-9 Units Subcutaneous Q8H  . metoprolol  2.5 mg Intravenous Q6H  Assessment/Plan: Principal Problem:  *Small bowel obstruction Active Problems:  Rectal carcinoma  Atrial fibrillation  Chronic anticoagulation  CAD (coronary artery disease)  S/P placement of cardiac pacemaker  Hyponatremia  Pelvic mass in female, necrotic  PLAN: To Or in AM for expl. Lap and possible relief of obstruction.  See Dr. Doreen Salvage note. Now on VTE proph. With Lovenox.  Continues in A. Fib rate controlled.  VS stable. Hyponatremic. Cardizem continues at 15.  LOS: 10 days   INGOLD,LAURA R 04/03/2012, 4:20 PM   Agree with note written by Nada Boozer RNP  For Surgery tomorrow to attempt to decompress and address obstruction. AF with CVR. Lovenox for DVT prophylaxis. Will continue to follow.  Runell Gess 04/03/2012 5:38 PM

## 2012-04-03 NOTE — Progress Notes (Signed)
04/03/2012 Raynelle Bring BSN CCM [phone] CM spoke with patient and grand daughter. Pt is from home and plans to return to home in [place] where she will have support of her spouse, daughter, and grand daughter. Pt was independent prior to hospital admission. Plans are for surgery in AM-exploratory laP-cm WILL FOLLOW POST OP.

## 2012-04-03 NOTE — Progress Notes (Signed)
PARENTERAL NUTRITION CONSULT NOTE - Follow Up  Pharmacy Consult for TNA  Indication: SBO 2/2 neoplasm  Allergies  Allergen Reactions  . Pindolol Swelling    Throat swelled up  . Iodine   . Shellfish Allergy   . Vibramycin (Doxycycline Calcium) Nausea Only  . Doxycycline Nausea Only   Patient Measurements: Height: 5\' 1"  (154.9 cm) Weight: 139 lb (63.05 kg) IBW/kg (Calculated) : 47.8   Vital Signs: Temp: 98.2 F (36.8 C) (09/09 0600) Temp src: Oral (09/09 0600) BP: 108/70 mmHg (09/09 0600) Pulse Rate: 75  (09/09 0600) Intake/Output from previous day: 09/08 0701 - 09/09 0700 In: 2789.8 [I.V.:1319.8; NG/GT:30; TPN:1440] Out: 3025 [Urine:2225; Emesis/NG output:800] Intake/Output from this shift:    Labs:  [facility] 04/03/12 0505 04/01/12 0548  WBC 14.6* 16.0*  HGB 11.3* 11.2*  HCT 33.8* 33.1*  PLT 335 375  APTT -- --  INR -- --     Basename 04/03/12 0505 04/02/12 0615 04/01/12 0548  NA 126* 128* 128*  K 4.3 4.4 4.7  CL 92* 93* 93*  CO2 26 29 26   GLUCOSE 105* 110* 127*  BUN 19 17 15   CREATININE 0.60 0.62 0.56  LABCREA -- -- --  CREAT24HRUR -- -- --  CALCIUM 8.6 9.1 9.5  MG 2.0 -- 1.8  PHOS 3.3 -- 3.3  PROT 5.2* 5.3* 5.5*  ALBUMIN 2.4* 2.5* 2.5*  AST 25 25 21   ALT 24 18 15   ALKPHOS 112 96 98  BILITOT 0.3 0.2* 0.1*  BILIDIR -- -- --  IBILI -- -- --  PREALBUMIN -- -- --  TRIG 118 -- --  CHOLHDL -- -- --  CHOL 93 -- --   Estimated Creatinine Clearance: 45.3 ml/min (by C-G formula based on Cr of 0.6).    Basename 04/03/12 0049 04/02/12 1731 04/02/12 1225  GLUCAP 134* 113* 113*   CBGs & Insulin requirements past 24 hours:   CBGs 113-134, received 0 units of sensitive SSI.  Nutritional Goals:   9/4 RD recs: Kcal 1650-1850, Protein: 65-75g, Fluid > 1.7L  Clinimix E 5/20 @ 60 ml/hr + IVF 20% on MWF will provide 72g protein/day, 1746 Kcal on MWF and 1267 Kcal on other days for an average of 1473 Kcal per day of the week.  Current nutrition:    Clinimix E 5/20 at 41ml/hr + fat emulsion at 64ml/hr on MWF  Diet: NPO except ice chips  IVF:  NS + 30 mEq/L KCl @ 40 ml/hr  Assessment:   78 yo F with rectal mass, s/p sigmoidoscopy 8/19 with pathology showing moderate to poorly differentiated adenocarcinoma, c/w a GYN primary.  Patient developed small bowel obstruction 2/2 neoplasm and without PO intake x 1 week.  TNA started 9/3. Plan for GYN oncology and surgery consult.   No history of DM - recent CGBs <150   NG replaced 9/6, out yesterday.   Plan is to be determined - surgery vs. beginning chemotherapy.  Renal/hepatic function: wnl   Electrolytes:  Na remains low(unable to correct in TNA - premixed, commercially prepared product).    Pre-Albumin: 6.5(9/4)  TG/Cholesterol: wnl(9/9)  Plan:    Continue Clinimix E 5/20 at 60 ml/hr.  TNA to contain IV fat emulsion, standard multivitamins and trace elements only on MWF only due to ongoing shortage.  Continue sensitive SSI to q8h.  TNA labs Monday/Thursdays.  Darrol Angel, PharmD Pager: [phone] 04/03/2012 8:20 AM

## 2012-04-03 NOTE — Progress Notes (Signed)
Patient ID: Victoria [name], female   DOB: Nov 17, 1926, 78 y.o.   MRN: [id number]    Subjective: Pt feels much better with NGT in place.  Had small amount of flatus this morning.  Objective: Vital signs in last 24 hours: Temp:  [97.3 F (36.3 C)-98.2 F (36.8 C)] 98.2 F (36.8 C) (09/09 0600) Pulse Rate:  [70-75] 75  (09/09 0600) Resp:  [16-17] 16  (09/09 0600) BP: (99-112)/(61-72) 108/70 mmHg (09/09 0600) SpO2:  [95 %-96 %] 96 % (09/09 0600) Last BM Date: 03/30/12  Intake/Output from previous day: 09/08 0701 - 09/09 0700 In: 2789.8 [I.V.:1319.8; NG/GT:30; TPN:1440] Out: 3025 [Urine:2225; Emesis/NG output:800] Intake/Output this shift: Total I/O In: 0  Out: 200 [Urine:200]  PE: Abd: soft, few BS, NGT with bilious output.  NT Ht: regular Lungs: CTAB  Lab Results:   Basename 04/03/12 0505 04/01/12 0548  WBC 14.6* 16.0*  HGB 11.3* 11.2*  HCT 33.8* 33.1*  PLT 335 375   BMET  Basename 04/03/12 0505 04/02/12 0615  NA 126* 128*  K 4.3 4.4  CL 92* 93*  CO2 26 29  GLUCOSE 105* 110*  BUN 19 17  CREATININE 0.60 0.62  CALCIUM 8.6 9.1   PT/INR No results found for this basename: LABPROT:2,INR:2 in the last 72 hours CMP     Component Value Date/Time   NA 126* 04/03/2012 0505   K 4.3 04/03/2012 0505   CL 92* 04/03/2012 0505   CO2 26 04/03/2012 0505   GLUCOSE 105* 04/03/2012 0505   BUN 19 04/03/2012 0505   CREATININE 0.60 04/03/2012 0505   CALCIUM 8.6 04/03/2012 0505   PROT 5.2* 04/03/2012 0505   ALBUMIN 2.4* 04/03/2012 0505   AST 25 04/03/2012 0505   ALT 24 04/03/2012 0505   ALKPHOS 112 04/03/2012 0505   BILITOT 0.3 04/03/2012 0505   GFRNONAA 82* 04/03/2012 0505   GFRAA >90 04/03/2012 0505   Lipase  No results found for this basename: lipase       Studies/Results: No results found.  Anti-infectives: Anti-infectives    None       Assessment/Plan  1. Pelvic mass, likely GYN source 2. Rectal mass, possibly extrinsic from pelvic source  3. SBO, likely malignant  Plan: 1. Dr.  Dwain [name] to evaluate the patient.  She will likely need ex lap with SB bypass or resection of area of SBO with ileostomy given colonic obstruction as well, +/- placement of G-tube at that time.  This would be for palliation and not for cure.  Dr. Dwain [name] to make final decision after discussion with the patient and family. 2. Cont NGT for decompression currently.  LOS: 10 days    Victoria [name] E 04/03/2012

## 2012-04-04 ENCOUNTER — Other Ambulatory Visit: Payer: Self-pay | Admitting: Oncology

## 2012-04-04 ENCOUNTER — Encounter (HOSPITAL_COMMUNITY): Payer: Self-pay | Admitting: Anesthesiology

## 2012-04-04 ENCOUNTER — Inpatient Hospital Stay (HOSPITAL_COMMUNITY): Payer: Medicare Other | Admitting: Anesthesiology

## 2012-04-04 ENCOUNTER — Encounter (HOSPITAL_COMMUNITY)
Admission: AD | Disposition: A | Discharge status: Home Health | Payer: Self-pay | Source: Ambulatory Visit | Attending: Oncology

## 2012-04-04 DIAGNOSIS — C801 Malignant (primary) neoplasm, unspecified: Secondary | ICD-10-CM | POA: Diagnosis not present

## 2012-04-04 DIAGNOSIS — C569 Malignant neoplasm of unspecified ovary: Secondary | ICD-10-CM

## 2012-04-04 DIAGNOSIS — E871 Hypo-osmolality and hyponatremia: Secondary | ICD-10-CM | POA: Diagnosis not present

## 2012-04-04 DIAGNOSIS — K5669 Other intestinal obstruction: Secondary | ICD-10-CM | POA: Diagnosis not present

## 2012-04-04 DIAGNOSIS — E46 Unspecified protein-calorie malnutrition: Secondary | ICD-10-CM | POA: Diagnosis not present

## 2012-04-04 HISTORY — PX: COLON RESECTION: SHX5231

## 2012-04-04 LAB — GLUCOSE, CAPILLARY
Glucose-Capillary: 131 mg/dL — ABNORMAL HIGH (ref 70–99)
Glucose-Capillary: 131 mg/dL — ABNORMAL HIGH (ref 70–99)
Glucose-Capillary: 175 mg/dL — ABNORMAL HIGH (ref 70–99)
Glucose-Capillary: 203 mg/dL — ABNORMAL HIGH (ref 70–99)

## 2012-04-04 LAB — ABO/RH: ABO/RH(D): O NEG

## 2012-04-04 LAB — MRSA PCR SCREENING: MRSA by PCR: NEGATIVE

## 2012-04-04 SURGERY — COLON RESECTION
Anesthesia: General | Site: Abdomen | Wound class: Clean Contaminated

## 2012-04-04 MED ORDER — FENTANYL CITRATE 0.05 MG/ML IJ SOLN
INTRAMUSCULAR | Status: DC | PRN
  Administered 2012-04-04 (×3): 25 ug via INTRAVENOUS

## 2012-04-04 MED ORDER — MEPERIDINE HCL 50 MG/ML IJ SOLN: 6.2500 mg | INTRAMUSCULAR | Status: DC | PRN

## 2012-04-04 MED ORDER — PHENYLEPHRINE HCL 10 MG/ML IJ SOLN
INTRAMUSCULAR | Status: DC | PRN
  Administered 2012-04-04: 120 ug via INTRAVENOUS
  Administered 2012-04-04 (×3): 80 ug via INTRAVENOUS

## 2012-04-04 MED ORDER — ONDANSETRON HCL 4 MG/2ML IJ SOLN: 4.0000 mg | Freq: Four times a day (QID) | INTRAMUSCULAR | Status: DC | PRN

## 2012-04-04 MED ORDER — NALOXONE HCL 0.4 MG/ML IJ SOLN: 0.4000 mg | INTRAMUSCULAR | Status: DC | PRN

## 2012-04-04 MED ORDER — NEOSTIGMINE METHYLSULFATE 1 MG/ML IJ SOLN
INTRAMUSCULAR | Status: DC | PRN
  Administered 2012-04-04: 4 mg via INTRAVENOUS

## 2012-04-04 MED ORDER — GLYCOPYRROLATE 0.2 MG/ML IJ SOLN
INTRAMUSCULAR | Status: DC | PRN
  Administered 2012-04-04: .5 mg via INTRAVENOUS

## 2012-04-04 MED ORDER — ROCURONIUM BROMIDE 100 MG/10ML IV SOLN
INTRAVENOUS | Status: DC | PRN
  Administered 2012-04-04: 20 mg via INTRAVENOUS
  Administered 2012-04-04: 30 mg via INTRAVENOUS

## 2012-04-04 MED ORDER — CLINIMIX E/DEXTROSE (5/20) 5 % IV SOLN
INTRAVENOUS | Status: AC
  Administered 2012-04-04: 18:00:00 via INTRAVENOUS
  Filled 2012-04-04: qty 2000

## 2012-04-04 MED ORDER — SUCCINYLCHOLINE CHLORIDE 20 MG/ML IJ SOLN
INTRAMUSCULAR | Status: DC | PRN
  Administered 2012-04-04: 80 mg via INTRAVENOUS

## 2012-04-04 MED ORDER — SODIUM CHLORIDE 0.9 % IJ SOLN: 9.0000 mL | INTRAMUSCULAR | Status: DC | PRN

## 2012-04-04 MED ORDER — DEXTROSE 5 % IV SOLN
2.0000 g | Freq: Once | INTRAVENOUS | Status: AC
  Administered 2012-04-04: 2 g via INTRAVENOUS
  Filled 2012-04-04: qty 2

## 2012-04-04 MED ORDER — ONDANSETRON HCL 4 MG/2ML IJ SOLN
INTRAMUSCULAR | Status: DC | PRN
  Administered 2012-04-04: 4 mg via INTRAVENOUS

## 2012-04-04 MED ORDER — 0.9 % SODIUM CHLORIDE (POUR BTL) OPTIME
TOPICAL | Status: DC | PRN
  Administered 2012-04-04: 1000 mL

## 2012-04-04 MED ORDER — HYDROMORPHONE HCL PF 1 MG/ML IJ SOLN
0.2500 mg | INTRAMUSCULAR | Status: DC | PRN
Start: 2012-04-04 — End: 2012-04-04
  Administered 2012-04-04 (×3): 0.5 mg via INTRAVENOUS

## 2012-04-04 MED ORDER — MORPHINE SULFATE (PF) 1 MG/ML IV SOLN
INTRAVENOUS | Status: DC
  Administered 2012-04-04: 16:00:00 via INTRAVENOUS
  Administered 2012-04-04: 9.4 mg via INTRAVENOUS
  Administered 2012-04-05: 9.5 mg via INTRAVENOUS
  Administered 2012-04-05: 3 mg via INTRAVENOUS
  Administered 2012-04-05: 7 mg via INTRAVENOUS
  Administered 2012-04-05 (×2): 3 mg via INTRAVENOUS
  Administered 2012-04-05: 5 mg via INTRAVENOUS
  Administered 2012-04-06: 2 mg via INTRAVENOUS
  Administered 2012-04-06: 4 mg via INTRAVENOUS
  Administered 2012-04-06: 3 mg via INTRAVENOUS
  Administered 2012-04-06: 1 mg via INTRAVENOUS
  Administered 2012-04-06 (×2): 2 mg via INTRAVENOUS
  Administered 2012-04-07: 3 mg via INTRAVENOUS
  Administered 2012-04-07 (×2): 1 mg via INTRAVENOUS
  Administered 2012-04-07: 2 mg via INTRAVENOUS
  Administered 2012-04-07: 1 mg via INTRAVENOUS
  Administered 2012-04-07: 2 mg via INTRAVENOUS
  Administered 2012-04-08: 1 mg via INTRAVENOUS
  Filled 2012-04-04 (×3): qty 25

## 2012-04-04 MED ORDER — HYDROMORPHONE HCL PF 1 MG/ML IJ SOLN
INTRAMUSCULAR | Status: AC
  Filled 2012-04-04: qty 1

## 2012-04-04 MED ORDER — PROPOFOL 10 MG/ML IV BOLUS
INTRAVENOUS | Status: DC | PRN
  Administered 2012-04-04: 120 mg via INTRAVENOUS

## 2012-04-04 MED ORDER — DIPHENHYDRAMINE HCL 12.5 MG/5ML PO ELIX: 12.5000 mg | ORAL_SOLUTION | Freq: Four times a day (QID) | ORAL | Status: DC | PRN

## 2012-04-04 MED ORDER — SODIUM CHLORIDE 0.9 % IV SOLN
INTRAVENOUS | Status: DC | PRN
  Administered 2012-04-04 (×4): via INTRAVENOUS

## 2012-04-04 MED ORDER — LACTATED RINGERS IV SOLN: INTRAVENOUS | Status: DC

## 2012-04-04 MED ORDER — DROPERIDOL 2.5 MG/ML IJ SOLN
0.6250 mg | INTRAMUSCULAR | Status: DC | PRN
  Filled 2012-04-04: qty 0.25

## 2012-04-04 MED ORDER — DIPHENHYDRAMINE HCL 50 MG/ML IJ SOLN: 12.5000 mg | Freq: Four times a day (QID) | INTRAMUSCULAR | Status: DC | PRN

## 2012-04-04 SURGICAL SUPPLY — 61 items
APPLICATOR COTTON TIP 6IN STRL (MISCELLANEOUS) ×2 IMPLANT
BLADE EXTENDED COATED 6.5IN (ELECTRODE) ×3 IMPLANT
BLADE HEX COATED 2.75 (ELECTRODE) ×3 IMPLANT
CANISTER SUCTION 2500CC (MISCELLANEOUS) ×5 IMPLANT
CATH DRAINAGE MALECOT 26FR (CATHETERS) ×1 IMPLANT
CATH MALECOT (CATHETERS) ×3
CHLORAPREP W/TINT 26ML (MISCELLANEOUS) ×3 IMPLANT
CLIP TI LARGE 6 (CLIP) IMPLANT
CLOTH BEACON ORANGE TIMEOUT ST (SAFETY) ×3 IMPLANT
COVER MAYO STAND STRL (DRAPES) ×3 IMPLANT
COVER SURGICAL LIGHT HANDLE (MISCELLANEOUS) ×3 IMPLANT
DRAPE LAPAROSCOPIC ABDOMINAL (DRAPES) ×3 IMPLANT
DRAPE LG THREE QUARTER DISP (DRAPES) IMPLANT
DRAPE WARM FLUID 44X44 (DRAPE) ×3 IMPLANT
DRESSING TELFA 8X3 (GAUZE/BANDAGES/DRESSINGS) ×2 IMPLANT
DRSG PAD ABDOMINAL 8X10 ST (GAUZE/BANDAGES/DRESSINGS) ×2 IMPLANT
ELECT REM PT RETURN 9FT ADLT (ELECTROSURGICAL) ×3
ELECTRODE REM PT RTRN 9FT ADLT (ELECTROSURGICAL) ×2 IMPLANT
GLOVE BIO SURGEON STRL SZ7 (GLOVE) ×6 IMPLANT
GLOVE BIOGEL PI IND STRL 7.0 (GLOVE) ×3 IMPLANT
GLOVE BIOGEL PI IND STRL 7.5 (GLOVE) ×4 IMPLANT
GLOVE BIOGEL PI INDICATOR 7.0 (GLOVE) ×2
GLOVE BIOGEL PI INDICATOR 7.5 (GLOVE) ×2
GOWN PREVENTION PLUS LG XLONG (DISPOSABLE) ×5 IMPLANT
GOWN PREVENTION PLUS XLARGE (GOWN DISPOSABLE) ×3 IMPLANT
GOWN STRL NON-REIN LRG LVL3 (GOWN DISPOSABLE) ×3 IMPLANT
GOWN STRL REIN XL XLG (GOWN DISPOSABLE) ×3 IMPLANT
KIT BASIN OR (CUSTOM PROCEDURE TRAY) ×3 IMPLANT
LEGGING LITHOTOMY PAIR STRL (DRAPES) IMPLANT
LIGASURE IMPACT 36 18CM CVD LR (INSTRUMENTS) IMPLANT
NS IRRIG 1000ML POUR BTL (IV SOLUTION) ×6 IMPLANT
PACK GENERAL/GYN (CUSTOM PROCEDURE TRAY) ×3 IMPLANT
SCALPEL HARMONIC ACE (MISCELLANEOUS) IMPLANT
SHEARS FOC LG CVD HARMONIC 17C (MISCELLANEOUS) IMPLANT
SPONGE GAUZE 4X4 12PLY (GAUZE/BANDAGES/DRESSINGS) ×3 IMPLANT
SPONGE LAP 18X18 X RAY DECT (DISPOSABLE) IMPLANT
STAPLER GUN LINEAR PROX 60 (STAPLE) ×2 IMPLANT
STAPLER PROXIMATE 75MM BLUE (STAPLE) ×2 IMPLANT
STAPLER VISISTAT 35W (STAPLE) ×3 IMPLANT
SUCTION POOLE TIP (SUCTIONS) ×3 IMPLANT
SUT ETHILON 2 0 PS N (SUTURE) ×2 IMPLANT
SUT ETHILON 3 0 PS 1 (SUTURE) ×4 IMPLANT
SUT PDS AB 1 CTX 36 (SUTURE) IMPLANT
SUT PDS AB 1 TP1 96 (SUTURE) ×4 IMPLANT
SUT PDS AB 3-0 CT2 27 (SUTURE) ×4 IMPLANT
SUT PDS AB 3-0 SH 27 (SUTURE) ×1 IMPLANT
SUT PDS AB 4-0 SH 27 (SUTURE) IMPLANT
SUT PROLENE 2 0 BLUE (SUTURE) IMPLANT
SUT SILK 2 0 (SUTURE) ×3
SUT SILK 2 0 SH CR/8 (SUTURE) ×4 IMPLANT
SUT SILK 2 0SH CR/8 30 (SUTURE) IMPLANT
SUT SILK 2-0 18XBRD TIE 12 (SUTURE) ×3 IMPLANT
SUT SILK 2-0 30XBRD TIE 12 (SUTURE) IMPLANT
SUT SILK 3 0 (SUTURE)
SUT SILK 3 0 SH CR/8 (SUTURE) ×6 IMPLANT
SUT SILK 3-0 18XBRD TIE 12 (SUTURE) ×2 IMPLANT
SUT VIC AB 3-0 SH 18 (SUTURE) ×2 IMPLANT
TAPE CLOTH SURG 4X10 WHT LF (GAUZE/BANDAGES/DRESSINGS) ×2 IMPLANT
TOWEL OR 17X26 10 PK STRL BLUE (TOWEL DISPOSABLE) ×7 IMPLANT
TRAY FOLEY CATH 14FRSI W/METER (CATHETERS) ×3 IMPLANT
YANKAUER SUCT BULB TIP NO VENT (SUCTIONS) ×3 IMPLANT

## 2012-04-04 NOTE — Preoperative (Addendum)
Beta Blockers   Reason not to administer Beta Blockers:Not Applicable NPO, SBO had IV dose this AM

## 2012-04-04 NOTE — Progress Notes (Signed)
Discussed at long length of stay meeting  

## 2012-04-04 NOTE — Progress Notes (Signed)
PARENTERAL NUTRITION CONSULT NOTE - Follow Up  Pharmacy Consult for TNA  Indication: SBO 2/2 neoplasm  Allergies  Allergen Reactions  . Pindolol Swelling    Throat swelled up  . Iodine   . Shellfish Allergy   . Vibramycin (Doxycycline Calcium) Nausea Only  . Doxycycline Nausea Only   Patient Measurements: Height: 5\' 1"  (154.9 cm) Weight: 139 lb (63.05 kg) IBW/kg (Calculated) : 47.8   Vital Signs: Temp: 97.9 F (36.6 C) (09/10 0753) Temp src: Oral (09/10 0753) BP: 109/70 mmHg (09/10 0753) Pulse Rate: 84  (09/10 0753) Intake/Output from previous day: 09/09 0701 - 09/10 0700 In: 1265 [I.V.:605; TPN:660] Out: 2603 [Urine:2100; Emesis/NG output:500; Stool:3] Intake/Output from this shift: Total I/O In: 1000 [I.V.:1000] Out: -   Labs:  Basename 04/03/12 0505  WBC 14.6*  HGB 11.3*  HCT 33.8*  PLT 335  APTT --  INR --     Basename 04/03/12 0505 04/02/12 0615  NA 126* 128*  K 4.3 4.4  CL 92* 93*  CO2 26 29  GLUCOSE 105* 110*  BUN 19 17  CREATININE 0.60 0.62  LABCREA -- --  CREAT24HRUR -- --  CALCIUM 8.6 9.1  MG 2.0 --  PHOS 3.3 --  PROT 5.2* 5.3*  ALBUMIN 2.4* 2.5*  AST 25 25  ALT 24 18  ALKPHOS 112 96  BILITOT 0.3 0.2*  BILIDIR -- --  IBILI -- --  PREALBUMIN 20.2 --  TRIG 118 --  CHOLHDL -- --  CHOL 93 --   Estimated Creatinine Clearance: 45.3 ml/min (by C-G formula based on Cr of 0.6).    Basename 04/04/12 0607 04/04/12 0220 04/03/12 2329  GLUCAP 131* 131* 115*   CBGs & Insulin requirements past 24 hours:   CBGs 115-138, received 2 units of sensitive SSI.  Nutritional Goals:   9/4 RD recs: Kcal 1650-1850, Protein: 65-75g, Fluid > 1.7L  Clinimix E 5/20 @ 60 ml/hr + IVF 20% on MWF will provide 72g protein/day, 1746 Kcal on MWF and 1267 Kcal on other days for an average of 1473 Kcal per day of the week.  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  Patient developed small bowel obstruction 2/2 neoplasm and without PO intake x 1 week.  TNA started 9/3. Plan for GYN oncology and surgery consult.   No history of DM - recent CGBs <150   NG replaced 9/6, out yesterday.   Pt is currently in OR to attempt to relieve obstruction and possibly place a G-tube  No new labs today  Renal/hepatic function: wnl   Electrolytes:  Na remains low(unable to correct in TNA - premixed, commercially prepared product).    Pre-Albumin: 6.5(9/4)  TG/Cholesterol: wnl(9/9)  Plan:    Continue Clinimix E 5/20 at 60 ml/hr. (goal rate)  TNA to contain IV fat emulsion, standard multivitamins and trace elements only on MWF only due to ongoing shortage.  Continue sensitive SSI to q8h.  TNA labs Monday/Thursdays.  F/U post-op plans: TNA vs TF via G-tube?  Darrol Angel, PharmD Pager: [phone] 04/04/2012 11:22 AM

## 2012-04-04 NOTE — Anesthesia Preprocedure Evaluation (Signed)
Anesthesia Evaluation  Patient identified by MRN, date of birth, ID band Patient awake    Reviewed: Allergy & Precautions, H&P , NPO status , Patient's Chart, lab work & pertinent test results  Airway Mallampati: II TM Distance: >3 FB Neck ROM: Full    Dental No notable dental hx.    Pulmonary neg pulmonary ROS,  breath sounds clear to auscultation  Pulmonary exam normal       Cardiovascular hypertension, Pt. on medications + CAD negative cardio ROS  + dysrhythmias Atrial Fibrillation + pacemaker Rhythm:Regular Rate:Normal     Neuro/Psych negative neurological ROS  negative psych ROS   GI/Hepatic negative GI ROS, Neg liver ROS,   Endo/Other  negative endocrine ROS  Renal/GU negative Renal ROS  negative genitourinary   Musculoskeletal negative musculoskeletal ROS (+)   Abdominal   Peds negative pediatric ROS (+)  Hematology negative hematology ROS (+)   Anesthesia Other Findings Multiple front caps crowns  Reproductive/Obstetrics negative OB ROS                           Anesthesia Physical Anesthesia Plan  ASA: III  Anesthesia Plan: General   Post-op Pain Management:    Induction: Intravenous, Rapid sequence and Cricoid pressure planned  Airway Management Planned: Oral ETT  Additional Equipment:   Intra-op Plan:   Post-operative Plan: Extubation in OR  Informed Consent: I have reviewed the patients History and Physical, chart, labs and discussed the procedure including the risks, benefits and alternatives for the proposed anesthesia with the patient or authorized representative who has indicated his/her understanding and acceptance.   Dental advisory given  Plan Discussed with: CRNA  Anesthesia Plan Comments:         Anesthesia Quick Evaluation

## 2012-04-04 NOTE — Op Note (Signed)
Preoperative diagnosis ovarian cancer with small bowel obstruction Postoperative diagnoses: Same as above Procedure: #1 laparotomy with bypass, and from jejunum to jejunum, of pelvic mass #2 Stamm gastrostomy #3 descending loop colostomy Surgeon: Dr. Harden Mo Asst.: Dr. Ovidio Kin Anesthesia: Gen. Endotracheal Estimated blood loss: Minimal Specimens: None Drains: None Complications: None Sponge needle count was correct x2 at operation Disposition to recovery room stable condition, she will go to step down overnight  Indications: This is a 78 year old female who was first noted to have rectal bleeding and then underwent a biopsy of the mass that appeared to be in her rectum. This ended up being a primary gynecologic malignancy that eroded into her rectum. She underwent complete evaluation and was found to have several large pelvic masses. She then developed a small bowel obstruction which appeared to start to resolve on its own but now has recurred. She and I discussed all of her different options and decided to go to the operating room.  Procedure: After informed consent was obtained the patient was taken to the operating room. She was administered cefoxitin. Sequential compression devices were placed on her legs. She was then placed under general endotracheal anesthesia without complication. Her abdomen was then prepped and draped in the standard sterile surgical fashion. Surgical timeout was performed.  I made a midline incision. I carried this out to her fashion in her peritoneum without any injury. I opened the incision widely. She was noted to have very dilated small bowel. I placed a Engineer, manufacturing. I traced the small bowel down to the pelvis. She had a very large pelvic mass. There was one single loop of small bowel that was stuck way down in her pelvis. I made an attempt to see if I could free this. I was very concerned I would injure the bowel in a difficult placed down deep  in her pelvis that I  could not address so I elected to bypass her. I brought the jejunum next to the jejunum. One of these was a dilated loop going into the mass and the other was the decompressed limb coming out of the mass. I then approximated these loops of bowel with 3-0 silk suture. I made enterotomies. I used a 75 GIA stapler to make a common enterotomy. I then closed the last enterotomy with a TA stapler. All of these were patent. This was hemostatic.   I then placed a gastrostomy tube. I mobilized her stomach. I made a hole in her left upper quadrant and carried this out through into her peritoneal cavity. I placed 2 pursestring sutures of 2-0 silk in her stomach. I then entered her stomach in place the [place] gastrostomy tube through the hole and into the stomach. I tied down the pursestring sutures. I then proceeded to use 2-0 silk to tack the stomach to the abdominal wall surrounding the tube. This was in good position. I then secured this with 2-0 nylon suture.  I also diverted her due to the fact she has this large pelvic mass in the serosa and into her rectum. I elected to do a descending colostomy. This was very free. I released the white line with cautery. I then brought this up at the level of the iliac spine and brought through the fascia. This colostomy appeared to be in good position without any tension. I then irrigated the abdomen. I then closes with #1 loop PDS. I stapled her umbilicus shut and left the remaining part of her incision opened. I then  matured her colostomy with 3-0 Vicryl suture after placing a bridge underneath this as well. Dressings were placed. She tolerated this well was extubated and transferred to recovery stable.

## 2012-04-04 NOTE — Progress Notes (Signed)
IP PROGRESS NOTE  Subjective:  No nausea or pain with the NG tube in place. She passed CT contrast per rectum.   Objective: Vital signs in last 24 hours: Blood pressure 109/70, pulse 84, temperature 97.9 F (36.6 C), temperature source Oral, resp. rate 20, height 5\' 1"  (1.549 m), weight 139 lb (63.05 kg), SpO2 92.00%.  Intake/Output from previous day: 09/09 0701 - 09/10 0700 In: 1265 [I.V.:605; TPN:660] Out: 2603 [Urine:2100; Emesis/NG output:500; Stool:3]  Physical Exam: HEENT: No thrush Lungs: Decreased breath sounds at the right greater than left lower chest, no respiratory distress Cardiac: Regular rate and rhythm Abdomen: Soft and nontender Extremities: No leg edema     Lab Results:  Basename 04/03/12 0505  WBC 14.6*  HGB 11.3*  HCT 33.8*  PLT 335    BMET  Basename 04/03/12 0505 04/02/12 0615  NA 126* 128*  K 4.3 4.4  CL 92* 93*  CO2 26 29  GLUCOSE 105* 110*  BUN 19 17  CREATININE 0.60 0.62  CALCIUM 8.6 9.1   X-rays: CT of the abdomen and pelvis on 04/03/2012-right pelvic masses invading the rectum and causing a small bowel obstruction  Medications: I have reviewed the patient's current medications.  Assessment/Plan:  1. 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 A staging CT scan 03/02/2012 confirmed a mass at the right aspect of the rectum with nodal masses in the abdomen/pelvis. Elevated CA 125. Normal CEA  2. Small bowel obstruction secondary to pelvic masses-the NG tube was removed on 03/29/2012. Recurrent obstructive symptoms on 03/31/2012, an NG tube remains in place.  3. atrial fibrillation/flutter-medical therapy per cardiology-she appears to have a regular rhythm at present. She remains on Cardizem and Lopressor for rate control. Cardiology is following her  4. Anticoagulation-Coumadin on hold,  she received low-dose vitamin K in the p.m. on 03/24/2012. She has been on IV heparin during this hospital admission. I discussed the risk/benefit of continuing anticoagulation therapy with cardiology. Heparin was discontinued. She will begin aspirin prophylaxis when taking by mouth. Now on prophylactic dose Lovenox.  5. Hypokalemia-the potassium is now in the normal range  6. Nutrition-TNA started on 03/28/2012  I discussed the case with Dr. Dwain Sarna yesterday. The plan is to proceed with an exploratory laparotomy and bowel resection as indicated. Dr. Dwain Sarna plans to perform a diverting colostomy and gastrostomy tube placement.  Ms. Horsch will be transferred to the surgical service today. I will continue following her. The plan is to begin Taxol/carboplatin chemotherapy when she has recovered from surgery.  I discussed treatment plans with her family last night and again this morning.    LOS: 11 days   Rocquel Askren  04/04/2012, 11:03 AM

## 2012-04-04 NOTE — Anesthesia Postprocedure Evaluation (Signed)
  Anesthesia Post-op Note  Patient: Victoria [name]  Procedure(s) Performed: Procedure(s) (LRB): COLON RESECTION (N/A)  Patient Location: PACU  Anesthesia Type: General  Level of Consciousness: awake and alert   Airway and Oxygen Therapy: Patient Spontanous Breathing  Post-op Pain: mild  Post-op Assessment: Post-op Vital signs reviewed, Patient's Cardiovascular Status Stable, Respiratory Function Stable, Patent Airway and No signs of Nausea or vomiting  Post-op Vital Signs: stable  Complications: No apparent anesthesia complications

## 2012-04-04 NOTE — Transfer of Care (Signed)
Immediate Anesthesia Transfer of Care Note  Patient: Victoria [name]  Procedure(s) Performed: Procedure(s) (LRB) with comments: COLON RESECTION (N/A) - laparotomy with small bowel resection for obstruction with colostomy with gastrostomy tube placement.   Patient Location: PACU  Anesthesia Type: General  Level of Consciousness: awake, alert , oriented and patient cooperative  Airway & Oxygen Therapy: Patient Spontanous Breathing and Patient connected to face mask oxygen  Post-op Assessment: Report given to PACU RN and Post -op Vital signs reviewed and stable  Post vital signs: Reviewed and stable  Complications: No apparent anesthesia complications

## 2012-04-04 NOTE — Progress Notes (Signed)
Subjective: Some flatus and stool, ct reviewed  Objective: Vital signs in last 24 hours: Temp:  [97.4 F (36.3 C)-98.2 F (36.8 C)] 98.2 F (36.8 C) (09/10 0625) Pulse Rate:  [70-86] 86  (09/10 0625) Resp:  [18] 18  (09/10 0625) BP: (102-134)/(66-92) 116/75 mmHg (09/10 0625) SpO2:  [92 %-96 %] 92 % (09/10 0625) Last BM Date: 04/03/12  Intake/Output from previous day: 09/09 0701 - 09/10 0700 In: 1265 [I.V.:605; TPN:660] Out: 2603 [Urine:2100; Emesis/NG output:500; Stool:3] Intake/Output this shift:    GI: unchanged  Lab Results:   Basename 04/03/12 0505  WBC 14.6*  HGB 11.3*  HCT 33.8*  PLT 335   BMET  Basename 04/03/12 0505 04/02/12 0615  NA 126* 128*  K 4.3 4.4  CL 92* 93*  CO2 26 29  GLUCOSE 105* 110*  BUN 19 17  CREATININE 0.60 0.62  CALCIUM 8.6 9.1   PT/INR No results found for this basename: LABPROT:2,INR:2 in the last 72 hours ABG No results found for this basename: PHART:2,PCO2:2,PO2:2,HCO3:2 in the last 72 hours  Studies/Results: Ct Abdomen Pelvis W Contrast  04/03/2012  *RADIOLOGY REPORT*  Clinical Data: Abdominal distention.  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 Probable surgery tomorrow.  CT ABDOMEN AND PELVIS WITH CONTRAST  Technique:  Multidetector CT imaging of the abdomen and pelvis was performed following the standard protocol during bolus administration of intravenous contrast.  Contrast: OMNIPAQUE IOHEXOL 300 MG/ML  SOLN  Comparison: Abdomen and pelvis CT dated 03/02/2012 and PET CT dated 03/22/2012.  Findings: Nasogastric tube tip in the region of the distal stomach, somewhat difficult to assess due to patient motion.  Multiple significantly dilated proximal small bowel loops with normal caliber distal loops.  The exact level of the obstruction is difficult to assess due to patient motion degrading the images through the mid and upper abdomen.  However, there is a less pronounced dilated  small bowel loop which tapers gradually to an area of angulation at the location of the previously demonstrated mass in the pelvis on the right.  The main component of pelvic mass on the right measured 8.9 x 7.4 cm in maximum dimensions in the transverse plane on 03/02/2012 and currently measures 9.4 x 6.8 cm in corresponding dimensions on image number 55 of series 2.  A smaller adjacent mass on the right is becoming confluent with the larger mass on the current images.  There is a third mass in the inferior pelvis on the right, invading the rectum with interval central cavitation.  This mass previously measured 6.7 x 5.3 cm in maximum dimensions on 03/02/2012 and currently measures 6.9 x 6.2 cm in corresponding dimensions on image number 71 of series 2.  The pelvic masses appear confluent on sagittal image number 56, measuring 16.8 cm in maximum diameter.  On coronal image number 66, there is perianal extension of tumor measuring 5.3 x 3.7 cm.  No enlarged lymph nodes are identified.  The adrenal glands, liver, spleen, pancreas, gallbladder and right kidney are grossly unremarkable.  The left kidney remains somewhat small.  Interval small bilateral pleural effusions and adjacent bibasilar atelectasis, not included in its entirety.  Edema in the subcutaneous fat laterally on both sides.  The previously seen small amount of free peritoneal fluid is no longer demonstrated.  Multiple colonic diverticula are noted without evidence of diverticulitis.  No enlarged lymph nodes are seen.  Lumbar and lower thoracic spine degenerative changes are noted.  IMPRESSION:  1.  Confluent pelvic masses on the right, measuring 16.8 cm in maximum diameter, invading the rectum and causing small bowel obstruction, as described above. 2.  Limited evaluation of the upper mid abdomen due to patient motion. 3.  Interval small bilateral pleural effusions and bibasilar atelectasis. 4.  Extensive colonic diverticulosis.   Original Report  Authenticated By: Darrol Angel, M.D.     Anti-infectives: Anti-infectives    None      Assessment/Plan: malignant sbo  LOS: 11 days  Ct reviewed, I still think operating room is now only choice to try and relieve this.  We discussed again this am and will proceed  [facility] 04/04/2012

## 2012-04-05 ENCOUNTER — Encounter (HOSPITAL_COMMUNITY): Payer: Self-pay | Admitting: Cardiology

## 2012-04-05 DIAGNOSIS — E46 Unspecified protein-calorie malnutrition: Secondary | ICD-10-CM | POA: Diagnosis not present

## 2012-04-05 DIAGNOSIS — C801 Malignant (primary) neoplasm, unspecified: Secondary | ICD-10-CM | POA: Diagnosis not present

## 2012-04-05 DIAGNOSIS — E871 Hypo-osmolality and hyponatremia: Secondary | ICD-10-CM | POA: Diagnosis not present

## 2012-04-05 DIAGNOSIS — K5669 Other intestinal obstruction: Secondary | ICD-10-CM | POA: Diagnosis not present

## 2012-04-05 LAB — CBC
MCH: 28.8 pg (ref 26.0–34.0)
MCHC: 32.9 g/dL (ref 30.0–36.0)
Platelets: 294 10*3/uL (ref 150–400)
RBC: 3.96 MIL/uL (ref 3.87–5.11)

## 2012-04-05 LAB — BASIC METABOLIC PANEL
Calcium: 8.5 mg/dL (ref 8.4–10.5)
GFR calc Af Amer: >90 mL/min (ref >90)
GFR calc non Af Amer: 79 mL/min — ABNORMAL LOW (ref >90)
Glucose, Bld: 108 mg/dL — ABNORMAL HIGH (ref 70–99)
Potassium: 4.8 mEq/L (ref 3.5–5.1)
Sodium: 125 mEq/L — ABNORMAL LOW (ref 135–145)

## 2012-04-05 LAB — GLUCOSE, CAPILLARY
Glucose-Capillary: 115 mg/dL — ABNORMAL HIGH (ref 70–99)
Glucose-Capillary: 121 mg/dL — ABNORMAL HIGH (ref 70–99)

## 2012-04-05 MED ORDER — FAT EMULSION 20 % IV EMUL
240.0000 mL | INTRAVENOUS | Status: AC
  Administered 2012-04-05: 240 mL via INTRAVENOUS
  Filled 2012-04-05: qty 250

## 2012-04-05 MED ORDER — ZINC TRACE METAL 1 MG/ML IV SOLN
INTRAVENOUS | Status: AC
  Administered 2012-04-05: 18:00:00 via INTRAVENOUS
  Filled 2012-04-05: qty 2000

## 2012-04-05 NOTE — Progress Notes (Signed)
Subjective:  Up in chair, no cardiac complaints; tolerating Afib/flutter with rates in high 90s-low 100s  Objective:  Vital Signs in the last 24 hours: Temp:  [96.5 F (35.8 C)-98.7 F (37.1 C)] 98.5 F (36.9 C) (09/11 1200) Pulse Rate:  [64-117] 91  (09/11 1000) Resp:  [11-20] 19  (09/11 1143) BP: (98-129)/(55-99) 104/57 mmHg (09/11 1000) SpO2:  [94 %-99 %] 96 % (09/11 1143) FiO2 (%):  [96 %] 96 % (09/11 0400) Weight:  [64 kg (141 lb 1.5 oz)-66.1 kg (145 lb 11.6 oz)] 66.1 kg (145 lb 11.6 oz) (09/11 0400)  Intake/Output from previous day:  Intake/Output Summary (Last 24 hours) at 04/05/12 1333 Last data filed at 04/05/12 1300  Gross per 24 hour  Intake   1917 ml  Output    850 ml  Net   1067 ml    Physical Exam: General appearance: alert, cooperative and no distress Lungs: decreased breath sounds Heart: regular rhythm with increased rate Abd: remains mildly distended but BS present. Ext: no edema   Rate: 118  Rhythm: atrial flutter  Lab Results:  Basename 04/05/12 0412 04/03/12 0505  WBC 19.5* 14.6*  HGB 11.4* 11.3*  PLT 294 335    Basename 04/05/12 0412 04/03/12 0505  NA 125* 126*  K 4.8 4.3  CL 97 92*  CO2 24 26  GLUCOSE 108* 105*  BUN 24* 19  CREATININE 0.68 0.60   No results found for this basename: TROPONINI:2,CK,MB:2 in the last 72 hours Hepatic Function Panel  Basename 04/03/12 0505  PROT 5.2*  ALBUMIN 2.4*  AST 25  ALT 24  ALKPHOS 112  BILITOT 0.3  BILIDIR --  IBILI --    Basename 04/03/12 0505  CHOL 93   No results found for this basename: INR in the last 72 hours  Imaging: Imaging results have been reviewed  Cardiac Studies:  Assessment/Plan:   Principal Problem:  *Small bowel obstruction, surgery 04/04/12 Active Problems:  Rectal carcinoma  PAF fib/flutter- Hx RFA 2010  Chronic anticoagulation  Pelvic mass in female, necrotic  S/P [facility] Jude Pace Feb 2006 after failed RFA  Hyponatremia   Plan- I reviewed he history.  There is no history of CAD. Pacer placed in Feb 2010 after failed RFA for flutter. I will check our office records for an echo or Myoview. She is currently on IV Diltiazem at 5mg /hr  Corine Shelter PA-C 04/05/2012, 1:33 PM  I have seen & examined the patient & reviewed th chart this PM.  I agree with Luke's findings, exam & recommendations. Feeling relatively well post-op.  No CHF Sx, tolerating current rate -- not optimal, but adequate control on IV Diltiazem gtt & IV Lopressor (no room to titrate up from BP standpoint.  Hyponatremia - likely due to recent NGT, poor intake abd Sgx.    We will continue to monitor her post-operatively.    Marykay Lex, M.D., M.S. [facility] [address] [place], [place]  [address]  [phone] Pager # [phone]  04/05/2012 4:53 PM

## 2012-04-05 NOTE — Progress Notes (Signed)
1 Day Post-Op  Subjective: Doing well this am, no real complaints   Objective: Vital signs in last 24 hours: Temp:  [96.5 F (35.8 C)-98.7 F (37.1 C)] 97.4 F (36.3 C) (09/11 0400) Pulse Rate:  [64-111] 110  (09/11 0318) Resp:  [11-22] 18  (09/11 0800) BP: (98-132)/(55-73) 98/71 mmHg (09/11 0318) SpO2:  [94 %-100 %] 95 % (09/11 0800) FiO2 (%):  [96 %] 96 % (09/11 0400) Weight:  [141 lb 1.5 oz (64 kg)-145 lb 11.6 oz (66.1 kg)] 145 lb 11.6 oz (66.1 kg) (09/11 0400) Last BM Date: 04/03/12  Intake/Output from previous day: 09/10 0701 - 09/11 0700 In: 4812 [I.V.:3690; NG/GT:60; IV Piggyback:2; TPN:820] Out: 960 [Urine:885; Blood:75] Intake/Output this shift:    General appearance: no distress Resp: clear to auscultation bilaterally Cardio: irreg irreg GI: approp tender wound clean ostomy dusky but I think this is because she is still distended and pulling on it hopefully this will be fine, g tube draining  Lab Results:   Basename 04/05/12 0412 04/03/12 0505  WBC 19.5* 14.6*  HGB 11.4* 11.3*  HCT 34.6* 33.8*  PLT 294 335   BMET  Basename 04/05/12 0412 04/03/12 0505  NA 125* 126*  K 4.8 4.3  CL 97 92*  CO2 24 26  GLUCOSE 108* 105*  BUN 24* 19  CREATININE 0.68 0.60  CALCIUM 8.5 8.6   PT/INR No results found for this basename: LABPROT:2,INR:2 in the last 72 hours ABG No results found for this basename: PHART:2,PCO2:2,PO2:2,HCO3:2 in the last 72 hours  Studies/Results: Ct Abdomen Pelvis W Contrast  04/03/2012  *RADIOLOGY REPORT*  Clinical Data: Abdominal distention.  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 Probable surgery tomorrow.  CT ABDOMEN AND PELVIS WITH CONTRAST  Technique:  Multidetector CT imaging of the abdomen and pelvis was performed following the standard protocol during bolus administration of intravenous contrast.  Contrast: OMNIPAQUE IOHEXOL 300 MG/ML  SOLN  Comparison: Abdomen and pelvis CT dated  03/02/2012 and PET CT dated 03/22/2012.  Findings: Nasogastric tube tip in the region of the distal stomach, somewhat difficult to assess due to patient motion.  Multiple significantly dilated proximal small bowel loops with normal caliber distal loops.  The exact level of the obstruction is difficult to assess due to patient motion degrading the images through the mid and upper abdomen.  However, there is a less pronounced dilated small bowel loop which tapers gradually to an area of angulation at the location of the previously demonstrated mass in the pelvis on the right.  The main component of pelvic mass on the right measured 8.9 x 7.4 cm in maximum dimensions in the transverse plane on 03/02/2012 and currently measures 9.4 x 6.8 cm in corresponding dimensions on image number 55 of series 2.  A smaller adjacent mass on the right is becoming confluent with the larger mass on the current images.  There is a third mass in the inferior pelvis on the right, invading the rectum with interval central cavitation.  This mass previously measured 6.7 x 5.3 cm in maximum dimensions on 03/02/2012 and currently measures 6.9 x 6.2 cm in corresponding dimensions on image number 71 of series 2.  The pelvic masses appear confluent on sagittal image number 56, measuring 16.8 cm in maximum diameter.  On coronal image number 66, there is perianal extension of tumor measuring 5.3 x 3.7 cm.  No enlarged lymph nodes are identified.  The adrenal glands, liver, spleen, pancreas, gallbladder  and right kidney are grossly unremarkable.  The left kidney remains somewhat small.  Interval small bilateral pleural effusions and adjacent bibasilar atelectasis, not included in its entirety.  Edema in the subcutaneous fat laterally on both sides.  The previously seen small amount of free peritoneal fluid is no longer demonstrated.  Multiple colonic diverticula are noted without evidence of diverticulitis.  No enlarged lymph nodes are seen.  Lumbar  and lower thoracic spine degenerative changes are noted.  IMPRESSION:  1.  Confluent pelvic masses on the right, measuring 16.8 cm in maximum diameter, invading the rectum and causing small bowel obstruction, as described above. 2.  Limited evaluation of the upper mid abdomen due to patient motion. 3.  Interval small bilateral pleural effusions and bibasilar atelectasis. 4.  Extensive colonic diverticulosis.   Original Report Authenticated By: Darrol Angel, M.D.     Anti-infectives: Anti-infectives     Start     Dose/Rate Route Frequency Ordered Stop   04/04/12 0800   cefOXitin (MEFOXIN) 2 g in dextrose 5 % 50 mL IVPB        2 g 100 mL/hr over 30 Minutes Intravenous  Once 04/04/12 0734 04/04/12 0946          Assessment/Plan: POD 1 1. Cont pca 2. pulm toilet 3. Cont dilt, appreciate cards input 4. Will dc ng, stay npo, cont tna, g tube to gravity 5. Lovenox, scds 6. Monitor ostomy 7. tx to telemetry   LOS: 12 days    [facility] 04/05/2012

## 2012-04-05 NOTE — Progress Notes (Signed)
PARENTERAL NUTRITION CONSULT NOTE - Follow Up  Pharmacy Consult for TNA  Indication: SBO 2/2 neoplasm  Allergies  Allergen Reactions  . Pindolol Swelling    Throat swelled up  . Iodine   . Shellfish Allergy   . Vibramycin (Doxycycline Calcium) Nausea Only  . Doxycycline Nausea Only   Patient Measurements: Height: 5\' 1"  (154.9 cm) Weight: 145 lb 11.6 oz (66.1 kg) IBW/kg (Calculated) : 47.8   Vital Signs: Temp: 97.4 F (36.3 C) (09/11 0400) Temp src: Oral (09/11 0400) BP: 98/71 mmHg (09/11 0318) Pulse Rate: 110  (09/11 0318) Intake/Output from previous day: 09/10 0701 - 09/11 0700 In: 4812 [I.V.:3690; NG/GT:60; IV Piggyback:2; TPN:820] Out: 960 [Urine:885; Blood:75] Intake/Output from this shift:    Labs:  [facility] 04/05/12 0412 04/03/12 0505  WBC 19.5* 14.6*  HGB 11.4* 11.3*  HCT 34.6* 33.8*  PLT 294 335  APTT -- --  INR -- --     Basename 04/05/12 0412 04/03/12 0505  NA 125* 126*  K 4.8 4.3  CL 97 92*  CO2 24 26  GLUCOSE 108* 105*  BUN 24* 19  CREATININE 0.68 0.60  LABCREA -- --  CREAT24HRUR -- --  CALCIUM 8.5 8.6  MG -- 2.0  PHOS -- 3.3  PROT -- 5.2*  ALBUMIN -- 2.4*  AST -- 25  ALT -- 24  ALKPHOS -- 112  BILITOT -- 0.3  BILIDIR -- --  IBILI -- --  PREALBUMIN -- 20.2  TRIG -- 118  CHOLHDL -- --  CHOL -- 93   Estimated Creatinine Clearance: 46.3 ml/min (by C-G formula based on Cr of 0.68).    Basename 04/05/12 0622 04/04/12 2343 04/04/12 1731  GLUCAP 153* 115* 203*   CBGs & Insulin requirements past 24 hours:   CBGs 115-203, received 7 units of sensitive SSI.  Nutritional Goals:   9/4 RD recs: Kcal 1650-1850, Protein: 65-75g, Fluid > 1.7L  Clinimix E 5/20 @ 60 ml/hr + IVF 20% on MWF will provide 72g protein/day, 1746 Kcal on MWF and 1267 Kcal on other days for an average of 1473 Kcal per day of the week.  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  Patient developed small bowel obstruction 2/2 neoplasm and without PO intake x 1 week.  TNA started 9/3. Plan for GYN oncology and surgery consult.   POD#1 colon resection.  G-tube placed in OR.  No history of DM - most CGBs at goal <150   Electrolytes: Na low (unable to correct in premixed TNA), other lytes WNL  Renal/hepatic function: wnl   Pre-Albumin: 20.2 (9/9), 6.5(9/4)  TG/Cholesterol: wnl (9/9)  Plan:    Continue Clinimix E 5/20 at 60 ml/hr. (goal rate)  TNA to contain IV fat emulsion, standard multivitamins and trace elements only on MWF only due to ongoing shortage.  Continue sensitive SSI to q8h.  TNA labs Monday/Thursdays.  F/U post-op plans: TNA vs TF via G-tube?  Clance Boll, PharmD, BCPS Pager: [phone] 04/05/2012 7:53 AM

## 2012-04-05 NOTE — Progress Notes (Signed)
Report called to [facility] on 4E. Pt currently resting in bed with no complaints.

## 2012-04-05 NOTE — Progress Notes (Signed)
[id number] Earlene Plater, RN, BSN, CCM: CHART REVIEWED AND UPDATED. NO DISCHARGE NEEDS PRESENT AT THIS TIME. CASE MANAGEMENT [phone]

## 2012-04-05 NOTE — Progress Notes (Signed)
Chaplain spoke with pt on referral by nursing.   Provided support around illness, course of treatment, hospitalization.   Mrs. Marlo expressed feeling hopeful and joyous - "counting her blessings" and is looking forward to going home.   Pt's faith figures prominently in pt's coping.  Expressed no needs at this time.    Chaplain will continue to follow during admission and assess for support.     Belva Crome  MDiv, Chaplain

## 2012-04-06 LAB — COMPREHENSIVE METABOLIC PANEL
ALT: 10 U/L (ref 0–35)
Alkaline Phosphatase: 85 U/L (ref 39–117)
BUN: 25 mg/dL — ABNORMAL HIGH (ref 6–23)
CO2: 26 mEq/L (ref 19–32)
GFR calc Af Amer: >90 mL/min (ref >90)
GFR calc non Af Amer: 80 mL/min — ABNORMAL LOW (ref >90)
Glucose, Bld: 129 mg/dL — ABNORMAL HIGH (ref 70–99)
Potassium: 4.5 mEq/L (ref 3.5–5.1)
Sodium: 125 mEq/L — ABNORMAL LOW (ref 135–145)

## 2012-04-06 LAB — TYPE AND SCREEN
ABO/RH(D): O NEG
Unit division: 0

## 2012-04-06 LAB — CBC
MCH: 29.6 pg (ref 26.0–34.0)
MCHC: 34 g/dL (ref 30.0–36.0)
MCV: 87 fL (ref 78.0–100.0)
Platelets: 235 10*3/uL (ref 150–400)
RDW: 14.8 % (ref 11.5–15.5)

## 2012-04-06 LAB — GLUCOSE, CAPILLARY
Glucose-Capillary: 118 mg/dL — ABNORMAL HIGH (ref 70–99)
Glucose-Capillary: 115 mg/dL — ABNORMAL HIGH (ref 70–99)

## 2012-04-06 MED ORDER — ALTEPLASE 2 MG IJ SOLR
2.0000 mg | Freq: Once | INTRAMUSCULAR | Status: AC
  Administered 2012-04-06: 2 mg
  Filled 2012-04-06: qty 2

## 2012-04-06 MED ORDER — CLINIMIX E/DEXTROSE (5/20) 5 % IV SOLN
INTRAVENOUS | Status: DC
  Administered 2012-04-06: 17:00:00 via INTRAVENOUS
  Filled 2012-04-06: qty 2000

## 2012-04-06 NOTE — Care Management Note (Signed)
    Page 1 of 2   04/14/2012     10:53:10 AM   CARE MANAGEMENT NOTE 04/14/2012  Patient:  DAZJA, HOUCHIN   Account Number:  [id number]  Date Initiated:  03/30/2012  Documentation initiated by:  Colleen Can  Subjective/Objective Assessment:   dx SBO, rectal mass, atrial fib/has pacemaker.  Pt is from home with spouse     Action/Plan:   Medically complex ; d/c plans are uncertain at this time at this time/CM will follow for needs   Anticipated DC Date:  04/14/2012   Anticipated DC Plan:  HOME W HOME HEALTH SERVICES      DC Planning Services  CM consult      Choice offered to / List presented to:  C-1 Patient        HH arranged  HH-1 RN  HH-2 PT      [facility] agency  [facility]   Status of service:  Completed, signed off Medicare Important Message given?   (If response is "NO", the following Medicare IM given date fields will be blank) Date Medicare IM given:   Date Additional Medicare IM given:    Discharge Disposition:  HOME W HOME HEALTH SERVICES  Per UR Regulation:  Reviewed for med. necessity/level of care/duration of stay  If discussed at Long Length of Stay Meetings, dates discussed:   03/30/2012  04/04/2012  04/06/2012  04/10/2012  04/13/2012    Comments:  04/14/12 Pinchus Weckwerth RN,BSN NCM [phone] [facility]) ALREADY FOLLOWING FOR HHRN/PT,AWARE OF D/C TODAY W/HH ORDERS IN PLACE.  04/13/12 Nikira Kushnir RN,BSN NCM [phone] [facility]) FOLLOWING FOR HHRN-OSTOMY CARE/DSG CHANGES/PT FOR HOME. IMPROVING,TNA WEANING,ADVANCING DIET,MINIMAL JP,CARDIO TO EVAL CONTINUING CARDIZEM GTT.LIKELY D/C HOME W/HH IN [facility] ALREADY FOLLOWING HHRN/PT/OT.WILL NEED ORDER PUT IN. 04/10/12 Dorthy Magnussen RN,BSN NCM [phone] P/O ILEUS RESOLVING,TNA,GT CLAMPING,OSTOMYCONTINUE TO MONITOR PROGRESS.[facility] ALREADY FOLLOWING FOR HHRN/PT/OT.  04/06/12 Maurina Fawaz RN,BSN NCM [phone] RECOMMEND PT/OT EVAL.  [id number] Davis,RN,BSN,CCM: pt in step down unit for  cardiac monitoring due to type and amount of surgerical interventions.  patient transferred to telem. floor early am of [id number]/  04/03/2012 Raynelle Bring BSN CCM [phone] CM spoke with patient and grand daughter. Pt is from home and plans to return to home in [place] where she will have support of her spouse, daughter, and grand daughter. Pt was independent prior to hospital admission. Plans are for surgery in AM-exploratory laP-cm WILL FOLLOW POST OP.

## 2012-04-06 NOTE — Progress Notes (Addendum)
PARENTERAL NUTRITION CONSULT NOTE - Follow Up  Pharmacy Consult for TNA  Indication: SBO 2/2 neoplasm  Allergies  Allergen Reactions  . Pindolol Swelling    Throat swelled up  . Iodine   . Shellfish Allergy   . Vibramycin (Doxycycline Calcium) Nausea Only  . Doxycycline Nausea Only   Patient Measurements: Height: 5\' 1"  (154.9 cm) Weight: 143 lb 8.3 oz (65.1 kg) (standing scale) IBW/kg (Calculated) : 47.8   Vital Signs: Temp: 97.9 F (36.6 C) (09/12 0438) Temp src: Oral (09/12 0438) BP: 114/74 mmHg (09/12 0438) Pulse Rate: 118  (09/12 0438) Intake/Output from previous day: 09/11 0701 - 09/12 0700 In: 1665.8 [I.V.:225.8; TPN:1440] Out: 865 [Urine:665; Drains:160; Stool:40] Intake/Output from this shift: Total I/O In: -  Out: 200 [Urine:200]  Labs:  [facility] 04/06/12 0713 04/05/12 0412  WBC 13.8* 19.5*  HGB 9.6* 11.4*  HCT 28.2* 34.6*  PLT 235 294  APTT -- --  INR -- --     Basename 04/06/12 0713 04/05/12 0412  NA 125* 125*  K 4.5 4.8  CL 95* 97  CO2 26 24  GLUCOSE 129* 108*  BUN 25* 24*  CREATININE 0.65 0.68  LABCREA -- --  CREAT24HRUR -- --  CALCIUM 8.4 8.5  MG 2.0 --  PHOS 2.6 --  PROT 4.6* --  ALBUMIN 1.7* --  AST 19 --  ALT 10 --  ALKPHOS 85 --  BILITOT 0.3 --  BILIDIR -- --  IBILI -- --  PREALBUMIN -- --  TRIG -- --  CHOLHDL -- --  CHOL -- --   Estimated Creatinine Clearance: 46 ml/min (by C-G formula based on Cr of 0.65).    Basename 04/06/12 0755 04/06/12 0532 04/05/12 2204  GLUCAP 142* 118* 121*   CBGs & Insulin requirements past 24 hours:   CBGs < 150, received 7 units of sensitive SSI.  Nutritional Goals:   9/4 RD recs: Kcal 1650-1850, Protein: 65-75g, Fluid > 1.7L  Clinimix E 5/20 @ 60 ml/hr + IVF 20% on MWF will provide 72g protein/day, 1746 Kcal on MWF and 1267 Kcal on other days for an average of 1473 Kcal per day of the week.  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  Patient developed small bowel obstruction 2/2 neoplasm and without PO intake x 1 week.  TNA started 9/3.   POD#2 s/p ex lap with jejunal bypass, descending colostomy. G-tube placed in OR. 160 ml/ output 9/11. Continue TNA, awaiting resolution of ileus per surgery.  No history of DM - CBGs at goal <150   Electrolytes: Na low (unable to correct in premixed TNA), other lytes WNL  Renal/hepatic function: wnl   Pre-Albumin: 20.2 (9/9), 6.5(9/4)  TG/Cholesterol: wnl (9/9)  Plan:    Continue Clinimix E 5/20 at 60 ml/hr. (goal rate)  TNA to contain IV fat emulsion, standard multivitamins and trace elements only on MWF only due to ongoing shortage.  Continue sensitive SSI to q8h.  TNA labs Monday/Thursdays.  F/U post-op nutrition plans   Geoffry Paradise, PharmD, BCPS Pager: [phone] 9:46 AM Pharmacy #: [phone]

## 2012-04-06 NOTE — Progress Notes (Signed)
OT Cancellation Note  Treatment cancelled today due to patient's refusal to participate. Will check back another day. Pt states she has been up several times and is worn out.  Jame Seelig A OTR/L [phone] 04/06/2012, 3:09 PM

## 2012-04-06 NOTE — Progress Notes (Signed)
Patient ID: Victoria [name], female   DOB: October 28, 1926, 78 y.o.   MRN: [id number]  General Surgery - [facility] - Progress Note  POD# 2  Subjective: Patient sleeping comfortably.  Awakens to voice.  Family at bedside.  No complaints except dry mouth.  Objective: Vital signs in last 24 hours: Temp:  [97.7 F (36.5 C)-98.9 F (37.2 C)] 97.9 F (36.6 C) (09/12 0438) Pulse Rate:  [71-118] 118  (09/12 0438) Resp:  [11-19] 19  (09/12 0735) BP: (102-124)/(51-76) 114/74 mmHg (09/12 0438) SpO2:  [96 %-98 %] 97 % (09/12 0735) Weight:  [143 lb 8.3 oz (65.1 kg)-145 lb 11.6 oz (66.1 kg)] 143 lb 8.3 oz (65.1 kg) (09/12 0438) Last BM Date: 04/05/12  Intake/Output from previous day: 09/11 0701 - 09/12 0700 In: 1665.8 [I.V.:225.8; TPN:1440] Out: 865 [Urine:665; Drains:160; Stool:40]  Exam: HEENT - clear, not icteric Neck - soft Chest - clear bilaterally Cor - RRR, no murmur Abd - soft, mild distension; dressings dry and intact; thin bilious from G-tube to drainage; stoma soft, viable, minimal output Ext - no significant edema Neuro - grossly intact, no focal deficits  Lab Results:   [facility] 04/06/12 0713 04/05/12 0412  WBC 13.8* 19.5*  HGB 9.6* 11.4*  HCT 28.2* 34.6*  PLT 235 294     Basename 04/06/12 0713 04/05/12 0412  NA 125* 125*  K 4.5 4.8  CL 95* 97  CO2 26 24  GLUCOSE 129* 108*  BUN 25* 24*  CREATININE 0.65 0.68  CALCIUM 8.4 8.5    Studies/Results: No results found.  Assessment / Plan: 1.  Status post ex lap with jejunal bypass, descending colostomy for obstructive tumor  - TNA, IVF, G-tube to drainage  - will allow ice chips and sips of water today  - OOB, ambulate  - await resolution of ileus  Velora Heckler, MD, [facility] Office: [phone]  04/06/2012

## 2012-04-07 DIAGNOSIS — E871 Hypo-osmolality and hyponatremia: Secondary | ICD-10-CM

## 2012-04-07 DIAGNOSIS — E46 Unspecified protein-calorie malnutrition: Secondary | ICD-10-CM

## 2012-04-07 LAB — GLUCOSE, CAPILLARY
Glucose-Capillary: 113 mg/dL — ABNORMAL HIGH (ref 70–99)
Glucose-Capillary: 114 mg/dL — ABNORMAL HIGH (ref 70–99)
Glucose-Capillary: 113 mg/dL — ABNORMAL HIGH (ref 70–99)

## 2012-04-07 LAB — CBC
MCV: 85.9 fL (ref 78.0–100.0)
Platelets: 250 10*3/uL (ref 150–400)
RBC: 3.27 MIL/uL — ABNORMAL LOW (ref 3.87–5.11)
RDW: 14.6 % (ref 11.5–15.5)
WBC: 10.8 10*3/uL — ABNORMAL HIGH (ref 4.0–10.5)

## 2012-04-07 MED ORDER — SODIUM CHLORIDE 0.9 % IV SOLN
INTRAVENOUS | Status: DC
  Administered 2012-04-07 – 2012-04-12 (×5): via INTRAVENOUS

## 2012-04-07 MED ORDER — FAT EMULSION 20 % IV EMUL
240.0000 mL | INTRAVENOUS | Status: AC
  Administered 2012-04-07: 240 mL via INTRAVENOUS
  Filled 2012-04-07: qty 250

## 2012-04-07 MED ORDER — ZINC TRACE METAL 1 MG/ML IV SOLN
INTRAVENOUS | Status: AC
  Administered 2012-04-07: 18:00:00 via INTRAVENOUS
  Filled 2012-04-07: qty 2000

## 2012-04-07 NOTE — Progress Notes (Signed)
Patient ID: Victoria [name], female   DOB: 19-Oct-1926, 78 y.o.   MRN: [id number] 3 Days Post-Op  Subjective: Pt feels well today and in good spirits.  Reminded [place] all it was "Friday the 13th" though, (as a joke :) ) No output in ostomy Objective: Vital signs in last 24 hours: Temp:  [97.3 F (36.3 C)-98 F (36.7 C)] 97.8 F (36.6 C) (09/13 0635) Pulse Rate:  [105-118] 105  (09/13 0635) Resp:  [16-24] 24  (09/13 0800) BP: (100-113)/(69-74) 106/73 mmHg (09/13 0635) SpO2:  [93 %-98 %] 93 % (09/13 0800) FiO2 (%):  [96 %-97 %] 97 % (09/13 0347) Last BM Date: 04/05/12  Intake/Output from previous day: 09/12 0701 - 09/13 0700 In: 2125 [I.V.:237.8; JYN:8295.6] Out: 1525 [Urine:1025; Drains:500] Intake/Output this shift: Total I/O In: 20 [P.O.:20] Out: -   PE: Abd: soft, -BS, ND, ostomy with no air or stool.  Stoma pink and viable.  Wound is clean and packed.  Lab Results:   Basename 04/06/12 0713 04/05/12 0412  WBC 13.8* 19.5*  HGB 9.6* 11.4*  HCT 28.2* 34.6*  PLT 235 294   BMET  Basename 04/06/12 0713 04/05/12 0412  NA 125* 125*  K 4.5 4.8  CL 95* 97  CO2 26 24  GLUCOSE 129* 108*  BUN 25* 24*  CREATININE 0.65 0.68  CALCIUM 8.4 8.5   PT/INR No results found for this basename: LABPROT:2,INR:2 in the last 72 hours CMP     Component Value Date/Time   NA 125* 04/06/2012 0713   K 4.5 04/06/2012 0713   CL 95* 04/06/2012 0713   CO2 26 04/06/2012 0713   GLUCOSE 129* 04/06/2012 0713   BUN 25* 04/06/2012 0713   CREATININE 0.65 04/06/2012 0713   CALCIUM 8.4 04/06/2012 0713   PROT 4.6* 04/06/2012 0713   ALBUMIN 1.7* 04/06/2012 0713   AST 19 04/06/2012 0713   ALT 10 04/06/2012 0713   ALKPHOS 85 04/06/2012 0713   BILITOT 0.3 04/06/2012 0713   GFRNONAA 80* 04/06/2012 0713   GFRAA >90 04/06/2012 0713   Lipase  No results found for this basename: lipase       Studies/Results: No results found.  Anti-infectives: Anti-infectives     Start     Dose/Rate Route Frequency Ordered  Stop   04/04/12 0800   cefOXitin (MEFOXIN) 2 g in dextrose 5 % 50 mL IVPB        2 g 100 mL/hr over 30 Minutes Intravenous  Once 04/04/12 0734 04/04/12 0946           Assessment/Plan  1. S/p ex lap with SB bypass, colostomy, and placement of g-tube 2. Hyponatremia 3. Pelvic masses 4. PCM/TNA 5. Post op ileus  Plan: 1. Will add 40cc/hr NS to help with sodium  2. Cont TNA until patient can eat 3.  Await bowel function 4. Cont g-tube to gravity for now 5. Get OOB and ambulate, PT ordered.   LOS: 14 days    Victoria [name] E 04/07/2012, 9:22 AM Pager: [phone]

## 2012-04-07 NOTE — Progress Notes (Signed)
Physical Therapy Evaluation Patient Details Name: Victoria [name] MRN: [id number] DOB: 1926-11-03 Today's Date: 04/07/2012 Time: 7829-5621 PT Time Calculation (min): 30 min  PT Assessment / Plan / Recommendation Clinical Impression  78 y.o. female admitted to [facility] (Victoria [name]) S/p ex lap with SB bypass, colostomy, and placement of g-tube.  She has post-op illeus.  She presents today with decreased endurance, decreased bil leg strength and difficulty walking.      PT Assessment  Patient needs continued PT services    Follow Up Recommendations  Home health PT    Barriers to Discharge        Equipment Recommendations  None recommended by PT    Recommendations for Other Services     Frequency Min 3X/week    Precautions / Restrictions Precautions Precaution Comments: multiple lines Restrictions Weight Bearing Restrictions: No   Pertinent Vitals/Pain No reports of pain, HR increased to 120 bpm with activity      Mobility  Bed Mobility Bed Mobility: Supine to Sit;Sitting - Scoot to Edge of Bed Supine to Sit: 4: Min assist;HOB elevated;With rails Sitting - Scoot to Edge of Bed: 4: Min assist Details for Bed Mobility Assistance: min assist to pull up to sitting and to help weight shift OOB to chair.   Transfers Transfers: Sit to Stand;Stand to Sit Sit to Stand: 4: Min assist;With upper extremity assist;From bed Stand to Sit: 4: Min assist;With upper extremity assist;To chair/3-in-1;With armrests Details for Transfer Assistance: min assist to help support trunk to get to standing and control descent to sit back down.   Ambulation/Gait Ambulation/Gait Assistance: 4: Min assist Ambulation Distance (Feet): 200 Feet Assistive device: Other (Comment);1 person hand held assist (IV pole) Ambulation/Gait Assistance Details: ambulated holding IV pole and her son's hand.  Good upright posture, slow gait speed.  Reported leg fatigue before gatting all the way to the end of the hall and had to turn to  come back.   Gait Pattern: Step-through pattern;Shuffle Gait velocity: < 1.8 ft/sec which puts her at risk for recurrent falls.      Exercises General Exercises - Lower Extremity Ankle Circles/Pumps: AROM;Both;10 reps;Supine Quad Sets: AROM;Both;10 reps;Supine Gluteal Sets: AROM;Both;10 reps;Supine Heel Slides: AROM;Both;10 reps;Supine Hip ABduction/ADduction: AROM;Both;10 reps;Supine (abduction actively, adduction against pillow for resistance) Straight Leg Raises: AROM;10 reps;Supine   PT Diagnosis: Difficulty walking;Abnormality of gait;Generalized weakness  PT Problem List: Decreased strength;Decreased activity tolerance;Decreased balance;Decreased mobility;Decreased knowledge of use of DME PT Treatment Interventions: DME instruction;Gait training;Stair training;Functional mobility training;Therapeutic activities;Therapeutic exercise;Balance training;Patient/family education   PT Goals Acute Rehab PT Goals PT Goal Formulation: With patient Time For Goal Achievement: 04/21/12 Potential to Achieve Goals: Good Pt will go Supine/Side to Sit: with modified independence;with HOB 0 degrees PT Goal: Supine/Side to Sit - Progress: Goal set today Pt will go Sit to Supine/Side: with modified independence;with HOB 0 degrees PT Goal: Sit to Supine/Side - Progress: Goal set today Pt will go Sit to Stand: with modified independence PT Goal: Sit to Stand - Progress: Goal set today Pt will go Stand to Sit: with modified independence PT Goal: Stand to Sit - Progress: Goal set today Pt will Ambulate: >150 feet;with modified independence;with least restrictive assistive device PT Goal: Ambulate - Progress: Goal set today Pt will Go Up / Down Stairs: 3-5 stairs;with modified independence;with least restrictive assistive device PT Goal: Up/Down Stairs - Progress: Goal set today  Visit Information  Last PT Received On: 04/07/12 Assistance Needed: +2 (for lines only)    Subjective Data  Subjective: Pt reports it feels good to be up and walking.   Patient Stated Goal: to go home.     Prior Functioning  Home Living Lives With: Spouse Available Help at Discharge: Family;Available 24 hours/day Type of Home: House Home Access: Stairs to enter [employer] of Steps: 3 Entrance Stairs-Rails: Right Home Layout: One level (one 4 inch step to get down into the kitchen) Bathroom Shower/Tub: Walk-in shower;Door [employer] Toilet: Handicapped height Home Adaptive Equipment: Shower chair without back;Straight cane Additional Comments: Pt started using a cane a week ago when she started feeling ill, but before that she did not need an assistive device.   Prior Function Level of Independence: Independent Able to Take Stairs?: Yes Driving: Yes Vocation: Retired Comments: very social, eats out with friends from church 3xs/wk Communication Communication: No difficulties    Cognition  Overall Cognitive Status: Appears within functional limits for tasks assessed/performed Arousal/Alertness: Awake/alert    Extremity/Trunk Assessment Right Lower Extremity Assessment RLE ROM/Strength/Tone: Deficits RLE ROM/Strength/Tone Deficits: functionally pt's legs fatigued with gait.  At least 3/5 througout Left Lower Extremity Assessment LLE ROM/Strength/Tone: Deficits LLE ROM/Strength/Tone Deficits: functionally pt's legs fatigued with gait.  At least 3/5 througout   Balance Balance Balance Assessed: Yes Static Sitting Balance Static Sitting - Balance Support: Right upper extremity supported;Feet supported Static Sitting - Level of Assistance: 5: Stand by assistance Static Sitting - Comment/# of Minutes: 5 mins EOB to "let everything settle"  End of Session PT - End of Session Activity Tolerance: Patient limited by fatigue Patient left: in chair;with call bell/phone within reach       Chalfant B. Graeme Menees, PT, DPT [phone]   04/07/2012, 11:46 AM

## 2012-04-07 NOTE — Progress Notes (Signed)
PARENTERAL NUTRITION CONSULT NOTE - Follow Up  Pharmacy Consult for TNA  Indication: SBO 2/2 neoplasm  Allergies  Allergen Reactions  . Pindolol Swelling    Throat swelled up  . Iodine   . Shellfish Allergy   . Vibramycin (Doxycycline Calcium) Nausea Only  . Doxycycline Nausea Only   Patient Measurements: Height: 5\' 1"  (154.9 cm) Weight: 143 lb 8.3 oz (65.1 kg) (standing scale) IBW/kg (Calculated) : 47.8   Vital Signs: Temp: 97.8 F (36.6 C) (09/13 0635) Temp src: Oral (09/13 0635) BP: 106/73 mmHg (09/13 0635) Pulse Rate: 105  (09/13 0635) Intake/Output from previous day: 09/12 0701 - 09/13 0700 In: 2125 [I.V.:237.8; JYN:8295.6] Out: 1525 [Urine:1025; Drains:500] Intake/Output from this shift: Total I/O In: 20 [P.O.:20] Out: -   Labs:  [facility] 04/06/12 0713 04/05/12 0412  WBC 13.8* 19.5*  HGB 9.6* 11.4*  HCT 28.2* 34.6*  PLT 235 294  APTT -- --  INR -- --     Basename 04/06/12 0713 04/05/12 0412  NA 125* 125*  K 4.5 4.8  CL 95* 97  CO2 26 24  GLUCOSE 129* 108*  BUN 25* 24*  CREATININE 0.65 0.68  LABCREA -- --  CREAT24HRUR -- --  CALCIUM 8.4 8.5  MG 2.0 --  PHOS 2.6 --  PROT 4.6* --  ALBUMIN 1.7* --  AST 19 --  ALT 10 --  ALKPHOS 85 --  BILITOT 0.3 --  BILIDIR -- --  IBILI -- --  PREALBUMIN -- --  TRIG -- --  CHOLHDL -- --  CHOL -- --   Estimated Creatinine Clearance: 46 ml/min (by C-G formula based on Cr of 0.65).    Basename 04/07/12 0609 04/07/12 0014 04/06/12 1654  GLUCAP 114* 113* 115*   CBGs & Insulin requirements past 24 hours:   CBGs < 150, received 2 units of sensitive SSI.  Nutritional Goals:   9/4 RD recs: Kcal 1650-1850, Protein: 65-75g, Fluid > 1.7L  Clinimix E 5/20 @ 60 ml/hr + IVF 20% on MWF will provide 72g protein/day, 1746 Kcal on MWF and 1267 Kcal on other days for an average of 1473 Kcal per day of the week.  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  Patient developed small bowel obstruction 2/2 neoplasm and without PO intake x 1 week.  TNA started 9/3.   POD#3 s/p ex lap with jejunal bypass, descending colostomy. G-tube placed in OR. No output in ostomy today, MD plans continue TNA until patient can eat.  Hoping to wean soon.  No history of DM - CBGs at goal <150   No labs 9/13  Electrolytes: Na low (unable to correct in premixed TNA - MD added NS@40ml /min to help), other lytes WNL  Renal/hepatic function: wnl   Pre-Albumin: 20.2 (9/9), 6.5(9/4)  TG/Cholesterol: wnl (9/9)  Plan:    Continue Clinimix E 5/20 at 60 ml/hr. (goal rate)  TNA to contain IV fat emulsion, standard multivitamins and trace elements only on MWF only due to ongoing shortage.  Continue sensitive SSI to q8h.  TNA labs Monday/Thursdays.  F/u weaning   Geoffry Paradise, PharmD, BCPS Pager: [phone] 10:06 AM Pharmacy #: [phone]

## 2012-04-07 NOTE — Progress Notes (Signed)
Nutrition Follow-up  Intervention:  1. TNA per pharmacy, currently meeting 100% of estimated energy and protein needs.  2. RD to follow for nutrition plan of care.   Assessment:   Patient reported she is feeling well. She reported she has been sucking on some ice cubes. Per MD, wound clean and no ostomy out-put. Patient currently receiving Clinimix E 5/20 @ 60 ml/hr plus lipids 20% 10 ml/hr MWF, weekly average provides 1472 kcal and 72 grams of protein. (Meets 100% of estimated energy and protein needs).   Diet Order:  TNA Clinimix E 5/20 @ 60 ml/hr plus lipids 20% 10 ml/hr MWF  Meds: Scheduled Meds:   . enoxaparin  40 mg Subcutaneous Q24H  . insulin aspart  0-9 Units Subcutaneous Q8H  . metoprolol  2.5 mg Intravenous Q6H  . morphine   Intravenous Q4H   Continuous Infusions:   . sodium chloride 40 mL/hr at 04/07/12 1026  . diltiazem (CARDIZEM) infusion 10 mg/hr (04/07/12 0751)  . fat emulsion 240 mL (04/05/12 1737)  . fat emulsion    . TPN (CLINIMIX) +/- additives 60 mL/hr at 04/05/12 2100  . TPN (CLINIMIX) +/- additives    . DISCONTD: TPN (CLINIMIX) +/- additives 60 mL/hr at 04/06/12 1719   PRN Meds:.acetaminophen, acetaminophen, diphenhydrAMINE, diphenhydrAMINE, metoprolol, naloxone, ondansetron (ZOFRAN) IV, ondansetron (ZOFRAN) IV, phenol, promethazine, sodium chloride, sodium chloride  Labs:  CMP     Component Value Date/Time   NA 125* 04/06/2012 0713   K 4.5 04/06/2012 0713   CL 95* 04/06/2012 0713   CO2 26 04/06/2012 0713   GLUCOSE 129* 04/06/2012 0713   BUN 25* 04/06/2012 0713   CREATININE 0.65 04/06/2012 0713   CALCIUM 8.4 04/06/2012 0713   PROT 4.6* 04/06/2012 0713   ALBUMIN 1.7* 04/06/2012 0713   AST 19 04/06/2012 0713   ALT 10 04/06/2012 0713   ALKPHOS 85 04/06/2012 0713   BILITOT 0.3 04/06/2012 0713   GFRNONAA 80* 04/06/2012 0713   GFRAA >90 04/06/2012 0713     Intake/Output Summary (Last 24 hours) at 04/07/12 1356 Last data filed at 04/07/12 1200  Gross per 24 hour    Intake 2077.5 ml  Output   1725 ml  Net  352.5 ml   Wt Readings from Last 10 Encounters:  04/06/12 143 lb 8.3 oz (65.1 kg)  04/06/12 143 lb 8.3 oz (65.1 kg)  03/24/12 139 lb 9.6 oz (63.322 kg)  03/20/12 133 lb 13.1 oz (60.7 kg)  03/15/12 134 lb 4.8 oz (60.918 kg)  03/13/12 132 lb (59.875 kg)  03/09/12 132 lb 6.4 oz (60.056 kg)  03/08/12 132 lb (59.875 kg)    Weight Status:  143 lb.  *Weight fluctuations likely partially due to changes in fluid status.   Re-estimated needs:  1431-1670 kcal and 71-85 grams   Nutrition Dx:  Inadequate Oral Intake, Ongoing.   Goal:  1. Diet advancements as medically able. 2. Meet > 90% of estimated energy needs with nutrition support. -Meeting, continue.   Monitor:  TNA per pharmacy, diet advancements, weights, labs   Adron Bene [phone]

## 2012-04-07 NOTE — Progress Notes (Signed)
Pt. Seen and examined. Agree with the NP/PA-C note as written.  Continue IV rate control meds which are adequate at this time until she can adequately tolerate and absorb po meds.  Chrystie Nose, MD, [facility] Attending Cardiologist [facility]

## 2012-04-07 NOTE — Plan of Care (Signed)
Problem: Inadequate Intake (NI-2.1) Goal: Food and/or nutrient delivery Individualized approach for food/nutrient provision.  Outcome: Progressing TNA at goal.

## 2012-04-07 NOTE — Evaluation (Signed)
Occupational Therapy Evaluation Patient Details Name: JOYCE HEITMAN MRN: [id number] DOB: [birth year] Today's Date: 04/07/2012 Time: 4098-1191 OT Time Calculation (min): 24 min  OT Assessment / Plan / Recommendation Clinical Impression  78 y.o. female admitted to [facility] S/p ex lap with SB bypass, colostomy, and placement of g-tube.  She has post-op illeus. Skilled OT recommended to maximize independence with BADLs to mod I level in prep for safe d/c home with HHOT.    OT Assessment  Patient needs continued OT Services    Follow Up Recommendations  Home health OT    Barriers to Discharge      Equipment Recommendations  None recommended by OT    Recommendations for Other Services    Frequency  Min 2X/week    Precautions / Restrictions Precautions Precaution Comments: multiple lines and leads.   Pertinent Vitals/Pain O2 on RA following activity 94. HR 113    ADL  Grooming: Performed;Teeth care;Wash/dry hands;Min guard Where Assessed - Grooming: Unsupported sitting Upper Body Bathing: Simulated;Set up Where Assessed - Upper Body Bathing: Unsupported sitting Lower Body Bathing: Simulated;Minimal assistance Where Assessed - Lower Body Bathing: Supported sit to stand Upper Body Dressing: Simulated;Set up Where Assessed - Upper Body Dressing: Unsupported sitting Lower Body Dressing: Simulated;Moderate assistance Where Assessed - Lower Body Dressing: Supported sit to Pharmacist, hospital: Performed;Minimal Dentist Method: Sit to Barista: Regular height toilet Toileting - Clothing Manipulation and Hygiene: Performed;Min guard Where Assessed - Toileting Clothing Manipulation and Hygiene: Sit to stand from 3-in-1 or toilet Transfers/Ambulation Related to ADLs: Pt ambulated to the bathroom pushing  IV pole.    OT Diagnosis: Generalized weakness  OT Problem List: Decreased activity tolerance;Decreased knowledge of use of DME or AE OT  Treatment Interventions: Self-care/ADL training;Therapeutic activities;DME and/or AE instruction;Patient/family education   OT Goals Acute Rehab OT Goals OT Goal Formulation: With patient/family Time For Goal Achievement: 04/21/12 Potential to Achieve Goals: Good ADL Goals Pt Will Perform Grooming: with modified independence;Standing at sink ADL Goal: Grooming - Progress: Goal set today Pt Will Transfer to Toilet: with modified independence;Ambulation;Regular height toilet ADL Goal: Toilet Transfer - Progress: Goal set today Pt Will Perform Toileting - Clothing Manipulation: with modified independence;Sitting on 3-in-1 or toilet;Standing ADL Goal: Toileting - Clothing Manipulation - Progress: Goal set today Pt Will Perform Toileting - Hygiene: with modified independence;Sit to stand from 3-in-1/toilet ADL Goal: Toileting - Hygiene - Progress: Goal set today Pt Will Perform Tub/Shower Transfer: Shower transfer;with modified independence;Ambulation ADL Goal: Tub/Shower Transfer - Progress: Goal set today Additional ADL Goal #1: Pt will complete all aspects of bathing and dressing including gathering necessary ADL items with mod I. ADL Goal: Additional Goal #1 - Progress: Goal set today  Visit Information  Last OT Received On: 04/07/12 Assistance Needed: +2 (for lines and tubes only.)    Subjective Data  Subjective: It feels so good to brush my teeth and pee in the toilet. Patient Stated Goal: Return home with family assist   Prior Functioning  Vision/Perception  Home Living Lives With: Spouse Available Help at Discharge: Family;Available 24 hours/day Type of Home: House Home Access: Stairs to enter [employer] of Steps: 3 Entrance Stairs-Rails: Right Home Layout: One level;Other (Comment) (4 inch step to get down into the kitchen.) Bathroom Shower/Tub: Health visitor: Handicapped height Home Adaptive Equipment: Shower chair with back;Straight  cane Additional Comments: Pt started using a cane ~ a week ago but before that did not need an AD. Prior  Function Level of Independence: Independent Able to Take Stairs?: Yes Driving: Yes Vocation: Retired Comments: very social, eats out with friends from church 3xs/wk Communication Communication: No difficulties Dominant Hand: Right      Cognition  Overall Cognitive Status: Appears within functional limits for tasks assessed/performed Arousal/Alertness: Awake/alert Orientation Level: Appears intact for tasks assessed Behavior During Session: [facility] for tasks performed    Extremity/Trunk Assessment Right Upper Extremity Assessment RUE ROM/Strength/Tone: [facility] for tasks assessed Left Upper Extremity Assessment LUE ROM/Strength/Tone: WFL for tasks assessed   Mobility  Shoulder Instructions  Bed Mobility  Sit to Supine: 4: Min assist;HOB flat;With rail Details for Bed Mobility Assistance: min A needed for LEs. Transfers Sit to Stand: 4: Min assist;With upper extremity assist;With armrests;From chair/3-in-1;From toilet Stand to Sit: 4: Min assist;With upper extremity assist;To chair/3-in-1;To toilet;To bed Details for Transfer Assistance: Min VCs for hand placement and safety.       Exercise    Balance Balance Balance Assessed: Yes Static Sitting Balance Static Sitting - Balance Support: Right upper extremity supported;Feet supported Static Sitting - Level of Assistance: 5: Stand by assistance Static Sitting - Comment/# of Minutes: 5 mins EOB to "let everything settle"   End of Session OT - End of Session Activity Tolerance: Patient tolerated treatment well Patient left: in bed;with call bell/phone within reach  GO     Vara Mairena A OTR/L [phone] 04/07/2012, 2:58 PM

## 2012-04-07 NOTE — Progress Notes (Signed)
Subjective: No specific complaints, aware of all of her tubes.  Good spirits.  Excited to have walked twice today.  Objective: Vital signs in last 24 hours: Temp:  [97.3 F (36.3 C)-97.8 F (36.6 C)] 97.8 F (36.6 C) (09/13 0635) Pulse Rate:  [90-114] 90  (09/13 1118) Resp:  [16-24] 20  (09/13 1543) BP: (100-116)/(69-86) 116/86 mmHg (09/13 1118) SpO2:  [93 %-98 %] 98 % (09/13 1543) FiO2 (%):  [96 %-97 %] 97 % (09/13 0347) Weight change:  Last BM Date: 04/05/12 Intake/Output from previous day: +1160 09/12 0701 - 09/13 0700 In: 2125 [I.V.:237.8; WUJ:8119.1] Out: 1525 [Urine:1025; Drains:500] Intake/Output this shift: Total I/O In: 1143.5 [P.O.:20; I.V.:321.5; TPN:802] Out: 600 [Urine:200; Drains:400]  PE: General:alert oriented, bright affect Heart: S1S2 irreg irreg no murmur or gallup Lungs:clear without rales, rhonchi or wheezes Abd:- BS, soft + ostomy with drainage Ext:no edema    Lab Results:  [facility] 04/07/12 1010 04/06/12 0713  WBC 10.8* 13.8*  HGB 9.7* 9.6*  HCT 28.1* 28.2*  PLT 250 235   BMET  Basename 04/06/12 0713 04/05/12 0412  NA 125* 125*  K 4.5 4.8  CL 95* 97  CO2 26 24  GLUCOSE 129* 108*  BUN 25* 24*  CREATININE 0.65 0.68  CALCIUM 8.4 8.5   No results found for this basename: TROPONINI:2,CK,MB:2 in the last 72 hours  Lab Results  Component Value Date   CHOL 93 04/03/2012   HDL 43 05/15/2008   LDLCALC  Value: 82        Total Cholesterol/HDL:CHD Risk Coronary Heart Disease Risk Table                     Men   Women  1/2 Average Risk   3.4   3.3 05/15/2008   TRIG 118 04/03/2012   CHOLHDL 3.3 05/15/2008   No results found for this basename: HGBA1C       Hepatic Function Panel  Basename 04/06/12 0713  PROT 4.6*  ALBUMIN 1.7*  AST 19  ALT 10  ALKPHOS 85  BILITOT 0.3  BILIDIR --  IBILI --    EKG: Orders placed during the hospital encounter of 03/24/12  . EKG 12-LEAD  . EKG 12-LEAD  . EKG 12-LEAD  . EKG 12-LEAD  . EKG 12-LEAD  .  EKG 12-LEAD  . EKG 12-LEAD  . EKG 12-LEAD  . EKG 12-LEAD  . EKG 12-LEAD  . ED EKG  . ED EKG    Studies/Results: No results found.  Medications: I have reviewed the patient's current medications.    . enoxaparin  40 mg Subcutaneous Q24H  . insulin aspart  0-9 Units Subcutaneous Q8H  . metoprolol  2.5 mg Intravenous Q6H  . morphine   Intravenous Q4H   Assessment/Plan: Principal Problem:  *Small bowel obstruction, surgery 04/04/12 Active Problems:  Rectal carcinoma  PAF fib/flutter- Hx RFA 2010  Chronic anticoagulation  S/P [facility] Feb 2006 after failed RFA  Hyponatremia  Pelvic mass in female, necrotic  PLAN: Continues on IV dilt drip, and IV metoprolol 2.5 mg Q 6 hours.  Only taking ice chips now, but doing well with those.   LOS: 14 days   Victoria [name] R 04/07/2012, 4:47 PM

## 2012-04-07 NOTE — Progress Notes (Signed)
Doing well, lungs clear, heart irreg Cont tna, await expected ileus to resolve, agree with above, Check bmet am lovenox scds

## 2012-04-08 LAB — CBC
Hemoglobin: 9.2 g/dL — ABNORMAL LOW (ref 12.0–15.0)
MCHC: 34.3 g/dL (ref 30.0–36.0)
RBC: 3.11 MIL/uL — ABNORMAL LOW (ref 3.87–5.11)
WBC: 10 10*3/uL (ref 4.0–10.5)

## 2012-04-08 LAB — GLUCOSE, CAPILLARY
Glucose-Capillary: 115 mg/dL — ABNORMAL HIGH (ref 70–99)
Glucose-Capillary: 120 mg/dL — ABNORMAL HIGH (ref 70–99)
Glucose-Capillary: 130 mg/dL — ABNORMAL HIGH (ref 70–99)

## 2012-04-08 LAB — BASIC METABOLIC PANEL
Chloride: 92 mEq/L — ABNORMAL LOW (ref 96–112)
GFR calc non Af Amer: 85 mL/min — ABNORMAL LOW (ref >90)
Glucose, Bld: 103 mg/dL — ABNORMAL HIGH (ref 70–99)
Potassium: 3.8 mEq/L (ref 3.5–5.1)
Sodium: 125 mEq/L — ABNORMAL LOW (ref 135–145)

## 2012-04-08 MED ORDER — MORPHINE SULFATE 2 MG/ML IJ SOLN
1.0000 mg | INTRAMUSCULAR | Status: DC | PRN
  Administered 2012-04-08: 1 mg via INTRAVENOUS
  Administered 2012-04-08: 2 mg via INTRAVENOUS
  Filled 2012-04-08 (×2): qty 1

## 2012-04-08 MED ORDER — CLINIMIX E/DEXTROSE (5/20) 5 % IV SOLN
INTRAVENOUS | Status: AC
  Filled 2012-04-08: qty 2000

## 2012-04-08 NOTE — Progress Notes (Signed)
4 Days Post-Op  Subjective: Comfortable.  Walked yesterday.  Objective: Vital signs in last 24 hours: Temp:  [97.8 F (36.6 C)-98.4 F (36.9 C)] 98.4 F (36.9 C) (09/14 0500) Pulse Rate:  [88-111] 105  (09/14 0500) Resp:  [18-20] 20  (09/14 0500) BP: (111-121)/(60-86) 111/74 mmHg (09/14 0500) SpO2:  [94 %-98 %] 94 % (09/14 0500) FiO2 (%):  [97 %] 97 % (09/14 0015) Last BM Date:  (colostomy)  Intake/Output from previous day: 09/13 0701 - 09/14 0700 In: 2927.5 [P.O.:20; I.V.:1064.8; TPN:1842.7] Out: 1675 [Urine:750; Drains:900; Stool:25] Intake/Output this shift:    PE: Abd-soft, open wound with some necrotic tissue, no gas or stool in bag, bilious g-tube output  Lab Results:   Basename 04/08/12 0500 04/07/12 1010  WBC 10.0 10.8*  HGB 9.2* 9.7*  HCT 26.8* 28.1*  PLT 267 250   BMET  Basename 04/08/12 0500 04/06/12 0713  NA 125* 125*  K 3.8 4.5  CL 92* 95*  CO2 27 26  GLUCOSE 103* 129*  BUN 17 25*  CREATININE 0.53 0.65  CALCIUM 8.3* 8.4   PT/INR No results found for this basename: LABPROT:2,INR:2 in the last 72 hours Comprehensive Metabolic Panel:    Component Value Date/Time   NA 125* 04/08/2012 0500   K 3.8 04/08/2012 0500   CL 92* 04/08/2012 0500   CO2 27 04/08/2012 0500   BUN 17 04/08/2012 0500   CREATININE 0.53 04/08/2012 0500   GLUCOSE 103* 04/08/2012 0500   CALCIUM 8.3* 04/08/2012 0500   AST 19 04/06/2012 0713   ALT 10 04/06/2012 0713   ALKPHOS 85 04/06/2012 0713   BILITOT 0.3 04/06/2012 0713   PROT 4.6* 04/06/2012 0713   ALBUMIN 1.7* 04/06/2012 0713     Studies/Results: No results found.  Anti-infectives: Anti-infectives     Start     Dose/Rate Route Frequency Ordered Stop   04/04/12 0800   cefOXitin (MEFOXIN) 2 g in dextrose 5 % 50 mL IVPB        2 g 100 mL/hr over 30 Minutes Intravenous  Once 04/04/12 0734 04/04/12 0946          Assessment Principal Problem:  *Small bowel obstruction, surgery 04/04/12 Active Problems:  Postop ileus  Rectal carcinoma  Malnutrition-on TPN    LOS: 15 days   Plan: Continue wound care, TPN.  Wait for ileus to resolve.   Victoria [name] J 04/08/2012

## 2012-04-08 NOTE — Progress Notes (Signed)
PARENTERAL NUTRITION CONSULT NOTE - Follow Up  Pharmacy Consult for TNA  Indication: SBO 2/2 neoplasm  Allergies  Allergen Reactions  . Pindolol Swelling    Throat swelled up  . Iodine   . Shellfish Allergy   . Vibramycin (Doxycycline Calcium) Nausea Only  . Doxycycline Nausea Only   Patient Measurements: Height: 5\' 1"  (154.9 cm) Weight: 143 lb 8.3 oz (65.1 kg) (standing scale) IBW/kg (Calculated) : 47.8   Vital Signs: Temp: 98.4 F (36.9 C) (09/14 0500) Temp src: Oral (09/14 0500) BP: 111/74 mmHg (09/14 0500) Pulse Rate: 105  (09/14 0500) Intake/Output from previous day: 09/13 0701 - 09/14 0700 In: 2927.5 [P.O.:20; I.V.:1064.8; TPN:1842.7] Out: 1675 [Urine:750; Drains:900; Stool:25] Intake/Output from this shift:    Labs:  Basename 04/08/12 0500 04/07/12 1010 04/06/12 0713  WBC 10.0 10.8* 13.8*  HGB 9.2* 9.7* 9.6*  HCT 26.8* 28.1* 28.2*  PLT 267 250 235  APTT -- -- --  INR -- -- --     Basename 04/08/12 0500 04/06/12 0713  NA 125* 125*  K 3.8 4.5  CL 92* 95*  CO2 27 26  GLUCOSE 103* 129*  BUN 17 25*  CREATININE 0.53 0.65  LABCREA -- --  CREAT24HRUR -- --  CALCIUM 8.3* 8.4  MG -- 2.0  PHOS -- 2.6  PROT -- 4.6*  ALBUMIN -- 1.7*  AST -- 19  ALT -- 10  ALKPHOS -- 85  BILITOT -- 0.3  BILIDIR -- --  IBILI -- --  PREALBUMIN -- --  TRIG -- --  CHOLHDL -- --  CHOL -- --   Estimated Creatinine Clearance: 46 ml/min (by C-G formula based on Cr of 0.53).    Basename 04/08/12 0607 04/08/12 0011 04/07/12 1743  GLUCAP 115* 120* 113*   CBGs & Insulin requirements past 24 hours:   CBGs < 150, received 0 units of sensitive SSI.  Nutritional Goals:   RD re-estimated needs 9/13: 1431-1670 kcal and 71-85 grams   Clinimix E 5/20 @ 60 ml/hr + IVF 20% on MWF will provide 72g protein/day, 1746 Kcal on MWF and 1267 Kcal on other days for an average of 1473 Kcal per day of the week.  Current nutrition:   Clinimix E 5/20 at 44ml/hr + fat emulsion at 35ml/hr  on MWF  Diet: NPO except ice chips  IVF:  NS @40  ml/min added 9/13 to help with Na   Assessment:   78 yo F with rectal mass, s/p sigmoidoscopy 8/19 with pathology showing moderate to poorly differentiated adenocarcinoma, c/w a GYN primary.  Patient developed small bowel obstruction 2/2 neoplasm and without PO intake x 1 week.  TNA started 9/3.   POD#4 s/p ex lap with jejunal bypass, descending colostomy. G-tube placed in OR, 900 mL output recorded yesterday. MD plans continue TNA until patient can eat.  Hoping to wean soon.  No history of DM - CBGs at goal <150   Labs  Electrolytes: Na low (unable to correct in premixed TNA - MD added NS@40ml /min to help), other lytes WNL  Renal/hepatic function: wnl   Pre-Albumin: 20.2 (9/9), 6.5(9/4)  TG/Cholesterol: wnl (9/9)  Plan:    Continue Clinimix E 5/20 at 60 ml/hr. (goal rate)  TNA to contain IV fat emulsion, standard multivitamins and trace elements only on MWF only due to ongoing shortage.  Considering that CBGs have been stable at goal (<150 mg/dL) and pt has no hx of DM, will d/c SSI and CBG checks.  TNA labs Monday/Thursdays.  F/u weaning  Clance Boll, PharmD, BCPS Pager: [phone] 04/08/2012 7:29 AM

## 2012-04-08 NOTE — Progress Notes (Signed)
OOB, amb. In hall with son today x2. Toll well.  Cardiazem gtt. Continues at 10/ml hr.  Rate averaged 90's to low 100's today, A-fib/ A flutter. Abd. Dsg changed.  Open surgical areas x2 clean, raw with skin lightly pink at edges. Minimal serosang. Drainage on old dsg.  Wet to dry dressing reapplied. Toll. Well. Colostomy with light brown gel appearing drainage.  GT with green, bile appearing drainage.  Pt. Very pleasant and motivated to get up, move, do whatever is asked of her. Stated "I want to get better".

## 2012-04-09 MED ORDER — CLINIMIX E/DEXTROSE (5/20) 5 % IV SOLN
INTRAVENOUS | Status: AC
  Administered 2012-04-09: 18:00:00 via INTRAVENOUS
  Filled 2012-04-09: qty 2000

## 2012-04-09 NOTE — Progress Notes (Signed)
5 Days Post-Op  Subjective: Comfortable.  Sitting in the chair.  Objective: Vital signs in last 24 hours: Temp:  [98.1 F (36.7 C)-98.5 F (36.9 C)] 98.1 F (36.7 C) (09/15 0525) Pulse Rate:  [100-101] 100  (09/15 0525) Resp:  [20] 20  (09/15 0525) BP: (117-120)/(61-76) 120/76 mmHg (09/15 0525) SpO2:  [95 %-97 %] 97 % (09/15 0525) Weight:  [146 lb 1.6 oz (66.271 kg)] 146 lb 1.6 oz (66.271 kg) (09/15 0525) Last BM Date:  (colostomy)  Intake/Output from previous day: 09/14 0701 - 09/15 0700 In: 1981.8 [I.V.:850.8; TPN:1131] Out: 1980 [Urine:1500; Drains:450; Stool:30] Intake/Output this shift: Total I/O In: 845.2 [I.V.:384.2; TPN:461] Out: -   PE: Abd-soft, open wound has some stool in the lower portion that is leaking under the colostomy wafer, stool and gas in the colostomy bag, g-tube output is less bilious Lab Results:   Basename 04/08/12 0500 04/07/12 1010  WBC 10.0 10.8*  HGB 9.2* 9.7*  HCT 26.8* 28.1*  PLT 267 250   BMET  Basename 04/08/12 0500  NA 125*  K 3.8  CL 92*  CO2 27  GLUCOSE 103*  BUN 17  CREATININE 0.53  CALCIUM 8.3*   PT/INR No results found for this basename: LABPROT:2,INR:2 in the last 72 hours Comprehensive Metabolic Panel:    Component Value Date/Time   NA 125* 04/08/2012 0500   K 3.8 04/08/2012 0500   CL 92* 04/08/2012 0500   CO2 27 04/08/2012 0500   BUN 17 04/08/2012 0500   CREATININE 0.53 04/08/2012 0500   GLUCOSE 103* 04/08/2012 0500   CALCIUM 8.3* 04/08/2012 0500   AST 19 04/06/2012 0713   ALT 10 04/06/2012 0713   ALKPHOS 85 04/06/2012 0713   BILITOT 0.3 04/06/2012 0713   PROT 4.6* 04/06/2012 0713   ALBUMIN 1.7* 04/06/2012 0713     Studies/Results: No results found.  Anti-infectives: Anti-infectives     Start     Dose/Rate Route Frequency Ordered Stop   04/04/12 0800   cefOXitin (MEFOXIN) 2 g in dextrose 5 % 50 mL IVPB        2 g 100 mL/hr over 30 Minutes Intravenous  Once 04/04/12 0734 04/04/12 0946           Assessment Principal Problem:  *Small bowel obstruction, surgery 04/04/12 Active Problems:  Postop ileus-improving  Stool leaking into open portion of wound  Rectal carcinoma  Malnutrition-on TPN    LOS: 16 days   Plan: Change colostomy wafer.  If colostomy output remains good over next 24 hours, could clamp g-tube and try clear liquids.   Victoria [name] J 04/09/2012

## 2012-04-09 NOTE — Progress Notes (Signed)
Card gtt decreased to 96ml/hr at 8am, HR in 70s.  Pt amb in hall w/HR 120s and returned to 90s after rest.  Genia Harold

## 2012-04-09 NOTE — Progress Notes (Signed)
Pt ambulated in hall again, tolerated well but HR wants to stay 120-130 afterward, Card gtt increased to 47ml/hr.

## 2012-04-09 NOTE — Progress Notes (Signed)
PARENTERAL NUTRITION CONSULT NOTE - Follow Up  Pharmacy Consult for TNA  Indication: SBO 2/2 neoplasm  Allergies  Allergen Reactions  . Pindolol Swelling    Throat swelled up  . Iodine   . Shellfish Allergy   . Vibramycin (Doxycycline Calcium) Nausea Only  . Doxycycline Nausea Only   Patient Measurements: Height: 5\' 1"  (154.9 cm) Weight: 146 lb 1.6 oz (66.271 kg) IBW/kg (Calculated) : 47.8   Vital Signs: Temp: 98.1 F (36.7 C) (09/15 0525) Temp src: Oral (09/15 0525) BP: 120/76 mmHg (09/15 0525) Pulse Rate: 100  (09/15 0525) Intake/Output from previous day: 09/14 0701 - 09/15 0700 In: 1981.8 [I.V.:850.8; TPN:1131] Out: 1980 [Urine:1500; Drains:450; Stool:30] Intake/Output from this shift: Total I/O In: 845.2 [I.V.:384.2; TPN:461] Out: -   Labs:  Basename 04/08/12 0500 04/07/12 1010  WBC 10.0 10.8*  HGB 9.2* 9.7*  HCT 26.8* 28.1*  PLT 267 250  APTT -- --  INR -- --     Basename 04/08/12 0500  NA 125*  K 3.8  CL 92*  CO2 27  GLUCOSE 103*  BUN 17  CREATININE 0.53  LABCREA --  CREAT24HRUR --  CALCIUM 8.3*  MG --  PHOS --  PROT --  ALBUMIN --  AST --  ALT --  ALKPHOS --  BILITOT --  BILIDIR --  IBILI --  PREALBUMIN --  TRIG --  CHOLHDL --  CHOL --   Estimated Creatinine Clearance: 46.4 ml/min (by C-G formula based on Cr of 0.53).    Basename 04/09/12 0757 04/09/12 0533 04/08/12 2325  GLUCAP 138* 130* 130*   CBGs & Insulin requirements past 24 hours:   CBGs < 150, CBG checks and SSI d/c'd 9/14  Nutritional Goals:   RD re-estimated needs 9/13: 1431-1670 kcal and 71-85 grams   Clinimix E 5/20 @ 60 ml/hr + IVF 20% on MWF will provide 72g protein/day, 1746 Kcal on MWF and 1267 Kcal on other days for an average of 1473 Kcal per day of the week.  Current nutrition:   Clinimix E 5/20 at 49ml/hr + fat emulsion at 41ml/hr on MWF  Diet: NPO except ice chips  IVF:  NS @40  ml/min added 9/13 to help with Na   Assessment:   78 yo F with  rectal mass, s/p sigmoidoscopy 8/19 with pathology showing moderate to poorly differentiated adenocarcinoma, c/w a GYN primary.  Patient developed small bowel obstruction 2/2 neoplasm and without PO intake x 1 week.  TNA started 9/3.   POD#5 s/p ex lap with jejunal bypass, descending colostomy. G-tube placed in OR, 450 mL output recorded yesterday. Surgery notes post-op ileus and to continue TNA for malnutrition, waiting for ileus to resolve.  No history of DM - CBGs at goal <150, CBG checks and SSI have been d/c'd.  Labs   Electrolytes: Na low (unable to correct in premixed TNA - MD added NS@40ml /min to help), other lytes WNL 9/14  Renal/hepatic function: wnl   Pre-Albumin: 20.2 (9/9), 6.5(9/4)  TG/Cholesterol: wnl (9/9)  Plan:    Continue Clinimix E 5/20 at 60 ml/hr. (goal rate)  TNA to contain IV fat emulsion, standard multivitamins and trace elements only on MWF only due to ongoing shortage.  Deferring management of IVF to MD.  TNA labs Monday/Thursdays.  Clance Boll, PharmD, BCPS Pager: [phone] 04/09/2012 8:54 AM

## 2012-04-09 NOTE — Progress Notes (Signed)
Card gtt increased to 10cc/hr d/t HR sustained 110-120.  Will cont to monitor. Genia Harold

## 2012-04-10 LAB — GLUCOSE, CAPILLARY
Glucose-Capillary: 121 mg/dL — ABNORMAL HIGH (ref 70–99)
Glucose-Capillary: 129 mg/dL — ABNORMAL HIGH (ref 70–99)

## 2012-04-10 LAB — COMPREHENSIVE METABOLIC PANEL
ALT: 17 U/L (ref 0–35)
Alkaline Phosphatase: 222 U/L — ABNORMAL HIGH (ref 39–117)
BUN: 13 mg/dL (ref 6–23)
CO2: 27 mEq/L (ref 19–32)
Chloride: 94 mEq/L — ABNORMAL LOW (ref 96–112)
GFR calc Af Amer: >90 mL/min (ref >90)
Glucose, Bld: 112 mg/dL — ABNORMAL HIGH (ref 70–99)
Potassium: 3.7 mEq/L (ref 3.5–5.1)
Sodium: 128 mEq/L — ABNORMAL LOW (ref 135–145)
Total Bilirubin: 0.3 mg/dL (ref 0.3–1.2)

## 2012-04-10 LAB — DIFFERENTIAL
Basophils Relative: 0 % (ref 0–1)
Eosinophils Absolute: 0.1 10*3/uL (ref 0.0–0.7)
Eosinophils Relative: 1 % (ref 0–5)
Lymphs Abs: 1 10*3/uL (ref 0.7–4.0)
Monocytes Relative: 8 % (ref 3–12)
Neutrophils Relative %: 81 % — ABNORMAL HIGH (ref 43–77)

## 2012-04-10 LAB — TRIGLYCERIDES: Triglycerides: 80 mg/dL (ref <150)

## 2012-04-10 LAB — CBC
Hemoglobin: 9.5 g/dL — ABNORMAL LOW (ref 12.0–15.0)
MCH: 28.6 pg (ref 26.0–34.0)
MCHC: 33.7 g/dL (ref 30.0–36.0)
MCV: 84.9 fL (ref 78.0–100.0)
Platelets: 309 10*3/uL (ref 150–400)

## 2012-04-10 LAB — MAGNESIUM: Magnesium: 1.8 mg/dL (ref 1.5–2.5)

## 2012-04-10 MED ORDER — FAT EMULSION 20 % IV EMUL
250.0000 mL | INTRAVENOUS | Status: AC
  Administered 2012-04-10: 250 mL via INTRAVENOUS
  Filled 2012-04-10: qty 250

## 2012-04-10 MED ORDER — ZINC TRACE METAL 1 MG/ML IV SOLN
INTRAVENOUS | Status: AC
  Administered 2012-04-10: 18:00:00 via INTRAVENOUS
  Filled 2012-04-10: qty 2000

## 2012-04-10 MED ORDER — ALTEPLASE 2 MG IJ SOLR
2.0000 mg | Freq: Once | INTRAMUSCULAR | Status: AC
  Administered 2012-04-10: 2 mg
  Filled 2012-04-10 (×3): qty 2

## 2012-04-10 NOTE — Progress Notes (Signed)
tpa instilled into white port PICC - capped/taped. To leave x 1 hour. VSandritter RN/VABC

## 2012-04-10 NOTE — Progress Notes (Signed)
Occupational Therapy Treatment Patient Details Name: Victoria [name] MRN: [id number] DOB: [birth year] Today's Date: 04/10/2012 Time: 3295-1884 OT Time Calculation (min): 34 min  OT Assessment / Plan / Recommendation Comments on Treatment Session Pt doing well. No complaint of pain. tolerated toileting, grooming and bath with no fatigue reported.     Follow Up Recommendations  Home health OT    Barriers to Discharge       Equipment Recommendations  None recommended by OT    Recommendations for Other Services    Frequency Min 2X/week   Plan Discharge plan remains appropriate    Precautions / Restrictions Restrictions Weight Bearing Restrictions: No        ADL  Grooming: Performed;Wash/dry hands;Teeth care;Min guard Where Assessed - Grooming: Unsupported standing Upper Body Bathing: Performed;Chest;Right arm;Left arm;Abdomen;Set up Where Assessed - Upper Body Bathing: Unsupported sitting Lower Body Bathing: Performed;Min guard Where Assessed - Lower Body Bathing: Supported sit to stand Upper Body Dressing: Simulated;Set up Where Assessed - Upper Body Dressing: Unsupported sitting Lower Body Dressing: Performed;Min guard;Other (comment) (don underwear and pad) Where Assessed - Lower Body Dressing: Supported sit to Pharmacist, hospital: Performed;Min Pension scheme manager Method: Other (comment) (with IV pole) Acupuncturist: Comfort height toilet Toileting - Clothing Manipulation and Hygiene: Performed;Min guard Where Assessed - Toileting Clothing Manipulation and Hygiene: Sit to stand from 3-in-1 or toilet ADL Comments: set up items for her bath this visit. But moving around pushing her own IV pole with min guard assist. Pt doing well.     OT Diagnosis:    OT Problem List:   OT Treatment Interventions:     OT Goals ADL Goals ADL Goal: Grooming - Progress: Progressing toward goals ADL Goal: Toilet Transfer - Progress: Progressing toward goals ADL Goal:  Toileting - Clothing Manipulation - Progress: Progressing toward goals ADL Goal: Toileting - Hygiene - Progress: Progressing toward goals  Visit Information  Last OT Received On: 04/10/12 Assistance Needed: +1    Subjective Data  Subjective: it is such a blessing to brush my teeth Patient Stated Goal: to walk and do for myself.   Prior Functioning       Cognition  Overall Cognitive Status: Appears within functional limits for tasks assessed/performed Arousal/Alertness: Awake/alert Orientation Level: Appears intact for tasks assessed Behavior During Session: [facility] for tasks performed    Mobility  Shoulder Instructions Bed Mobility Bed Mobility: Supine to Sit Supine to Sit: 5: Supervision;HOB elevated;With rails Sitting - Scoot to Edge of Bed: 5: Supervision Transfers Transfers: Sit to Stand;Stand to Sit Sit to Stand: 4: Min guard;With upper extremity assist;From toilet;From chair/3-in-1 Stand to Sit: 4: Min guard;With upper extremity assist;To toilet;To chair/3-in-1       Exercises      Balance Balance Balance Assessed: Yes Dynamic Standing Balance Dynamic Standing - Level of Assistance: 5: Stand by assistance   End of Session OT - End of Session Activity Tolerance: Patient tolerated treatment well Patient left: in chair;with call bell/phone within reach  GO     Lennox Laity [phone] 04/10/2012, 9:51 AM

## 2012-04-10 NOTE — Progress Notes (Signed)
Physical Therapy Treatment Patient Details Name: Victoria [name] MRN: [id number] DOB: 1926-12-25 Today's Date: 04/10/2012 Time: 1100-1120 PT Time Calculation (min): 20 min  PT Assessment / Plan / Recommendation Comments on Treatment Session  VERY supportive son who ambs pt in the hallway a couple times of day.  Pt very motivated and progressing well.    Follow Up Recommendations  Home health PT    Barriers to Discharge        Equipment Recommendations  None recommended by PT;None recommended by OT    Recommendations for Other Services    Frequency Min 3X/week   Plan Discharge plan remains appropriate    Precautions / Restrictions Precautions Precaution Comments: NPO x ice chips Restrictions Weight Bearing Restrictions: No   Pertinent Vitals/Pain No c/o pain   Mobility  Bed Mobility Bed Mobility: Sit to Supine Supine to Sit: 5: Supervision;HOB elevated;With rails Sitting - Scoot to Edge of Bed: 5: Supervision Sit to Supine: 4: Min guard Details for Bed Mobility Assistance: min guard assist to support B LE up onto bed and increased time to position to comfort  Transfers Transfers: Sit to Stand;Stand to Sit Sit to Stand: 5: Supervision;4: Min guard;From chair/3-in-1;From toilet Stand to Sit: 5: Supervision;4: Min guard;To toilet;To bed Details for Transfer Assistance: increased time due to multiple lines/tubes.  Ambulation/Gait Ambulation/Gait Assistance: 4: Min guard;4: Min Environmental consultant (Feet): 235 Feet Assistive device: 1 person hand held assist Ambulation/Gait Assistance Details: Amb holding to son.  Pt states she walks with him a couple times of day.  Very motivated to get better. Gait Pattern: Step-through pattern     PT Goals                           progressing    Visit Information  Last PT Received On: 04/10/12 Assistance Needed: +1             End of Session PT - End of Session Equipment Utilized During Treatment: Gait belt Activity  Tolerance: Patient tolerated treatment well Patient left: in bed;with call bell/phone within reach;with family/visitor present  Felecia Shelling  PTA [facility]  Acute  Rehab Pager     [phone]

## 2012-04-10 NOTE — Progress Notes (Signed)
I have seen and examined the patient and agree with the assessment and plans.  Reise Gladney A. Kristoffer Bala  MD, FACS  

## 2012-04-10 NOTE — Progress Notes (Signed)
Patient ID: Victoria [name], female   DOB: January 10, 1927, 78 y.o.   MRN: [id number] 6 Days Post-Op  Subjective: Pt feels well.  No nausea.  Some stool in ostomy.  D/w me concerns about some swelling in her LE.  Objective: Vital signs in last 24 hours: Temp:  [97.4 F (36.3 C)-98.4 F (36.9 C)] 98.1 F (36.7 C) (09/16 0548) Pulse Rate:  [54-144] 94  (09/16 0548) Resp:  [17-20] 17  (09/16 0548) BP: (99-123)/(56-75) 113/56 mmHg (09/16 0548) SpO2:  [95 %-96 %] 96 % (09/16 0548) Last BM Date:  (colostomy)  Intake/Output from previous day: 09/15 0701 - 09/16 0700 In: 2094.6 [I.V.:955.6; TPN:1139] Out: 3750 [Urine:2850; Drains:800; Stool:100] Intake/Output this shift: Total I/O In: -  Out: 300 [Urine:300]  PE: Abd: soft, wound clean, g tube with just clear output from ice, ostomy with air and stool.   Lab Results:   Basename 04/10/12 0520 04/08/12 0500  WBC 10.3 10.0  HGB 9.5* 9.2*  HCT 28.2* 26.8*  PLT 309 267   BMET  Basename 04/10/12 0520 04/08/12 0500  NA 128* 125*  K 3.7 3.8  CL 94* 92*  CO2 27 27  GLUCOSE 112* 103*  BUN 13 17  CREATININE 0.55 0.53  CALCIUM 8.1* 8.3*   PT/INR No results found for this basename: LABPROT:2,INR:2 in the last 72 hours CMP     Component Value Date/Time   NA 128* 04/10/2012 0520   K 3.7 04/10/2012 0520   CL 94* 04/10/2012 0520   CO2 27 04/10/2012 0520   GLUCOSE 112* 04/10/2012 0520   BUN 13 04/10/2012 0520   CREATININE 0.55 04/10/2012 0520   CALCIUM 8.1* 04/10/2012 0520   PROT 5.1* 04/10/2012 0520   ALBUMIN 1.8* 04/10/2012 0520   AST 28 04/10/2012 0520   ALT 17 04/10/2012 0520   ALKPHOS 222* 04/10/2012 0520   BILITOT 0.3 04/10/2012 0520   GFRNONAA 84* 04/10/2012 0520   GFRAA >90 04/10/2012 0520   Lipase  No results found for this basename: lipase       Studies/Results: No results found.  Anti-infectives: Anti-infectives     Start     Dose/Rate Route Frequency Ordered Stop   04/04/12 0800   cefOXitin (MEFOXIN) 2 g in dextrose 5 %  50 mL IVPB        2 g 100 mL/hr over 30 Minutes Intravenous  Once 04/04/12 0734 04/04/12 0946           Assessment/Plan  1. S/p ex lap with SB bypass, colostomy, and g tube placement 2. Post op ileus resolving 3. Edema 4. PCM/TNA  Plan: 1. Patient wants to hold off on Lasix, this was d/w her. 2. Will clamp g-tube today. If tolerates this will allow clears tomorrow. 3. Cont mobilization.  4. Cont TNA until tolerating a diet  LOS: 17 days    Eugena Rhue E 04/10/2012, 9:18 AM Pager: [phone]

## 2012-04-10 NOTE — Progress Notes (Signed)
Patient had 9 beats of v. tach earlier, was asymptomatic, patient was resting on bed, vital signs were stable. Dr. Ander Gaster was made aware. Will continue to monitor patient.

## 2012-04-10 NOTE — Progress Notes (Signed)
PARENTERAL NUTRITION CONSULT NOTE - Follow Up  Pharmacy Consult for TNA  Indication: SBO 2/2 neoplasm  Allergies  Allergen Reactions  . Pindolol Swelling    Throat swelled up  . Iodine   . Shellfish Allergy   . Vibramycin (Doxycycline Calcium) Nausea Only  . Doxycycline Nausea Only   Patient Measurements: Height: 5\' 1"  (154.9 cm) Weight: 146 lb 1.6 oz (66.271 kg) IBW/kg (Calculated) : 47.8   Vital Signs: Temp: 98.1 F (36.7 C) (09/16 0548) Temp src: Oral (09/16 0548) BP: 113/56 mmHg (09/16 0548) Pulse Rate: 95  (09/16 0935) Intake/Output from previous day: 09/15 0701 - 09/16 0700 In: 2094.6 [I.V.:955.6; TPN:1139] Out: 3750 [Urine:2850; Drains:800; Stool:100] Intake/Output from this shift: Total I/O In: -  Out: 300 [Urine:300]  Labs:  [facility] 04/10/12 0520 04/08/12 0500 04/07/12 1010  WBC 10.3 10.0 10.8*  HGB 9.5* 9.2* 9.7*  HCT 28.2* 26.8* 28.1*  PLT 309 267 250  APTT -- -- --  INR -- -- --     Basename 04/10/12 0520 04/08/12 0500  NA 128* 125*  K 3.7 3.8  CL 94* 92*  CO2 27 27  GLUCOSE 112* 103*  BUN 13 17  CREATININE 0.55 0.53  LABCREA -- --  CREAT24HRUR -- --  CALCIUM 8.1* 8.3*  MG 1.8 --  PHOS 2.8 --  PROT 5.1* --  ALBUMIN 1.8* --  AST 28 --  ALT 17 --  ALKPHOS 222* --  BILITOT 0.3 --  BILIDIR -- --  IBILI -- --  PREALBUMIN -- --  TRIG 80 --  CHOLHDL -- --  CHOL 115 --   Estimated Creatinine Clearance: 46.4 ml/min (by C-G formula based on Cr of 0.55).    Basename 04/10/12 0602 04/10/12 0020 04/09/12 1641  GLUCAP 121* 129* 129*   CBGs & Insulin requirements past 24 hours:   CBGs < 150, CBG checks and SSI d/c'd 9/14  Nutritional Goals:   RD re-estimated needs 9/13: 1431-1670 kcal and 71-85 grams   Clinimix E 5/20 @ 60 ml/hr + IVF 20% on MWF will provide 72g protein/day, 1746 Kcal on MWF and 1267 Kcal on other days for an average of 1473 Kcal per day of the week.  Current nutrition:   Clinimix E 5/20 at 35ml/hr + fat emulsion  at 5ml/hr on MWF  Diet: NPO except ice chips  IVF:  NS @40  ml/min   Assessment:   78 yo F with rectal mass, s/p sigmoidoscopy 8/19 with pathology showing moderate to poorly differentiated adenocarcinoma, c/w a GYN primary.  Patient developed small bowel obstruction 2/2 neoplasm and without PO intake x 1 week.  TNA started 9/3.   POD#6 s/p ex lap with jejunal bypass, descending colostomy. G-tube placed in OR, 450 mL output recorded yesterday. Surgery notes post-op ileus and to continue TNA for malnutrition, waiting for ileus to resolve.  No history of DM - CBGs at goal <150, CBG checks and SSI have been d/c'd.  Labs   Electrolytes: Lytes wnl. Na low but improving.  Other lytes WNL.  Renal/hepatic function: wnl   Pre-Albumin: 20.2 (9/9)  TG/Cholesterol: wnl (9/9)  Plan:    Continue Clinimix E 5/20 at 60 ml/hr. (goal rate)  TNA to contain IV fat emulsion, standard multivitamins and trace elements only on MWF only due to ongoing shortage.  Deferring management of IVF to MD.  TNA labs Monday/Thursdays.  Bmet in am.  Charolotte Eke, PharmD, pager [phone]. 04/10/2012,10:06 AM.

## 2012-04-11 LAB — BASIC METABOLIC PANEL
BUN: 11 mg/dL (ref 6–23)
Calcium: 7.8 mg/dL — ABNORMAL LOW (ref 8.4–10.5)
Chloride: 96 mEq/L (ref 96–112)
Creatinine, Ser: 0.54 mg/dL (ref 0.50–1.10)
GFR calc Af Amer: >90 mL/min (ref >90)
GFR calc non Af Amer: 85 mL/min — ABNORMAL LOW (ref >90)

## 2012-04-11 MED ORDER — ALTEPLASE 2 MG IJ SOLR
2.0000 mg | Freq: Once | INTRAMUSCULAR | Status: AC
  Administered 2012-04-11: 2 mg
  Filled 2012-04-11: qty 2

## 2012-04-11 MED ORDER — CLINIMIX E/DEXTROSE (5/20) 5 % IV SOLN
INTRAVENOUS | Status: AC
  Administered 2012-04-11: 18:00:00 via INTRAVENOUS
  Filled 2012-04-11: qty 2000

## 2012-04-11 NOTE — Progress Notes (Signed)
Pt declined walking in hallway today.  Stated she did it yesterday 4 times and she was tired.  Will cont Acute PT and check back tomorrow. Felecia Shelling  PTA [facility]  Acute  Rehab Pager     [phone]

## 2012-04-11 NOTE — Progress Notes (Signed)
PARENTERAL NUTRITION CONSULT NOTE - Follow Up  Pharmacy Consult for TNA  Indication: SBO 2/2 neoplasm  Allergies  Allergen Reactions  . Pindolol Swelling    Throat swelled up  . Iodine   . Shellfish Allergy   . Vibramycin (Doxycycline Calcium) Nausea Only  . Doxycycline Nausea Only   Patient Measurements: Height: 5\' 1"  (154.9 cm) Weight: 146 lb 1.6 oz (66.271 kg) IBW/kg (Calculated) : 47.8   Vital Signs: Temp: 97.7 F (36.5 C) (09/17 0532) Temp src: Oral (09/17 0532) BP: 134/76 mmHg (09/17 0532) Pulse Rate: 106  (09/17 0532) Intake/Output from previous day: 09/16 0701 - 09/17 0700 In: 1794.2 [I.V.:609.8; TPN:1184.3] Out: 2860 [Urine:2500; Stool:360] Intake/Output from this shift: Total I/O In: -  Out: 300 [Urine:300]  Labs:  [facility] 04/10/12 0520  WBC 10.3  HGB 9.5*  HCT 28.2*  PLT 309  APTT --  INR --     Basename 04/11/12 0442 04/10/12 0520  NA 127* 128*  K 3.5 3.7  CL 96 94*  CO2 24 27  GLUCOSE 125* 112*  BUN 11 13  CREATININE 0.54 0.55  LABCREA -- --  CREAT24HRUR -- --  CALCIUM 7.8* 8.1*  MG -- 1.8  PHOS -- 2.8  PROT -- 5.1*  ALBUMIN -- 1.8*  AST -- 28  ALT -- 17  ALKPHOS -- 222*  BILITOT -- 0.3  BILIDIR -- --  IBILI -- --  PREALBUMIN -- 8.8*  TRIG -- 80  CHOLHDL -- --  CHOL -- 115   Estimated Creatinine Clearance: 46.4 ml/min (by C-G formula based on Cr of 0.54).    Basename 04/10/12 0602 04/10/12 0020 04/09/12 1641  GLUCAP 121* 129* 129*   CBGs & Insulin requirements past 24 hours:   CBGs < 150, CBG checks and SSI d/c'd 9/14  Nutritional Goals:   RD re-estimated needs 9/13: 1431-1670 kcal and 71-85 grams   Clinimix E 5/20 @ 60 ml/hr + IVF 20% on MWF will provide 72g protein/day, 1746 Kcal on MWF and 1267 Kcal on other days for an average of 1473 Kcal per day of the week.  Current nutrition:   Clinimix E 5/20 at 58ml/hr + fat emulsion at 17ml/hr on MWF  Diet: clear liquid  IVF:  NS @40  ml/min   Assessment:   78 yo  F with rectal mass, s/p sigmoidoscopy 8/19 with pathology showing moderate to poorly differentiated adenocarcinoma, c/w a GYN primary.  Patient developed small bowel obstruction 2/2 neoplasm and without PO intake x 1 week.  TNA started 9/3.   POD#7 s/p ex lap with jejunal bypass, descending colostomy. G-tube placed in OR. Surgery notes post-op ileus and to continue TNA for malnutrition.  Ileus slowly resolving. Trial of CL diet today.  No history of DM - CBGs controlled without SSI.   Labs   Electrolytes: Lytes wnl except Na remains low. On NS IVF.  Renal/hepatic function: wnl   Pre-Albumin: 20.2 -> 8.8   TG/Cholesterol: wnl  Plan:    Continue Clinimix E 5/20 at 60 ml/hr. (goal rate)  F/u tolerance of diet.  TNA to contain IV fat emulsion, standard multivitamins and trace elements only on MWF only due to ongoing shortage.  Deferring management of IVF to MD.  TNA labs Monday/Thursdays.  Bmet in am.  Charolotte Eke, PharmD, pager [phone]. 04/11/2012,8:40 AM.

## 2012-04-11 NOTE — Progress Notes (Signed)
Subjective: Just back to bed after walking   Objective: Vital signs in last 24 hours: Temp:  [97.4 F (36.3 C)-98.4 F (36.9 C)] 97.7 F (36.5 C) (09/17 0532) Pulse Rate:  [90-106] 106  (09/17 0532) Resp:  [17-18] 18  (09/17 0532) BP: (103-134)/(59-76) 134/76 mmHg (09/17 0532) SpO2:  [96 %-100 %] 100 % (09/17 0532) Weight change:  Last BM Date: 04/11/12 Intake/Output from previous day: -1065 09/16 0701 - 09/17 0700 In: 1794.2 [I.V.:609.8; JXB:1478.2] Out: 2860 [Urine:2500; Stool:360] Intake/Output this shift: Total I/O In: 240 [P.O.:240] Out: 750 [Urine:750]  PE: General:alert and oriented, no complaints bright affect Heart:S1S2 irreg irreg Lungs:clear ant NFA:OZHY, non tender. Ext:no significant lower ext edema. 2+ pedal and post  tib pulses   Lab Results:  [facility] 04/10/12 0520  WBC 10.3  HGB 9.5*  HCT 28.2*  PLT 309   BMET  Basename 04/11/12 0442 04/10/12 0520  NA 127* 128*  K 3.5 3.7  CL 96 94*  CO2 24 27  GLUCOSE 125* 112*  BUN 11 13  CREATININE 0.54 0.55  CALCIUM 7.8* 8.1*   No results found for this basename: TROPONINI:2,CK,MB:2 in the last 72 hours  Lab Results  Component Value Date   CHOL 115 04/10/2012   HDL 43 05/15/2008   LDLCALC  Value: 82        Total Cholesterol/HDL:CHD Risk Coronary Heart Disease Risk Table                     Men   Women  1/2 Average Risk   3.4   3.3 05/15/2008   TRIG 80 04/10/2012   CHOLHDL 3.3 05/15/2008   Hepatic Function Panel  Basename 04/10/12 0520  PROT 5.1*  ALBUMIN 1.8*  AST 28  ALT 17  ALKPHOS 222*  BILITOT 0.3  BILIDIR --  IBILI --    Basename 04/10/12 0520  CHOL 115   No results found for this basename: PROTIME in the last 72 hours    EKG: Orders placed during the hospital encounter of 03/24/12  . EKG 12-LEAD  . EKG 12-LEAD  . EKG 12-LEAD  . EKG 12-LEAD  . EKG 12-LEAD  . EKG 12-LEAD  . EKG 12-LEAD  . EKG 12-LEAD  . EKG 12-LEAD  . EKG 12-LEAD  . ED EKG  . ED EKG      Studies/Results: No results found.  Medications: I have reviewed the patient's current medications.    Marland Kitchen alteplase  2 mg Intracatheter Once  . alteplase  2 mg Intracatheter Once  . alteplase  2 mg Intracatheter Once  . enoxaparin  40 mg Subcutaneous Q24H  . metoprolol  2.5 mg Intravenous Q6H   Assessment/Plan: Principal Problem:  *Small bowel obstruction, surgery 04/04/12 Active Problems:  Rectal carcinoma  PAF fib/flutter- Hx RFA 2010  Chronic anticoagulation  S/P [facility] Feb 2006 after failed RFA  Hyponatremia  Pelvic mass in female, necrotic  PLAN:  HR up to 150 with exertion, at times maintaining at 140, A. Flutter, occ pacing.  IV dilt at 15 mg/hr and lopressor 2.5 mg every 6 hours.  BP systolic 103 to 134.   ? Increase BB dose? Advancing diet to sips of clears today by surgery.  Continuing TNA.    LOS: 18 days   Analiza Cowger R 04/11/2012, 11:55 AM

## 2012-04-11 NOTE — Progress Notes (Signed)
7 Days Post-Op  Subjective: Resting quietly, she offers no new c/o this am. No report of N/V overnight.  Objective: Vital signs in last 24 hours: Temp:  [97.4 F (36.3 C)-98.4 F (36.9 C)] 97.7 F (36.5 C) (09/17 0532) Pulse Rate:  [90-106] 106  (09/17 0532) Resp:  [17-18] 18  (09/17 0532) BP: (103-134)/(59-76) 134/76 mmHg (09/17 0532) SpO2:  [96 %-100 %] 100 % (09/17 0532) Last BM Date: 04/11/12  Intake/Output from previous day: 09/16 0701 - 09/17 0700 In: 1794.2 [I.V.:609.8; ZOX:0960.4] Out: 2860 [Urine:2500; Stool:360] Intake/Output this shift: Total I/O In: -  Out: 300 [Urine:300]  General appearance: alert, cooperative, appears stated age and no distress Chest: CTA bilaterally Cardiac: RRR, No M/R/G Abdomen: soft, non tender, wound appears to be healing well, no erythema, or drainage, some stool in colostomy bag + Flatus. Output (370ml/24 hr) Extremities: LLE edema, most likley related to low albumin, hyponatremia.  VSS, aferbrile.  Lab Results:   [facility] 04/10/12 0520  WBC 10.3  HGB 9.5*  HCT 28.2*  PLT 309   BMET  Basename 04/11/12 0442 04/10/12 0520  NA 127* 128*  K 3.5 3.7  CL 96 94*  CO2 24 27  GLUCOSE 125* 112*  BUN 11 13  CREATININE 0.54 0.55  CALCIUM 7.8* 8.1*   PT/INR No results found for this basename: LABPROT:2,INR:2 in the last 72 hours ABG No results found for this basename: PHART:2,PCO2:2,PO2:2,HCO3:2 in the last 72 hours  Studies/Results: No results found.  Anti-infectives: Anti-infectives     Start     Dose/Rate Route Frequency Ordered Stop   04/04/12 0800   cefOXitin (MEFOXIN) 2 g in dextrose 5 % 50 mL IVPB        2 g 100 mL/hr over 30 Minutes Intravenous  Once 04/04/12 0734 04/04/12 0946          Assessment/Plan: Patient Active Problem List  Diagnosis  . Rectal carcinoma  . PAF fib/flutter- Hx RFA 2010  . Chronic anticoagulation  . Diverticulosis  . S/P [facility] Feb 2006 after failed RFA  . Colon cancer  .  Dehydration  . Diarrhea  . Leucocytosis  . Cough  . Hyponatremia  . Anemia  . Small bowel obstruction, surgery 04/04/12  . Pelvic mass in female, necrotic    s/p Procedure(s) (LRB) with comments: COLON RESECTION (N/A) - laparotomy with small bowel resection for obstruction with colostomy with gastrostomy tube placement.   1. S/p ex lap with SB bypass, colostomy, and g tube placement  2. Post op ileus resolving  3. Edema  4. PCM/TNA  Plan:  1. Advance diet to sips of clears today, advancing this as tolerated. 2. Follow labs 3. Cont mobilization.  4. Cont TNA until tolerating a diet    LOS: 18 days    Bri Wakeman 04/11/2012

## 2012-04-11 NOTE — Progress Notes (Signed)
Pt. Seen and examined. Agree with the NP/PA-C note as written.  No changes recommended to medications due to low normal systolic blood pressure. She is only on prophylactic lovenox (see Dr. Royann Shivers and Dr. Kalman Drape notes).  Continues in a-flutter. Starting to advance diet. Would consider anticoagulation and po rate control agents when able to take po reliably. This would reasonably be long acting cardizem.  I would also recommend re-starting amiodarone po once she is therapeutically anticoagulated to help with rate./rhyhtm control.  This loading for several weeks would be essential prior to an attempt at TEE/Cardioversion in the future, especially given her history of failed RFA in the past.  Chrystie Nose, MD, [facility] Attending Cardiologist [facility]

## 2012-04-11 NOTE — Progress Notes (Signed)
I have seen and examined the patient and agree with the assessment and plans.  Reyes Fifield A. Martinique Pizzimenti  MD, FACS  

## 2012-04-12 LAB — BASIC METABOLIC PANEL
Calcium: 8.1 mg/dL — ABNORMAL LOW (ref 8.4–10.5)
Creatinine, Ser: 0.56 mg/dL (ref 0.50–1.10)
GFR calc Af Amer: >90 mL/min (ref >90)
GFR calc non Af Amer: 84 mL/min — ABNORMAL LOW (ref >90)
Sodium: 127 mEq/L — ABNORMAL LOW (ref 135–145)

## 2012-04-12 MED ORDER — ZINC TRACE METAL 1 MG/ML IV SOLN
INTRAVENOUS | Status: DC
  Administered 2012-04-12: 18:00:00 via INTRAVENOUS
  Filled 2012-04-12: qty 1000

## 2012-04-12 MED ORDER — FAT EMULSION 20 % IV EMUL
250.0000 mL | INTRAVENOUS | Status: AC
  Administered 2012-04-12: 250 mL via INTRAVENOUS
  Filled 2012-04-12: qty 250

## 2012-04-12 MED ORDER — FAT EMULSION 20 % IV EMUL
250.0000 mL | INTRAVENOUS | Status: DC
  Filled 2012-04-12: qty 250

## 2012-04-12 MED ORDER — ZINC TRACE METAL 1 MG/ML IV SOLN
INTRAVENOUS | Status: DC
  Filled 2012-04-12: qty 2000

## 2012-04-12 MED ORDER — BOOST PLUS PO LIQD
237.0000 mL | Freq: Three times a day (TID) | ORAL | Status: DC
  Administered 2012-04-12 – 2012-04-14 (×4): 237 mL via ORAL
  Filled 2012-04-12 (×8): qty 237

## 2012-04-12 NOTE — Progress Notes (Addendum)
PARENTERAL NUTRITION CONSULT NOTE - Follow Up  Pharmacy Consult for TNA  Indication: SBO 2/2 neoplasm  Allergies  Allergen Reactions  . Pindolol Swelling    Throat swelled up  . Iodine   . Shellfish Allergy   . Vibramycin (Doxycycline Calcium) Nausea Only  . Doxycycline Nausea Only   Patient Measurements: Height: 5\' 1"  (154.9 cm) Weight: 146 lb 1.6 oz (66.271 kg) IBW/kg (Calculated) : 47.8   Vital Signs: Temp: 98 F (36.7 C) (09/18 0625) Temp src: Oral (09/18 0625) BP: 107/58 mmHg (09/18 0625) Pulse Rate: 77  (09/18 0625) Intake/Output from previous day: 09/17 0701 - 09/18 0700 In: 2950 [P.O.:700; I.V.:710; TPN:1540] Out: 2650 [Urine:2650] Intake/Output from this shift:    Labs:  [facility] 04/10/12 0520  WBC 10.3  HGB 9.5*  HCT 28.2*  PLT 309  APTT --  INR --     Basename 04/12/12 0525 04/11/12 0442 04/10/12 0520  NA 127* 127* 128*  K 3.8 3.5 3.7  CL 96 96 94*  CO2 24 24 27   GLUCOSE 118* 125* 112*  BUN 12 11 13   CREATININE 0.56 0.54 0.55  LABCREA -- -- --  CREAT24HRUR -- -- --  CALCIUM 8.1* 7.8* 8.1*  MG -- -- 1.8  PHOS -- -- 2.8  PROT -- -- 5.1*  ALBUMIN -- -- 1.8*  AST -- -- 28  ALT -- -- 17  ALKPHOS -- -- 222*  BILITOT -- -- 0.3  BILIDIR -- -- --  IBILI -- -- --  PREALBUMIN -- -- 8.8*  TRIG -- -- 80  CHOLHDL -- -- --  CHOL -- -- 115   Estimated Creatinine Clearance: 46.4 ml/min (by C-G formula based on Cr of 0.56).    Basename 04/10/12 0602 04/10/12 0020 04/09/12 1641  GLUCAP 121* 129* 129*   CBGs & Insulin requirements past 24 hours:   CBGs < 150, CBG checks and SSI d/c'd 9/14  Nutritional Goals:   RD re-estimated needs 9/13: 1431-1670 kcal and 71-85 grams   Clinimix E 5/20 @ 60 ml/hr + IVF 20% on MWF will provide 72g protein/day, 1746 Kcal on MWF and 1267 Kcal on other days for an average of 1473 Kcal per day of the week.  Current nutrition:   Clinimix E 5/20 at 38ml/hr + fat emulsion at 86ml/hr on MWF  Diet: clear  liquid  IVF:  NS @40  ml/min   Assessment:   78 yo F with rectal mass, s/p sigmoidoscopy 8/19 with pathology showing moderate to poorly differentiated adenocarcinoma, c/w a GYN primary.  Patient developed small bowel obstruction 2/2 neoplasm and without PO intake x 1 week.  TNA started 9/3.   POD#8 s/p ex lap with jejunal bypass, descending colostomy. G-tube placed in OR. Surgery notes post-op ileus and to continue TNA for malnutrition.  Ileus slowly resolving. Tolerating small amounts of clears, BM x2 9/17.  No history of DM - CBGs controlled without SSI.   Labs   Electrolytes: Lytes wnl except Na remains low. On NS IVF.  Renal/hepatic function: wnl   Pre-Albumin: 20.2 -> 8.8   TG/Cholesterol: wnl  Plan:    Continue Clinimix E 5/20 at 60 ml/hr. (goal rate)  F/u tolerance of diet.  TNA to contain IV fat emulsion, standard multivitamins and trace elements only on MWF only due to ongoing shortage.  Deferring management of IVF to MD.  TNA labs Monday/Thursdays.  Bmet in am.   [name], PharmD, BCPS Pager: [phone]  04/12/2012,7:07 AM.   Addendum: Per surgery, begin  weaning TNA - decrease rate to 53ml/hr today. Plan is to d/c TNA if tolerates full liquids today.  [name], PharmD, BCPS 04/12/2012 9:28 AM

## 2012-04-12 NOTE — Progress Notes (Signed)
Patient having yellow liquid, appears to be urine, from colostomy; minimal stool at this time; notified dr. Maisie Fus, surgeon on call, who ordered fluid to be sent for creatinine; will continue to monitor at this time

## 2012-04-12 NOTE — Progress Notes (Signed)
I have seen and examined the patient and agree with the assessment and plans.  Tamara Monteith A. Brooklyn Jeff  MD, FACS  

## 2012-04-12 NOTE — Progress Notes (Signed)
Patient ID: Victoria [name], female   DOB: Dec 24, 1926, 78 y.o.   MRN: [id number] 8 Days Post-Op  Subjective: Pt feels well today.  No complaints.  Tolerating clears well.  Objective: Vital signs in last 24 hours: Temp:  [97.8 F (36.6 C)-98.3 F (36.8 C)] 98 F (36.7 C) (09/18 0625) Pulse Rate:  [77-100] 77  (09/18 0625) Resp:  [16-18] 16  (09/18 0625) BP: (107-135)/(58-70) 107/58 mmHg (09/18 0625) SpO2:  [97 %-100 %] 97 % (09/18 0625) Last BM Date: 04/11/12  Intake/Output from previous day: 09/17 0701 - 09/18 0700 In: 2950 [P.O.:700; I.V.:710; TPN:1540] Out: 2650 [Urine:2650] Intake/Output this shift:    PE: Abd: soft, minimally tender, ostomy with liquid and semi-solid stool present, wound is clean.  g tube in place and clamped.    Lab Results:   [facility] 04/10/12 0520  WBC 10.3  HGB 9.5*  HCT 28.2*  PLT 309   BMET  Basename 04/12/12 0525 04/11/12 0442  NA 127* 127*  K 3.8 3.5  CL 96 96  CO2 24 24  GLUCOSE 118* 125*  BUN 12 11  CREATININE 0.56 0.54  CALCIUM 8.1* 7.8*   PT/INR No results found for this basename: LABPROT:2,INR:2 in the last 72 hours CMP     Component Value Date/Time   NA 127* 04/12/2012 0525   K 3.8 04/12/2012 0525   CL 96 04/12/2012 0525   CO2 24 04/12/2012 0525   GLUCOSE 118* 04/12/2012 0525   BUN 12 04/12/2012 0525   CREATININE 0.56 04/12/2012 0525   CALCIUM 8.1* 04/12/2012 0525   PROT 5.1* 04/10/2012 0520   ALBUMIN 1.8* 04/10/2012 0520   AST 28 04/10/2012 0520   ALT 17 04/10/2012 0520   ALKPHOS 222* 04/10/2012 0520   BILITOT 0.3 04/10/2012 0520   GFRNONAA 84* 04/12/2012 0525   GFRAA >90 04/12/2012 0525   Lipase  No results found for this basename: lipase       Studies/Results: No results found.  Anti-infectives: Anti-infectives     Start     Dose/Rate Route Frequency Ordered Stop   04/04/12 0800   cefOXitin (MEFOXIN) 2 g in dextrose 5 % 50 mL IVPB        2 g 100 mL/hr over 30 Minutes Intravenous  Once 04/04/12 0734 04/04/12 0946             Assessment/Plan  1. S/p ex lap with SB bypass colostomy, and g tube placement secondary to pelvic masses 2. A flutter/fib 3. PCM/TNA  Plan: 1. Advance diet to full liquids today 2. Ask pharmacy to start weaning TNA.  If tolerates fulls today, will look at dc TNA tomorrow 3. Appreciate cardiology assistance.  Will follow their notes on whether patient needs anticoagulation, etc. 4. Depending on anticoagulation status, from a surgical standpoint, we can hopefully anticipate dc around Friday.  I have informed the patient and family that if anticoagulation is started she may have to be here longer, but we would just have to see.   LOS: 19 days    Jaelon Gatley E 04/12/2012, 9:10 AM Pager: [phone]

## 2012-04-12 NOTE — Progress Notes (Signed)
Physical Therapy Treatment Patient Details Name: Victoria [name] MRN: [id number] DOB: March 17, 1927 Today's Date: 04/12/2012 Time: 4098-1191 PT Time Calculation (min): 14 min  PT Assessment / Plan / Recommendation Comments on Treatment Session  Pt progressing very well with mobility. Pt now using straight cane to walk 245' with supervision, no LOB.  Pt very motivated to walk. No HHPT needed 2* pt at supervision/modified independent level with ambulation.     Follow Up Recommendations  No PT follow up    Barriers to Discharge        Equipment Recommendations  None recommended by PT;None recommended by OT    Recommendations for Other Services    Frequency Min 3X/week   Plan Discharge plan remains appropriate;Frequency remains appropriate    Precautions / Restrictions Restrictions Weight Bearing Restrictions: No   Pertinent Vitals/Pain *0/10**    Mobility  Bed Mobility Bed Mobility: Supine to Sit Supine to Sit: 6: Modified independent (Device/Increase time);With rails;HOB elevated Sitting - Scoot to Edge of Bed: 6: Modified independent (Device/Increase time);With rail Details for Bed Mobility Assistance: HOB 45* Transfers Transfers: Sit to Stand;Stand to Sit Sit to Stand: 5: Supervision;From bed;With upper extremity assist Stand to Sit: 5: Supervision;To chair/3-in-1;With upper extremity assist;With armrests Details for Transfer Assistance: VCs hand placement Ambulation/Gait Ambulation/Gait Assistance: 5: Supervision Ambulation Distance (Feet): 245 Feet Assistive device: Straight cane Gait Pattern: Within Functional Limits Gait velocity: WNL General Gait Details: no LOB    Exercises     PT Diagnosis:    PT Problem List:   PT Treatment Interventions:     PT Goals Acute Rehab PT Goals PT Goal Formulation: With patient Time For Goal Achievement: 04/21/12 Potential to Achieve Goals: Good Pt will go Supine/Side to Sit: with modified independence;with HOB 0 degrees PT  Goal: Supine/Side to Sit - Progress: Progressing toward goal Pt will go Sit to Supine/Side: with modified independence;with HOB 0 degrees Pt will go Sit to Stand: with modified independence PT Goal: Sit to Stand - Progress: Progressing toward goal Pt will go Stand to Sit: with modified independence PT Goal: Stand to Sit - Progress: Progressing toward goal Pt will Ambulate: >150 feet;with modified independence;with least restrictive assistive device PT Goal: Ambulate - Progress: Progressing toward goal Pt will Go Up / Down Stairs: 3-5 stairs;with modified independence;with least restrictive assistive device  Visit Information  Last PT Received On: 04/12/12 Assistance Needed: +1    Subjective Data  Subjective: It feels good to walk.  I used to walk 2 miles a day.  Patient Stated Goal: to go home, be able to feed the birds   Cognition  Overall Cognitive Status: Appears within functional limits for tasks assessed/performed Arousal/Alertness: Awake/alert Orientation Level: Appears intact for tasks assessed Behavior During Session: [facility] for tasks performed    Balance     End of Session PT - End of Session Activity Tolerance: Patient tolerated treatment well Patient left: with call bell/phone within reach;with family/visitor present;in chair   GP     Ralene Bathe Kistler 04/12/2012, 10:01 AM [phone]

## 2012-04-13 ENCOUNTER — Encounter: Payer: Self-pay | Admitting: Internal Medicine

## 2012-04-13 LAB — COMPREHENSIVE METABOLIC PANEL
BUN: 11 mg/dL (ref 6–23)
Calcium: 8.2 mg/dL — ABNORMAL LOW (ref 8.4–10.5)
Creatinine, Ser: 0.6 mg/dL (ref 0.50–1.10)
GFR calc Af Amer: >90 mL/min (ref >90)
Glucose, Bld: 106 mg/dL — ABNORMAL HIGH (ref 70–99)
Total Protein: 5.3 g/dL — ABNORMAL LOW (ref 6.0–8.3)

## 2012-04-13 LAB — MAGNESIUM: Magnesium: 1.8 mg/dL (ref 1.5–2.5)

## 2012-04-13 LAB — CREATININE, FLUID (PLEURAL, PERITONEAL, JP DRAINAGE): Creat, Fluid: 0.3 mg/dL

## 2012-04-13 LAB — PHOSPHORUS: Phosphorus: 3.1 mg/dL (ref 2.3–4.6)

## 2012-04-13 MED ORDER — WARFARIN - PHARMACIST DOSING INPATIENT: Freq: Every day | Status: DC

## 2012-04-13 MED ORDER — DILTIAZEM HCL 100 MG IV SOLR
5.0000 mg/h | INTRAVENOUS | Status: DC
  Filled 2012-04-13: qty 100

## 2012-04-13 MED ORDER — WARFARIN SODIUM 5 MG PO TABS
5.0000 mg | ORAL_TABLET | Freq: Once | ORAL | Status: AC
  Administered 2012-04-13: 5 mg via ORAL
  Filled 2012-04-13: qty 1

## 2012-04-13 MED ORDER — ZINC TRACE METAL 1 MG/ML IV SOLN
INTRAVENOUS | Status: AC
  Filled 2012-04-13: qty 1000

## 2012-04-13 MED ORDER — METOPROLOL TARTRATE 25 MG PO TABS
25.0000 mg | ORAL_TABLET | Freq: Two times a day (BID) | ORAL | Status: DC
  Administered 2012-04-13 – 2012-04-14 (×3): 25 mg via ORAL
  Filled 2012-04-13 (×4): qty 1

## 2012-04-13 MED ORDER — DILTIAZEM HCL ER COATED BEADS 240 MG PO CP24
240.0000 mg | ORAL_CAPSULE | Freq: Every day | ORAL | Status: DC
  Administered 2012-04-13 – 2012-04-14 (×2): 240 mg via ORAL
  Filled 2012-04-13 (×2): qty 1

## 2012-04-13 NOTE — Progress Notes (Signed)
Occupational Therapy Treatment Patient Details Name: Victoria [name] MRN: [id number] DOB: December 31, 1926 Today's Date: 04/13/2012 Time: 5621-3086 OT Time Calculation (min): 13 min  OT Assessment / Plan / Recommendation Comments on Treatment Session Pt progressing well.  She currently requires supervision due to multiple IV lines, but would otherwise be modified independent with BADLs.  Pt. is hopeful for discharge home tomorrow, and has good family support.  No further OT recommended    Follow Up Recommendations  No OT follow up    Barriers to Discharge       Equipment Recommendations  None recommended by PT;None recommended by OT    Recommendations for Other Services    Frequency     Plan All goals met and education completed, patient discharged from OT services (Pt. is supervision due to lines)    Precautions / Restrictions Restrictions Weight Bearing Restrictions: No   Pertinent Vitals/Pain     ADL  Grooming: Simulated;Wash/dry hands;Wash/dry face;Teeth care;Supervision/safety Where Assessed - Grooming: Unsupported standing Upper Body Bathing: Simulated;Set up Where Assessed - Upper Body Bathing: Unsupported sitting Lower Body Bathing: Simulated;Supervision/safety Where Assessed - Lower Body Bathing: Supported sit to stand Upper Body Dressing: Performed;Supervision/safety Where Assessed - Upper Body Dressing: Unsupported standing Lower Body Dressing: Simulated;Performed;Supervision/safety Where Assessed - Lower Body Dressing: Unsupported sit to stand Toilet Transfer: Simulated;Supervision/safety Toilet Transfer Method: Sit to Barista: Comfort height toilet Toileting - Clothing Manipulation and Hygiene: Simulated;Supervision/safety Where Assessed - Engineer, mining and Hygiene: Sit to stand from 3-in-1 or toilet Equipment Used: Cane Transfers/Ambulation Related to ADLs: Pt. is ambulating with supervision with SPC ADL Comments: Pt.  requires supervision due to multiple IV lines, but is quickly progressing toward modified independence.  pt. reports plan is for discharge home tomorrow.  SHe reports she plans to sponge bathe.    OT Diagnosis:    OT Problem List:   OT Treatment Interventions:     OT Goals ADL Goals ADL Goal: Grooming - Progress: Discontinued (comment) ADL Goal: Toilet Transfer - Progress: Discontinued (comment) ADL Goal: Toileting - Clothing Manipulation - Progress: Discontinued (comment) ADL Goal: Toileting - Hygiene - Progress: Discontinued (comment) ADL Goal: Tub/Shower Transfer - Progress: Discontinued (comment) (Pt. will sponge bathe) ADL Goal: Additional Goal #1 - Progress: Discontinued (comment)  Visit Information  Last OT Received On: 04/13/12 Assistance Needed: +1    Subjective Data      Prior Functioning       Cognition  Overall Cognitive Status: Appears within functional limits for tasks assessed/performed Arousal/Alertness: Awake/alert Orientation Level: Appears intact for tasks assessed Behavior During Session: [facility] for tasks performed    Mobility  Shoulder Instructions Transfers Transfers: Sit to Stand;Stand to Sit Sit to Stand: From chair/3-in-1;With upper extremity assist;6: Modified independent (Device/Increase time) Stand to Sit: 6: Modified independent (Device/Increase time);To chair/3-in-1       Exercises      Balance     End of Session OT - End of Session Activity Tolerance: Patient tolerated treatment well Patient left: in chair;with call bell/phone within reach;with family/visitor present  GO     Canna Nickelson, Ursula Alert M 04/13/2012, 9:59 PM

## 2012-04-13 NOTE — Progress Notes (Signed)
Physical Therapy Treatment Patient Details Name: Victoria [name] MRN: [id number] DOB: 07-05-1927 Today's Date: 04/13/2012 Time: 4098-1191 PT Time Calculation (min): 17 min  PT Assessment / Plan / Recommendation Comments on Treatment Session  Pt progressing well.  Amb with staright cane.    Follow Up Recommendations  No PT follow up    Barriers to Discharge        Equipment Recommendations  None recommended by PT;None recommended by OT    Recommendations for Other Services    Frequency     Plan Discharge plan remains appropriate    Precautions / Restrictions Restrictions Weight Bearing Restrictions: No    Pertinent Vitals/Pain No c/o pain    Mobility  Bed Mobility Bed Mobility: Sit to Supine Sit to Supine: 4: Min guard Details for Bed Mobility Assistance: increased time  Transfers Transfers: Sit to Stand;Stand to Sit Sit to Stand: 5: Supervision;From bed;From toilet Stand to Sit: 5: Supervision;To toilet;To bed Details for Transfer Assistance: increased time, good hands tech  Ambulation/Gait Ambulation/Gait Assistance: 5: Supervision Ambulation Distance (Feet): 250 Feet Assistive device: Straight cane Ambulation/Gait Assistance Details: Amb with her straright cane @ good velocity.  very motivated to get better. Gait Pattern: Step-through pattern;Within Functional Limits    PT Goals     progressing    Visit Information  Last PT Received On: 04/13/12 Assistance Needed: +1                   End of Session PT - End of Session Equipment Utilized During Treatment: Gait belt Activity Tolerance: Patient tolerated treatment well Patient left: in bed;with call bell/phone within reach;with family/visitor present   Felecia Shelling  PTA [facility]  Acute  Rehab Pager     [phone]

## 2012-04-13 NOTE — Progress Notes (Signed)
Subjective: Feeling well, HR up to 120 at times  Objective: Vital signs in last 24 hours: Temp:  [97.6 F (36.4 C)-97.7 F (36.5 C)] 97.6 F (36.4 C) (09/19 0507) Pulse Rate:  [97-105] 104  (09/19 0507) Resp:  [16-18] 18  (09/19 0507) BP: (116-124)/(71-72) 116/72 mmHg (09/19 0507) SpO2:  [94 %-96 %] 96 % (09/19 0507) Weight change:  Last BM Date: 04/13/12 Intake/Output from previous day: +251 09/18 0701 - 09/19 0700 In: 2752.8 [P.O.:840; I.V.:383.5; TPN:1529.3] Out: 2501 [Urine:2500; Stool:1] Intake/Output this shift: Total I/O In: 240 [P.O.:240] Out: 300 [Urine:300]  PE: General:Alert, oriented, comfortable Heart:irregular, no murmurs or gallop Lungs:clear bilaterally WUJ:WJXBJY distended, not tender, normal bowel sounds Ext:no edema, normal pulses Neuro:normal    BMET  Basename 04/13/12 0423 04/12/12 0525  NA 127* 127*  K 3.6 3.8  CL 96 96  CO2 25 24  GLUCOSE 106* 118*  BUN 11 12  CREATININE 0.60 0.56  CALCIUM 8.2* 8.1*   No results found for this basename: TROPONINI:2,CK,MB:2 in the last 72 hours  Lab Results  Component Value Date   CHOL 115 04/10/2012   HDL 43 05/15/2008   LDLCALC  Value: 82        Total Cholesterol/HDL:CHD Risk Coronary Heart Disease Risk Table                     Men   Women  1/2 Average Risk   3.4   3.3 05/15/2008   TRIG 80 04/10/2012   CHOLHDL 3.3 05/15/2008     Hepatic Function Panel  Basename 04/13/12 0423  PROT 5.3*  ALBUMIN 2.0*  AST 22  ALT 16  ALKPHOS 182*  BILITOT 0.2*  BILIDIR --  IBILI --   No results found for this basename: CHOL in the last 72 hours No results found for this basename: PROTIME in the last 72 hours    EKG: Orders placed during the hospital encounter of 03/24/12  . EKG 12-LEAD  . EKG 12-LEAD  . EKG 12-LEAD  . EKG 12-LEAD  . EKG 12-LEAD  . EKG 12-LEAD  . EKG 12-LEAD  . EKG 12-LEAD  . EKG 12-LEAD  . EKG 12-LEAD  . ED EKG  . ED EKG    Studies/Results: No results found.  Medications:  I have reviewed the patient's current medications.    . enoxaparin  40 mg Subcutaneous Q24H  . lactose free nutrition  237 mL Oral TID WC  . metoprolol  2.5 mg Intravenous Q6H   Assessment/Plan: Principal Problem:  *Small bowel obstruction, surgery 04/04/12 Active Problems:  Rectal carcinoma  PAF fib/flutter- Hx RFA 2010  Chronic anticoagulation  S/P [facility] Feb 2006 after failed RFA  Hyponatremia  Pelvic mass in female, necrotic  PLAN:  HR elevated with exertion to 120, other times rate controlled.  To advance to regular diet today.  Will change Lopressor to po, continue IV cardizem for now.  Begin coumadin today? Continues with anemia H/H 9.5/309 04/11/12 which was stable. Hyponatremia continues   LOS: 20 days   INGOLD,LAURA R 04/13/2012, 12:07 PM  I have seen and examined the patient along with Nada Boozer, NP.  I have reviewed the chart, notes and new data.  I agree with NP's note.   PLAN: We have a window of opportunity when no surgery is planned and we can expect a month without planned interruptions in anticoagulation. This will allow for a DC cardioversion without an unnecessarily increased stroke/embolism risk.  Start warfarin  today. Check INR next Wednesday. If INR at or close to therapeutic range, plan for TEE guided cardioversion next Friday. Change rate control meds to PO, DC IV diltiazem.  Thurmon Fair, MD, [facility] [phone] 04/13/2012, 12:49 PM

## 2012-04-13 NOTE — Progress Notes (Signed)
Nutrition Follow-up  Intervention:  TPN decreased to 40 ml/hr and will d/c today.  Pt for discharge tomorrow.  Continue increasing intake of regular diet as tolerated and Boost as needed.  Assessment:   Pt having more solid BMs in her ostomy.  Tolerated Solid Diet.  Eating small amounts and states that she will gradually increase.  TPN:  Clinimix E 5/15 at 60 ml/hr plus 20% lipids at 10 ml/hr MWF providing a daily average of 1228 kcal, 72 gm protein.  Diet Order:  Regular (advanced this am), Boost tid  Meds: Scheduled Meds:   . enoxaparin  40 mg Subcutaneous Q24H  . lactose free nutrition  237 mL Oral TID WC  . metoprolol  2.5 mg Intravenous Q6H   Continuous Infusions:   . diltiazem (CARDIZEM) infusion 15 mg/hr (04/13/12 1118)  . fat emulsion 250 mL (04/12/12 1733)  . TPN (CLINIMIX) +/- additives 60 mL/hr at 04/11/12 1822  . TPN (CLINIMIX) +/- additives    . DISCONTD: sodium chloride 20 mL/hr at 04/12/12 0552  . DISCONTD: TPN (CLINIMIX) +/- additives 40 mL/hr at 04/12/12 1733   PRN Meds:.acetaminophen, acetaminophen, metoprolol, morphine injection, ondansetron (ZOFRAN) IV, phenol, promethazine, sodium chloride  Labs:  CMP     Component Value Date/Time   NA 127* 04/13/2012 0423   K 3.6 04/13/2012 0423   CL 96 04/13/2012 0423   CO2 25 04/13/2012 0423   GLUCOSE 106* 04/13/2012 0423   BUN 11 04/13/2012 0423   CREATININE 0.60 04/13/2012 0423   CALCIUM 8.2* 04/13/2012 0423   PROT 5.3* 04/13/2012 0423   ALBUMIN 2.0* 04/13/2012 0423   AST 22 04/13/2012 0423   ALT 16 04/13/2012 0423   ALKPHOS 182* 04/13/2012 0423   BILITOT 0.2* 04/13/2012 0423   GFRNONAA 82* 04/13/2012 0423   GFRAA >90 04/13/2012 0423     Intake/Output Summary (Last 24 hours) at 04/13/12 1210 Last data filed at 04/13/12 0849  Gross per 24 hour  Intake 2752.84 ml  Output   2601 ml  Net 151.84 ml    Weight Status:  66.3 kg 9/15- stable  Re-estimated needs:  1450-1650 kcal, 70-85 gm protein  Nutrition Dx:   Inadequate oral intake-progressing  Goal:  1.  Diet advancement as medically able-progressing 2.  Meet >90% estimated needs with nutrition support- meeting  Monitor:  TPN with pharmacy, diet advancements, weight, labs   Oran Rein, RD, LDN Clinical Inpatient Dietitian Pager:  [phone] Weekend and after hours pager:  [phone]  ]

## 2012-04-13 NOTE — Progress Notes (Signed)
ANTICOAGULATION CONSULT NOTE - Initial Consult  Pharmacy Consult for Coumadin Indication: atrial fibrillation  Allergies  Allergen Reactions  . Pindolol Swelling    Throat swelled up  . Iodine   . Shellfish Allergy   . Vibramycin (Doxycycline Calcium) Nausea Only  . Doxycycline Nausea Only    Patient Measurements: Height: 5\' 1"  (154.9 cm) Weight: 146 lb 1.6 oz (66.271 kg) IBW/kg (Calculated) : 47.8   Vital Signs: Temp: 97.6 F (36.4 C) (09/19 0507) Temp src: Oral (09/19 0507) BP: 116/72 mmHg (09/19 0507) Pulse Rate: 104  (09/19 0507)  Labs:  Victoria [name] 04/13/12 0423 04/12/12 0525 04/11/12 0442  HGB -- -- --  HCT -- -- --  PLT -- -- --  APTT -- -- --  LABPROT -- -- --  INR -- -- --  HEPARINUNFRC -- -- --  CREATININE 0.60 0.56 0.54  CKTOTAL -- -- --  CKMB -- -- --  TROPONINI -- -- --    Estimated Creatinine Clearance: 46.4 ml/min (by C-G formula based on Cr of 0.6).   Medical History: Past Medical History  Diagnosis Date  . Hypertension   . Atrial fibrillation   . Hyperlipidemia   . Rectal carcinoma   . Colon cancer 03/13/12 egd    rectal mass5cm  , 2-3cm proximal to anal verge  . Diverticulosis   . Allergy   . Anxiety     rhematoid  . Vomiting     started on 03/23/12  . Obstruction colon 03/24/12    family told obstruction may be outside colon  . Pelvic mass in female, necrotic 03/28/2012    Medications:  Scheduled:    . diltiazem  240 mg Oral Daily  . enoxaparin  40 mg Subcutaneous Q24H  . lactose free nutrition  237 mL Oral TID WC  . metoprolol tartrate  25 mg Oral BID  . DISCONTD: metoprolol  2.5 mg Intravenous Q6H   Infusions:    . diltiazem (CARDIZEM) infusion    . fat emulsion 250 mL (04/12/12 1733)  . TPN (CLINIMIX) +/- additives 60 mL/hr at 04/11/12 1822  . TPN (CLINIMIX) +/- additives    . DISCONTD: sodium chloride 20 mL/hr at 04/12/12 0552  . DISCONTD: diltiazem (CARDIZEM) infusion 15 mg/hr (04/13/12 1118)  . DISCONTD: TPN  (CLINIMIX) +/- additives 40 mL/hr at 04/12/12 1733    Assessment:  85 YOF on chronic coumadin for Afib, held on admission for SBO, s/p colostomy  Home dose 5mg  daily, INR supratherapeutic on admit  Coumadin ordered to resume today as SBO resolving and advancing diet  PT/INR pending  Goal of Therapy:  INR 2-3 Monitor platelets by anticoagulation protocol: Yes   Plan:   Coumadin 5mg  po today  Daily PT/INR  Continue lovenox 40mg  sq q24h per MD  H/H low, Pltc wnl, no bleeding/complications reported.  [name], PharmD, BCPS Pager: [phone] 04/13/2012,1:00 PM

## 2012-04-13 NOTE — Progress Notes (Signed)
Patient ID: Victoria [name], female   DOB: August 27, 1926, 78 y.o.   MRN: [id number] 9 Days Post-Op  Subjective: Pt feels well.  Having more solid BMs in her ostomy.  Tolerating her full liquids without difficulty.  Objective: Vital signs in last 24 hours: Temp:  [97.6 F (36.4 C)-97.7 F (36.5 C)] 97.6 F (36.4 C) (09/19 0507) Pulse Rate:  [97-105] 104  (09/19 0507) Resp:  [16-18] 18  (09/19 0507) BP: (116-124)/(71-72) 116/72 mmHg (09/19 0507) SpO2:  [94 %-96 %] 96 % (09/19 0507) Last BM Date: 04/11/12  Intake/Output from previous day: 09/18 0701 - 09/19 0700 In: 2752.8 [P.O.:840; I.V.:383.5; TPN:1529.3] Out: 2501 [Urine:2500; Stool:1] Intake/Output this shift: Total I/O In: 240 [P.O.:240] Out: 300 [Urine:300]  PE: Abd: soft, essentially NT, no evidence of urine in her ostomy, semisolid stool present, wound is clean and packed.  Lab Results:  No results found for this basename: WBC:2,HGB:2,HCT:2,PLT:2 in the last 72 hours BMET  Basename 04/13/12 0423 04/12/12 0525  NA 127* 127*  K 3.6 3.8  CL 96 96  CO2 25 24  GLUCOSE 106* 118*  BUN 11 12  CREATININE 0.60 0.56  CALCIUM 8.2* 8.1*   PT/INR No results found for this basename: LABPROT:2,INR:2 in the last 72 hours CMP     Component Value Date/Time   NA 127* 04/13/2012 0423   K 3.6 04/13/2012 0423   CL 96 04/13/2012 0423   CO2 25 04/13/2012 0423   GLUCOSE 106* 04/13/2012 0423   BUN 11 04/13/2012 0423   CREATININE 0.60 04/13/2012 0423   CALCIUM 8.2* 04/13/2012 0423   PROT 5.3* 04/13/2012 0423   ALBUMIN 2.0* 04/13/2012 0423   AST 22 04/13/2012 0423   ALT 16 04/13/2012 0423   ALKPHOS 182* 04/13/2012 0423   BILITOT 0.2* 04/13/2012 0423   GFRNONAA 82* 04/13/2012 0423   GFRAA >90 04/13/2012 0423   Lipase  No results found for this basename: lipase       Studies/Results: No results found.  Anti-infectives: Anti-infectives     Start     Dose/Rate Route Frequency Ordered Stop   04/04/12 0800   cefOXitin (MEFOXIN) 2 g in  dextrose 5 % 50 mL IVPB        2 g 100 mL/hr over 30 Minutes Intravenous  Once 04/04/12 0734 04/04/12 0946           Assessment/Plan  1. Pelvic masses with rectal invasion 2. S/p ex lap with SB bypass, colostomy, placement of g-tube 3. PCM/TNA  Plan: 1. Advance to regular diet today.  2. Wean TNA to off today, cont Boost for nutrition. 3. Await cardiology input on whether patient will need anticoagulation or just switching her IV meds to po prior to dc home.  LOS: 20 days    Ameyah Bangura E 04/13/2012, 9:19 AM Pager: [phone]

## 2012-04-13 NOTE — Progress Notes (Signed)
I have seen and examined the patient and agree with the assessment and plans.  Deston Bilyeu A. Jakhi Dishman  MD, FACS  

## 2012-04-14 ENCOUNTER — Other Ambulatory Visit: Payer: Self-pay | Admitting: Oncology

## 2012-04-14 ENCOUNTER — Telehealth: Payer: Self-pay | Admitting: Oncology

## 2012-04-14 LAB — PROTIME-INR: INR: 1.02 (ref 0.00–1.49)

## 2012-04-14 MED ORDER — WARFARIN SODIUM 5 MG PO TABS: 5.0000 mg | ORAL_TABLET | Freq: Every day | ORAL | Status: DC

## 2012-04-14 MED ORDER — DILTIAZEM HCL ER COATED BEADS 240 MG PO CP24: 240.0000 mg | ORAL_CAPSULE | Freq: Every day | ORAL | Status: DC

## 2012-04-14 NOTE — Progress Notes (Signed)
I have seen and examined the patient and agree with the assessment and plans.  Marcques Wrightsman A. Plummer Matich  MD, FACS  

## 2012-04-14 NOTE — Progress Notes (Signed)
Patient ID: Victoria [name], female   DOB: 04-22-1927, 78 y.o.   MRN: [id number] 10 Days Post-Op  Subjective: Pt feels great today.  Eager to go home.  Objective: Vital signs in last 24 hours: Temp:  [97.5 F (36.4 C)-98.5 F (36.9 C)] 97.9 F (36.6 C) (09/20 0500) Pulse Rate:  [94-121] 121  (09/20 0500) Resp:  [18] 18  (09/19 2223) BP: (110-128)/(70-73) 110/70 mmHg (09/20 0500) SpO2:  [97 %-100 %] 100 % (09/20 0500) Last BM Date: 04/13/12  Intake/Output from previous day: 09/19 0701 - 09/20 0700 In: 1015.7 [P.O.:900; I.V.:115.7] Out: 930 [Urine:900; Stool:30] Intake/Output this shift: Total I/O In: 320 [P.O.:320] Out: 300 [Urine:300]  PE: Abd: soft, wound is clean with beefy red granulation tissue.  Ostomy with air and stool.  Will dc remaining staples prior to dc.  g tube in place.  Lab Results:  No results found for this basename: WBC:2,HGB:2,HCT:2,PLT:2 in the last 72 hours BMET  Basename 04/13/12 0423 04/12/12 0525  NA 127* 127*  K 3.6 3.8  CL 96 96  CO2 25 24  GLUCOSE 106* 118*  BUN 11 12  CREATININE 0.60 0.56  CALCIUM 8.2* 8.1*   PT/INR  Basename 04/14/12 0415 04/13/12 1325  LABPROT 13.3 13.1  INR 1.02 1.00   CMP     Component Value Date/Time   NA 127* 04/13/2012 0423   K 3.6 04/13/2012 0423   CL 96 04/13/2012 0423   CO2 25 04/13/2012 0423   GLUCOSE 106* 04/13/2012 0423   BUN 11 04/13/2012 0423   CREATININE 0.60 04/13/2012 0423   CALCIUM 8.2* 04/13/2012 0423   PROT 5.3* 04/13/2012 0423   ALBUMIN 2.0* 04/13/2012 0423   AST 22 04/13/2012 0423   ALT 16 04/13/2012 0423   ALKPHOS 182* 04/13/2012 0423   BILITOT 0.2* 04/13/2012 0423   GFRNONAA 82* 04/13/2012 0423   GFRAA >90 04/13/2012 0423   Lipase  No results found for this basename: lipase       Studies/Results: No results found.  Anti-infectives: Anti-infectives     Start     Dose/Rate Route Frequency Ordered Stop   04/04/12 0800   cefOXitin (MEFOXIN) 2 g in dextrose 5 % 50 mL IVPB        2 g 100  mL/hr over 30 Minutes Intravenous  Once 04/04/12 0734 04/04/12 0946           Assessment/Plan  1. S/p sx lap with sb bypass, colostomy, and g tube placement 2.  Patient Active Problem List  Diagnosis  . Rectal carcinoma  . PAF fib/flutter- Hx RFA 2010  . Chronic anticoagulation  . Diverticulosis  . S/P [facility] Feb 2006 after failed RFA  . Colon cancer  . Dehydration  . Diarrhea  . Leucocytosis  . Cough  . Hyponatremia  . Anemia  . Small bowel obstruction, surgery 04/04/12  . Pelvic mass in female, necrotic   Plan: 1. Will arrange [facility] for ostomy care, wound care, and possibly PICC care.   2. Trying to get a hold of Dr. Truett Perna to determine whether the patient needs her PICC left in for chemo or not. 3. Appreciate cardiology assistance and recommendations. 4. Once all set up, patient is stable for dc home.   LOS: 21 days    Nizar Cutler E 04/14/2012, 9:03 AM Pager: [phone]

## 2012-04-14 NOTE — Discharge Summary (Signed)
I have seen and examined the patient and agree with the assessment and plans.  Annissa Andreoni A. Odin Mariani  MD, FACS  

## 2012-04-14 NOTE — Progress Notes (Signed)
ANTICOAGULATION CONSULT NOTE  Pharmacy Consult for Coumadin Indication: atrial fibrillation  Allergies  Allergen Reactions  . Pindolol Swelling    Throat swelled up  . Iodine   . Shellfish Allergy   . Vibramycin (Doxycycline Calcium) Nausea Only  . Doxycycline Nausea Only    Patient Measurements: Height: 5\' 1"  (154.9 cm) Weight: 146 lb 1.6 oz (66.271 kg) IBW/kg (Calculated) : 47.8   Vital Signs: Temp: 97.9 F (36.6 C) (09/20 0500) Temp src: Oral (09/20 0500) BP: 110/70 mmHg (09/20 0500) Pulse Rate: 121  (09/20 0500)  Labs:  Victoria [name] 04/14/12 0415 04/13/12 1325 04/13/12 0423 04/12/12 0525  HGB -- -- -- --  HCT -- -- -- --  PLT -- -- -- --  APTT -- -- -- --  LABPROT 13.3 13.1 -- --  INR 1.02 1.00 -- --  HEPARINUNFRC -- -- -- --  CREATININE -- -- 0.60 0.56  CKTOTAL -- -- -- --  CKMB -- -- -- --  TROPONINI -- -- -- --    Estimated Creatinine Clearance: 46.4 ml/min (by C-G formula based on Cr of 0.6).  Medications:  Scheduled:     . diltiazem  240 mg Oral Daily  . enoxaparin  40 mg Subcutaneous Q24H  . lactose free nutrition  237 mL Oral TID WC  . metoprolol tartrate  25 mg Oral BID  . warfarin  5 mg Oral ONCE-1800  . warfarin  5 mg Oral q1800  . Warfarin - Pharmacist Dosing Inpatient   Does not apply q1800  . DISCONTD: metoprolol  2.5 mg Intravenous Q6H   Infusions:     . diltiazem (CARDIZEM) infusion Stopped (04/13/12 1534)  . fat emulsion 250 mL (04/12/12 1733)  . TPN (CLINIMIX) +/- additives    . DISCONTD: diltiazem (CARDIZEM) infusion 15 mg/hr (04/13/12 1118)    Assessment:  85 YOF on chronic coumadin for Afib, held on admission for SBO, s/p colostomy  Home dose 5mg  daily, INR supratherapeutic on admit  Coumadin ordered to resume 9/19 as SBO resolving and advancing diet - 5mg  given  INR subtherapeutic as expected  Goal of Therapy:  INR 2-3 Monitor platelets by anticoagulation protocol: Yes   Plan:   Appears plan is to discharge  today  Recommend continuing 5mg  daily, f/u INR as outpatient (ideally Monday)  Continue lovenox 40mg  sq q24h per MD while inpatient  [name], PharmD, BCPS Pager: [phone] 04/14/2012,10:05 AM

## 2012-04-14 NOTE — Discharge Summary (Signed)
Patient ID: Victoria [name] MRN: [id number] DOB/AGE: 1926/10/17 78 y.o.  Admit date: 03/24/2012 Discharge date: 04/14/2012  Procedures:  #1 laparotomy with bypass, and from jejunum to jejunum, of pelvic mass  #2 Stamm gastrostomy  #3 descending loop colostomy  Consults: cardiology, general surgery and oncology  Reason for Admission: Victoria [name] is an 78 year old woman recently found to have a rectal mass. She noted a change in bowel habits in May 2013. Barium enema on 12/22/2011 showed moderately redundant colon with moderate to severe diverticulosis primarily from the splenic flexure and throughout the sigmoid. She developed rectal bleeding in June 2013. Pelvic examination by her gynecologist showed a rectal mass. CT scans of the abdomen and pelvis on 03/02/2012 showed a rectal mass measuring 6.7 cm and 2 adjacent nodal masses in the right mid abdomen measuring 8.9 x 7.4 x 7.2 cm suspicious for nodal metastases and an additional 3.0 x 3.0 x 2.6 cm nodal metastasis posterior to the cecum. A sigmoidoscopy on 03/13/2012 showed a 5 cm malignant appearing mass with central ulceration in the rectum about 2-3 cm proximal to the anal verge. Pathology showed moderate to poorly differentiated adenocarcinoma with ulceration and prolapse changes. She was seen by Dr. Gerrit Friends and also referred to radiation and medical oncology. She was hospitalized on 03/20/2012 with dehydration presenting with loose stools and nausea. She was discharged home on 03/21/2012. PET scan on 03/22/2012 showed an intensely hypermetabolic large mass along the right aspect of the rectum, intensely hypermetabolic right iliac fossa mass and increased caliber of small bowel loops containing air-fluid levels.  She was scheduled to see Dr. Truett Perna next week. She contacted the office today to report abdominal distention and persistent nausea/vomiting since discharge from the hospital. She estimates that she has vomited 6 times today. She describes  the emesis as "bile". She notes marked abdominal distention over the past several days. She last had a bowel movement on 03/20/2012. She has diffuse abdominal pain which is relieved with Tylenol. She denies rectal bleeding. Appetite is poor.   Admission Diagnoses:  1. Pelvic masses with rectal involvement 2. Small bowel obstruction 3. A. Fib 4. HTN 5. CAD  Hospital Course:  1. malignant small bowel obstruction: The patient was admitted to the hospital and an NG tube was placed for decompression. General surgery was consulted to evaluate the patient. Given the findings of pelvic masses, GYN oncology was also asked to evaluate the patient as well. Given her findings, it was felt that the patient would benefit from chemotherapy initially with probable surgery after this was completed. Therefore no acute placing a G-tube in the meantime. Over the next several days after admission, the patient did begin to improve with conservative management. She began to have several small bowel movements. Ultimately, her NG tube was discontinued and she was started on a clear liquid diet. She was able to be advanced to full liquids however she then developed abdominal distention and worsening symptoms. Unfortunately, just as she was getting ready to begin chemotherapy, she redeveloped her bowel obstruction. Given her situation, it was felt that the patient would benefit from a laparotomy for a bypass or repair of her obstruction along with the placement of a gastrostomy tube and a diverting colostomy. This was done on 04/04/2012 by Dr. Emelia Loron. The procedure went well. She had an NG tube and on postoperative day 1 however this was removed and her G-tube was placed to gravity. She had a postoperative ileus as expected for several days. On postoperative  day #6, the patient began having air and stool in her colostomy bag. She is having minimal G-tube drainage. Therefore her G-tube was clamped. Following day she was  tolerating this well with no nausea. Therefore her G-tube continued to remain clamped and her diet was advanced. Over the next several days her diet was advanced as tolerated up to a solid diet. On postoperative day #10, the patient was tolerating a regular diet and her pain was well-controlled. Her wound was left open after surgery except for 3 small staples were placed around her umbilicus. Her wound had been having normal saline wet to dry dressing changes during the postoperative period. Her wound was clean with beefy red granulation tissue at time of discharge. She was otherwise felt stable at this time for discharge home.  2. Paroxysmal atrial fibrillation:  The patient developed atrial fibrillation several days after admission. Cardiology was consulted. She was placed on IV rate control with diltiazem. This controlled her rate very well throughout much of her hospitalization. The patient was able to tolerate a solid diet after surgery, the patient was transitioned from IV diltiazem to oral diltiazem. She was started on Coumadin the day prior to discharge, as she remained in H. were fibrillation throughout her hospitalization. Cardiology will plan for a TEE next week.  3. Pelvic masses felt to be GYN in origin:  These remained stable during her admission. She is to followup with Dr. Truett Perna on October 2 to start chemotherapy. Further recommendations will be made per Dr. Truett Perna.  4. HTN:  This remained stable throughout her hospitalization. She was kept on her metoprolol throughout her hospitalization.  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 For pain.      diltiazem 240 MG 24 hr capsule   Commonly known as: CARDIZEM CD   Take 1 capsule (240 mg total) by mouth daily.      metoprolol succinate 25 MG 24 hr tablet   Commonly known as: TOPROL-XL   Take 25 mg by mouth daily.      ondansetron 4 MG tablet   Commonly known as: ZOFRAN   Take 4 mg by mouth every 8 (eight) hours as needed.      senna-docusate 8.6-50 MG per tablet   Commonly known as: Senokot-S   Take 1 tablet by mouth at bedtime as needed.      simvastatin 20 MG tablet   Commonly known as: ZOCOR   Take 20 mg by mouth every evening.        Discharge Instructions:     Follow-up Information    Follow up with Thurmon Fair, MD. On 04/19/2012. (at 3:40 pm for coumadin check with Baxter Hire)    Contact information:   [address] [place] [address] [phone]       Follow up with Thurmon Fair, MD. On 04/21/2012. (  at [facility] for echo and cardioversion to regular rhythm, we will call with date and time)    Contact information:   [address] [place] [address] [phone]       Follow up with Thornton Papas, MD.   Contact information:   [address] [place] [address] [phone] October 2      Follow up with Emelia Loron, MD. Schedule an appointment as soon as possible for a visit in 2 weeks.   Contact information:   [address] [place] [address] [phone]          Signed: Letha Cape 04/14/2012, 9:12 AM

## 2012-04-14 NOTE — Telephone Encounter (Signed)
lmonvm adviisng the pt of her f/u appt with dr Truett Perna in oct.

## 2012-04-17 ENCOUNTER — Encounter: Payer: Self-pay | Admitting: Internal Medicine

## 2012-04-19 ENCOUNTER — Ambulatory Visit: Payer: Medicare Other | Admitting: Nurse Practitioner

## 2012-04-20 ENCOUNTER — Other Ambulatory Visit: Payer: Self-pay | Admitting: Cardiovascular Disease

## 2012-04-21 ENCOUNTER — Encounter (HOSPITAL_COMMUNITY)
Admission: RE | Disposition: A | Discharge status: Home / Self Care | Payer: Self-pay | Source: Ambulatory Visit | Attending: Cardiovascular Disease

## 2012-04-21 ENCOUNTER — Ambulatory Visit (HOSPITAL_COMMUNITY)
Admission: RE | Admit: 2012-04-21 | Discharge: 2012-04-21 | Disposition: A | Discharge status: Home / Self Care | Payer: Medicare Other | Source: Ambulatory Visit | Attending: Cardiovascular Disease | Admitting: Cardiovascular Disease

## 2012-04-21 SURGERY — Surgical Case

## 2012-04-21 MED ORDER — SODIUM CHLORIDE 0.9 % IV SOLN: INTRAVENOUS | Status: DC

## 2012-04-21 NOTE — OR Nursing (Signed)
Pt admitted for TEE/cardioversion.  Strip indicates NSR.  MD notified.  He arrives and confirms NSR.  IV removed and patient discharged.

## 2012-04-21 NOTE — Progress Notes (Signed)
Recently discharged patient with atrial fibrillation was scheduled for TEE and synchronized cardioversion, but presents today in NSR. On warfarin anticoagulation. Procedure cancelled. Has follow-up scheduled next month. Victoria Fair, MD, [facility] [phone] office [phone] pager

## 2012-04-26 ENCOUNTER — Ambulatory Visit (HOSPITAL_BASED_OUTPATIENT_CLINIC_OR_DEPARTMENT_OTHER): Payer: Medicare Other | Admitting: Nurse Practitioner

## 2012-04-26 ENCOUNTER — Ambulatory Visit: Payer: Medicare Other | Admitting: Nurse Practitioner

## 2012-04-26 ENCOUNTER — Telehealth: Payer: Self-pay | Admitting: Oncology

## 2012-04-26 VITALS — BP 128/74 | HR 68 | Temp 97.0°F | Resp 20 | Ht 61.0 in | Wt 128.0 lb

## 2012-04-26 DIAGNOSIS — R198 Other specified symptoms and signs involving the digestive system and abdomen: Secondary | ICD-10-CM

## 2012-04-26 DIAGNOSIS — C569 Malignant neoplasm of unspecified ovary: Secondary | ICD-10-CM

## 2012-04-26 DIAGNOSIS — Z7901 Long term (current) use of anticoagulants: Secondary | ICD-10-CM

## 2012-04-26 DIAGNOSIS — C579 Malignant neoplasm of female genital organ, unspecified: Secondary | ICD-10-CM

## 2012-04-26 DIAGNOSIS — I4891 Unspecified atrial fibrillation: Secondary | ICD-10-CM

## 2012-04-26 DIAGNOSIS — C801 Malignant (primary) neoplasm, unspecified: Secondary | ICD-10-CM

## 2012-04-26 NOTE — Telephone Encounter (Signed)
appts made and printed for pt,called [facility] and they will see her also for her port placement on 10/11

## 2012-04-26 NOTE — Progress Notes (Signed)
OFFICE PROGRESS NOTE  Interval history:  Victoria [name] returns as scheduled. On 04/04/2012 she underwent a laparotomy with bypass from jejunum to jejunum of pelvic mass and descending loop colostomy. She did well following surgery and was discharged home on 04/14/2012.  She overall is feeling well. She has a good appetite. No nausea or vomiting. The colostomy is functioning. She denies fever. No shortness of breath. No abdominal pain. She reports the abdominal wounds are continuing to heal.   Objective: Blood pressure 128/74, pulse 68, temperature 97 F (36.1 C), temperature source Oral, resp. rate 20, height 5\' 1"  (1.549 m), weight 128 lb (58.06 kg).  Oropharynx is without thrush or ulceration. Lungs are clear. Regular cardiac rhythm. Abdomen is soft and nontender. No hepatomegaly. Extremities are without edema. The lower midline abdominal wound is packed. The wound edges appear clean.  Lab Results: Lab Results  Component Value Date   WBC 10.3 04/10/2012   HGB 9.5* 04/10/2012   HCT 28.2* 04/10/2012   MCV 84.9 04/10/2012   PLT 309 04/10/2012    Chemistry:    Chemistry      Component Value Date/Time   NA 127* 04/13/2012 0423   K 3.6 04/13/2012 0423   CL 96 04/13/2012 0423   CO2 25 04/13/2012 0423   BUN 11 04/13/2012 0423   CREATININE 0.60 04/13/2012 0423      Component Value Date/Time   CALCIUM 8.2* 04/13/2012 0423   ALKPHOS 182* 04/13/2012 0423   AST 22 04/13/2012 0423   ALT 16 04/13/2012 0423   BILITOT 0.2* 04/13/2012 0423       Studies/Results: Ct Abdomen Pelvis W Contrast  04/03/2012  *RADIOLOGY REPORT*  Clinical Data: Abdominal distention.  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 Probable surgery tomorrow.  CT ABDOMEN AND PELVIS WITH CONTRAST  Technique:  Multidetector CT imaging of the abdomen and pelvis was performed following the standard protocol during bolus administration of intravenous contrast.  Contrast: OMNIPAQUE IOHEXOL  300 MG/ML  SOLN  Comparison: Abdomen and pelvis CT dated 03/02/2012 and PET CT dated 03/22/2012.  Findings: Nasogastric tube tip in the region of the distal stomach, somewhat difficult to assess due to patient motion.  Multiple significantly dilated proximal small bowel loops with normal caliber distal loops.  The exact level of the obstruction is difficult to assess due to patient motion degrading the images through the mid and upper abdomen.  However, there is a less pronounced dilated small bowel loop which tapers gradually to an area of angulation at the location of the previously demonstrated mass in the pelvis on the right.  The main component of pelvic mass on the right measured 8.9 x 7.4 cm in maximum dimensions in the transverse plane on 03/02/2012 and currently measures 9.4 x 6.8 cm in corresponding dimensions on image number 55 of series 2.  A smaller adjacent mass on the right is becoming confluent with the larger mass on the current images.  There is a third mass in the inferior pelvis on the right, invading the rectum with interval central cavitation.  This mass previously measured 6.7 x 5.3 cm in maximum dimensions on 03/02/2012 and currently measures 6.9 x 6.2 cm in corresponding dimensions on image number 71 of series 2.  The pelvic masses appear confluent on sagittal image number 56, measuring 16.8 cm in maximum diameter.  On coronal image number 66, there is perianal extension of tumor measuring 5.3 x 3.7 cm.  No enlarged lymph  nodes are identified.  The adrenal glands, liver, spleen, pancreas, gallbladder and right kidney are grossly unremarkable.  The left kidney remains somewhat small.  Interval small bilateral pleural effusions and adjacent bibasilar atelectasis, not included in its entirety.  Edema in the subcutaneous fat laterally on both sides.  The previously seen small amount of free peritoneal fluid is no longer demonstrated.  Multiple colonic diverticula are noted without evidence of  diverticulitis.  No enlarged lymph nodes are seen.  Lumbar and lower thoracic spine degenerative changes are noted.  IMPRESSION:  1.  Confluent pelvic masses on the right, measuring 16.8 cm in maximum diameter, invading the rectum and causing small bowel obstruction, as described above. 2.  Limited evaluation of the upper mid abdomen due to patient motion. 3.  Interval small bilateral pleural effusions and bibasilar atelectasis. 4.  Extensive colonic diverticulosis.   Original Report Authenticated By: Darrol Angel, M.D.    Dg Chest Port 1 View  03/27/2012  *RADIOLOGY REPORT*  Clinical Data: Bedside PICC placement.  PORTABLE CHEST - 1 VIEW 03/27/2012 1504 hours:  Comparison: Two-view chest x-ray 03/20/2012.  Findings: Right arm PICC tip projects over the mid SVC. Nasogastric tube looped in the stomach with its tip in the mid body.  Cardiac silhouette enlarged but stable, allowing for differences in technique.  Interval development of atelectasis in both lung bases.  Stable scarring in the right upper lobe.  Lungs otherwise clear.  Left subclavian dual lead transvenous pacemaker unchanged and appears intact.  IMPRESSION:  1.  Right arm PICC tip projects over the mid SVC. 2.  New bibasilar atelectasis.  No acute cardiopulmonary disease otherwise. 3.  Stable cardiomegaly without pulmonary edema. 4.  Nasogastric tube looped in the stomach with its tip in the mid body.   Original Report Authenticated By: Arnell Sieving, M.D.    Dg Abd 2 Views  04/01/2012  *RADIOLOGY REPORT*  Clinical Data: Follow up small bowel obstruction  ABDOMEN - 2 VIEW  Comparison: Prior radiograph yesterday, 03/31/2012  Findings: Nasogastric tube remains in good position.  The tip projects over the gastric body.  Interval decompression of the stomach.  Persistent gaseous dilatation of loops of small bowel in the left hemiabdomen.  Small bowel measures up to 5.3 cm in the left upper quadrant.  No evidence of free air.  Mild bibasilar  atelectasis.  Incompletely imaged pacemaker leads project over the right atrium right ventricle.  Multilevel degenerative disc disease with rotational levoconvex scoliosis centered at L3.  Numerous vascular flow voids project over the anatomic pelvis.  IMPRESSION:  1.  Stable position of nasogastric tube with successful decompression of the stomach. 2.  Persistent dilated and gas filled loops of small bowel in the left hemiabdomen measuring up to 5.3 cm in diameter in the left upper quadrant.   Original Report Authenticated By: Alvino Blood Abd 2 Views  03/31/2012  *RADIOLOGY REPORT*  Clinical Data: Distended abdomen.  Evaluate for recurrent small bowel obstruction.  ABDOMEN - 2 VIEW  Comparison: Nine 09/03/2011.  Findings: A nasogastric tube is seen extending into the stomach. There is a paucity of distal colonic gas.  Multiple dilated loops of gas-filled small bowel are noted in the central abdomen and left side of the abdomen, measuring up to 5.1 cm in diameter.  Air fluid levels are present on the left lateral decubitus view.  No gross evidence of pneumoperitoneum.  IMPRESSION: 1.  Findings, as above, compatible with persistent small bowel obstruction. 2.  No pneumoperitoneum.   Original Report Authenticated By: Florencia Reasons, M.D.     Medications: I have reviewed the patient's current medications.  Assessment/Plan:  1. Rectal mass status post sigmoidoscopy 03/13/2012 with findings of a 5 cm malignant appearing mass with central ulceration in the rectum 2-3 cm proximal to the anal verge. Pathology showed moderate to poorly differentiated adenocarcinoma with ulceration and prolapse changes. By immunohistochemistry the malignant cells were positive for cytokeratin 7, estrogen receptor and WT-1. They were negative for cytokeratin 20 and CDX-2. This immunohistochemical profile was strongly suggestive of a gynecologic primary. 2. CT abdomen/pelvis on 03/02/2012 with a 6.7 cm mass along the right aspect  of the rectum; 2 adjacent nodal masses in the right mid abdomen measuring 8.9 x 7.4 x 7.2 cm and an additional 3.0 x 3.0 x 2.6 cm nodal mass posterior to the cecum. 3. PET scan 03/22/2012 with an intensely hypermetabolic large mass along the right aspect of the rectum; an intensely hypermetabolic right iliac fossa mass; increased caliber of small bowel loops containing air-fluid levels. 4. Hospitalization with nausea/vomiting, abdominal distention due to a small bowel obstruction status post bypass procedure and descending loop colostomy 04/04/2012. 5. Atrial fibrillation maintained on Coumadin.  Disposition-Ms. Direnzo appears to have ovarian cancer. She continues to recover from the recent bowel obstruction and surgery. We discussed chemotherapy with carboplatin/Taxol. We reviewed potential toxicities associated with chemotherapy including myelosuppression, nausea, hair loss. We discussed the potential for an allergic reaction, possibly severe. We discussed arthralgias and neuropathy associated with Taxol. She will attend a chemotherapy education class. We are making a referral to Dr. Dwain Sarna for placement of a Port-A-Cath.  The abdominal wounds continue to heal. She has a followup visit with Dr. Dwain Sarna on 05/05/2012.  Dr. Truett Perna will see her in followup on 05/08/2012 to discuss the timing of initiation of chemotherapy. We will repeat the CA 125 tumor marker at that time. She will contact the office in the interim with any problems.  Patient seen with Dr. Truett Perna.    Victoria [name] ANP/GNP-BC

## 2012-04-27 ENCOUNTER — Other Ambulatory Visit (INDEPENDENT_AMBULATORY_CARE_PROVIDER_SITE_OTHER): Payer: Self-pay | Admitting: General Surgery

## 2012-04-28 ENCOUNTER — Telehealth (INDEPENDENT_AMBULATORY_CARE_PROVIDER_SITE_OTHER): Payer: Self-pay

## 2012-04-28 NOTE — Telephone Encounter (Signed)
Her portacath insertion is 10/10 and she needs to know when to stop her Coumadin.  Please call.

## 2012-04-28 NOTE — Telephone Encounter (Signed)
Did we ever hear back on when we should stop her coumadin?  Her surgery is 10/10 so if she needs to stop 5 days before then I need to try and call her today.

## 2012-05-01 ENCOUNTER — Telehealth (INDEPENDENT_AMBULATORY_CARE_PROVIDER_SITE_OTHER): Payer: Self-pay | Admitting: General Surgery

## 2012-05-01 NOTE — Telephone Encounter (Signed)
Message copied by Littie Deeds on Mon May 01, 2012 12:30 PM ------      Message from: [place], [place]      Created: Mon May 01, 2012 11:31 AM      Regarding: RE:        No. I meant to send to my nurse. Sorry      Penni Bombard. See below                  ----- Message -----         From: Ladene Artist, MD         Sent: 05/01/2012  10:26 AM           To: Emelia Loron, MD      Subject: RE:                                                      Do you need me to check this?      ----- Message -----         From: Emelia Loron, MD         Sent: 04/30/2012   3:07 PM           To: Ladene Artist, MD      Subject: RE:                                                      She needs to stop ASAP then check pt inr day of surgery.                  ----- Message -----         From: Ladene Artist, MD         Sent: 04/30/2012  11:22 AM           To: Emelia Loron, MD             I believe she is on coumadin for afib.  Ok to stop for the procedure and resume post port placement.      Brad            ----- Message -----         From: Emelia Loron, MD         Sent: 04/28/2012  12:23 PM           To: Ladene Artist, MD            Brad,      What should I do with her anticoagulation around port placement?      Matt

## 2012-05-01 NOTE — Telephone Encounter (Signed)
Spoke with pt's daughter-in law and explained that she needs to stop her coumadin ASAP.  She said they would do this.

## 2012-05-02 ENCOUNTER — Encounter (HOSPITAL_COMMUNITY): Payer: Self-pay | Admitting: Pharmacy Technician

## 2012-05-03 ENCOUNTER — Encounter (HOSPITAL_COMMUNITY): Payer: Self-pay

## 2012-05-03 ENCOUNTER — Other Ambulatory Visit: Payer: Medicare Other

## 2012-05-03 ENCOUNTER — Encounter (HOSPITAL_COMMUNITY)
Admission: RE | Admit: 2012-05-03 | Discharge: 2012-05-03 | Disposition: A | Discharge status: Home / Self Care | Payer: Medicare Other | Source: Ambulatory Visit | Attending: General Surgery | Admitting: General Surgery

## 2012-05-03 HISTORY — DX: Colostomy status: Z93.3

## 2012-05-03 LAB — CBC WITH DIFFERENTIAL/PLATELET
Basophils Absolute: 0 10*3/uL (ref 0.0–0.1)
Basophils Relative: 0 % (ref 0–1)
Eosinophils Relative: 1 % (ref 0–5)
HCT: 34.3 % — ABNORMAL LOW (ref 36.0–46.0)
Hemoglobin: 11.3 g/dL — ABNORMAL LOW (ref 12.0–15.0)
MCHC: 32.9 g/dL (ref 30.0–36.0)
MCV: 87.1 fL (ref 78.0–100.0)
Monocytes Absolute: 0.6 10*3/uL (ref 0.1–1.0)
Monocytes Relative: 7 % (ref 3–12)
Neutro Abs: 7.1 10*3/uL (ref 1.7–7.7)
RDW: 15.6 % — ABNORMAL HIGH (ref 11.5–15.5)

## 2012-05-03 LAB — BASIC METABOLIC PANEL
BUN: 12 mg/dL (ref 6–23)
CO2: 27 mEq/L (ref 19–32)
Chloride: 95 mEq/L — ABNORMAL LOW (ref 96–112)
Creatinine, Ser: 0.74 mg/dL (ref 0.50–1.10)
GFR calc Af Amer: 89 mL/min — ABNORMAL LOW (ref >90)
Glucose, Bld: 125 mg/dL — ABNORMAL HIGH (ref 70–99)
Potassium: 4.2 mEq/L (ref 3.5–5.1)

## 2012-05-03 NOTE — Progress Notes (Signed)
Dr. Dwain Sarna,            Mrs. Orrison's preop pt, ptt elevated--results in EPIC.  Her last coumadin was Sunday night 10/6.  I have placed order in epic - per anesthesiologist's guidelines-to repeat pt in am (day of her surgery).                     Antonietta Breach RN

## 2012-05-03 NOTE — Pre-Procedure Instructions (Signed)
CBC, DIFF, BMET, PT, PTT, PCR, EKG WERE DONE TODAY AT [facility].  PREVIOUS EKG REPORT IN EPIC FROM 04/15/12 WAS ABNORMAL. CXR REPORT 03/27/12 IS IN EPIC.Marland Kitchen PERIOPERATIVE PRESCRIPTION FOR IMPLANTED CARDIAC DEVICE PROGRAMMING ON PT'S CHART FROM DR.CROITORU  - AND I NOTIFIED BRIAN SMALL -ST JUDE REP THAT ORDERS REQUEST  POST OP INTERROGATION OF PT'S PACEMAKER AND SURGERY IS AT 1:30 PM TOMORROW 05/04/12 AT [facility].   LAST CARDIOLOGY OFFICE NOTE 04/19/12, LAST PACEMAKER INTERROGATION NOTES 03/27/12 ARE ON PT'S CHART FROM [facility].

## 2012-05-03 NOTE — Patient Instructions (Signed)
YOUR SURGERY IS SCHEDULED AT [facility]  ON:  Thursday  10/10  AT 1:30 PM  REPORT TO [place] SHORT STAY CENTER AT:  11:30 AM      PHONE # FOR SHORT STAY IS [phone]  DO NOT EAT  ANYTHING AFTER MIDNIGHT TONIGHT.   NO FOOD, NO CHEWING GUM, NO MINTS, NO CANDIES, NO CHEWING TOBACCO.  YOU MAY HAVE CLEAR LIQUIDS TO DRINK FROM MIDNIGHT TONIGHT UNTIL 7:30 AM DAY OF SURGERY--LIKE WATER, TEA.     NOTHING TO DRINK AFTER 7:30 AM!!!!  PLEASE TAKE THE FOLLOWING MEDICATIONS THE AM OF YOUR SURGERY WITH A FEW SIPS OF WATER:   DILTIAZEM AND METOPROLOL.   IF YOU USE INHALERS--USE YOUR INHALERS THE AM OF YOUR SURGERY AND BRING INHALERS TO THE HOSPITAL -TAKE TO SURGERY.    IF YOU ARE DIABETIC:  DO NOT TAKE ANY DIABETIC MEDICATIONS THE AM OF YOUR SURGERY.  IF YOU TAKE INSULIN IN THE EVENINGS--PLEASE ONLY TAKE 1/2 NORMAL EVENING DOSE THE NIGHT BEFORE YOUR SURGERY.  NO INSULIN THE AM OF YOUR SURGERY.  IF YOU HAVE SLEEP APNEA AND USE CPAP OR BIPAP--PLEASE BRING THE MASK AND THE TUBING.  DO NOT BRING YOUR MACHINE.  DO NOT BRING VALUABLES, MONEY, CREDIT CARDS.  DO NOT WEAR JEWELRY, MAKE-UP, NAIL POLISH AND NO METAL PINS OR CLIPS IN YOUR HAIR. CONTACT LENS, DENTURES / PARTIALS, GLASSES SHOULD NOT BE WORN TO SURGERY AND IN MOST CASES-HEARING AIDS WILL NEED TO BE REMOVED.  BRING YOUR GLASSES CASE, ANY EQUIPMENT NEEDED FOR YOUR CONTACT LENS. FOR PATIENTS ADMITTED TO THE HOSPITAL--CHECK OUT TIME THE DAY OF DISCHARGE IS 11:00 AM.  ALL INPATIENT ROOMS ARE PRIVATE - WITH BATHROOM, TELEPHONE, TELEVISION AND WIFI INTERNET.  IF YOU ARE BEING DISCHARGED THE SAME DAY OF YOUR SURGERY--YOU CAN NOT DRIVE YOURSELF HOME--AND SHOULD NOT GO HOME ALONE BY TAXI OR BUS.  NO DRIVING OR OPERATING MACHINERY FOR 24 HOURS FOLLOWING ANESTHESIA / PAIN MEDICATIONS.  PLEASE MAKE ARRANGEMENTS FOR SOMEONE TO BE WITH YOU AT HOME THE FIRST 24 HOURS AFTER SURGERY. RESPONSIBLE DRIVER'S NAME_ SHELLEY Amory - PT'S DAUGHTER IN LAW   [phone]                                                                                 PLEASE READ OVER ANY  FACT SHEETS THAT YOU WERE GIVEN: MRSA INFORMATION

## 2012-05-03 NOTE — Pre-Procedure Instructions (Signed)
NOTE FAXED IN EPIC TO DR. Dwain Sarna REGARDING ABNORMAL PREOP PT, PTT.

## 2012-05-04 ENCOUNTER — Ambulatory Visit (HOSPITAL_COMMUNITY): Payer: Medicare Other | Admitting: Anesthesiology

## 2012-05-04 ENCOUNTER — Ambulatory Visit (HOSPITAL_COMMUNITY): Payer: Medicare Other

## 2012-05-04 ENCOUNTER — Ambulatory Visit (HOSPITAL_COMMUNITY)
Admission: RE | Admit: 2012-05-04 | Discharge: 2012-05-04 | Disposition: A | Discharge status: Home / Self Care | Payer: Medicare Other | Source: Ambulatory Visit | Attending: General Surgery | Admitting: General Surgery

## 2012-05-04 ENCOUNTER — Encounter (HOSPITAL_COMMUNITY): Payer: Self-pay | Admitting: *Deleted

## 2012-05-04 ENCOUNTER — Encounter (HOSPITAL_COMMUNITY): Payer: Self-pay | Admitting: Anesthesiology

## 2012-05-04 ENCOUNTER — Encounter (HOSPITAL_COMMUNITY)
Admission: RE | Disposition: A | Discharge status: Home / Self Care | Payer: Self-pay | Source: Ambulatory Visit | Attending: General Surgery

## 2012-05-04 DIAGNOSIS — C569 Malignant neoplasm of unspecified ovary: Secondary | ICD-10-CM

## 2012-05-04 DIAGNOSIS — I4891 Unspecified atrial fibrillation: Secondary | ICD-10-CM | POA: Insufficient documentation

## 2012-05-04 DIAGNOSIS — I2510 Atherosclerotic heart disease of native coronary artery without angina pectoris: Secondary | ICD-10-CM | POA: Insufficient documentation

## 2012-05-04 DIAGNOSIS — I10 Essential (primary) hypertension: Secondary | ICD-10-CM | POA: Insufficient documentation

## 2012-05-04 HISTORY — PX: PORTACATH PLACEMENT: SHX2246

## 2012-05-04 LAB — PROTIME-INR: INR: 1.37 (ref 0.00–1.49)

## 2012-05-04 SURGERY — INSERTION, TUNNELED CENTRAL VENOUS DEVICE, WITH PORT
Anesthesia: General | Site: Chest | Laterality: Right | Wound class: Clean

## 2012-05-04 MED ORDER — SODIUM CHLORIDE 0.9 % IV SOLN: INTRAVENOUS | Status: DC

## 2012-05-04 MED ORDER — MEPERIDINE HCL 50 MG/ML IJ SOLN: 6.2500 mg | INTRAMUSCULAR | Status: DC | PRN

## 2012-05-04 MED ORDER — EPHEDRINE SULFATE 50 MG/ML IJ SOLN
INTRAMUSCULAR | Status: DC | PRN
  Administered 2012-05-04 (×3): 5 mg via INTRAVENOUS

## 2012-05-04 MED ORDER — OXYCODONE HCL 5 MG PO TABS: 5.0000 mg | ORAL_TABLET | ORAL | Status: DC | PRN

## 2012-05-04 MED ORDER — MUPIROCIN 2 % EX OINT
TOPICAL_OINTMENT | Freq: Two times a day (BID) | CUTANEOUS | Status: DC
  Administered 2012-05-04: 1 via NASAL
  Filled 2012-05-04: qty 22

## 2012-05-04 MED ORDER — OXYCODONE-ACETAMINOPHEN 5-325 MG PO TABS: 1.0000 | ORAL_TABLET | ORAL | Status: DC | PRN

## 2012-05-04 MED ORDER — PROMETHAZINE HCL 25 MG/ML IJ SOLN: 6.2500 mg | INTRAMUSCULAR | Status: DC | PRN

## 2012-05-04 MED ORDER — LIDOCAINE HCL (CARDIAC) 20 MG/ML IV SOLN
INTRAVENOUS | Status: DC | PRN
  Administered 2012-05-04: 60 mg via INTRAVENOUS

## 2012-05-04 MED ORDER — BUPIVACAINE HCL (PF) 0.25 % IJ SOLN
INTRAMUSCULAR | Status: AC
  Filled 2012-05-04: qty 30

## 2012-05-04 MED ORDER — HYDROMORPHONE HCL PF 1 MG/ML IJ SOLN: 0.2500 mg | INTRAMUSCULAR | Status: DC | PRN

## 2012-05-04 MED ORDER — HEPARIN SOD (PORK) LOCK FLUSH 100 UNIT/ML IV SOLN
INTRAVENOUS | Status: AC
  Filled 2012-05-04: qty 5

## 2012-05-04 MED ORDER — PROPOFOL 10 MG/ML IV BOLUS
INTRAVENOUS | Status: DC | PRN
  Administered 2012-05-04: 120 mg via INTRAVENOUS

## 2012-05-04 MED ORDER — HEPARIN SOD (PORK) LOCK FLUSH 100 UNIT/ML IV SOLN
INTRAVENOUS | Status: DC | PRN
  Administered 2012-05-04: 500 [IU] via INTRAVENOUS

## 2012-05-04 MED ORDER — CEFAZOLIN SODIUM-DEXTROSE 2-3 GM-% IV SOLR
2.0000 g | INTRAVENOUS | Status: AC
  Administered 2012-05-04: 2 g via INTRAVENOUS

## 2012-05-04 MED ORDER — SODIUM CHLORIDE 0.9 % IR SOLN
Status: DC | PRN
  Administered 2012-05-04: 15:00:00

## 2012-05-04 MED ORDER — ONDANSETRON HCL 4 MG/2ML IJ SOLN
INTRAMUSCULAR | Status: DC | PRN
  Administered 2012-05-04: 4 mg via INTRAVENOUS

## 2012-05-04 MED ORDER — FENTANYL CITRATE 0.05 MG/ML IJ SOLN
INTRAMUSCULAR | Status: DC | PRN
  Administered 2012-05-04 (×2): 25 ug via INTRAVENOUS
  Administered 2012-05-04: 50 ug via INTRAVENOUS

## 2012-05-04 MED ORDER — ACETAMINOPHEN 10 MG/ML IV SOLN: 1000.0000 mg | Freq: Once | INTRAVENOUS | Status: DC | PRN

## 2012-05-04 MED ORDER — MORPHINE SULFATE 10 MG/ML IJ SOLN: 2.0000 mg | INTRAMUSCULAR | Status: DC | PRN

## 2012-05-04 MED ORDER — LACTATED RINGERS IV SOLN
INTRAVENOUS | Status: DC | PRN
  Administered 2012-05-04 (×2): via INTRAVENOUS

## 2012-05-04 MED ORDER — SODIUM CHLORIDE 0.9 % IJ SOLN: 3.0000 mL | Freq: Two times a day (BID) | INTRAMUSCULAR | Status: DC

## 2012-05-04 MED ORDER — OXYCODONE HCL 5 MG PO TABS: 5.0000 mg | ORAL_TABLET | Freq: Once | ORAL | Status: DC | PRN

## 2012-05-04 MED ORDER — BUPIVACAINE HCL (PF) 0.25 % IJ SOLN
INTRAMUSCULAR | Status: DC | PRN
  Administered 2012-05-04: 10 mL

## 2012-05-04 MED ORDER — LACTATED RINGERS IV SOLN: INTRAVENOUS | Status: DC

## 2012-05-04 MED ORDER — SODIUM CHLORIDE 0.9 % IR SOLN
Freq: Once | Status: AC
  Filled 2012-05-04 (×2): qty 1.2

## 2012-05-04 MED ORDER — SODIUM CHLORIDE 0.9 % IJ SOLN: 3.0000 mL | INTRAMUSCULAR | Status: DC | PRN

## 2012-05-04 MED ORDER — ACETAMINOPHEN 650 MG RE SUPP
650.0000 mg | RECTAL | Status: DC | PRN
  Filled 2012-05-04: qty 1

## 2012-05-04 MED ORDER — SODIUM CHLORIDE 0.9 % IR SOLN
Status: DC | PRN
  Administered 2012-05-04: 1

## 2012-05-04 MED ORDER — ONDANSETRON HCL 4 MG/2ML IJ SOLN: 4.0000 mg | Freq: Four times a day (QID) | INTRAMUSCULAR | Status: DC | PRN

## 2012-05-04 MED ORDER — OXYCODONE HCL 5 MG/5ML PO SOLN
5.0000 mg | Freq: Once | ORAL | Status: DC | PRN
  Filled 2012-05-04: qty 5

## 2012-05-04 MED ORDER — SODIUM CHLORIDE 0.9 % IV SOLN: 250.0000 mL | INTRAVENOUS | Status: DC | PRN

## 2012-05-04 MED ORDER — CEFAZOLIN SODIUM-DEXTROSE 2-3 GM-% IV SOLR
INTRAVENOUS | Status: AC
  Filled 2012-05-04: qty 50

## 2012-05-04 MED ORDER — ACETAMINOPHEN 325 MG PO TABS: 650.0000 mg | ORAL_TABLET | ORAL | Status: DC | PRN

## 2012-05-04 MED ORDER — LIDOCAINE-EPINEPHRINE (PF) 1 %-1:200000 IJ SOLN
INTRAMUSCULAR | Status: DC | PRN
  Administered 2012-05-04: 10 mL

## 2012-05-04 MED ORDER — LIDOCAINE-EPINEPHRINE (PF) 1 %-1:200000 IJ SOLN
INTRAMUSCULAR | Status: AC
  Filled 2012-05-04: qty 10

## 2012-05-04 SURGICAL SUPPLY — 48 items
ADH SKN CLS APL DERMABOND .7 (GAUZE/BANDAGES/DRESSINGS) ×1
APL SKNCLS STERI-STRIP NONHPOA (GAUZE/BANDAGES/DRESSINGS)
BAG DECANTER FOR FLEXI CONT (MISCELLANEOUS) ×2 IMPLANT
BENZOIN TINCTURE PRP APPL 2/3 (GAUZE/BANDAGES/DRESSINGS) IMPLANT
BLADE HEX COATED 2.75 (ELECTRODE) ×2 IMPLANT
BLADE SURG 15 STRL LF DISP TIS (BLADE) ×1 IMPLANT
BLADE SURG 15 STRL SS (BLADE) ×2
BLADE SURG SZ11 CARB STEEL (BLADE) ×2 IMPLANT
CLOTH BEACON ORANGE TIMEOUT ST (SAFETY) ×2 IMPLANT
DECANTER SPIKE VIAL GLASS SM (MISCELLANEOUS) ×2 IMPLANT
DERMABOND ADVANCED (GAUZE/BANDAGES/DRESSINGS) ×1
DERMABOND ADVANCED .7 DNX12 (GAUZE/BANDAGES/DRESSINGS) IMPLANT
DRAPE C-ARM 42X72 X-RAY (DRAPES) ×2 IMPLANT
DRAPE LAPAROSCOPIC ABDOMINAL (DRAPES) ×2 IMPLANT
DRSG TEGADERM 2-3/8X2-3/4 SM (GAUZE/BANDAGES/DRESSINGS) IMPLANT
DRSG TEGADERM 4X4.75 (GAUZE/BANDAGES/DRESSINGS) IMPLANT
ELECT REM PT RETURN 9FT ADLT (ELECTROSURGICAL) ×2
ELECTRODE REM PT RTRN 9FT ADLT (ELECTROSURGICAL) ×1 IMPLANT
GAUZE SPONGE 4X4 16PLY XRAY LF (GAUZE/BANDAGES/DRESSINGS) ×2 IMPLANT
GLOVE BIO SURGEON STRL SZ7 (GLOVE) ×4 IMPLANT
GLOVE BIOGEL PI IND STRL 7.0 (GLOVE) ×1 IMPLANT
GLOVE BIOGEL PI IND STRL 7.5 (GLOVE) ×3 IMPLANT
GLOVE BIOGEL PI INDICATOR 7.0 (GLOVE) ×1
GLOVE BIOGEL PI INDICATOR 7.5 (GLOVE) ×1
GLOVE SURG SS PI 6.5 STRL IVOR (GLOVE) ×1 IMPLANT
GOWN PREVENTION PLUS LG XLONG (DISPOSABLE) ×1 IMPLANT
GOWN PREVENTION PLUS XLARGE (GOWN DISPOSABLE) ×2 IMPLANT
GOWN STRL NON-REIN LRG LVL3 (GOWN DISPOSABLE) ×1 IMPLANT
GOWN STRL REIN XL XLG (GOWN DISPOSABLE) ×2 IMPLANT
KIT BASIN OR (CUSTOM PROCEDURE TRAY) ×2 IMPLANT
KIT PORT POWER ISP 8FR (Catheter) IMPLANT
KIT POWER CATH 8FR (Catheter) ×1 IMPLANT
NDL HYPO 25X1 1.5 SAFETY (NEEDLE) ×1 IMPLANT
NEEDLE HYPO 25X1 1.5 SAFETY (NEEDLE) ×2 IMPLANT
NS IRRIG 1000ML POUR BTL (IV SOLUTION) ×2 IMPLANT
PACK BASIC VI WITH GOWN DISP (CUSTOM PROCEDURE TRAY) ×2 IMPLANT
PENCIL BUTTON HOLSTER BLD 10FT (ELECTRODE) ×2 IMPLANT
SPONGE GAUZE 4X4 12PLY (GAUZE/BANDAGES/DRESSINGS) IMPLANT
STRIP CLOSURE SKIN 1/2X4 (GAUZE/BANDAGES/DRESSINGS) IMPLANT
SUT MNCRL AB 4-0 PS2 18 (SUTURE) ×2 IMPLANT
SUT PROLENE 2 0 SH DA (SUTURE) ×2 IMPLANT
SUT SILK 2 0 (SUTURE)
SUT SILK 2-0 30XBRD TIE 12 (SUTURE) IMPLANT
SUT VIC AB 3-0 SH 27 (SUTURE) ×2
SUT VIC AB 3-0 SH 27XBRD (SUTURE) ×1 IMPLANT
SYR CONTROL 10ML LL (SYRINGE) ×2 IMPLANT
SYRINGE 10CC LL (SYRINGE) ×2 IMPLANT
TOWEL OR 17X26 10 PK STRL BLUE (TOWEL DISPOSABLE) ×2 IMPLANT

## 2012-05-04 NOTE — Transfer of Care (Signed)
Immediate Anesthesia Transfer of Care Note  Patient: Victoria [name]  Procedure(s) Performed: Procedure(s) (LRB) with comments: INSERTION PORT-A-CATH (Right)  Patient Location: PACU  Anesthesia Type: General  Level of Consciousness: awake, alert , oriented, patient cooperative and responds to stimulation  Airway & Oxygen Therapy: Patient Spontanous Breathing and Patient connected to face mask oxygen  Post-op Assessment: Report given to PACU RN, Post -op Vital signs reviewed and stable and Patient moving all extremities  Post vital signs: Reviewed and stable  Complications: No apparent anesthesia complications

## 2012-05-04 NOTE — H&P (View-Only) (Signed)
Subjective: Some flatus and stool, ct reviewed  Objective: Vital signs in last 24 hours: Temp:  [97.4 F (36.3 C)-98.2 F (36.8 C)] 98.2 F (36.8 C) (09/10 0625) Pulse Rate:  [70-86] 86  (09/10 0625) Resp:  [18] 18  (09/10 0625) BP: (102-134)/(66-92) 116/75 mmHg (09/10 0625) SpO2:  [92 %-96 %] 92 % (09/10 0625) Last BM Date: 04/03/12  Intake/Output from previous day: 09/09 0701 - 09/10 0700 In: 1265 [I.V.:605; TPN:660] Out: 2603 [Urine:2100; Emesis/NG output:500; Stool:3] Intake/Output this shift:    GI: unchanged  Lab Results:   Basename 04/03/12 0505  WBC 14.6*  HGB 11.3*  HCT 33.8*  PLT 335   BMET  Basename 04/03/12 0505 04/02/12 0615  NA 126* 128*  K 4.3 4.4  CL 92* 93*  CO2 26 29  GLUCOSE 105* 110*  BUN 19 17  CREATININE 0.60 0.62  CALCIUM 8.6 9.1   PT/INR No results found for this basename: LABPROT:2,INR:2 in the last 72 hours ABG No results found for this basename: PHART:2,PCO2:2,PO2:2,HCO3:2 in the last 72 hours  Studies/Results: Ct Abdomen Pelvis W Contrast  04/03/2012  *RADIOLOGY REPORT*  Clinical Data: Abdominal distention.  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 Probable surgery tomorrow.  CT ABDOMEN AND PELVIS WITH CONTRAST  Technique:  Multidetector CT imaging of the abdomen and pelvis was performed following the standard protocol during bolus administration of intravenous contrast.  Contrast: 100mL OMNIPAQUE IOHEXOL 300 MG/ML  SOLN  Comparison: Abdomen and pelvis CT dated 03/02/2012 and PET CT dated 03/22/2012.  Findings: Nasogastric tube tip in the region of the distal stomach, somewhat difficult to assess due to patient motion.  Multiple significantly dilated proximal small bowel loops with normal caliber distal loops.  The exact level of the obstruction is difficult to assess due to patient motion degrading the images through the mid and upper abdomen.  However, there is a less pronounced dilated  small bowel loop which tapers gradually to an area of angulation at the location of the previously demonstrated mass in the pelvis on the right.  The main component of pelvic mass on the right measured 8.9 x 7.4 cm in maximum dimensions in the transverse plane on 03/02/2012 and currently measures 9.4 x 6.8 cm in corresponding dimensions on image number 55 of series 2.  A smaller adjacent mass on the right is becoming confluent with the larger mass on the current images.  There is a third mass in the inferior pelvis on the right, invading the rectum with interval central cavitation.  This mass previously measured 6.7 x 5.3 cm in maximum dimensions on 03/02/2012 and currently measures 6.9 x 6.2 cm in corresponding dimensions on image number 71 of series 2.  The pelvic masses appear confluent on sagittal image number 56, measuring 16.8 cm in maximum diameter.  On coronal image number 66, there is perianal extension of tumor measuring 5.3 x 3.7 cm.  No enlarged lymph nodes are identified.  The adrenal glands, liver, spleen, pancreas, gallbladder and right kidney are grossly unremarkable.  The left kidney remains somewhat small.  Interval small bilateral pleural effusions and adjacent bibasilar atelectasis, not included in its entirety.  Edema in the subcutaneous fat laterally on both sides.  The previously seen small amount of free peritoneal fluid is no longer demonstrated.  Multiple colonic diverticula are noted without evidence of diverticulitis.  No enlarged lymph nodes are seen.  Lumbar and lower thoracic spine degenerative changes are noted.  IMPRESSION:    1.  Confluent pelvic masses on the right, measuring 16.8 cm in maximum diameter, invading the rectum and causing small bowel obstruction, as described above. 2.  Limited evaluation of the upper mid abdomen due to patient motion. 3.  Interval small bilateral pleural effusions and bibasilar atelectasis. 4.  Extensive colonic diverticulosis.   Original Report  Authenticated By: STEVEN H. REID, M.D.     Anti-infectives: Anti-infectives    None      Assessment/Plan: malignant sbo  LOS: 11 days  Ct reviewed, I still think operating room is now only choice to try and relieve this.  We discussed again this am and will proceed  Victoria [name] 04/04/2012  

## 2012-05-04 NOTE — Op Note (Signed)
Preoperative diagnosis: Ovarian cancer Postoperative diagnosis: Same as above Procedure: Right subclavian 8 French power port insertion Surgeon: Dr. Harden Mo Anesthesia: Gen. LMA Specimens: None Estimated blood loss: Minimal Drains: None Complications: None Sponge and needle count correct x2 in the operation Disposition to recovery stable  Indications: This is an 78 year old female who I met when she had a small bowel obstruction from an ovarian cancer that was invading into her rectum. I took her to the operating room and did an intestinal bypass of the obstructed area as well as giving her an open gastrostomy and an end colostomy. She is healed well from this and now she is ready to begin chemotherapy. I discussed with her a port placement with the risks and benefits associated with that. We discussed doing this on the right side due to the presence of her pacemaker on the left.  Procedure: After informed consent was obtained the patient was taken to the operating room. She was given 2 g of intravenous cefazolin. She had sequential compression devices placed on her legs. She was placed under general anesthesia without complication. An axillary roll was placed. Her arms were tucked and appropriately padded. She was then prepped and draped in the standard sterile surgical fashion. Surgical timeout was performed.  I did a field block in the right chest as well as her clavicle with 1% lidocaine mixed with quarter percent Marcaine. I then accessed her subclavian vein on the first pass. A wire was placed and this initially went into her internal jugular. Using fluoroscopy I backed this up and adjusted the wire so went down to her vena cava. Once I done this I then made an incision below this and made a pocket overlying the pectoralis fascia below the insertion site. I then tunneled a line between the 2. I then placed the peel-away sheath and the dilator into position using fluoroscopy. I removed  the wire assembly. I then placed a line and removed the peel-away sheath. The line was then connected to the port. This was sutured into 3 positions with 2-0 Prolene suture. I then flushed the line and aspirated blood. This was then packed with heparin. Final fluoroscopy showed the port to be in good position, the line with no kinks,and the tip to be in position in her cava. I then closed this with 3-0 Vicryl, 4 Monocryl, and Dermabond. She tolerated this well was extubated and transferred to recovery stable

## 2012-05-04 NOTE — Anesthesia Preprocedure Evaluation (Addendum)
Anesthesia Evaluation  Patient identified by MRN, date of birth, ID band Patient awake    Reviewed: Allergy & Precautions, H&P , NPO status , Patient's Chart, lab work & pertinent test results  Airway Mallampati: II TM Distance: >3 FB Neck ROM: Full    Dental  (+) Dental Advisory Given, Teeth Intact and Caps   Pulmonary  breath sounds clear to auscultation  Pulmonary exam normal       Cardiovascular hypertension, Pt. on medications and Pt. on home beta blockers + CAD + dysrhythmias Atrial Fibrillation + pacemaker Rhythm:Regular Rate:Normal  Pt 96% A paced, ~5% V paced.    Neuro/Psych Anxiety    GI/Hepatic negative GI ROS, Neg liver ROS,   Endo/Other  negative endocrine ROS  Renal/GU negative Renal ROS     Musculoskeletal negative musculoskeletal ROS (+)   Abdominal   Peds  Hematology negative hematology ROS (+)   Anesthesia Other Findings   Reproductive/Obstetrics                         Anesthesia Physical Anesthesia Plan  ASA: III  Anesthesia Plan: General   Post-op Pain Management:    Induction: Intravenous  Airway Management Planned: LMA  Additional Equipment:   Intra-op Plan:   Post-operative Plan: Extubation in OR  Informed Consent: I have reviewed the patients History and Physical, chart, labs and discussed the procedure including the risks, benefits and alternatives for the proposed anesthesia with the patient or authorized representative who has indicated his/her understanding and acceptance.   Dental advisory given  Plan Discussed with: CRNA  Anesthesia Plan Comments:         Anesthesia Quick Evaluation

## 2012-05-04 NOTE — Progress Notes (Signed)
Patient has G tube which is reddened for about 1.5 cm surrounding exit site. No drainage noted on dressing when son changed dressing this AM. Pt has abdominal incision and inferior portion remains open about "two inches" as per son. Writer called Dr Dwain Sarna to inform him of redness around G tube site. He is also informed of staph surgical PCR being positive.

## 2012-05-04 NOTE — Interval H&P Note (Signed)
History and Physical Interval Note:  05/04/2012 1:56 PM I have discussed port placement with her and risks including but not limited to bleeding, infection, pneumothorax.  Her inr is less than 1.5.  Her g tube site does not appear to have an infection either.  Will proceed today with port placement to begin chemotherapy soon Victoria [name]  has presented today for surgery, with the diagnosis of cancer  The various methods of treatment have been discussed with the patient and family. After consideration of risks, benefits and other options for treatment, the patient has consented to  Procedure(s) (LRB) with comments: INSERTION PORT-A-CATH (N/A) as a surgical intervention .  The patient's history has been reviewed, patient examined, no change in status, stable for surgery.  I have reviewed the patient's chart and labs.  Questions were answered to the patient's satisfaction.     [name]

## 2012-05-04 NOTE — Anesthesia Postprocedure Evaluation (Signed)
  Anesthesia Post-op Note  Patient: Victoria [name]  Procedure(s) Performed: Procedure(s) (LRB): INSERTION PORT-A-CATH (Right)  Patient Location: PACU  Anesthesia Type: General  Level of Consciousness: awake and alert   Airway and Oxygen Therapy: Patient Spontanous Breathing  Post-op Pain: mild  Post-op Assessment: Post-op Vital signs reviewed, Patient's Cardiovascular Status Stable, Respiratory Function Stable, Patent Airway and No signs of Nausea or vomiting  Post-op Vital Signs: stable  Complications: No apparent anesthesia complications

## 2012-05-05 ENCOUNTER — Encounter (INDEPENDENT_AMBULATORY_CARE_PROVIDER_SITE_OTHER): Payer: Medicare Other | Admitting: General Surgery

## 2012-05-05 ENCOUNTER — Encounter (HOSPITAL_COMMUNITY): Payer: Self-pay | Admitting: General Surgery

## 2012-05-08 ENCOUNTER — Ambulatory Visit (HOSPITAL_BASED_OUTPATIENT_CLINIC_OR_DEPARTMENT_OTHER): Payer: Medicare Other | Admitting: Oncology

## 2012-05-08 ENCOUNTER — Other Ambulatory Visit (HOSPITAL_BASED_OUTPATIENT_CLINIC_OR_DEPARTMENT_OTHER): Payer: Medicare Other | Admitting: Lab

## 2012-05-08 ENCOUNTER — Telehealth: Payer: Self-pay | Admitting: Oncology

## 2012-05-08 VITALS — BP 135/74 | HR 70 | Temp 97.0°F | Resp 18 | Ht 61.0 in | Wt 130.1 lb

## 2012-05-08 DIAGNOSIS — C569 Malignant neoplasm of unspecified ovary: Secondary | ICD-10-CM

## 2012-05-08 DIAGNOSIS — I4891 Unspecified atrial fibrillation: Secondary | ICD-10-CM

## 2012-05-08 DIAGNOSIS — Z23 Encounter for immunization: Secondary | ICD-10-CM

## 2012-05-08 DIAGNOSIS — Z7901 Long term (current) use of anticoagulants: Secondary | ICD-10-CM

## 2012-05-08 DIAGNOSIS — C579 Malignant neoplasm of female genital organ, unspecified: Secondary | ICD-10-CM

## 2012-05-08 LAB — CBC WITH DIFFERENTIAL/PLATELET
Basophils Absolute: 0 10*3/uL (ref 0.0–0.1)
EOS%: 1 % (ref 0.0–7.0)
MCH: 29.2 pg (ref 25.1–34.0)
MCV: 88.1 fL (ref 79.5–101.0)
MONO%: 9.4 % (ref 0.0–14.0)
RBC: 3.68 10*6/uL — ABNORMAL LOW (ref 3.70–5.45)
RDW: 16.3 % — ABNORMAL HIGH (ref 11.2–14.5)

## 2012-05-08 LAB — COMPREHENSIVE METABOLIC PANEL (CC13)
AST: 26 U/L (ref 5–34)
Albumin: 3.2 g/dL — ABNORMAL LOW (ref 3.5–5.0)
Alkaline Phosphatase: 101 U/L (ref 40–150)
BUN: 10 mg/dL (ref 7.0–26.0)
Potassium: 3.8 mEq/L (ref 3.5–5.1)
Sodium: 132 mEq/L — ABNORMAL LOW (ref 136–145)

## 2012-05-08 MED ORDER — INFLUENZA VIRUS VACC SPLIT PF IM SUSP
0.5000 mL | Freq: Once | INTRAMUSCULAR | Status: AC
  Administered 2012-05-08: 0.5 mL via INTRAMUSCULAR
  Filled 2012-05-08: qty 0.5

## 2012-05-08 MED ORDER — LIDOCAINE-PRILOCAINE 2.5-2.5 % EX CREA: TOPICAL_CREAM | CUTANEOUS | Status: DC | PRN

## 2012-05-08 MED ORDER — DEXAMETHASONE 4 MG PO TABS: 10.0000 mg | ORAL_TABLET | ORAL | Status: DC

## 2012-05-08 NOTE — Progress Notes (Signed)
    [facility]    OFFICE PROGRESS NOTE   INTERVAL HISTORY:   She returns as scheduled. She feels well. Good appetite and energy level. She underwent placement of a Port-A-Cath to Dr. Dwain Sarna on 05/04/2012.  Ms. Johndrow complains of frequent mucoid drainage from the rectum. She requests a "suppository "for this. She has attended a chemotherapy teaching class.  Objective:  Vital signs in last 24 hours:  Blood pressure 135/74, pulse 70, temperature 97 F (36.1 C), temperature source Oral, resp. rate 18, height 5\' 1"  (1.549 m), weight 130 lb 1.6 oz (59.013 kg).    Resp: Decreased breath sounds at the right compared to the left chest, no respiratory distress Cardio: Regular rate and rhythm GI: No hepatomegaly, nontender, no mass, left lower quadrant colostomy with Brown stool, the midline wound has almost completely healed with a 1 cm opening at the inferior aspect. No surrounding erythema. Left upper quadrant gastrostomy tube site with mild surrounding erythema Vascular: No leg edema  Portacath/PICC-without erythema, resolving ecchymosis  Lab Results:  Lab Results  Component Value Date   WBC 8.2 05/08/2012   HGB 10.8* 05/08/2012   HCT 32.4* 05/08/2012   MCV 88.1 05/08/2012   PLT 319 05/08/2012   ANC 6.6   Medications: I have reviewed the patient's current medications.  Assessment/Plan: 1. Ovarian cancer-presenting with a Rectal mass , status post sigmoidoscopy 03/13/2012 with findings of a 5 cm malignant appearing mass with central ulceration in the rectum 2-3 cm proximal to the anal verge. Pathology showed moderate to poorly differentiated adenocarcinoma with ulceration and prolapse changes. By immunohistochemistry the malignant cells were positive for cytokeratin 7, estrogen receptor and WT-1. They were negative for cytokeratin 20 and CDX-2. This immunohistochemical profile was strongly suggestive of a gynecologic primary. 2. CT abdomen/pelvis on 03/02/2012  with a 6.7 cm mass along the right aspect of the rectum; 2 adjacent nodal masses in the right mid abdomen measuring 8.9 x 7.4 x 7.2 cm and an additional 3.0 x 3.0 x 2.6 cm nodal mass posterior to the cecum. 3. PET scan 03/22/2012 with an intensely hypermetabolic large mass along the right aspect of the rectum; an intensely hypermetabolic right iliac fossa mass; increased caliber of small bowel loops containing air-fluid levels. 4. Hospitalization with nausea/vomiting, abdominal distention due to a small bowel obstruction status post bypass procedure and descending loop colostomy, and gastrostomy tube placement 04/04/2012. 5. Atrial fibrillation maintained on Coumadin. 6. Status post Port-A-Cath placement 05/04/2012 7. Frequent mucoid discharge from the rectum-? Related to the loop colostomy  Disposition:  Her performance status continues to improve. The wound has almost completely healed. The plan is to proceed with Taxol/carboplatin chemotherapy beginning on 05/16/2012. I have reviewed the potential toxicities associated with this chemotherapy regimen and she attended a chemotherapy teaching class. She will be given Decadron premedication prior to the first treatment with Taxol.  I will contact Dr. Dwain Sarna to discuss the rectal drainage which is likely a normal finding with the loop colostomy.  She will return for an office visit on 05/30/2012. We obtained a baseline CA 125 today.   Thornton Papas, MD  05/08/2012  1:21 PM

## 2012-05-08 NOTE — Telephone Encounter (Signed)
gv pt appt schedule for October and November.  °

## 2012-05-08 NOTE — Progress Notes (Signed)
Reviewed how to apply EMLA cream to patient and daughter. Provided written instructions also.

## 2012-05-09 ENCOUNTER — Other Ambulatory Visit (INDEPENDENT_AMBULATORY_CARE_PROVIDER_SITE_OTHER): Payer: Self-pay | Admitting: General Surgery

## 2012-05-09 MED ORDER — HYDROCORTISONE ACETATE 25 MG RE SUPP: 25.0000 mg | Freq: Every day | RECTAL | Status: DC

## 2012-05-14 ENCOUNTER — Other Ambulatory Visit: Payer: Self-pay | Admitting: Oncology

## 2012-05-15 ENCOUNTER — Other Ambulatory Visit (INDEPENDENT_AMBULATORY_CARE_PROVIDER_SITE_OTHER): Payer: Self-pay | Admitting: General Surgery

## 2012-05-15 NOTE — Telephone Encounter (Signed)
Can patient have a refill °

## 2012-05-16 ENCOUNTER — Ambulatory Visit (HOSPITAL_BASED_OUTPATIENT_CLINIC_OR_DEPARTMENT_OTHER): Payer: Medicare Other

## 2012-05-16 VITALS — BP 127/84 | HR 88 | Temp 98.3°F

## 2012-05-16 DIAGNOSIS — Z5111 Encounter for antineoplastic chemotherapy: Secondary | ICD-10-CM

## 2012-05-16 DIAGNOSIS — C569 Malignant neoplasm of unspecified ovary: Secondary | ICD-10-CM

## 2012-05-16 MED ORDER — SODIUM CHLORIDE 0.9 % IV SOLN
400.0000 mg | Freq: Once | INTRAVENOUS | Status: AC
  Administered 2012-05-16: 400 mg via INTRAVENOUS
  Filled 2012-05-16: qty 40

## 2012-05-16 MED ORDER — ONDANSETRON 16 MG/50ML IVPB (CHCC)
16.0000 mg | Freq: Once | INTRAVENOUS | Status: AC
  Administered 2012-05-16: 16 mg via INTRAVENOUS

## 2012-05-16 MED ORDER — SODIUM CHLORIDE 0.9 % IJ SOLN
10.0000 mL | INTRAMUSCULAR | Status: DC | PRN
  Administered 2012-05-16: 10 mL
  Filled 2012-05-16: qty 10

## 2012-05-16 MED ORDER — SODIUM CHLORIDE 0.9 % IV SOLN
Freq: Once | INTRAVENOUS | Status: AC
  Administered 2012-05-16: 12:00:00 via INTRAVENOUS

## 2012-05-16 MED ORDER — DIPHENHYDRAMINE HCL 50 MG/ML IJ SOLN
25.0000 mg | Freq: Once | INTRAMUSCULAR | Status: AC
  Administered 2012-05-16: 25 mg via INTRAVENOUS

## 2012-05-16 MED ORDER — FAMOTIDINE IN NACL 20-0.9 MG/50ML-% IV SOLN
20.0000 mg | Freq: Once | INTRAVENOUS | Status: AC
  Administered 2012-05-16: 20 mg via INTRAVENOUS

## 2012-05-16 MED ORDER — HEPARIN SOD (PORK) LOCK FLUSH 100 UNIT/ML IV SOLN
500.0000 [IU] | Freq: Once | INTRAVENOUS | Status: AC | PRN
  Administered 2012-05-16: 500 [IU]
  Filled 2012-05-16: qty 5

## 2012-05-16 MED ORDER — DEXAMETHASONE SODIUM PHOSPHATE 4 MG/ML IJ SOLN
20.0000 mg | Freq: Once | INTRAMUSCULAR | Status: AC
  Administered 2012-05-16: 20 mg via INTRAVENOUS

## 2012-05-16 MED ORDER — PACLITAXEL CHEMO INJECTION 300 MG/50ML
80.0000 mg/m2 | Freq: Once | INTRAVENOUS | Status: AC
  Administered 2012-05-16: 126 mg via INTRAVENOUS
  Filled 2012-05-16: qty 21

## 2012-05-16 NOTE — Progress Notes (Signed)
Patient tolerated treatment well.  Tischarged to home with son.

## 2012-05-16 NOTE — Patient Instructions (Addendum)
[facility] Discharge Instructions for Patients Receiving Chemotherapy  Today you received the following chemotherapy agents: TAXOL, CARBOPLATIN  To help prevent nausea and vomiting after your treatment, we encourage you to take your nausea medication.   Take it as often as prescribed.     If you develop nausea and vomiting that is not controlled by your nausea medication, call the clinic. If it is after clinic hours your family physician or the after hours number for the clinic or go to the Emergency Department.   BELOW ARE SYMPTOMS THAT SHOULD BE REPORTED IMMEDIATELY:  *FEVER GREATER THAN 100.5 F  *CHILLS WITH OR WITHOUT FEVER  NAUSEA AND VOMITING THAT IS NOT CONTROLLED WITH YOUR NAUSEA MEDICATION  *UNUSUAL SHORTNESS OF BREATH  *UNUSUAL BRUISING OR BLEEDING  TENDERNESS IN MOUTH AND THROAT WITH OR WITHOUT PRESENCE OF ULCERS  *URINARY PROBLEMS  *BOWEL PROBLEMS  UNUSUAL RASH Items with * indicate a potential emergency and should be followed up as soon as possible.  One of the nurses will contact you 24 hours after your treatment. Please let the nurse know about any problems that you may have experienced. Feel free to call the clinic you have any questions or concerns. The clinic phone number is [phone].   I have been informed and understand all the instructions given to me. I know to contact the clinic, my physician, or go to the Emergency Department if any problems should occur. I do not have any questions at this time, but understand that I may call the clinic during office hours   should I have any questions or need assistance in obtaining follow up care.    __________________________________________  _____________  __________ Signature of Patient or Authorized Representative            Date                   Time    __________________________________________ Nurse's Signature  Paclitaxel injection (Taxol) What is this medicine? PACLITAXEL (PAK  li TAX el) is a chemotherapy drug. It targets fast dividing cells, like cancer cells, and causes these cells to die. This medicine is used to treat ovarian cancer, breast cancer, and other cancers. This medicine may be used for other purposes; ask your health care provider or pharmacist if you have questions. What should I tell my health care provider before I take this medicine? They need to know if you have any of these conditions: -blood disorders -irregular heartbeat -infection (especially a virus infection such as chickenpox, cold sores, or herpes) -liver disease -previous or ongoing radiation therapy -an unusual or allergic reaction to paclitaxel, alcohol, polyoxyethylated castor oil, other chemotherapy agents, other medicines, foods, dyes, or preservatives -pregnant or trying to get pregnant -breast-feeding How should I use this medicine? This drug is given as an infusion into a vein. It is administered in a hospital or clinic by a specially trained health care professional. Talk to your pediatrician regarding the use of this medicine in children. Special care may be needed. Overdosage: If you think you have taken too much of this medicine contact a poison control center or emergency room at once. NOTE: This medicine is only for you. Do not share this medicine with others. What if I miss a dose? It is important not to miss your dose. Call your doctor or health care professional if you are unable to keep an appointment. What may interact with this medicine? Do not take this medicine with any of  the following medications: -disulfiram -metronidazole This medicine may also interact with the following medications: -cyclosporine -dexamethasone -diazepam -ketoconazole -medicines to increase blood counts like filgrastim, pegfilgrastim, sargramostim -other chemotherapy drugs like cisplatin, doxorubicin, epirubicin, etoposide, teniposide,  vincristine -quinidine -testosterone -vaccines -verapamil Talk to your doctor or health care professional before taking any of these medicines: -acetaminophen -aspirin -ibuprofen -ketoprofen -naproxen This list may not describe all possible interactions. Give your health care provider a list of all the medicines, herbs, non-prescription drugs, or dietary supplements you use. Also tell them if you smoke, drink alcohol, or use illegal drugs. Some items may interact with your medicine. What should I watch for while using this medicine? Your condition will be monitored carefully while you are receiving this medicine. You will need important blood work done while you are taking this medicine. This drug may make you feel generally unwell. This is not uncommon, as chemotherapy can affect healthy cells as well as cancer cells. Report any side effects. Continue your course of treatment even though you feel ill unless your doctor tells you to stop. In some cases, you may be given additional medicines to help with side effects. Follow all directions for their use. Call your doctor or health care professional for advice if you get a fever, chills or sore throat, or other symptoms of a cold or flu. Do not treat yourself. This drug decreases your body's ability to fight infections. Try to avoid being around people who are sick. This medicine may increase your risk to bruise or bleed. Call your doctor or health care professional if you notice any unusual bleeding. Be careful brushing and flossing your teeth or using a toothpick because you may get an infection or bleed more easily. If you have any dental work done, tell your dentist you are receiving this medicine. Avoid taking products that contain aspirin, acetaminophen, ibuprofen, naproxen, or ketoprofen unless instructed by your doctor. These medicines may hide a fever. Do not become pregnant while taking this medicine. Women should inform their doctor if  they wish to become pregnant or think they might be pregnant. There is a potential for serious side effects to an unborn child. Talk to your health care professional or pharmacist for more information. Do not breast-feed an infant while taking this medicine. Men are advised not to father a child while receiving this medicine. What side effects may I notice from receiving this medicine? Side effects that you should report to your doctor or health care professional as soon as possible: -allergic reactions like skin rash, itching or hives, swelling of the face, lips, or tongue -low blood counts - This drug may decrease the number of white blood cells, red blood cells and platelets. You may be at increased risk for infections and bleeding. -signs of infection - fever or chills, cough, sore throat, pain or difficulty passing urine -signs of decreased platelets or bleeding - bruising, pinpoint red spots on the skin, black, tarry stools, nosebleeds -signs of decreased red blood cells - unusually weak or tired, fainting spells, lightheadedness -breathing problems -chest pain -high or low blood pressure -mouth sores -nausea and vomiting -pain, swelling, redness or irritation at the injection site -pain, tingling, numbness in the hands or feet -slow or irregular heartbeat -swelling of the ankle, feet, hands Side effects that usually do not require medical attention (report to your doctor or health care professional if they continue or are bothersome): -bone pain -complete hair loss including hair on your head, underarms, pubic hair, eyebrows,  and eyelashes -changes in the color of fingernails -diarrhea -loosening of the fingernails -loss of appetite -muscle or joint pain -red flush to skin -sweating This list may not describe all possible side effects. Call your doctor for medical advice about side effects. You may report side effects to FDA at 1-800-FDA-1088. Where should I keep my  medicine? This drug is given in a hospital or clinic and will not be stored at home. NOTE: This sheet is a summary. It may not cover all possible information. If you have questions about this medicine, talk to your doctor, pharmacist, or health care provider.  2012, [employer]. (06/24/2008 11:54:26 AM)     Carboplatin injection What is this medicine? CARBOPLATIN (KAR boe pla tin) is a chemotherapy drug. It targets fast dividing cells, like cancer cells, and causes these cells to die. This medicine is used to treat ovarian cancer and many other cancers. This medicine may be used for other purposes; ask your health care provider or pharmacist if you have questions. What should I tell my health care provider before I take this medicine? They need to know if you have any of these conditions: -blood disorders -hearing problems -kidney disease -recent or ongoing radiation therapy -an unusual or allergic reaction to carboplatin, cisplatin, other chemotherapy, other medicines, foods, dyes, or preservatives -pregnant or trying to get pregnant -breast-feeding How should I use this medicine? This drug is usually given as an infusion into a vein. It is administered in a hospital or clinic by a specially trained health care professional. Talk to your pediatrician regarding the use of this medicine in children. Special care may be needed. Overdosage: If you think you have taken too much of this medicine contact a poison control center or emergency room at once. NOTE: This medicine is only for you. Do not share this medicine with others. What if I miss a dose? It is important not to miss a dose. Call your doctor or health care professional if you are unable to keep an appointment. What may interact with this medicine? -medicines for seizures -medicines to increase blood counts like filgrastim, pegfilgrastim, sargramostim -some antibiotics like amikacin, gentamicin, neomycin,  streptomycin, tobramycin -vaccines Talk to your doctor or health care professional before taking any of these medicines: -acetaminophen -aspirin -ibuprofen -ketoprofen -naproxen This list may not describe all possible interactions. Give your health care provider a list of all the medicines, herbs, non-prescription drugs, or dietary supplements you use. Also tell them if you smoke, drink alcohol, or use illegal drugs. Some items may interact with your medicine. What should I watch for while using this medicine? Your condition will be monitored carefully while you are receiving this medicine. You will need important blood work done while you are taking this medicine. This drug may make you feel generally unwell. This is not uncommon, as chemotherapy can affect healthy cells as well as cancer cells. Report any side effects. Continue your course of treatment even though you feel ill unless your doctor tells you to stop. In some cases, you may be given additional medicines to help with side effects. Follow all directions for their use. Call your doctor or health care professional for advice if you get a fever, chills or sore throat, or other symptoms of a cold or flu. Do not treat yourself. This drug decreases your body's ability to fight infections. Try to avoid being around people who are sick. This medicine may increase your risk to bruise or bleed. Call your doctor or  health care professional if you notice any unusual bleeding. Be careful brushing and flossing your teeth or using a toothpick because you may get an infection or bleed more easily. If you have any dental work done, tell your dentist you are receiving this medicine. Avoid taking products that contain aspirin, acetaminophen, ibuprofen, naproxen, or ketoprofen unless instructed by your doctor. These medicines may hide a fever. Do not become pregnant while taking this medicine. Women should inform their doctor if they wish to become  pregnant or think they might be pregnant. There is a potential for serious side effects to an unborn child. Talk to your health care professional or pharmacist for more information. Do not breast-feed an infant while taking this medicine. What side effects may I notice from receiving this medicine? Side effects that you should report to your doctor or health care professional as soon as possible: -allergic reactions like skin rash, itching or hives, swelling of the face, lips, or tongue -signs of infection - fever or chills, cough, sore throat, pain or difficulty passing urine -signs of decreased platelets or bleeding - bruising, pinpoint red spots on the skin, black, tarry stools, nosebleeds -signs of decreased red blood cells - unusually weak or tired, fainting spells, lightheadedness -breathing problems -changes in hearing -changes in vision -chest pain -high blood pressure -low blood counts - This drug may decrease the number of white blood cells, red blood cells and platelets. You may be at increased risk for infections and bleeding. -nausea and vomiting -pain, swelling, redness or irritation at the injection site -pain, tingling, numbness in the hands or feet -problems with balance, talking, walking -trouble passing urine or change in the amount of urine Side effects that usually do not require medical attention (report to your doctor or health care professional if they continue or are bothersome): -hair loss -loss of appetite -metallic taste in the mouth or changes in taste This list may not describe all possible side effects. Call your doctor for medical advice about side effects. You may report side effects to FDA at 1-800-FDA-1088. Where should I keep my medicine? This drug is given in a hospital or clinic and will not be stored at home. NOTE: This sheet is a summary. It may not cover all possible information. If you have questions about this medicine, talk to your doctor,  pharmacist, or health care provider.  2012, [employer] Standard. (10/17/2007 2:38:05 PM)

## 2012-05-17 ENCOUNTER — Telehealth: Payer: Self-pay | Admitting: *Deleted

## 2012-05-17 NOTE — Telephone Encounter (Signed)
Ms. Kelemen is doing well. Doesn't feel like she had chemotherapy and wants to thank Dr. Truett Perna for selecting the drugs she received.  Reports lots of energy and has been so active.  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  "I'm getting along just fine and I hope the next one works this way too.  Asked that she not overdo her activity as the steroids do wear off and to call if any changes.

## 2012-05-17 NOTE — Telephone Encounter (Signed)
Message copied by Augusto Garbe on Wed May 17, 2012 12:13 PM ------      Message from: Lorri Frederick      Created: Tue May 16, 2012 11:42 AM      Regarding: 1st time chemo      Contact: [phone]       Taxol, Ledell Noss.  Dr Truett Perna

## 2012-05-18 ENCOUNTER — Ambulatory Visit (INDEPENDENT_AMBULATORY_CARE_PROVIDER_SITE_OTHER): Payer: Medicare Other | Admitting: General Surgery

## 2012-05-18 ENCOUNTER — Encounter (INDEPENDENT_AMBULATORY_CARE_PROVIDER_SITE_OTHER): Payer: Self-pay | Admitting: General Surgery

## 2012-05-18 ENCOUNTER — Telehealth (INDEPENDENT_AMBULATORY_CARE_PROVIDER_SITE_OTHER): Payer: Self-pay

## 2012-05-18 VITALS — BP 120/68 | HR 70 | Temp 96.9°F | Resp 16 | Ht 61.0 in | Wt 127.1 lb

## 2012-05-18 DIAGNOSIS — Z431 Encounter for attention to gastrostomy: Secondary | ICD-10-CM

## 2012-05-18 NOTE — Patient Instructions (Addendum)
Change dressing on gastrostomy tube site daily

## 2012-05-18 NOTE — Telephone Encounter (Signed)
Victoria [name]'s grand daughter called stating [name] is having small amount of brownish,green drainage around drain site. 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 No swelling. No fever. [name] was concerned because of drainage color change at skin site. I offered appt for today to see one of our Mds to check area. She states [name] wants only to see Dr Dwain Sarna or Dr Gerrit Friends. I explained they are not in office today. [name] wants to think about this and call back. [name]'s grand daughter called back and [name] does want to be seen today. Appt made for urgent office for drain ck.

## 2012-05-18 NOTE — Progress Notes (Signed)
Subjective:     Patient ID: Victoria [name], female   DOB: 23-Jan-1927, 78 y.o.   MRN: [id number]  HPI Patient comes to urgent office regarding some leakage from her gastrostomy tube. This was placed during surgery last month for small bowel to small bowel bypass and ileostomy. Patient has rectal cancer.  Review of Systems     Objective:   Physical Exam Gastrostomy tube is slightly loose but there is no evidence of infection. Stitches are intact holding it in place. A new dressing was applied after cleaning the site. No complicating features.    Assessment:     Gastrostomy tube without complicating feature    Plan:     Change the dressing at least daily. Some leakage may continue to come out. Hopefully she will be to have tube removed soon. I will have her see Dr. Dwain Sarna next week for further evaluation.

## 2012-05-23 ENCOUNTER — Ambulatory Visit (HOSPITAL_BASED_OUTPATIENT_CLINIC_OR_DEPARTMENT_OTHER): Payer: Medicare Other

## 2012-05-23 ENCOUNTER — Other Ambulatory Visit (HOSPITAL_BASED_OUTPATIENT_CLINIC_OR_DEPARTMENT_OTHER): Payer: Medicare Other | Admitting: Lab

## 2012-05-23 VITALS — BP 109/63 | HR 66 | Temp 98.0°F | Resp 18

## 2012-05-23 DIAGNOSIS — C569 Malignant neoplasm of unspecified ovary: Secondary | ICD-10-CM

## 2012-05-23 DIAGNOSIS — Z5111 Encounter for antineoplastic chemotherapy: Secondary | ICD-10-CM

## 2012-05-23 LAB — CBC WITH DIFFERENTIAL/PLATELET
BASO%: 0.4 % (ref 0.0–2.0)
Eosinophils Absolute: 0.1 10*3/uL (ref 0.0–0.5)
HCT: 30.7 % — ABNORMAL LOW (ref 34.8–46.6)
LYMPH%: 12 % — ABNORMAL LOW (ref 14.0–49.7)
MCHC: 32.9 g/dL (ref 31.5–36.0)
MONO#: 0.4 10*3/uL (ref 0.1–0.9)
NEUT#: 6.7 10*3/uL — ABNORMAL HIGH (ref 1.5–6.5)
NEUT%: 82.1 % — ABNORMAL HIGH (ref 38.4–76.8)
Platelets: 237 10*3/uL (ref 145–400)
WBC: 8.1 10*3/uL (ref 3.9–10.3)
lymph#: 1 10*3/uL (ref 0.9–3.3)
nRBC: 0 % (ref 0–0)

## 2012-05-23 MED ORDER — FAMOTIDINE IN NACL 20-0.9 MG/50ML-% IV SOLN
20.0000 mg | Freq: Once | INTRAVENOUS | Status: AC
  Administered 2012-05-23: 20 mg via INTRAVENOUS

## 2012-05-23 MED ORDER — DEXTROSE 5 % IV SOLN
80.0000 mg/m2 | Freq: Once | INTRAVENOUS | Status: AC
  Administered 2012-05-23: 126 mg via INTRAVENOUS
  Filled 2012-05-23: qty 21

## 2012-05-23 MED ORDER — SODIUM CHLORIDE 0.9 % IJ SOLN
10.0000 mL | INTRAMUSCULAR | Status: DC | PRN
  Administered 2012-05-23: 10 mL
  Filled 2012-05-23: qty 10

## 2012-05-23 MED ORDER — DEXAMETHASONE SODIUM PHOSPHATE 10 MG/ML IJ SOLN
10.0000 mg | Freq: Once | INTRAMUSCULAR | Status: AC
  Administered 2012-05-23: 10 mg via INTRAVENOUS

## 2012-05-23 MED ORDER — ONDANSETRON 8 MG/50ML IVPB (CHCC)
8.0000 mg | Freq: Once | INTRAVENOUS | Status: AC
  Administered 2012-05-23: 8 mg via INTRAVENOUS

## 2012-05-23 MED ORDER — HEPARIN SOD (PORK) LOCK FLUSH 100 UNIT/ML IV SOLN
500.0000 [IU] | Freq: Once | INTRAVENOUS | Status: AC | PRN
  Administered 2012-05-23: 500 [IU]
  Filled 2012-05-23: qty 5

## 2012-05-23 MED ORDER — SODIUM CHLORIDE 0.9 % IV SOLN
Freq: Once | INTRAVENOUS | Status: AC
  Administered 2012-05-23: 14:00:00 via INTRAVENOUS

## 2012-05-23 MED ORDER — DIPHENHYDRAMINE HCL 50 MG/ML IJ SOLN
25.0000 mg | Freq: Once | INTRAMUSCULAR | Status: AC
Start: 2012-05-23 — End: 2012-05-23
  Administered 2012-05-23: 25 mg via INTRAVENOUS

## 2012-05-23 NOTE — Patient Instructions (Addendum)
[facility] Discharge Instructions for Patients Receiving Chemotherapy  Today you received the following chemotherapy agents Taxol.  To help prevent nausea and vomiting after your treatment, we encourage you to take your nausea medication as prescribed.   If you develop nausea and vomiting that is not controlled by your nausea medication, call the clinic. If it is after clinic hours your family physician or the after hours number for the clinic or go to the Emergency Department.   BELOW ARE SYMPTOMS THAT SHOULD BE REPORTED IMMEDIATELY:  *FEVER GREATER THAN 100.5 F  *CHILLS WITH OR WITHOUT FEVER  NAUSEA AND VOMITING THAT IS NOT CONTROLLED WITH YOUR NAUSEA MEDICATION  *UNUSUAL SHORTNESS OF BREATH  *UNUSUAL BRUISING OR BLEEDING  TENDERNESS IN MOUTH AND THROAT WITH OR WITHOUT PRESENCE OF ULCERS  *URINARY PROBLEMS  *BOWEL PROBLEMS  UNUSUAL RASH Items with * indicate a potential emergency and should be followed up as soon as possible.  Feel free to call the clinic you have any questions or concerns. The clinic phone number is [phone].

## 2012-05-26 ENCOUNTER — Encounter (INDEPENDENT_AMBULATORY_CARE_PROVIDER_SITE_OTHER): Payer: Self-pay | Admitting: General Surgery

## 2012-05-26 ENCOUNTER — Ambulatory Visit (INDEPENDENT_AMBULATORY_CARE_PROVIDER_SITE_OTHER): Payer: Medicare Other | Admitting: General Surgery

## 2012-05-26 VITALS — BP 102/64 | HR 88 | Temp 97.6°F | Resp 14 | Ht 61.0 in | Wt 125.2 lb

## 2012-05-26 DIAGNOSIS — Z431 Encounter for attention to gastrostomy: Secondary | ICD-10-CM

## 2012-05-26 MED ORDER — HYDROCORTISONE ACETATE 25 MG RE SUPP: RECTAL | Status: DC

## 2012-05-26 NOTE — Progress Notes (Signed)
Subjective:     Patient ID: Victoria [name], female   DOB: 1926/08/06, 78 y.o.   MRN: [id number]  HPI 12 yof who has ovarian cancer undergoing chemotherapy who I did diverting colostomy, small intestinal bypass and g tube for obstruction.  She is doing well except for some leakage at g tube site and she wants this removed.  She also has some rectal drainage for which I gave her some anusol suppositories and was better.  Review of Systems     Objective:   Physical Exam    wound nearly healed without infection, colostomy functional and pink g tube without infection Assessment:     Ovarian cancer    Plan:     I can remove g tube.  Will touch base with Dr. Truett Perna first.  I will take out based on chemo so this will hopefully heal. I gave her rx for anusol.  I think this is related to tumor burden but was better with them.

## 2012-05-28 ENCOUNTER — Other Ambulatory Visit: Payer: Self-pay | Admitting: Oncology

## 2012-05-30 ENCOUNTER — Telehealth: Payer: Self-pay | Admitting: Oncology

## 2012-05-30 ENCOUNTER — Other Ambulatory Visit (HOSPITAL_BASED_OUTPATIENT_CLINIC_OR_DEPARTMENT_OTHER): Payer: Medicare Other | Admitting: Lab

## 2012-05-30 ENCOUNTER — Ambulatory Visit (HOSPITAL_BASED_OUTPATIENT_CLINIC_OR_DEPARTMENT_OTHER): Payer: Medicare Other

## 2012-05-30 ENCOUNTER — Ambulatory Visit (HOSPITAL_BASED_OUTPATIENT_CLINIC_OR_DEPARTMENT_OTHER): Payer: Medicare Other | Admitting: Nurse Practitioner

## 2012-05-30 VITALS — BP 114/59 | HR 68 | Temp 97.0°F | Resp 20 | Ht 61.0 in | Wt 128.2 lb

## 2012-05-30 DIAGNOSIS — Z23 Encounter for immunization: Secondary | ICD-10-CM

## 2012-05-30 DIAGNOSIS — Z5111 Encounter for antineoplastic chemotherapy: Secondary | ICD-10-CM

## 2012-05-30 DIAGNOSIS — C569 Malignant neoplasm of unspecified ovary: Secondary | ICD-10-CM

## 2012-05-30 DIAGNOSIS — I4891 Unspecified atrial fibrillation: Secondary | ICD-10-CM

## 2012-05-30 LAB — CBC WITH DIFFERENTIAL/PLATELET
BASO%: 0.3 % (ref 0.0–2.0)
HCT: 29.6 % — ABNORMAL LOW (ref 34.8–46.6)
MCHC: 32.8 g/dL (ref 31.5–36.0)
MONO#: 0.2 10*3/uL (ref 0.1–0.9)
NEUT%: 66.3 % (ref 38.4–76.8)
RDW: 15.6 % — ABNORMAL HIGH (ref 11.2–14.5)
WBC: 3 10*3/uL — ABNORMAL LOW (ref 3.9–10.3)
lymph#: 0.8 10*3/uL — ABNORMAL LOW (ref 0.9–3.3)
nRBC: 0 % (ref 0–0)

## 2012-05-30 MED ORDER — HEPARIN SOD (PORK) LOCK FLUSH 100 UNIT/ML IV SOLN
500.0000 [IU] | Freq: Once | INTRAVENOUS | Status: AC | PRN
  Administered 2012-05-30: 500 [IU]
  Filled 2012-05-30: qty 5

## 2012-05-30 MED ORDER — DIPHENHYDRAMINE HCL 50 MG/ML IJ SOLN
25.0000 mg | Freq: Once | INTRAMUSCULAR | Status: AC
  Administered 2012-05-30: 25 mg via INTRAVENOUS

## 2012-05-30 MED ORDER — PACLITAXEL CHEMO INJECTION 300 MG/50ML
80.0000 mg/m2 | Freq: Once | INTRAVENOUS | Status: AC
  Administered 2012-05-30: 126 mg via INTRAVENOUS
  Filled 2012-05-30: qty 21

## 2012-05-30 MED ORDER — SODIUM CHLORIDE 0.9 % IJ SOLN
10.0000 mL | INTRAMUSCULAR | Status: DC | PRN
  Administered 2012-05-30: 10 mL
  Filled 2012-05-30: qty 10

## 2012-05-30 MED ORDER — DEXAMETHASONE SODIUM PHOSPHATE 10 MG/ML IJ SOLN
10.0000 mg | Freq: Once | INTRAMUSCULAR | Status: AC
  Administered 2012-05-30: 10 mg via INTRAVENOUS

## 2012-05-30 MED ORDER — SODIUM CHLORIDE 0.9 % IV SOLN
Freq: Once | INTRAVENOUS | Status: AC
  Administered 2012-05-30: 12:00:00 via INTRAVENOUS

## 2012-05-30 MED ORDER — FAMOTIDINE IN NACL 20-0.9 MG/50ML-% IV SOLN
20.0000 mg | Freq: Once | INTRAVENOUS | Status: AC
  Administered 2012-05-30: 20 mg via INTRAVENOUS

## 2012-05-30 MED ORDER — PNEUMOCOCCAL VAC POLYVALENT 25 MCG/0.5ML IJ INJ
0.5000 mL | INJECTION | Freq: Once | INTRAMUSCULAR | Status: AC
  Administered 2012-05-30: 0.5 mL via INTRAMUSCULAR
  Filled 2012-05-30: qty 0.5

## 2012-05-30 MED ORDER — ONDANSETRON 8 MG/50ML IVPB (CHCC)
8.0000 mg | Freq: Once | INTRAVENOUS | Status: AC
  Administered 2012-05-30: 8 mg via INTRAVENOUS

## 2012-05-30 NOTE — Patient Instructions (Addendum)
[facility] Discharge Instructions for Patients Receiving Chemotherapy  Today you received the following chemotherapy agents: taxol.  To help prevent nausea and vomiting after your treatment, we encourage you to take your nausea medication.  Take it as often as prescribed.   If you develop nausea and vomiting that is not controlled by your nausea medication, call the clinic. If it is after clinic hours your family physician or the after hours number for the clinic or go to the Emergency Department.   BELOW ARE SYMPTOMS THAT SHOULD BE REPORTED IMMEDIATELY:  *FEVER GREATER THAN 100.5 F  *CHILLS WITH OR WITHOUT FEVER  NAUSEA AND VOMITING THAT IS NOT CONTROLLED WITH YOUR NAUSEA MEDICATION  *UNUSUAL SHORTNESS OF BREATH  *UNUSUAL BRUISING OR BLEEDING  TENDERNESS IN MOUTH AND THROAT WITH OR WITHOUT PRESENCE OF ULCERS  *URINARY PROBLEMS  *BOWEL PROBLEMS  UNUSUAL RASH Items with * indicate a potential emergency and should be followed up as soon as possible.  Feel free to call the clinic you have any questions or concerns. The clinic phone number is [phone].   I have been informed and understand all the instructions given to me. I know to contact the clinic, my physician, or go to the Emergency Department if any problems should occur. I do not have any questions at this time, but understand that I may call the clinic during office hours   should I have any questions or need assistance in obtaining follow up care.    __________________________________________  _____________  __________ Signature of Patient or Authorized Representative            Date                   Time    __________________________________________ Victoria [name]   \[id number]\

## 2012-05-30 NOTE — Progress Notes (Signed)
OFFICE PROGRESS NOTE  Interval history:  Victoria [name] returns as scheduled. She was treated with carboplatin/Taxol on 05/16/2012 and Taxol on 05/23/2012. She tolerated treatment well. She denies nausea/vomiting. No mouth sores. No diarrhea. Colostomy functioning normally. She has occasional transient numbness in the fingertips which she relates to arthritis. She denies abdominal pain. No leg swelling. No shortness of breath.   Objective: Blood pressure 114/59, pulse 68, temperature 97 F (36.1 C), temperature source Oral, resp. rate 20, height 5\' 1"  (1.549 m), weight 128 lb 3.2 oz (58.151 kg).  Oropharynx is without thrush or ulceration. Lungs are clear. Breath sounds diminished at the right lower lung field. Regular cardiac rhythm. Port-A-Cath site is without erythema. Abdomen is soft and nontender. No hepatomegaly. Left lower quadrant colostomy. G-tube with mild surrounding erythema. Midline wound almost completely healed with a superficial approximate half centimeter opening at the inferior aspect. No erythema. Extremities without edema. Calves are soft and nontender.  Lab Results: Lab Results  Component Value Date   WBC 3.0* 05/30/2012   HGB 9.7* 05/30/2012   HCT 29.6* 05/30/2012   MCV 85.5 05/30/2012   PLT 184 05/30/2012    Chemistry:    Chemistry      Component Value Date/Time   NA 132* 05/08/2012 1143   NA 131* 05/03/2012 1340   K 3.8 05/08/2012 1143   K 4.2 05/03/2012 1340   CL 100 05/08/2012 1143   CL 95* 05/03/2012 1340   CO2 23 05/08/2012 1143   CO2 27 05/03/2012 1340   BUN 10.0 05/08/2012 1143   BUN 12 05/03/2012 1340   CREATININE 0.7 05/08/2012 1143   CREATININE 0.74 05/03/2012 1340      Component Value Date/Time   CALCIUM 9.3 05/08/2012 1143   CALCIUM 9.0 05/03/2012 1340   ALKPHOS 101 05/08/2012 1143   ALKPHOS 182* 04/13/2012 0423   AST 26 05/08/2012 1143   AST 22 04/13/2012 0423   ALT 10 05/08/2012 1143   ALT 16 04/13/2012 0423   BILITOT 0.30 05/08/2012 1143   BILITOT  0.2* 04/13/2012 0423       Studies/Results: Dg Chest Port 1 View  05/04/2012  *RADIOLOGY REPORT*  Clinical Data: Port-A-Cath placement.  PORTABLE CHEST - 1 VIEW  Comparison: 03/27/2012.  Findings: 1518 hours.  The arm PICC has been removed.  There is a new right subclavian power port with its tip at the upper SVC level. There is no pneumothorax.  Heart size and mediastinal contours are stable.  There is stable volume loss in the right hemithorax.  The overall pulmonary aeration has improved compared with the prior study.  Left subclavian pacemaker leads appear unchanged.  The nasogastric tube has also been removed.  IMPRESSION:  1.  Right subclavian power port tip at the upper SVC level. 2.  No pneumothorax.  Improved pulmonary aeration.   Original Report Authenticated By: Gerrianne Scale, M.D.    Dg C-arm 1-60 Min-no Report  05/04/2012  CLINICAL DATA: Port-a-cath   C-ARM 1-60 MINUTES  Fluoroscopy was utilized by the requesting physician.  No radiographic  interpretation.      Medications: I have reviewed the patient's current medications.  Assessment/Plan:  1. Ovarian cancer-presenting with a Rectal mass , status post sigmoidoscopy 03/13/2012 with findings of a 5 cm malignant appearing mass with central ulceration in the rectum 2-3 cm proximal to the anal verge. Pathology showed moderate to poorly differentiated adenocarcinoma with ulceration and prolapse changes. By immunohistochemistry the malignant cells were positive for cytokeratin 7, estrogen receptor  and WT-1. They were negative for cytokeratin 20 and CDX-2. This immunohistochemical profile was strongly suggestive of a gynecologic primary. 2. CT abdomen/pelvis on 03/02/2012 with a 6.7 cm mass along the right aspect of the rectum; 2 adjacent nodal masses in the right mid abdomen measuring 8.9 x 7.4 x 7.2 cm and an additional 3.0 x 3.0 x 2.6 cm nodal mass posterior to the cecum. 3. PET scan 03/22/2012 with an intensely hypermetabolic  large mass along the right aspect of the rectum; an intensely hypermetabolic right iliac fossa mass; increased caliber of small bowel loops containing air-fluid levels. 4. Hospitalization with nausea/vomiting, abdominal distention due to a small bowel obstruction status post bypass procedure and descending loop colostomy, and gastrostomy tube placement 04/04/2012. 5. Atrial fibrillation maintained on Coumadin. Followed by cardiology. 6. Status post Port-A-Cath placement 05/04/2012. 7. Frequent mucoid discharge from the rectum-question related to the loop colostomy.  Disposition-Victoria [name] appears stable. She is tolerating the chemotherapy well. Plan to proceed with cycle 1 day 15 today as scheduled. She will return to begin cycle 2 Taxol/carboplatin 06/13/2012. We will see her in followup prior to the day 15 treatment. She will contact the office in the interim with any problems. She received the influenza vaccine on 05/08/2012. We are administering the pneumococcal vaccine today.  Plan reviewed with Dr. Truett Perna.  Lonna Cobb ANP/GNP-BC

## 2012-05-30 NOTE — Telephone Encounter (Signed)
gv pt appt schedule for November and December.  °

## 2012-06-02 ENCOUNTER — Encounter (INDEPENDENT_AMBULATORY_CARE_PROVIDER_SITE_OTHER): Payer: Self-pay | Admitting: General Surgery

## 2012-06-02 ENCOUNTER — Ambulatory Visit (INDEPENDENT_AMBULATORY_CARE_PROVIDER_SITE_OTHER): Payer: Medicare Other | Admitting: General Surgery

## 2012-06-02 VITALS — BP 120/68 | HR 64 | Temp 96.7°F | Resp 16 | Ht 61.0 in | Wt 129.8 lb

## 2012-06-02 DIAGNOSIS — Z09 Encounter for follow-up examination after completed treatment for conditions other than malignant neoplasm: Secondary | ICD-10-CM

## 2012-06-02 NOTE — Progress Notes (Signed)
Subjective:     Patient ID: Victoria [name], female   DOB: 12-18-1926, 78 y.o.   MRN: [id number]  HPI This is an 78 year old female with ovarian cancer who I did an intestinal bypass, colostomy, and gastrostomy previously. She is doing well her chemotherapy. She has not needed her gastrostomy at all. I discussed this with her medical oncologist. She returns today to remove the gastrostomy tube as she does not have any more chemotherapy for about 10 days.  Review of Systems     Objective:   Physical Exam Colostomy is functional, and gastrostomy tube in place    Assessment:     Ovarian cancer with gastrostomy    Plan:     I removed her gastrostomy tube without difficulty. I told her this would leak and it needs to be covered but hopefully will close in the next several days. I did tell her because she does on chemotherapy there is a small chance that this would not close and we will have to replace her tube.

## 2012-06-11 ENCOUNTER — Other Ambulatory Visit: Payer: Self-pay | Admitting: Oncology

## 2012-06-13 ENCOUNTER — Ambulatory Visit (HOSPITAL_BASED_OUTPATIENT_CLINIC_OR_DEPARTMENT_OTHER): Payer: Medicare Other

## 2012-06-13 ENCOUNTER — Other Ambulatory Visit (HOSPITAL_BASED_OUTPATIENT_CLINIC_OR_DEPARTMENT_OTHER): Payer: Medicare Other | Admitting: Lab

## 2012-06-13 VITALS — BP 114/67 | HR 44 | Temp 97.6°F | Resp 18

## 2012-06-13 DIAGNOSIS — Z5111 Encounter for antineoplastic chemotherapy: Secondary | ICD-10-CM

## 2012-06-13 DIAGNOSIS — C569 Malignant neoplasm of unspecified ovary: Secondary | ICD-10-CM

## 2012-06-13 LAB — CBC WITH DIFFERENTIAL/PLATELET
Eosinophils Absolute: 0 10*3/uL (ref 0.0–0.5)
LYMPH%: 22.6 % (ref 14.0–49.7)
MCH: 28.6 pg (ref 25.1–34.0)
MCHC: 32.6 g/dL (ref 31.5–36.0)
MCV: 87.6 fL (ref 79.5–101.0)
MONO%: 13.2 % (ref 0.0–14.0)
NEUT#: 3.1 10*3/uL (ref 1.5–6.5)
Platelets: 322 10*3/uL (ref 145–400)
RBC: 3.78 10*6/uL (ref 3.70–5.45)
nRBC: 0 % (ref 0–0)

## 2012-06-13 MED ORDER — SODIUM CHLORIDE 0.9 % IJ SOLN
10.0000 mL | INTRAMUSCULAR | Status: DC | PRN
  Administered 2012-06-13: 10 mL
  Filled 2012-06-13: qty 10

## 2012-06-13 MED ORDER — FAMOTIDINE IN NACL 20-0.9 MG/50ML-% IV SOLN
20.0000 mg | Freq: Once | INTRAVENOUS | Status: AC
  Administered 2012-06-13: 20 mg via INTRAVENOUS

## 2012-06-13 MED ORDER — SODIUM CHLORIDE 0.9 % IV SOLN
400.0000 mg | Freq: Once | INTRAVENOUS | Status: AC
  Administered 2012-06-13: 400 mg via INTRAVENOUS
  Filled 2012-06-13: qty 40

## 2012-06-13 MED ORDER — HEPARIN SOD (PORK) LOCK FLUSH 100 UNIT/ML IV SOLN
500.0000 [IU] | Freq: Once | INTRAVENOUS | Status: AC | PRN
Start: 2012-06-13 — End: 2012-06-13
  Administered 2012-06-13: 500 [IU]
  Filled 2012-06-13: qty 5

## 2012-06-13 MED ORDER — SODIUM CHLORIDE 0.9 % IV SOLN
Freq: Once | INTRAVENOUS | Status: AC
  Administered 2012-06-13: 12:00:00 via INTRAVENOUS

## 2012-06-13 MED ORDER — ONDANSETRON 16 MG/50ML IVPB (CHCC)
16.0000 mg | Freq: Once | INTRAVENOUS | Status: AC
  Administered 2012-06-13: 16 mg via INTRAVENOUS

## 2012-06-13 MED ORDER — PACLITAXEL CHEMO INJECTION 300 MG/50ML
80.0000 mg/m2 | Freq: Once | INTRAVENOUS | Status: AC
  Administered 2012-06-13: 126 mg via INTRAVENOUS
  Filled 2012-06-13: qty 21

## 2012-06-13 MED ORDER — DEXAMETHASONE SODIUM PHOSPHATE 4 MG/ML IJ SOLN
20.0000 mg | Freq: Once | INTRAMUSCULAR | Status: AC
  Administered 2012-06-13: 20 mg via INTRAVENOUS

## 2012-06-13 MED ORDER — DIPHENHYDRAMINE HCL 50 MG/ML IJ SOLN
25.0000 mg | Freq: Once | INTRAMUSCULAR | Status: AC
  Administered 2012-06-13: 25 mg via INTRAVENOUS

## 2012-06-13 NOTE — Patient Instructions (Addendum)
[facility] Discharge Instructions for Patients Receiving Chemotherapy  Today you received the following chemotherapy agents Taxol/Carboplatin To help prevent nausea and vomiting after your treatment, we encourage you to take your nausea medication as prescribed.  If you develop nausea and vomiting that is not controlled by your nausea medication, call the clinic. If it is after clinic hours your family physician or the after hours number for the clinic or go to the Emergency Department.   BELOW ARE SYMPTOMS THAT SHOULD BE REPORTED IMMEDIATELY:  *FEVER GREATER THAN 100.5 F  *CHILLS WITH OR WITHOUT FEVER  NAUSEA AND VOMITING THAT IS NOT CONTROLLED WITH YOUR NAUSEA MEDICATION  *UNUSUAL SHORTNESS OF BREATH  *UNUSUAL BRUISING OR BLEEDING  TENDERNESS IN MOUTH AND THROAT WITH OR WITHOUT PRESENCE OF ULCERS  *URINARY PROBLEMS  *BOWEL PROBLEMS  UNUSUAL RASH Items with * indicate a potential emergency and should be followed up as soon as possible.  One of the nurses will contact you 24 hours after your treatment. Please let the nurse know about any problems that you may have experienced. Feel free to call the clinic you have any questions or concerns. The clinic phone number is [phone].   I have been informed and understand all the instructions given to me. I know to contact the clinic, my physician, or go to the Emergency Department if any problems should occur. I do not have any questions at this time, but understand that I may call the clinic during office hours   should I have any questions or need assistance in obtaining follow up care.    __________________________________________  _____________  __________ Signature of Patient or Authorized Representative            Date                   Time    __________________________________________ Nurse's Signature    

## 2012-06-20 ENCOUNTER — Ambulatory Visit (HOSPITAL_BASED_OUTPATIENT_CLINIC_OR_DEPARTMENT_OTHER): Payer: Medicare Other

## 2012-06-20 ENCOUNTER — Other Ambulatory Visit (HOSPITAL_BASED_OUTPATIENT_CLINIC_OR_DEPARTMENT_OTHER): Payer: Medicare Other | Admitting: Lab

## 2012-06-20 VITALS — BP 145/83 | HR 101 | Temp 97.7°F

## 2012-06-20 DIAGNOSIS — Z5111 Encounter for antineoplastic chemotherapy: Secondary | ICD-10-CM

## 2012-06-20 DIAGNOSIS — C569 Malignant neoplasm of unspecified ovary: Secondary | ICD-10-CM

## 2012-06-20 LAB — CBC WITH DIFFERENTIAL/PLATELET
Basophils Absolute: 0 10*3/uL (ref 0.0–0.1)
EOS%: 0.5 % (ref 0.0–7.0)
Eosinophils Absolute: 0 10*3/uL (ref 0.0–0.5)
LYMPH%: 18.6 % (ref 14.0–49.7)
MCH: 28.7 pg (ref 25.1–34.0)
MCV: 87 fL (ref 79.5–101.0)
MONO%: 4.7 % (ref 0.0–14.0)
Platelets: 226 10*3/uL (ref 145–400)
RBC: 3.62 10*6/uL — ABNORMAL LOW (ref 3.70–5.45)
WBC: 4 10*3/uL (ref 3.9–10.3)
nRBC: 0 % (ref 0–0)

## 2012-06-20 MED ORDER — FAMOTIDINE IN NACL 20-0.9 MG/50ML-% IV SOLN
20.0000 mg | Freq: Once | INTRAVENOUS | Status: AC
  Administered 2012-06-20: 20 mg via INTRAVENOUS

## 2012-06-20 MED ORDER — ONDANSETRON 8 MG/50ML IVPB (CHCC)
8.0000 mg | Freq: Once | INTRAVENOUS | Status: AC
  Administered 2012-06-20: 8 mg via INTRAVENOUS

## 2012-06-20 MED ORDER — SODIUM CHLORIDE 0.9 % IV SOLN
Freq: Once | INTRAVENOUS | Status: AC
  Administered 2012-06-20: 12:00:00 via INTRAVENOUS

## 2012-06-20 MED ORDER — DEXAMETHASONE SODIUM PHOSPHATE 4 MG/ML IJ SOLN
20.0000 mg | Freq: Once | INTRAMUSCULAR | Status: AC
  Administered 2012-06-20: 20 mg via INTRAVENOUS

## 2012-06-20 MED ORDER — HEPARIN SOD (PORK) LOCK FLUSH 100 UNIT/ML IV SOLN
500.0000 [IU] | Freq: Once | INTRAVENOUS | Status: AC | PRN
  Administered 2012-06-20: 500 [IU]
  Filled 2012-06-20: qty 5

## 2012-06-20 MED ORDER — DIPHENHYDRAMINE HCL 50 MG/ML IJ SOLN
25.0000 mg | Freq: Once | INTRAMUSCULAR | Status: AC
  Administered 2012-06-20: 25 mg via INTRAVENOUS

## 2012-06-20 MED ORDER — PACLITAXEL CHEMO INJECTION 300 MG/50ML
80.0000 mg/m2 | Freq: Once | INTRAVENOUS | Status: AC
  Administered 2012-06-20: 126 mg via INTRAVENOUS
  Filled 2012-06-20: qty 21

## 2012-06-20 MED ORDER — SODIUM CHLORIDE 0.9 % IJ SOLN
10.0000 mL | INTRAMUSCULAR | Status: DC | PRN
  Administered 2012-06-20: 10 mL
  Filled 2012-06-20: qty 10

## 2012-06-20 NOTE — Patient Instructions (Signed)
[facility] Discharge Instructions for Patients Receiving Chemotherapy  Today you received the following chemotherapy agents Taxol.  To help prevent nausea and vomiting after your treatment, we encourage you to take your nausea medication as prescribed.  If you develop nausea and vomiting that is not controlled by your nausea medication, call the clinic. If it is after clinic hours your family physician or the after hours number for the clinic or go to the Emergency Department.   BELOW ARE SYMPTOMS THAT SHOULD BE REPORTED IMMEDIATELY:  *FEVER GREATER THAN 100.5 F  *CHILLS WITH OR WITHOUT FEVER  NAUSEA AND VOMITING THAT IS NOT CONTROLLED WITH YOUR NAUSEA MEDICATION  *UNUSUAL SHORTNESS OF BREATH  *UNUSUAL BRUISING OR BLEEDING  TENDERNESS IN MOUTH AND THROAT WITH OR WITHOUT PRESENCE OF ULCERS  *URINARY PROBLEMS  *BOWEL PROBLEMS  UNUSUAL RASH Items with * indicate a potential emergency and should be followed up as soon as possible.  One of the nurses will contact you 24 hours after your treatment. Please let the nurse know about any problems that you may have experienced. Feel free to call the clinic you have any questions or concerns. The clinic phone number is [phone].     

## 2012-06-24 ENCOUNTER — Other Ambulatory Visit: Payer: Self-pay | Admitting: Oncology

## 2012-06-27 ENCOUNTER — Ambulatory Visit (HOSPITAL_BASED_OUTPATIENT_CLINIC_OR_DEPARTMENT_OTHER): Payer: Medicare Other

## 2012-06-27 ENCOUNTER — Ambulatory Visit (HOSPITAL_BASED_OUTPATIENT_CLINIC_OR_DEPARTMENT_OTHER): Payer: Medicare Other | Admitting: Oncology

## 2012-06-27 ENCOUNTER — Telehealth: Payer: Self-pay | Admitting: *Deleted

## 2012-06-27 ENCOUNTER — Other Ambulatory Visit (HOSPITAL_BASED_OUTPATIENT_CLINIC_OR_DEPARTMENT_OTHER): Payer: Medicare Other | Admitting: Lab

## 2012-06-27 ENCOUNTER — Ambulatory Visit (HOSPITAL_BASED_OUTPATIENT_CLINIC_OR_DEPARTMENT_OTHER): Payer: Medicare Other | Admitting: Lab

## 2012-06-27 ENCOUNTER — Telehealth: Payer: Self-pay | Admitting: Oncology

## 2012-06-27 VITALS — BP 141/76 | HR 77 | Temp 97.0°F | Resp 20 | Ht 61.0 in | Wt 132.8 lb

## 2012-06-27 DIAGNOSIS — I4891 Unspecified atrial fibrillation: Secondary | ICD-10-CM

## 2012-06-27 DIAGNOSIS — Z5181 Encounter for therapeutic drug level monitoring: Secondary | ICD-10-CM

## 2012-06-27 DIAGNOSIS — C569 Malignant neoplasm of unspecified ovary: Secondary | ICD-10-CM

## 2012-06-27 DIAGNOSIS — D702 Other drug-induced agranulocytosis: Secondary | ICD-10-CM

## 2012-06-27 DIAGNOSIS — R609 Edema, unspecified: Secondary | ICD-10-CM

## 2012-06-27 DIAGNOSIS — Z5111 Encounter for antineoplastic chemotherapy: Secondary | ICD-10-CM

## 2012-06-27 DIAGNOSIS — Z7901 Long term (current) use of anticoagulants: Secondary | ICD-10-CM

## 2012-06-27 LAB — CBC WITH DIFFERENTIAL/PLATELET
BASO%: 1 % (ref 0.0–2.0)
Basophils Absolute: 0 10*3/uL (ref 0.0–0.1)
HCT: 28.6 % — ABNORMAL LOW (ref 34.8–46.6)
HGB: 9.4 g/dL — ABNORMAL LOW (ref 11.6–15.9)
MONO#: 0.1 10*3/uL (ref 0.1–0.9)
NEUT#: 1.1 10*3/uL — ABNORMAL LOW (ref 1.5–6.5)
NEUT%: 53.7 % (ref 38.4–76.8)
WBC: 2 10*3/uL — ABNORMAL LOW (ref 3.9–10.3)
lymph#: 0.8 10*3/uL — ABNORMAL LOW (ref 0.9–3.3)

## 2012-06-27 LAB — COMPREHENSIVE METABOLIC PANEL (CC13)
ALT: 20 U/L (ref 0–55)
AST: 18 U/L (ref 5–34)
Alkaline Phosphatase: 86 U/L (ref 40–150)
CO2: 23 mEq/L (ref 22–29)
Creatinine: 0.7 mg/dL (ref 0.6–1.1)
Sodium: 136 mEq/L (ref 136–145)
Total Bilirubin: 0.4 mg/dL (ref 0.20–1.20)

## 2012-06-27 LAB — PROTIME-INR: Protime: 20.4 Seconds — ABNORMAL HIGH (ref 10.6–13.4)

## 2012-06-27 LAB — CA 125: CA 125: 39.3 U/mL — ABNORMAL HIGH (ref 0.0–30.2)

## 2012-06-27 MED ORDER — DEXAMETHASONE SODIUM PHOSPHATE 10 MG/ML IJ SOLN
10.0000 mg | Freq: Once | INTRAMUSCULAR | Status: AC
  Administered 2012-06-27: 10 mg via INTRAVENOUS

## 2012-06-27 MED ORDER — HEPARIN SOD (PORK) LOCK FLUSH 100 UNIT/ML IV SOLN
500.0000 [IU] | Freq: Once | INTRAVENOUS | Status: DC | PRN
  Filled 2012-06-27: qty 5

## 2012-06-27 MED ORDER — ONDANSETRON 8 MG/50ML IVPB (CHCC)
8.0000 mg | Freq: Once | INTRAVENOUS | Status: AC
  Administered 2012-06-27: 8 mg via INTRAVENOUS

## 2012-06-27 MED ORDER — DIPHENHYDRAMINE HCL 50 MG/ML IJ SOLN
25.0000 mg | Freq: Once | INTRAMUSCULAR | Status: AC
  Administered 2012-06-27: 25 mg via INTRAVENOUS

## 2012-06-27 MED ORDER — PACLITAXEL CHEMO INJECTION 300 MG/50ML
80.0000 mg/m2 | Freq: Once | INTRAVENOUS | Status: AC
  Administered 2012-06-27: 126 mg via INTRAVENOUS
  Filled 2012-06-27: qty 21

## 2012-06-27 MED ORDER — FAMOTIDINE IN NACL 20-0.9 MG/50ML-% IV SOLN
20.0000 mg | Freq: Once | INTRAVENOUS | Status: AC
  Administered 2012-06-27: 20 mg via INTRAVENOUS

## 2012-06-27 MED ORDER — SODIUM CHLORIDE 0.9 % IJ SOLN
10.0000 mL | INTRAMUSCULAR | Status: DC | PRN
  Filled 2012-06-27: qty 10

## 2012-06-27 MED ORDER — SODIUM CHLORIDE 0.9 % IV SOLN
Freq: Once | INTRAVENOUS | Status: AC
  Administered 2012-06-27: 14:00:00 via INTRAVENOUS

## 2012-06-27 NOTE — Patient Instructions (Signed)
[facility] Discharge Instructions for Patients Receiving Chemotherapy  Today you received the following chemotherapy agents Taxol  To help prevent nausea and vomiting after your treatment, we encourage you to take your nausea medication as prescribed.  If you develop nausea and vomiting that is not controlled by your nausea medication, call the clinic. If it is after clinic hours your family physician or the after hours number for the clinic or go to the Emergency Department.   BELOW ARE SYMPTOMS THAT SHOULD BE REPORTED IMMEDIATELY:  *FEVER GREATER THAN 100.5 F  *CHILLS WITH OR WITHOUT FEVER  NAUSEA AND VOMITING THAT IS NOT CONTROLLED WITH YOUR NAUSEA MEDICATION  *UNUSUAL SHORTNESS OF BREATH  *UNUSUAL BRUISING OR BLEEDING  TENDERNESS IN MOUTH AND THROAT WITH OR WITHOUT PRESENCE OF ULCERS  *URINARY PROBLEMS  *BOWEL PROBLEMS  UNUSUAL RASH Items with * indicate a potential emergency and should be followed up as soon as possible.  One of the nurses will contact you 24 hours after your treatment. Please let the nurse know about any problems that you may have experienced. Feel free to call the clinic you have any questions or concerns. The clinic phone number is [phone].   I have been informed and understand all the instructions given to me. I know to contact the clinic, my physician, or go to the Emergency Department if any problems should occur. I do not have any questions at this time, but understand that I may call the clinic during office hours   should I have any questions or need assistance in obtaining follow up care.    __________________________________________  _____________  __________ Signature of Patient or Authorized Representative            Date                   Time    __________________________________________ Nurse's Signature    

## 2012-06-27 NOTE — Progress Notes (Signed)
Ok to treat with ANC 1.1 per Dr. Truett Perna

## 2012-06-27 NOTE — Telephone Encounter (Signed)
Per staff message and POF I have scheduled appts.  JMW  

## 2012-06-27 NOTE — Telephone Encounter (Signed)
appts made and printed for pt aom °

## 2012-06-27 NOTE — Progress Notes (Signed)
   [facility]    OFFICE PROGRESS NOTE   INTERVAL HISTORY:   She returns as scheduled. She began cycle 2 of chemotherapy on 06/13/2012. She reports tolerating the chemotherapy well. No nausea, mouth sores, or neuropathy symptoms. No pain. The feeding tube was removed and the site has healed.  She has noted mild swelling at the right leg and foot for the past 2 days. No pain.  Objective:  Vital signs in last 24 hours:  Blood pressure 141/76, pulse 77, temperature 97 F (36.1 C), temperature source Oral, resp. rate 20, height 5\' 1"  (1.549 m), weight 132 lb 12.8 oz (60.238 kg).    HEENT: No thrush or ulcers Resp: Lungs clear bilaterally Cardio: Regular rate and rhythm GI: No hepatomegaly, nontender, left lower quadrant ostomy, healed feeding tube site Vascular: Trace pitting edema at the right greater than left lower leg and ankle. No erythema or tenderness  Portacath/PICC-without erythema  Lab Results:  Lab Results  Component Value Date   WBC 2.0* 06/27/2012   HGB 9.4* 06/27/2012   HCT 28.6* 06/27/2012   MCV 86.7 06/27/2012   PLT 122* 06/27/2012   ANC 1.1    Medications: I have reviewed the patient's current medications.  Assessment/Plan: 1. Ovarian cancer-presenting with a Rectal mass , status post sigmoidoscopy 03/13/2012 with findings of a 5 cm malignant appearing mass with central ulceration in the rectum 2-3 cm proximal to the anal verge. Pathology showed moderate to poorly differentiated adenocarcinoma with ulceration and prolapse changes. By immunohistochemistry the malignant cells were positive for cytokeratin 7, estrogen receptor and WT-1. They were negative for cytokeratin 20 and CDX-2. This immunohistochemical profile was strongly suggestive of a gynecologic primary. 2. CT abdomen/pelvis on 03/02/2012 with a 6.7 cm mass along the right aspect of the rectum; 2 adjacent nodal masses in the right mid abdomen measuring 8.9 x 7.4 x 7.2 cm and an additional  3.0 x 3.0 x 2.6 cm nodal mass posterior to the cecum. 3. PET scan 03/22/2012 with an intensely hypermetabolic large mass along the right aspect of the rectum; an intensely hypermetabolic right iliac fossa mass; increased caliber of small bowel loops containing air-fluid levels. 4. Hospitalization with nausea/vomiting, abdominal distention due to a small bowel obstruction status post bypass procedure and descending loop colostomy, and gastrostomy tube placement 04/04/2012. The gastrostomy tube has been removed. 5. Atrial fibrillation maintained on Coumadin. She reports the PT/INR has not been checked in several weeks. We will check this today. 6. Status post Port-A-Cath placement 05/04/2012. 7. Mild edema at the right lower leg and ankle-I have a low clinical suspicion for a deep vein thrombosis. She will elevate the legs and contact us if this does not improve. 8. Mild neutropenia secondary to chemotherapy-and contact us for a fever   Disposition:  Ms. Nowakowski appears well. She will complete the second cycle of Taxol/carboplatin today. We will followup on the CA 125 today. She will begin cycle 3 of chemotherapy on 07/11/2012. She is scheduled for an office visit on 07/18/2012. Ms. Kleinsasser will be referred for a restaging CT evaluation after cycle 3.  Approximate 25 minutes were spent with patient today. The majority of time was spent in counseling and coordination of care.  Thornton Papas, MD  06/27/2012  1:04 PM

## 2012-06-28 ENCOUNTER — Telehealth: Payer: Self-pay | Admitting: *Deleted

## 2012-06-28 DIAGNOSIS — Z5181 Encounter for therapeutic drug level monitoring: Secondary | ICD-10-CM

## 2012-06-28 DIAGNOSIS — C569 Malignant neoplasm of unspecified ovary: Secondary | ICD-10-CM

## 2012-06-28 NOTE — Telephone Encounter (Signed)
Called and spoke with pt; per Dr. Truett Perna instructed pt to same on same coumadin.  Pt verbalized understanding.

## 2012-06-28 NOTE — Telephone Encounter (Signed)
Message copied by Gala Romney on Wed Jun 28, 2012  3:40 PM ------      Message from: Ladene Artist      Created: Tue Jun 27, 2012  8:06 PM       Please call patient, same coumadin, add PT nex visit

## 2012-07-04 ENCOUNTER — Other Ambulatory Visit (HOSPITAL_BASED_OUTPATIENT_CLINIC_OR_DEPARTMENT_OTHER): Payer: Medicare Other

## 2012-07-04 DIAGNOSIS — Z7901 Long term (current) use of anticoagulants: Secondary | ICD-10-CM

## 2012-07-04 DIAGNOSIS — Z5181 Encounter for therapeutic drug level monitoring: Secondary | ICD-10-CM

## 2012-07-04 DIAGNOSIS — C569 Malignant neoplasm of unspecified ovary: Secondary | ICD-10-CM

## 2012-07-04 LAB — CBC WITH DIFFERENTIAL/PLATELET
BASO%: 0.4 % (ref 0.0–2.0)
EOS%: 0.4 % (ref 0.0–7.0)
HCT: 30.3 % — ABNORMAL LOW (ref 34.8–46.6)
LYMPH%: 25.8 % (ref 14.0–49.7)
MCH: 28.9 pg (ref 25.1–34.0)
MCHC: 33.3 g/dL (ref 31.5–36.0)
MONO%: 3.4 % (ref 0.0–14.0)
NEUT%: 70 % (ref 38.4–76.8)
lymph#: 0.6 10*3/uL — ABNORMAL LOW (ref 0.9–3.3)

## 2012-07-04 LAB — PROTIME-INR: INR: 1.4 — ABNORMAL LOW (ref 2.00–3.50)

## 2012-07-07 ENCOUNTER — Telehealth: Payer: Self-pay | Admitting: *Deleted

## 2012-07-07 NOTE — Telephone Encounter (Signed)
Spoke with pt, she called her Coumadin Clinic for instructions due to low INR. [facility] is managing this. Pt was told they can log on to the system and access her lab results.

## 2012-07-09 ENCOUNTER — Other Ambulatory Visit: Payer: Self-pay | Admitting: Oncology

## 2012-07-11 ENCOUNTER — Other Ambulatory Visit (HOSPITAL_BASED_OUTPATIENT_CLINIC_OR_DEPARTMENT_OTHER): Payer: Medicare Other | Admitting: Lab

## 2012-07-11 ENCOUNTER — Ambulatory Visit: Payer: Medicare Other

## 2012-07-11 DIAGNOSIS — C569 Malignant neoplasm of unspecified ovary: Secondary | ICD-10-CM

## 2012-07-11 DIAGNOSIS — Z5181 Encounter for therapeutic drug level monitoring: Secondary | ICD-10-CM

## 2012-07-11 LAB — CBC WITH DIFFERENTIAL/PLATELET
Basophils Absolute: 0 10*3/uL (ref 0.0–0.1)
EOS%: 0.7 % (ref 0.0–7.0)
HCT: 31.1 % — ABNORMAL LOW (ref 34.8–46.6)
HGB: 10.4 g/dL — ABNORMAL LOW (ref 11.6–15.9)
MCH: 30.1 pg (ref 25.1–34.0)
MCV: 90.1 fL (ref 79.5–101.0)
MONO%: 27.7 % — ABNORMAL HIGH (ref 0.0–14.0)
NEUT%: 36.4 % — ABNORMAL LOW (ref 38.4–76.8)
Platelets: 348 10*3/uL (ref 145–400)

## 2012-07-11 NOTE — Progress Notes (Signed)
ANC 0.7 today.   Dr. Truett Perna notified.  Order received to hold chemo today.  Pt already has appts for 07/18/12.   Explanations given to pt and daughter while in the lobby;  And both voiced understanding.

## 2012-07-18 ENCOUNTER — Ambulatory Visit (HOSPITAL_BASED_OUTPATIENT_CLINIC_OR_DEPARTMENT_OTHER): Payer: Medicare Other

## 2012-07-18 ENCOUNTER — Ambulatory Visit (HOSPITAL_BASED_OUTPATIENT_CLINIC_OR_DEPARTMENT_OTHER): Payer: Medicare Other | Admitting: Oncology

## 2012-07-18 ENCOUNTER — Other Ambulatory Visit (HOSPITAL_BASED_OUTPATIENT_CLINIC_OR_DEPARTMENT_OTHER): Payer: Medicare Other | Admitting: Lab

## 2012-07-18 ENCOUNTER — Telehealth: Payer: Self-pay | Admitting: *Deleted

## 2012-07-18 VITALS — BP 155/91 | HR 61 | Temp 96.9°F | Resp 18 | Ht 61.0 in | Wt 131.6 lb

## 2012-07-18 DIAGNOSIS — K6289 Other specified diseases of anus and rectum: Secondary | ICD-10-CM

## 2012-07-18 DIAGNOSIS — I4891 Unspecified atrial fibrillation: Secondary | ICD-10-CM

## 2012-07-18 DIAGNOSIS — C569 Malignant neoplasm of unspecified ovary: Secondary | ICD-10-CM

## 2012-07-18 DIAGNOSIS — Z5111 Encounter for antineoplastic chemotherapy: Secondary | ICD-10-CM

## 2012-07-18 DIAGNOSIS — Z7901 Long term (current) use of anticoagulants: Secondary | ICD-10-CM

## 2012-07-18 DIAGNOSIS — D702 Other drug-induced agranulocytosis: Secondary | ICD-10-CM

## 2012-07-18 DIAGNOSIS — Z5181 Encounter for therapeutic drug level monitoring: Secondary | ICD-10-CM

## 2012-07-18 LAB — CA 125: CA 125: 11.3 U/mL (ref 0.0–30.2)

## 2012-07-18 LAB — CBC WITH DIFFERENTIAL/PLATELET
Basophils Absolute: 0 10*3/uL (ref 0.0–0.1)
EOS%: 0.2 % (ref 0.0–7.0)
LYMPH%: 23.9 % (ref 14.0–49.7)
MCH: 29.2 pg (ref 25.1–34.0)
MCV: 90.4 fL (ref 79.5–101.0)
MONO%: 8.7 % (ref 0.0–14.0)
Platelets: 316 10*3/uL (ref 145–400)
RBC: 3.53 10*6/uL — ABNORMAL LOW (ref 3.70–5.45)
RDW: 21.8 % — ABNORMAL HIGH (ref 11.2–14.5)
nRBC: 0 % (ref 0–0)

## 2012-07-18 MED ORDER — SODIUM CHLORIDE 0.9 % IV SOLN
Freq: Once | INTRAVENOUS | Status: AC
  Administered 2012-07-18: 10:00:00 via INTRAVENOUS

## 2012-07-18 MED ORDER — DIPHENHYDRAMINE HCL 50 MG/ML IJ SOLN
25.0000 mg | Freq: Once | INTRAMUSCULAR | Status: AC
  Administered 2012-07-18: 25 mg via INTRAVENOUS

## 2012-07-18 MED ORDER — DEXAMETHASONE SODIUM PHOSPHATE 10 MG/ML IJ SOLN
10.0000 mg | Freq: Once | INTRAMUSCULAR | Status: AC
  Administered 2012-07-18: 10 mg via INTRAVENOUS

## 2012-07-18 MED ORDER — SODIUM CHLORIDE 0.9 % IV SOLN
400.0000 mg | Freq: Once | INTRAVENOUS | Status: AC
  Administered 2012-07-18: 400 mg via INTRAVENOUS
  Filled 2012-07-18: qty 40

## 2012-07-18 MED ORDER — PACLITAXEL CHEMO INJECTION 300 MG/50ML
65.0000 mg/m2 | Freq: Once | INTRAVENOUS | Status: AC
  Administered 2012-07-18: 102 mg via INTRAVENOUS
  Filled 2012-07-18: qty 17

## 2012-07-18 MED ORDER — FAMOTIDINE IN NACL 20-0.9 MG/50ML-% IV SOLN
20.0000 mg | Freq: Once | INTRAVENOUS | Status: AC
  Administered 2012-07-18: 20 mg via INTRAVENOUS

## 2012-07-18 MED ORDER — ONDANSETRON 16 MG/50ML IVPB (CHCC)
16.0000 mg | Freq: Once | INTRAVENOUS | Status: AC
  Administered 2012-07-18: 16 mg via INTRAVENOUS

## 2012-07-18 MED ORDER — HEPARIN SOD (PORK) LOCK FLUSH 100 UNIT/ML IV SOLN
500.0000 [IU] | Freq: Once | INTRAVENOUS | Status: AC | PRN
  Administered 2012-07-18: 500 [IU]
  Filled 2012-07-18: qty 5

## 2012-07-18 MED ORDER — SODIUM CHLORIDE 0.9 % IJ SOLN
10.0000 mL | INTRAMUSCULAR | Status: DC | PRN
  Administered 2012-07-18: 10 mL
  Filled 2012-07-18: qty 10

## 2012-07-18 NOTE — Progress Notes (Signed)
   [name]    OFFICE PROGRESS NOTE   INTERVAL HISTORY:   She returns as scheduled. She feels well. She completed cycle 2 of Taxol/carboplatin beginning on 06/13/2012. Chemotherapy was held last week secondary to neutropenia. She denies nausea, abdominal pain, and neuropathy symptoms.  Objective:  Vital signs in last 24 hours:  Blood pressure 155/91, pulse 61, temperature 96.9 F (36.1 C), temperature source Oral, resp. rate 18, height 5\' 1"  (1.549 m), weight 131 lb 9.6 oz (59.693 kg).    HEENT: No thrush or ulcers Resp: Lungs clear bilaterally with decreased breath sounds at the right lower chest, no respiratory distress Cardio: Irregular GI: Nontender, no hepatomegaly, no mass, left abdominal colostomy Vascular: Trace pitting edema at the right greater than left ankle and lower leg, no erythema    Portacath/PICC-without erythema  Lab Results:  Lab Results  Component Value Date   WBC 4.6 07/18/2012   HGB 10.3* 07/18/2012   HCT 31.9* 07/18/2012   MCV 90.4 07/18/2012   PLT 316 07/18/2012   ANC 3.1 PT/INR 2.3   CA 125 on 06/27/2012-39.3  Medications: I have reviewed the patient's current medications.  Assessment/Plan: 1. Ovarian cancer-presenting with a Rectal mass , status post sigmoidoscopy 03/13/2012 with findings of a 5 cm malignant appearing mass with central ulceration in the rectum 2-3 cm proximal to the anal verge. Pathology showed moderate to poorly differentiated adenocarcinoma with ulceration and prolapse changes. By immunohistochemistry the malignant cells were positive for cytokeratin 7, estrogen receptor and WT-1. They were negative for cytokeratin 20 and CDX-2. This immunohistochemical profile was strongly suggestive of a gynecologic primary. 2. CT abdomen/pelvis on 03/02/2012 with a 6.7 cm mass along the right aspect of the rectum; 2 adjacent nodal masses in the right mid abdomen measuring 8.9 x 7.4 x 7.2 cm and an additional 3.0 x 3.0 x  2.6 cm nodal mass posterior to the cecum. 3. PET scan 03/22/2012 with an intensely hypermetabolic large mass along the right aspect of the rectum; an intensely hypermetabolic right iliac fossa mass; increased caliber of small bowel loops containing air-fluid levels. 4. Hospitalization with nausea/vomiting, abdominal distention due to a small bowel obstruction status post bypass procedure and descending loop colostomy, and gastrostomy tube placement 04/04/2012. The gastrostomy tube has been removed. 5. Atrial fibrillation maintained on Coumadin. The PT/INR is therapeutic. 6. Status post Port-A-Cath placement 05/04/2012. 7. Mild edema at the right lower leg and ankles-I have a low clinical suspicion for a deep vein thrombosis. 8. History of neutropenia secondary to chemotherapy causing a treatment delay following cycle 2. We will dose reduce the Taxol beginning with cycle 3.  Disposition:  She has completed 2 cycles of Taxol/carboplatin chemotherapy. [name] has tolerated the chemotherapy well. The CA 125 is much improved. She will complete cycle 3 beginning today. [name] will be scheduled for a restaging CT after cycle 3. We will refer her back to GYN oncology after cycle 3.   Thornton Papas, MD  07/18/2012  9:42 AM

## 2012-07-18 NOTE — Patient Instructions (Addendum)
[facility] Discharge Instructions for Patients Receiving Chemotherapy  Today you received the following chemotherapy agents: taxol, carboplatin  To help prevent nausea and vomiting after your treatment, we encourage you to take your nausea medication.  Take it as often as prescribed.     If you develop nausea and vomiting that is not controlled by your nausea medication, call the clinic. If it is after clinic hours your family physician or the after hours number for the clinic or go to the Emergency Department.   BELOW ARE SYMPTOMS THAT SHOULD BE REPORTED IMMEDIATELY:  *FEVER GREATER THAN 100.5 F  *CHILLS WITH OR WITHOUT FEVER  NAUSEA AND VOMITING THAT IS NOT CONTROLLED WITH YOUR NAUSEA MEDICATION  *UNUSUAL SHORTNESS OF BREATH  *UNUSUAL BRUISING OR BLEEDING  TENDERNESS IN MOUTH AND THROAT WITH OR WITHOUT PRESENCE OF ULCERS  *URINARY PROBLEMS  *BOWEL PROBLEMS  UNUSUAL RASH Items with * indicate a potential emergency and should be followed up as soon as possible.  Feel free to call the clinic you have any questions or concerns. The clinic phone number is [phone].   I have been informed and understand all the instructions given to me. I know to contact the clinic, my physician, or go to the Emergency Department if any problems should occur. I do not have any questions at this time, but understand that I may call the clinic during office hours   should I have any questions or need assistance in obtaining follow up care.    __________________________________________  _____________  __________ Signature of Patient or Authorized Representative            Date                   Time    __________________________________________ Nurse's Signature    

## 2012-07-18 NOTE — Telephone Encounter (Signed)
Per staff voicemail and POF I have scheduled appt. Victoria [name]

## 2012-07-21 ENCOUNTER — Telehealth: Payer: Self-pay | Admitting: Oncology

## 2012-07-21 ENCOUNTER — Telehealth: Payer: Self-pay | Admitting: *Deleted

## 2012-07-21 NOTE — Telephone Encounter (Signed)
Per staff message and POF I have scheduled apapts. JMW  

## 2012-07-21 NOTE — Telephone Encounter (Signed)
called pt with 12/31 appts/confirm and advised her to get a new sch at that time     anne

## 2012-07-24 ENCOUNTER — Other Ambulatory Visit: Payer: Self-pay | Admitting: Certified Registered Nurse Anesthetist

## 2012-07-25 ENCOUNTER — Ambulatory Visit (HOSPITAL_BASED_OUTPATIENT_CLINIC_OR_DEPARTMENT_OTHER): Payer: Medicare Other

## 2012-07-25 ENCOUNTER — Other Ambulatory Visit (HOSPITAL_BASED_OUTPATIENT_CLINIC_OR_DEPARTMENT_OTHER): Payer: Medicare Other | Admitting: Lab

## 2012-07-25 VITALS — BP 112/75 | HR 59 | Temp 97.7°F

## 2012-07-25 DIAGNOSIS — C569 Malignant neoplasm of unspecified ovary: Secondary | ICD-10-CM

## 2012-07-25 DIAGNOSIS — Z5111 Encounter for antineoplastic chemotherapy: Secondary | ICD-10-CM

## 2012-07-25 DIAGNOSIS — Z7901 Long term (current) use of anticoagulants: Secondary | ICD-10-CM

## 2012-07-25 LAB — CBC WITH DIFFERENTIAL/PLATELET
BASO%: 0.9 % (ref 0.0–2.0)
EOS%: 0.3 % (ref 0.0–7.0)
HCT: 29.8 % — ABNORMAL LOW (ref 34.8–46.6)
MCH: 29.3 pg (ref 25.1–34.0)
MCHC: 32.6 g/dL (ref 31.5–36.0)
MONO#: 0.1 10*3/uL (ref 0.1–0.9)
RBC: 3.31 10*6/uL — ABNORMAL LOW (ref 3.70–5.45)
RDW: 20.7 % — ABNORMAL HIGH (ref 11.2–14.5)
WBC: 3.2 10*3/uL — ABNORMAL LOW (ref 3.9–10.3)
lymph#: 0.8 10*3/uL — ABNORMAL LOW (ref 0.9–3.3)
nRBC: 0 % (ref 0–0)

## 2012-07-25 MED ORDER — SODIUM CHLORIDE 0.9 % IJ SOLN
10.0000 mL | INTRAMUSCULAR | Status: DC | PRN
  Administered 2012-07-25: 10 mL
  Filled 2012-07-25: qty 10

## 2012-07-25 MED ORDER — SODIUM CHLORIDE 0.9 % IV SOLN
Freq: Once | INTRAVENOUS | Status: AC
  Administered 2012-07-25: 10:00:00 via INTRAVENOUS

## 2012-07-25 MED ORDER — ONDANSETRON 8 MG/50ML IVPB (CHCC)
8.0000 mg | Freq: Once | INTRAVENOUS | Status: AC
  Administered 2012-07-25: 8 mg via INTRAVENOUS

## 2012-07-25 MED ORDER — DEXAMETHASONE SODIUM PHOSPHATE 10 MG/ML IJ SOLN
10.0000 mg | Freq: Once | INTRAMUSCULAR | Status: AC
  Administered 2012-07-25: 10 mg via INTRAVENOUS

## 2012-07-25 MED ORDER — FAMOTIDINE IN NACL 20-0.9 MG/50ML-% IV SOLN
20.0000 mg | Freq: Once | INTRAVENOUS | Status: AC
  Administered 2012-07-25: 20 mg via INTRAVENOUS

## 2012-07-25 MED ORDER — PACLITAXEL CHEMO INJECTION 300 MG/50ML
65.0000 mg/m2 | Freq: Once | INTRAVENOUS | Status: AC
  Administered 2012-07-25: 102 mg via INTRAVENOUS
  Filled 2012-07-25: qty 17

## 2012-07-25 MED ORDER — HEPARIN SOD (PORK) LOCK FLUSH 100 UNIT/ML IV SOLN
500.0000 [IU] | Freq: Once | INTRAVENOUS | Status: AC | PRN
  Administered 2012-07-25: 500 [IU]
  Filled 2012-07-25: qty 5

## 2012-07-25 MED ORDER — DIPHENHYDRAMINE HCL 50 MG/ML IJ SOLN
25.0000 mg | Freq: Once | INTRAMUSCULAR | Status: AC
  Administered 2012-07-25: 25 mg via INTRAVENOUS

## 2012-07-25 NOTE — Patient Instructions (Addendum)
[facility] Discharge Instructions for Patients Receiving Chemotherapy  Today you received the following chemotherapy agents: taxol  To help prevent nausea and vomiting after your treatment, we encourage you to take your nausea medication.  Take it as often as prescribed.     If you develop nausea and vomiting that is not controlled by your nausea medication, call the clinic. If it is after clinic hours your family physician or the after hours number for the clinic or go to the Emergency Department.   BELOW ARE SYMPTOMS THAT SHOULD BE REPORTED IMMEDIATELY:  *FEVER GREATER THAN 100.5 F  *CHILLS WITH OR WITHOUT FEVER  NAUSEA AND VOMITING THAT IS NOT CONTROLLED WITH YOUR NAUSEA MEDICATION  *UNUSUAL SHORTNESS OF BREATH  *UNUSUAL BRUISING OR BLEEDING  TENDERNESS IN MOUTH AND THROAT WITH OR WITHOUT PRESENCE OF ULCERS  *URINARY PROBLEMS  *BOWEL PROBLEMS  UNUSUAL RASH Items with * indicate a potential emergency and should be followed up as soon as possible.  Feel free to call the clinic you have any questions or concerns. The clinic phone number is [phone].   I have been informed and understand all the instructions given to me. I know to contact the clinic, my physician, or go to the Emergency Department if any problems should occur. I do not have any questions at this time, but understand that I may call the clinic during office hours   should I have any questions or need assistance in obtaining follow up care.    __________________________________________  _____________  __________ Signature of Patient or Authorized Representative            Date                   Time    __________________________________________ Nurse's Signature    

## 2012-07-30 ENCOUNTER — Telehealth (INDEPENDENT_AMBULATORY_CARE_PROVIDER_SITE_OTHER): Payer: Self-pay | Admitting: General Surgery

## 2012-07-30 NOTE — Telephone Encounter (Signed)
Pt describes small amount of ostomy prolapse with some irritation.  She emptied around 10 ml of blood x3 over the course of 24 hours.  She described the ostomy as looking like a penis.  She is on coumadin and is on active chemotherapy.  Platelet count was 173k 12/31.  INR not in our system after 12/24 and was 2.3.  This is for atrial fibrillation.  She described the ostomy as pink/red with no purple or dark areas.   I advised her that she would need to come to ED if the bleeding increased or if the ostomy turned dusky.   Her daughter who was a nurse was on the way to see her. I told her we could evaluate today it in the ED if the pt/daughter wanted.

## 2012-07-31 ENCOUNTER — Encounter (INDEPENDENT_AMBULATORY_CARE_PROVIDER_SITE_OTHER): Payer: Self-pay | Admitting: General Surgery

## 2012-07-31 ENCOUNTER — Ambulatory Visit (INDEPENDENT_AMBULATORY_CARE_PROVIDER_SITE_OTHER): Payer: Medicare Other | Admitting: General Surgery

## 2012-07-31 VITALS — BP 128/64 | HR 79 | Temp 97.8°F | Resp 18 | Ht 60.0 in | Wt 132.0 lb

## 2012-07-31 DIAGNOSIS — IMO0002 Reserved for concepts with insufficient information to code with codable children: Secondary | ICD-10-CM

## 2012-07-31 DIAGNOSIS — K9409 Other complications of colostomy: Secondary | ICD-10-CM

## 2012-07-31 NOTE — Progress Notes (Signed)
Subjective:     Patient ID: Victoria [name], female   DOB: 08/14/1926, 79 y.o.   MRN: [id number]  HPI This is an 79 year old female who presents after noticing her colostomy prolapse. She is undergoing chemotherapy for a gynecologic cancer right now. Previously I had diverted her due to the mass growing into her rectum and also bypassed her intestines for a small bowel obstruction. She is otherwise doing well. On Friday and Saturday she noticed that this started to get larger he noticed a little bit of bleeding. It is still functional. She called over the weekend and I had her come in today.  Review of Systems     Objective:   Physical Exam Prolapse sigmoid colostomy, this is the proximal portion. This is all viable and I was able to easily reduce this in the office today    Assessment:     Prolapsed colostomy    Plan:     I think there is a chance this may happen again. I did tell her that. We discussed some strategies to try to avoid this. She is due to get some staging CT scans on Monday and then we'll plan on discussing any further treatment after that.

## 2012-08-01 ENCOUNTER — Other Ambulatory Visit (HOSPITAL_BASED_OUTPATIENT_CLINIC_OR_DEPARTMENT_OTHER): Payer: Medicare Other | Admitting: Lab

## 2012-08-01 ENCOUNTER — Ambulatory Visit: Payer: Medicare Other

## 2012-08-01 DIAGNOSIS — Z7901 Long term (current) use of anticoagulants: Secondary | ICD-10-CM

## 2012-08-01 DIAGNOSIS — C569 Malignant neoplasm of unspecified ovary: Secondary | ICD-10-CM

## 2012-08-01 LAB — CBC WITH DIFFERENTIAL/PLATELET
BASO%: 0.6 % (ref 0.0–2.0)
Basophils Absolute: 0 10*3/uL (ref 0.0–0.1)
Eosinophils Absolute: 0 10*3/uL (ref 0.0–0.5)
HCT: 29 % — ABNORMAL LOW (ref 34.8–46.6)
HGB: 9.4 g/dL — ABNORMAL LOW (ref 11.6–15.9)
LYMPH%: 41.4 % (ref 14.0–49.7)
MCHC: 32.4 g/dL (ref 31.5–36.0)
MONO#: 0.3 10*3/uL (ref 0.1–0.9)
NEUT#: 0.6 10*3/uL — ABNORMAL LOW (ref 1.5–6.5)
NEUT%: 37.9 % — ABNORMAL LOW (ref 38.4–76.8)
Platelets: 109 10*3/uL — ABNORMAL LOW (ref 145–400)
WBC: 1.7 10*3/uL — ABNORMAL LOW (ref 3.9–10.3)
lymph#: 0.7 10*3/uL — ABNORMAL LOW (ref 0.9–3.3)

## 2012-08-01 LAB — PROTIME-INR: INR: 1.3 — ABNORMAL LOW (ref 2.00–3.50)

## 2012-08-01 NOTE — Progress Notes (Signed)
Chemo held today due to labs. Pt denies any fever. To call if has fever or any problems.

## 2012-08-03 ENCOUNTER — Telehealth: Payer: Self-pay | Admitting: Oncology

## 2012-08-03 ENCOUNTER — Telehealth: Payer: Self-pay | Admitting: *Deleted

## 2012-08-03 DIAGNOSIS — C569 Malignant neoplasm of unspecified ovary: Secondary | ICD-10-CM

## 2012-08-03 NOTE — Telephone Encounter (Signed)
Pt's INR being managed by pharmacist with Dr. Royann Shivers. INR sent to his inbasket. Pt is scheduled for scan 1/13. States her stoma is prolapsed. She has seen Dr. Dwain Sarna and may need to have it surgically repaired, MD will decide after seeing scan.  Per Dr. Truett Perna, will talk to pt in treatment area to give scan results on 08/08/12.

## 2012-08-03 NOTE — Telephone Encounter (Signed)
Talked to patient gave her lab appt before chemo on 08/08/12

## 2012-08-03 NOTE — Telephone Encounter (Signed)
Message from pt asking if she should be scheduled for lab prior to next treatment. Yes, POF to schedulers to add lab before treatment.

## 2012-08-03 NOTE — Telephone Encounter (Signed)
Message copied by Caleb Popp on Thu Aug 03, 2012  2:35 PM ------      Message from: Thornton Papas B      Created: Tue Aug 01, 2012  9:15 PM       ?are we managing the coumadin      If not please forward INR to managing MD      If yes, then repeat PT in 1 week

## 2012-08-06 ENCOUNTER — Other Ambulatory Visit: Payer: Self-pay | Admitting: Oncology

## 2012-08-07 ENCOUNTER — Encounter (HOSPITAL_COMMUNITY): Payer: Self-pay

## 2012-08-07 ENCOUNTER — Ambulatory Visit (HOSPITAL_COMMUNITY)
Admission: RE | Admit: 2012-08-07 | Discharge: 2012-08-07 | Disposition: A | Discharge status: Home / Self Care | Payer: Medicare Other | Source: Ambulatory Visit | Attending: Oncology | Admitting: Oncology

## 2012-08-07 DIAGNOSIS — K5730 Diverticulosis of large intestine without perforation or abscess without bleeding: Secondary | ICD-10-CM | POA: Insufficient documentation

## 2012-08-07 DIAGNOSIS — C785 Secondary malignant neoplasm of large intestine and rectum: Secondary | ICD-10-CM | POA: Insufficient documentation

## 2012-08-07 DIAGNOSIS — Z950 Presence of cardiac pacemaker: Secondary | ICD-10-CM | POA: Insufficient documentation

## 2012-08-07 DIAGNOSIS — C569 Malignant neoplasm of unspecified ovary: Secondary | ICD-10-CM | POA: Insufficient documentation

## 2012-08-07 DIAGNOSIS — Z79899 Other long term (current) drug therapy: Secondary | ICD-10-CM | POA: Insufficient documentation

## 2012-08-07 DIAGNOSIS — I517 Cardiomegaly: Secondary | ICD-10-CM | POA: Insufficient documentation

## 2012-08-07 DIAGNOSIS — Z7901 Long term (current) use of anticoagulants: Secondary | ICD-10-CM

## 2012-08-07 DIAGNOSIS — Z933 Colostomy status: Secondary | ICD-10-CM | POA: Insufficient documentation

## 2012-08-07 DIAGNOSIS — R1909 Other intra-abdominal and pelvic swelling, mass and lump: Secondary | ICD-10-CM | POA: Insufficient documentation

## 2012-08-07 MED ORDER — IOHEXOL 300 MG/ML  SOLN
100.0000 mL | Freq: Once | INTRAMUSCULAR | Status: AC | PRN
  Administered 2012-08-07: 100 mL via INTRAVENOUS

## 2012-08-08 ENCOUNTER — Other Ambulatory Visit (HOSPITAL_BASED_OUTPATIENT_CLINIC_OR_DEPARTMENT_OTHER): Payer: Medicare Other | Admitting: Lab

## 2012-08-08 ENCOUNTER — Ambulatory Visit (HOSPITAL_BASED_OUTPATIENT_CLINIC_OR_DEPARTMENT_OTHER): Payer: Medicare Other

## 2012-08-08 VITALS — BP 119/63 | HR 68 | Temp 98.4°F

## 2012-08-08 DIAGNOSIS — Z5111 Encounter for antineoplastic chemotherapy: Secondary | ICD-10-CM

## 2012-08-08 DIAGNOSIS — C569 Malignant neoplasm of unspecified ovary: Secondary | ICD-10-CM

## 2012-08-08 LAB — CBC WITH DIFFERENTIAL/PLATELET
Basophils Absolute: 0 10*3/uL (ref 0.0–0.1)
EOS%: 0.3 % (ref 0.0–7.0)
HGB: 9.5 g/dL — ABNORMAL LOW (ref 11.6–15.9)
MCH: 30.3 pg (ref 25.1–34.0)
MONO%: 14.6 % — ABNORMAL HIGH (ref 0.0–14.0)
NEUT#: 1.7 10*3/uL (ref 1.5–6.5)
RBC: 3.14 10*6/uL — ABNORMAL LOW (ref 3.70–5.45)
RDW: 21.6 % — ABNORMAL HIGH (ref 11.2–14.5)
lymph#: 1.2 10*3/uL (ref 0.9–3.3)
nRBC: 0 % (ref 0–0)

## 2012-08-08 MED ORDER — SODIUM CHLORIDE 0.9 % IV SOLN
400.0000 mg | Freq: Once | INTRAVENOUS | Status: AC
  Administered 2012-08-08: 400 mg via INTRAVENOUS
  Filled 2012-08-08: qty 40

## 2012-08-08 MED ORDER — HEPARIN SOD (PORK) LOCK FLUSH 100 UNIT/ML IV SOLN
500.0000 [IU] | Freq: Once | INTRAVENOUS | Status: AC | PRN
  Administered 2012-08-08: 500 [IU]
  Filled 2012-08-08: qty 5

## 2012-08-08 MED ORDER — DEXAMETHASONE SODIUM PHOSPHATE 10 MG/ML IJ SOLN
10.0000 mg | Freq: Once | INTRAMUSCULAR | Status: AC
  Administered 2012-08-08: 10 mg via INTRAVENOUS

## 2012-08-08 MED ORDER — SODIUM CHLORIDE 0.9 % IV SOLN
Freq: Once | INTRAVENOUS | Status: AC
  Administered 2012-08-08: 14:00:00 via INTRAVENOUS

## 2012-08-08 MED ORDER — PACLITAXEL CHEMO INJECTION 300 MG/50ML
65.0000 mg/m2 | Freq: Once | INTRAVENOUS | Status: AC
  Administered 2012-08-08: 102 mg via INTRAVENOUS
  Filled 2012-08-08: qty 17

## 2012-08-08 MED ORDER — DIPHENHYDRAMINE HCL 50 MG/ML IJ SOLN
25.0000 mg | Freq: Once | INTRAMUSCULAR | Status: AC
  Administered 2012-08-08: 25 mg via INTRAVENOUS

## 2012-08-08 MED ORDER — SODIUM CHLORIDE 0.9 % IJ SOLN
10.0000 mL | INTRAMUSCULAR | Status: DC | PRN
Start: 2012-08-08 — End: 2012-08-08
  Administered 2012-08-08: 10 mL
  Filled 2012-08-08: qty 10

## 2012-08-08 MED ORDER — ONDANSETRON 16 MG/50ML IVPB (CHCC)
16.0000 mg | Freq: Once | INTRAVENOUS | Status: AC
  Administered 2012-08-08: 16 mg via INTRAVENOUS

## 2012-08-08 MED ORDER — FAMOTIDINE IN NACL 20-0.9 MG/50ML-% IV SOLN
20.0000 mg | Freq: Once | INTRAVENOUS | Status: AC
  Administered 2012-08-08: 20 mg via INTRAVENOUS

## 2012-08-08 NOTE — Patient Instructions (Signed)
[facility] Discharge Instructions for Patients Receiving Chemotherapy  Today you received the following chemotherapy agents Taxol/Carboplatin To help prevent nausea and vomiting after your treatment, we encourage you to take your nausea medication as prescribed.  If you develop nausea and vomiting that is not controlled by your nausea medication, call the clinic. If it is after clinic hours your family physician or the after hours number for the clinic or go to the Emergency Department.   BELOW ARE SYMPTOMS THAT SHOULD BE REPORTED IMMEDIATELY:  *FEVER GREATER THAN 100.5 F  *CHILLS WITH OR WITHOUT FEVER  NAUSEA AND VOMITING THAT IS NOT CONTROLLED WITH YOUR NAUSEA MEDICATION  *UNUSUAL SHORTNESS OF BREATH  *UNUSUAL BRUISING OR BLEEDING  TENDERNESS IN MOUTH AND THROAT WITH OR WITHOUT PRESENCE OF ULCERS  *URINARY PROBLEMS  *BOWEL PROBLEMS  UNUSUAL RASH Items with * indicate a potential emergency and should be followed up as soon as possible.  One of the nurses will contact you 24 hours after your treatment. Please let the nurse know about any problems that you may have experienced. Feel free to call the clinic you have any questions or concerns. The clinic phone number is [phone].   I have been informed and understand all the instructions given to me. I know to contact the clinic, my physician, or go to the Emergency Department if any problems should occur. I do not have any questions at this time, but understand that I may call the clinic during office hours   should I have any questions or need assistance in obtaining follow up care.    __________________________________________  _____________  __________ Signature of Patient or Authorized Representative            Date                   Time    __________________________________________ Nurse's Signature    

## 2012-08-09 ENCOUNTER — Other Ambulatory Visit: Payer: Self-pay | Admitting: Certified Registered Nurse Anesthetist

## 2012-08-11 ENCOUNTER — Telehealth (INDEPENDENT_AMBULATORY_CARE_PROVIDER_SITE_OTHER): Payer: Self-pay

## 2012-08-11 NOTE — Telephone Encounter (Signed)
Called pt to let her know that Dr Truett Perna spoke to Dr Dwain Sarna about the ostomy reversal and they both agreed they want the pt to see DR Loree Fee. They want the pt to continue chemo for now and see the gynecologist to check on her cancer. We will wait to hear back from Dr Truett Perna and get back in touch with pt per Dr Dwain Sarna.. The pt understands.

## 2012-08-13 ENCOUNTER — Other Ambulatory Visit: Payer: Self-pay | Admitting: Oncology

## 2012-08-16 ENCOUNTER — Ambulatory Visit (HOSPITAL_BASED_OUTPATIENT_CLINIC_OR_DEPARTMENT_OTHER): Payer: Medicare Other | Admitting: Oncology

## 2012-08-16 ENCOUNTER — Telehealth: Payer: Self-pay | Admitting: *Deleted

## 2012-08-16 ENCOUNTER — Other Ambulatory Visit (HOSPITAL_BASED_OUTPATIENT_CLINIC_OR_DEPARTMENT_OTHER): Payer: Medicare Other | Admitting: Lab

## 2012-08-16 ENCOUNTER — Ambulatory Visit (HOSPITAL_BASED_OUTPATIENT_CLINIC_OR_DEPARTMENT_OTHER): Payer: Medicare Other

## 2012-08-16 ENCOUNTER — Telehealth: Payer: Self-pay | Admitting: Oncology

## 2012-08-16 VITALS — BP 95/64 | HR 73 | Temp 97.3°F | Resp 18 | Ht 60.0 in | Wt 131.1 lb

## 2012-08-16 DIAGNOSIS — I4891 Unspecified atrial fibrillation: Secondary | ICD-10-CM

## 2012-08-16 DIAGNOSIS — IMO0002 Reserved for concepts with insufficient information to code with codable children: Secondary | ICD-10-CM

## 2012-08-16 DIAGNOSIS — C569 Malignant neoplasm of unspecified ovary: Secondary | ICD-10-CM

## 2012-08-16 DIAGNOSIS — Z7901 Long term (current) use of anticoagulants: Secondary | ICD-10-CM

## 2012-08-16 DIAGNOSIS — Z5111 Encounter for antineoplastic chemotherapy: Secondary | ICD-10-CM

## 2012-08-16 LAB — CBC WITH DIFFERENTIAL/PLATELET
Basophils Absolute: 0 10*3/uL (ref 0.0–0.1)
EOS%: 0.4 % (ref 0.0–7.0)
Eosinophils Absolute: 0 10*3/uL (ref 0.0–0.5)
HCT: 29.9 % — ABNORMAL LOW (ref 34.8–46.6)
HGB: 9.8 g/dL — ABNORMAL LOW (ref 11.6–15.9)
MCH: 30.1 pg (ref 25.1–34.0)
MCV: 91.7 fL (ref 79.5–101.0)
NEUT%: 57.4 % (ref 38.4–76.8)
lymph#: 0.9 10*3/uL (ref 0.9–3.3)

## 2012-08-16 LAB — PROTIME-INR: INR: 1.8 — ABNORMAL LOW (ref 2.00–3.50)

## 2012-08-16 MED ORDER — DEXAMETHASONE SODIUM PHOSPHATE 10 MG/ML IJ SOLN
10.0000 mg | Freq: Once | INTRAMUSCULAR | Status: AC
  Administered 2012-08-16: 10 mg via INTRAVENOUS

## 2012-08-16 MED ORDER — ONDANSETRON 8 MG/50ML IVPB (CHCC)
8.0000 mg | Freq: Once | INTRAVENOUS | Status: AC
  Administered 2012-08-16: 8 mg via INTRAVENOUS

## 2012-08-16 MED ORDER — PACLITAXEL CHEMO INJECTION 300 MG/50ML
65.0000 mg/m2 | Freq: Once | INTRAVENOUS | Status: AC
  Administered 2012-08-16: 102 mg via INTRAVENOUS
  Filled 2012-08-16: qty 17

## 2012-08-16 MED ORDER — DIPHENHYDRAMINE HCL 50 MG/ML IJ SOLN
25.0000 mg | Freq: Once | INTRAMUSCULAR | Status: AC
  Administered 2012-08-16: 25 mg via INTRAVENOUS

## 2012-08-16 MED ORDER — HEPARIN SOD (PORK) LOCK FLUSH 100 UNIT/ML IV SOLN
500.0000 [IU] | Freq: Once | INTRAVENOUS | Status: AC | PRN
  Administered 2012-08-16: 500 [IU]
  Filled 2012-08-16: qty 5

## 2012-08-16 MED ORDER — FAMOTIDINE IN NACL 20-0.9 MG/50ML-% IV SOLN
20.0000 mg | Freq: Once | INTRAVENOUS | Status: AC
  Administered 2012-08-16: 20 mg via INTRAVENOUS

## 2012-08-16 MED ORDER — SODIUM CHLORIDE 0.9 % IV SOLN
Freq: Once | INTRAVENOUS | Status: AC
  Administered 2012-08-16: 11:00:00 via INTRAVENOUS

## 2012-08-16 MED ORDER — SODIUM CHLORIDE 0.9 % IJ SOLN
10.0000 mL | INTRAMUSCULAR | Status: DC | PRN
  Administered 2012-08-16: 10 mL
  Filled 2012-08-16: qty 10

## 2012-08-16 NOTE — Progress Notes (Signed)
[facility]    OFFICE PROGRESS NOTE   INTERVAL HISTORY:   She began cycle 4 of Taxol/carboplatin chemotherapy on 08/08/2012. She had one day of nausea last week. No neuropathy symptoms. Good energy level and appetite. She is bothered by prolapse of the colon at the colostomy site.  Objective:  Vital signs in last 24 hours:  Blood pressure 95/64, pulse 73, temperature 97.3 F (36.3 C), temperature source Oral, resp. rate 18, height 5' (1.524 m), weight 131 lb 1.6 oz (59.467 kg).    HEENT: No thrush or ulcers Resp: Lungs clear bilaterally Cardio: Regular rate and rhythm GI: No hepatomegaly, there is prolapsing bowel in the colostomy bag Vascular: No leg edema    Portacath/PICC-without erythema  Lab Results:  Lab Results  Component Value Date   WBC 2.8* 08/16/2012   HGB 9.8* 08/16/2012   HCT 29.9* 08/16/2012   MCV 91.7 08/16/2012   PLT 224 08/16/2012   ANC 1.6 PT/INR 1.8  CA 125 on 07/18/2012-11.3 X-rays: CT of the abdomen and pelvis on 06/07/2013-normal liver and adrenal glands. No retroperitoneal adenopathy. Near complete resolution of the pelvic masses. A previous 9.4 x 6.7 cm right-sided lesion measures 5.6 x 2.2 cm. No new pelvic mass.    Medications: I have reviewed the patient's current medications.  Assessment/Plan: 1. Ovarian cancer-presenting with a Rectal mass , status post sigmoidoscopy 03/13/2012 with findings of a 5 cm malignant appearing mass with central ulceration in the rectum 2-3 cm proximal to the anal verge. Pathology showed moderate to poorly differentiated adenocarcinoma with ulceration and prolapse changes. By immunohistochemistry the malignant cells were positive for cytokeratin 7, estrogen receptor and WT-1. They were negative for cytokeratin 20 and CDX-2. This immunohistochemical profile was strongly suggestive of a gynecologic primary.   -Status post 3 cycles of Taxol/carboplatin chemotherapy with normalization of the CA 125,  restaging CT 08/07/2012 with marketed improvement in the pelvic masses and no evidence of progressive ovarian cancer 2. CT abdomen/pelvis on 03/02/2012 with a 6.7 cm mass along the right aspect of the rectum; 2 adjacent nodal masses in the right mid abdomen measuring 8.9 x 7.4 x 7.2 cm and an additional 3.0 x 3.0 x 2.6 cm nodal mass posterior to the cecum. 3. PET scan 03/22/2012 with an intensely hypermetabolic large mass along the right aspect of the rectum; an intensely hypermetabolic right iliac fossa mass; increased caliber of small bowel loops containing air-fluid levels. 4. Hospitalization with nausea/vomiting, abdominal distention due to a small bowel obstruction status post bypass procedure and descending loop colostomy, and gastrostomy tube placement 04/04/2012. The gastrostomy tube has been removed. 5. Atrial fibrillation maintained on Coumadin.  6. Status post Port-A-Cath placement 05/04/2012.       7.History of neutropenia secondary to chemotherapy causing a treatment delay following cycle 2. The Taxol was dose reduced beginning with cycle 3       8. Prolapse,? Hernia at the colostomy site-she would like to have this repaired    Disposition:  She is currently at day 8, cycle 4 of Taxol/carboplatin chemotherapy. There has been markedly clinical, radiologic, and CA 125 improvement.  I discussed the case with Dr. Loree Fee. She will be evaluated in GYN oncology to consider the indication for debulking surgery. She is not sure whether she wants to undergo this surgery, but she would like to have the colostomy revised.  Victoria [name] is scheduled for day 15, cycle 4 on 08/22/2012. She will return for an office visit and cycle  5 on 09/05/2012.   Thornton Papas, MD  08/16/2012  4:24 PM

## 2012-08-16 NOTE — Patient Instructions (Addendum)
[facility] Discharge Instructions for Patients Receiving Chemotherapy  Today you received the following chemotherapy agents: Taxol. To help prevent nausea and vomiting after your treatment, we encourage you to take your nausea medication.    If you develop nausea and vomiting that is not controlled by your nausea medication, call the clinic. If it is after clinic hours your family physician or the after hours number for the clinic or go to the Emergency Department.   BELOW ARE SYMPTOMS THAT SHOULD BE REPORTED IMMEDIATELY:  *FEVER GREATER THAN 100.5 F  *CHILLS WITH OR WITHOUT FEVER  NAUSEA AND VOMITING THAT IS NOT CONTROLLED WITH YOUR NAUSEA MEDICATION  *UNUSUAL SHORTNESS OF BREATH  *UNUSUAL BRUISING OR BLEEDING  TENDERNESS IN MOUTH AND THROAT WITH OR WITHOUT PRESENCE OF ULCERS  *URINARY PROBLEMS  *BOWEL PROBLEMS  UNUSUAL RASH Items with * indicate a potential emergency and should be followed up as soon as possible.  Feel free to call the clinic you have any questions or concerns. The clinic phone number is [phone].

## 2012-08-16 NOTE — Telephone Encounter (Signed)
Per staff phone call and POF I have schedueld appts.  JMW  

## 2012-08-16 NOTE — Telephone Encounter (Signed)
Talked to patient and gave her appt for 08/22/12 and 09/05/12 lab, chemo and ML

## 2012-08-22 ENCOUNTER — Ambulatory Visit (HOSPITAL_BASED_OUTPATIENT_CLINIC_OR_DEPARTMENT_OTHER): Payer: Medicare Other

## 2012-08-22 ENCOUNTER — Other Ambulatory Visit (HOSPITAL_BASED_OUTPATIENT_CLINIC_OR_DEPARTMENT_OTHER): Payer: Medicare Other | Admitting: Lab

## 2012-08-22 VITALS — BP 120/61 | HR 97 | Temp 97.3°F

## 2012-08-22 DIAGNOSIS — C569 Malignant neoplasm of unspecified ovary: Secondary | ICD-10-CM

## 2012-08-22 DIAGNOSIS — Z5111 Encounter for antineoplastic chemotherapy: Secondary | ICD-10-CM

## 2012-08-22 LAB — CBC WITH DIFFERENTIAL/PLATELET
BASO%: 0 % (ref 0.0–2.0)
HCT: 28.5 % — ABNORMAL LOW (ref 34.8–46.6)
MCHC: 33.3 g/dL (ref 31.5–36.0)
MONO#: 0.2 10*3/uL (ref 0.1–0.9)
NEUT%: 54.2 % (ref 38.4–76.8)
RDW: 21 % — ABNORMAL HIGH (ref 11.2–14.5)
WBC: 2.7 10*3/uL — ABNORMAL LOW (ref 3.9–10.3)
lymph#: 1 10*3/uL (ref 0.9–3.3)
nRBC: 0 % (ref 0–0)

## 2012-08-22 MED ORDER — DIPHENHYDRAMINE HCL 50 MG/ML IJ SOLN
25.0000 mg | Freq: Once | INTRAMUSCULAR | Status: AC
  Administered 2012-08-22: 25 mg via INTRAVENOUS

## 2012-08-22 MED ORDER — PACLITAXEL CHEMO INJECTION 300 MG/50ML
65.0000 mg/m2 | Freq: Once | INTRAVENOUS | Status: AC
  Administered 2012-08-22: 102 mg via INTRAVENOUS
  Filled 2012-08-22: qty 17

## 2012-08-22 MED ORDER — FAMOTIDINE IN NACL 20-0.9 MG/50ML-% IV SOLN
20.0000 mg | Freq: Once | INTRAVENOUS | Status: AC
  Administered 2012-08-22: 20 mg via INTRAVENOUS

## 2012-08-22 MED ORDER — DEXAMETHASONE SODIUM PHOSPHATE 10 MG/ML IJ SOLN
10.0000 mg | Freq: Once | INTRAMUSCULAR | Status: AC
  Administered 2012-08-22: 10 mg via INTRAVENOUS

## 2012-08-22 MED ORDER — ONDANSETRON 8 MG/50ML IVPB (CHCC)
8.0000 mg | Freq: Once | INTRAVENOUS | Status: AC
  Administered 2012-08-22: 8 mg via INTRAVENOUS

## 2012-08-22 NOTE — Patient Instructions (Addendum)
[facility] Discharge Instructions for Patients Receiving Chemotherapy  Today you received the following chemotherapy agents:Taxol.  If you develop nausea and vomiting that is not controlled by your nausea medication, call the clinic. If it is after clinic hours your family physician or the after hours number for the clinic or go to the Emergency Department.   BELOW ARE SYMPTOMS THAT SHOULD BE REPORTED IMMEDIATELY:  *FEVER GREATER THAN 100.5 F  *CHILLS WITH OR WITHOUT FEVER  NAUSEA AND VOMITING THAT IS NOT CONTROLLED WITH YOUR NAUSEA MEDICATION  *UNUSUAL SHORTNESS OF BREATH  *UNUSUAL BRUISING OR BLEEDING  TENDERNESS IN MOUTH AND THROAT WITH OR WITHOUT PRESENCE OF ULCERS  *URINARY PROBLEMS  *BOWEL PROBLEMS  UNUSUAL RASH Items with * indicate a potential emergency and should be followed up as soon as possible.  Feel free to call the clinic you have any questions or concerns. The clinic phone number is [phone].

## 2012-08-23 ENCOUNTER — Other Ambulatory Visit: Payer: Self-pay | Admitting: Certified Registered Nurse Anesthetist

## 2012-08-24 NOTE — Telephone Encounter (Signed)
Called pt back to let her know that Dr Dwain Sarna does want to see the pt after she see's the gyn oncologist. The pt has an appt with Dr Laurette Schimke on 2/6 so I made her an appt to see Dr Dwain Sarna the next week on 2/14 arrive at 9:30. The pt understands.

## 2012-08-31 ENCOUNTER — Other Ambulatory Visit (INDEPENDENT_AMBULATORY_CARE_PROVIDER_SITE_OTHER): Payer: Self-pay | Admitting: General Surgery

## 2012-08-31 ENCOUNTER — Ambulatory Visit: Payer: Medicare Other | Attending: Gynecologic Oncology | Admitting: Gynecologic Oncology

## 2012-08-31 ENCOUNTER — Encounter: Payer: Self-pay | Admitting: Gynecologic Oncology

## 2012-08-31 VITALS — BP 94/50 | HR 78 | Temp 98.0°F | Resp 18 | Ht 61.81 in | Wt 130.0 lb

## 2012-08-31 DIAGNOSIS — Z90710 Acquired absence of both cervix and uterus: Secondary | ICD-10-CM | POA: Insufficient documentation

## 2012-08-31 DIAGNOSIS — C785 Secondary malignant neoplasm of large intestine and rectum: Secondary | ICD-10-CM | POA: Insufficient documentation

## 2012-08-31 DIAGNOSIS — I10 Essential (primary) hypertension: Secondary | ICD-10-CM | POA: Insufficient documentation

## 2012-08-31 DIAGNOSIS — I2510 Atherosclerotic heart disease of native coronary artery without angina pectoris: Secondary | ICD-10-CM | POA: Insufficient documentation

## 2012-08-31 DIAGNOSIS — Z933 Colostomy status: Secondary | ICD-10-CM | POA: Insufficient documentation

## 2012-08-31 DIAGNOSIS — Z950 Presence of cardiac pacemaker: Secondary | ICD-10-CM | POA: Insufficient documentation

## 2012-08-31 DIAGNOSIS — Z9221 Personal history of antineoplastic chemotherapy: Secondary | ICD-10-CM | POA: Insufficient documentation

## 2012-08-31 DIAGNOSIS — C569 Malignant neoplasm of unspecified ovary: Secondary | ICD-10-CM | POA: Insufficient documentation

## 2012-08-31 DIAGNOSIS — E785 Hyperlipidemia, unspecified: Secondary | ICD-10-CM | POA: Insufficient documentation

## 2012-08-31 NOTE — Progress Notes (Signed)
Office Visit: GYN ONC  Chief Complaint: Ovarian cancer, treatment planning  HPI  Victoria [name] is a lovely 79 y.o. y.o.She noted a change in bowel habits- intermittent diarrhea- in May 2013. Barium enema on 12/22/2011 showed moderately redundant colon with moderate to severe diverticulosis primarily from the splenic flexure and throughout the sigmoid. She developed rectal bleeding in June 2013. Pelvic examination by her gynecologist was notable for a rectal mass. Patient reports good appetite, abdominal bloating, 16 pound weight loss since Dec 2012.  CT scans of the abdomen and pelvis on 03/02/2012 showed a rectal mass measuring 6.7 cm and 2 adjacent nodal masses in the right mid abdomen measuring 8.9 x 7.4 x 7.2 cm suspicious for nodal metastases and an additional 3.0 x 3.0 x 2.6 cm nodal metastasis posterior to the cecum. A sigmoidoscopy on 03/13/2012 showed a 5 cm malignant appearing mass with central ulceration in the rectum about 2-3 cm proximal to the anal verge. Pathology showed moderate to poorly differentiated adenocarcinoma with ulceration and prolapse changes. Further immuniohistochemistry staining noted malignant cells positive for cytokeratin 7, estrogen receptor, and WT-1. They are negative for cytokeratin 20 and CDX-2. This immunohistochemical profile strongly suggests a gynecologic primary.   CA125 225. The patient underwent exploratory laparotomy small bowel diversion and descending colostomy performed by Dr. Emelia Loron.  She has since had 4 cycles of dose dense  Taxol carboplatin therapy and the CA 125 is now 11.7.  Imaging from 08/07/2012 demonstrates marked response to therapy.  There is near complete resolution of the pelvic masses bilaterally the right-sided lesion now measures only 5.6 x 2.2 cm and the right sided lesion 3.1 x 3.0 cm. Nonspecific edema is noted adjacent to sigmoid colon.  Victoria [name] has done very well with dose dense taxol carboplatin therapy. Her concern at  this time is the prolapse of the colon at the site of the colostomy. This is the factor that imposes negatively on her quality of life.   Past Medical History   Diagnosis  Date   .  CAD (coronary artery disease)    .  Hypertension    .  Atrial fibrillation    .  Hyperlipidemia    .  Rectal carcinoma    .  Colon cancer  03/13/12 egd     rectal mass5cm , 2-3cm proximal to anal verge   .  Diverticulosis    .  Allergy    .  Anxiety      rhematoid   .  Vomiting      started on 03/23/12   .  Obstruction colon  03/24/12     family told obstruction may be outside colon    Past Surgical History   Procedure  Date   .  Pace maker  2010   .  Rib removed  1934     Pleurisy and pneumonia   .  Partial hysterectomy  1972   .  Bladder neck suspension  1986   .  Heart catherization  [id number]   .  Abdominal hysterectomy      both ovaries intact,    ROS ; denies headache cough chest pain hemoptysis. No abdominal pain reports reducible prolapse of her ostomy and discomfort at the hernia site. Denies rectal bleeding, denies vaginal bleeding abdominal pain nausea vomiting, denies adenopathy or skin changes   Physical Exam  WD female in NAD  BP 94/50  Pulse 78  Temp 98 F (36.7 C) (Oral)  Resp 18  Ht  5' 1.81" (1.57 m)  Wt 130 lb (58.968 kg)  BMI 23.92 kg/m2 HEENT: Normocephalic, NROM  LN; No cervical supraclavicular or inguinal adenopathy  ABDNOMEN: Soft distended no palpable omental cake no tenderness or guarding, prolapsing colostomy approximately 20 cm easily reducible  BACK: No CVA tenderness  CHEST: Symmetrical chest excursions  HEART: RRR  PELVIC: Normal external genitalia Bartholin's urethra and Skene's glands. No palpable masses within the vagina however in the distal vagina RECTAL: Good tone no palpable pelvic masses or cul-de-sac nodularity or masses EXT: No CCE   Assessment/Plan: 79 y.o.  year old who initially presented with a large necrotic pelvic mass with rectal erosion,  SBO,  Elevated  CA 125 is elevated and immunostains suggest GYN pathology. She subsequently underwent small bowel bypass for management of small bowel obstruction and a colostomy. She's had an excellent response to dose dense Taxol carboplatin chemotherapy and now prevents for assessment regarding the options.  The options presented to the patient were that of interval debulking with intraoperative assessment regarding the best way to manage the colostomy. The options for colostomy would include correction of the prolapse versus removal of the ostomy. This large part Victoria [name] be dependent upon the findings of the state of residual disease and the distal rectum. Victoria [name] is very motivated to have a surgical procedure because of the discomfort associated with the ostomy.   She has just completed cycle 4 of Taxol carboplatin therapy. My recommendation was for interval debulking and revision or reversal of the colostomy on 10/03/2012. Dr. Emelia Loron and I discussed the case and it is his intent at her visit on September 08, 2012 to discuss the options.  The patient will present on September 28, 2012 for a preop visit.

## 2012-08-31 NOTE — Patient Instructions (Addendum)
My recommendation is  for interval debulking and revision or reversal of the colostomy on 10/03/2012. Dr. Emelia Loron and I discussed the case and it is his intent at your visit on September 08, 2012 to discuss the options.   Thank you very much [name] for allowing me to provide care for you today.  I appreciate your confidence in choosing our Gynecologic Oncology team.  If you have any questions about your visit today please call our office and we will get back to you as soon as possible.  [name]. [name] MD., PhD Gynecologic Oncology

## 2012-09-01 ENCOUNTER — Telehealth: Payer: Self-pay | Admitting: *Deleted

## 2012-09-01 NOTE — Telephone Encounter (Signed)
Message from pt reporting she was told to cancel chemo by Dr. Nelly Rout. Surgery is tentatively scheduled for 10/10/12. Per Dr. Truett Perna: keep office visit, cancel chemo. Chemo appt canceled.

## 2012-09-05 ENCOUNTER — Ambulatory Visit: Payer: Medicare Other

## 2012-09-05 ENCOUNTER — Ambulatory Visit (HOSPITAL_BASED_OUTPATIENT_CLINIC_OR_DEPARTMENT_OTHER): Payer: Medicare Other | Admitting: Nurse Practitioner

## 2012-09-05 ENCOUNTER — Other Ambulatory Visit (HOSPITAL_BASED_OUTPATIENT_CLINIC_OR_DEPARTMENT_OTHER): Payer: Medicare Other | Admitting: Lab

## 2012-09-05 ENCOUNTER — Telehealth: Payer: Self-pay | Admitting: Oncology

## 2012-09-05 VITALS — BP 124/67 | HR 61 | Temp 97.1°F | Resp 18 | Ht 62.0 in | Wt 133.2 lb

## 2012-09-05 DIAGNOSIS — I4891 Unspecified atrial fibrillation: Secondary | ICD-10-CM

## 2012-09-05 DIAGNOSIS — C785 Secondary malignant neoplasm of large intestine and rectum: Secondary | ICD-10-CM

## 2012-09-05 DIAGNOSIS — Z7901 Long term (current) use of anticoagulants: Secondary | ICD-10-CM

## 2012-09-05 DIAGNOSIS — C569 Malignant neoplasm of unspecified ovary: Secondary | ICD-10-CM

## 2012-09-05 DIAGNOSIS — R42 Dizziness and giddiness: Secondary | ICD-10-CM

## 2012-09-05 LAB — CBC WITH DIFFERENTIAL/PLATELET
BASO%: 0.7 % (ref 0.0–2.0)
EOS%: 0.8 % (ref 0.0–7.0)
LYMPH%: 35.5 % (ref 14.0–49.7)
MCH: 33.4 pg (ref 25.1–34.0)
MCHC: 34.4 g/dL (ref 31.5–36.0)
MCV: 97.1 fL (ref 79.5–101.0)
MONO%: 17.4 % — ABNORMAL HIGH (ref 0.0–14.0)
NEUT#: 0.9 10*3/uL — ABNORMAL LOW (ref 1.5–6.5)
RBC: 2.59 10*6/uL — ABNORMAL LOW (ref 3.70–5.45)
RDW: 24.4 % — ABNORMAL HIGH (ref 11.2–14.5)

## 2012-09-05 LAB — PROTIME-INR
INR: 1.9 — ABNORMAL LOW (ref 2.00–3.50)
Protime: 22.8 Seconds — ABNORMAL HIGH (ref 10.6–13.4)

## 2012-09-05 LAB — COMPREHENSIVE METABOLIC PANEL (CC13)
ALT: 13 U/L (ref 0–55)
AST: 19 U/L (ref 5–34)
Albumin: 3.3 g/dL — ABNORMAL LOW (ref 3.5–5.0)
CO2: 26 mEq/L (ref 22–29)
Calcium: 9.1 mg/dL (ref 8.4–10.4)
Chloride: 104 mEq/L (ref 98–107)
Potassium: 3.9 mEq/L (ref 3.5–5.1)

## 2012-09-05 LAB — CA 125: CA 125: 11.4 U/mL (ref 0.0–30.2)

## 2012-09-05 MED ORDER — LEVOFLOXACIN 250 MG PO TABS: 250.0000 mg | ORAL_TABLET | Freq: Every day | ORAL | Status: DC

## 2012-09-05 NOTE — Telephone Encounter (Signed)
gv and printed appt schedule for pt for Feb and March °

## 2012-09-05 NOTE — Progress Notes (Signed)
OFFICE PROGRESS NOTE  Interval history:  Victoria [name] returns as scheduled. She completed cycle 4 Taxol/carboplatin on 08/22/2012. She denies nausea/vomiting. No mouth sores. No diarrhea. Colostomy is functioning normally. No neuropathy symptoms. She has a good appetite Over the past several weeks she has noted bilateral leg edema right greater than left. She denies calf pain. She reports mild dizziness over the past several mornings. She attributes the dizziness to "an inner ear problem" that she has had intermittently since childhood. She reports needing an antibiotic for the dizziness to resolve.  Objective: Blood pressure 124/67, pulse 61, temperature 97.1 F (36.2 C), temperature source Oral, resp. rate 18, height 5\' 2"  (1.575 m), weight 133 lb 3.2 oz (60.419 kg).  Oropharynx is without thrush or ulceration. Lungs are clear. Breath sounds are diminished at the right base. Regular cardiac rhythm. Port-A-Cath site is without erythema. Abdomen is soft and nontender. No hepatomegaly. Left lower quadrant colostomy. Trace to 1+ pitting lower leg edema bilaterally. Calves are soft and nontender. The right leg is slightly more edematous than the left leg.  Lab Results: Lab Results  Component Value Date   WBC 2.1* 09/05/2012   HGB 8.6* 09/05/2012   HCT 25.1* 09/05/2012   MCV 97.1 09/05/2012   PLT 195 09/05/2012    Chemistry:    Chemistry      Component Value Date/Time   NA 136 09/05/2012 0939   NA 131* 05/03/2012 1340   K 3.9 09/05/2012 0939   K 4.2 05/03/2012 1340   CL 104 09/05/2012 0939   CL 95* 05/03/2012 1340   CO2 26 09/05/2012 0939   CO2 27 05/03/2012 1340   BUN 11.1 09/05/2012 0939   BUN 12 05/03/2012 1340   CREATININE 0.8 09/05/2012 0939   CREATININE 0.74 05/03/2012 1340      Component Value Date/Time   CALCIUM 9.1 09/05/2012 0939   CALCIUM 9.0 05/03/2012 1340   ALKPHOS 86 09/05/2012 0939   ALKPHOS 182* 04/13/2012 0423   AST 19 09/05/2012 0939   AST 22 04/13/2012 0423   ALT 13 09/05/2012 0939    ALT 16 04/13/2012 0423   BILITOT 0.28 09/05/2012 0939   BILITOT 0.2* 04/13/2012 0423       Studies/Results: Ct Abdomen Pelvis W Contrast  08/07/2012  *RADIOLOGY REPORT*  Clinical Data: Restaging of ovarian cancer.  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  Colostomy.  Ongoing chemotherapy.  Small bowel obstruction, status post surgery 09/13.  CT ABDOMEN AND PELVIS WITH CONTRAST  Technique:  Multidetector CT imaging of the abdomen and pelvis was performed following the standard protocol during bolus administration of intravenous contrast.  Contrast: OMNIPAQUE IOHEXOL 300 MG/ML  SOLN  Comparison: 04/03/2012  Findings: Lung bases:  Volume loss the right lung base. Cardiomegaly with a dual lead pacer identified.  Right-sided pleural thickening or trace fluid.  Abdomen/pelvis:  Motion degradation involving the abdomen.  Normal liver, spleen, stomach, pancreas, gallbladder, biliary tract, adrenal glands.  Mild left renal cortical thinning.  Normal right kidney.  No retroperitoneal or retrocrural adenopathy.  Extensive colonic diverticulosis.  There is mild interstitial thickening adjacent the sigmoid, including on image 48 and 49 of series 2.  There is a "double barrel" type left lower quadrant colostomy. Normal terminal ileum.  Mild nodularity within the ostomy fat on image 38/series 2 is nonspecific.  There has been prior enterotomy.  There is no small bowel obstruction.  No pelvic sidewall adenopathy.  Near complete resolution of pelvic masses.  At the site of  the 9.4 x 6.7 cm right-sided lesion on the prior exam, is a low density residua measuring 5.6 x 2.2 cm  on image 54/series 2.  The eccentric right cul-de-sac lesion is nearly completely resolved.  There is soft tissue in the right sided ischioanal fossa measuring 3.1 x 3.0 cm on image 69/series 2.  This is at the site of the 6.9 x 6.2 cm lesion on the prior.  No gross communication with the adjacent anus/rectum.  No new pelvic mass. Celiac axis  narrowing on sagittal image 63 which is chronic.  Bones/Musculoskeletal:  Mild osteopenia.  IMPRESSION: 1.  Response to therapy of pelvic masses. 2.  No new or progressive disease identified. 3.  Interval left lower quadrant colostomy.  Apparent nodularity within the ostomy fat is favored to be related to a diverticulum. Recommend followup attention to exclude isolated peritoneal metastasis (less likely). 4.  Nonspecific edema adjacent the sigmoid colon.  Cannot exclude mild uncomplicated diverticulitis.  Correlate with symptoms of left lower quadrant pain.   Original Report Authenticated By: Jeronimo Greaves, M.D.     Medications: I have reviewed the patient's current medications.  Assessment/Plan:  1. Ovarian cancer-presenting with a Rectal mass , status post sigmoidoscopy 03/13/2012 with findings of a 5 cm malignant appearing mass with central ulceration in the rectum 2-3 cm proximal to the anal verge. Pathology showed moderate to poorly differentiated adenocarcinoma with ulceration and prolapse changes. By immunohistochemistry the malignant cells were positive for cytokeratin 7, estrogen receptor and WT-1. They were negative for cytokeratin 20 and CDX-2. This immunohistochemical profile was strongly suggestive of a gynecologic primary. -Status post 3 cycles of Taxol/carboplatin chemotherapy with normalization of the CA 125, restaging CT 08/07/2012 with marketed improvement in the pelvic masses and no evidence of progressive ovarian cancer. She completed cycle 4 beginning 08/08/2012. 2. CT abdomen/pelvis on 03/02/2012 with a 6.7 cm mass along the right aspect of the rectum; 2 adjacent nodal masses in the right mid abdomen measuring 8.9 x 7.4 x 7.2 cm and an additional 3.0 x 3.0 x 2.6 cm nodal mass posterior to the cecum. 3. PET scan 03/22/2012 with an intensely hypermetabolic large mass along the right aspect of the rectum; an intensely hypermetabolic right iliac fossa mass; increased caliber of small bowel  loops containing air-fluid levels. 4. Hospitalization with nausea/vomiting, abdominal distention due to a small bowel obstruction status post bypass procedure and descending loop colostomy, and gastrostomy tube placement 04/04/2012. The gastrostomy tube has been removed. 5. Atrial fibrillation maintained on Coumadin.  6. Status post Port-A-Cath placement 05/04/2012. 7. History of neutropenia secondary to chemotherapy causing a treatment delay following cycle 2. The Taxol was dose reduced beginning with cycle 3. 8. Prolapsed colostomy. 9. Intermittent  "dizziness" with history of chronic inner ear problems. She will complete a 4 day course of Levaquin.  Disposition-Victoria [name] appears stable. She has completed 4 cycles of Taxol/carboplatin with clinical, radiographic and CA 125 improvement. She has been evaluated by Dr. Nelly Rout with plans for debulking surgery and colostomy revision/reversal tentatively scheduled on 10/03/2012. We are placing further chemotherapy on hold pending the upcoming surgery. She will return in 2 weeks for a followup CBC, PT/INR and Port-A-Cath flush. She will return for a followup visit on 10/16/2012. She will contact the office in the interim with any problems.  Plan reviewed with Dr. Truett Perna.      Lonna Cobb ANP/GNP-BC

## 2012-09-08 ENCOUNTER — Encounter (INDEPENDENT_AMBULATORY_CARE_PROVIDER_SITE_OTHER): Payer: Medicare Other | Admitting: General Surgery

## 2012-09-11 ENCOUNTER — Encounter (INDEPENDENT_AMBULATORY_CARE_PROVIDER_SITE_OTHER): Payer: Self-pay | Admitting: General Surgery

## 2012-09-11 ENCOUNTER — Ambulatory Visit (INDEPENDENT_AMBULATORY_CARE_PROVIDER_SITE_OTHER): Payer: Medicare Other | Admitting: General Surgery

## 2012-09-11 VITALS — BP 110/64 | HR 60 | Temp 97.1°F | Resp 16 | Ht 61.0 in | Wt 131.2 lb

## 2012-09-11 DIAGNOSIS — K6389 Other specified diseases of intestine: Secondary | ICD-10-CM

## 2012-09-11 DIAGNOSIS — K634 Enteroptosis: Secondary | ICD-10-CM

## 2012-09-11 NOTE — Progress Notes (Signed)
Patient ID: Victoria [name], female   DOB: 11/18/1926, 79 y.o.   MRN: [id number]  Chief Complaint  Patient presents with  . Other    discuss ostomy w/poss hernia    HPI Victoria [name] is a 79 y.o. female.   HPI This is an 78 year old female who I met while I was the hospital physician. She then admitted with a small bowel obstruction and a newly diagnosed what appeared to be an ovarian cancer. This was diagnosed with the presence of a rectal mass and biopsy. I eventually ended up taking her to the operating room and performing a bypass of her obstructed small intestine as well as a sigmoid colostomy. She spent a total of 3 weeks in the hospital both before and after surgery. This was made more difficult by her atrial fibrillation and her anti-coagulation. She eventually left the hospital. She had a port placed. She had been receiving chemotherapy with Dr. Truett Perna. She says she tolerated this quite well and has had a reduction in her disease burden by scan. Since then she has also had prolapse of her colostomy. This is actually her biggest complaint when I saw her previously with this as well as today. She also had a gastrostomy tube which I have removed. She has been seen by both Dr. Nelly Rout or Dr. Truett Perna as well. She comes in today to discuss surgery. My role for this will be to discuss either revise her colostomy or to discuss putting her back together per her request. She has no real complaints today except for her colostomy prolapse as well as the laxity of her entire abdominal wall especially on the left side. It is Dr. Forrestine Him plan to combine surgery to remove as much of her disease as possible.  She cannot really wait much longer due to prolapse of her stoma.   Past Medical History  Diagnosis Date  . Hypertension   . Atrial fibrillation   . Hyperlipidemia   . Rectal carcinoma   . Colon cancer 03/13/12 egd    rectal mass5cm  , 2-3cm proximal to anal verge  . Diverticulosis   .  Allergy   . Anxiety     rhematoid  . Pacemaker   . Coronary artery disease   . Colostomy in place     Past Surgical History  Procedure Laterality Date  . Pace maker  2010  . Rib removed  1934    Pleurisy and pneumonia  . Partial hysterectomy  1972  . Bladder neck suspension  1986  . Heart catherization  [id number]  . Abdominal hysterectomy      both ovaries intact,   . Colon resection  04/04/2012    Procedure: COLON RESECTION;  Surgeon: Emelia Loron, MD;  Location: [facility];  Service: General;  Laterality: N/A;  laparotomy with small bowel resection for obstruction with colostomy with gastrostomy tube placement.   . Portacath placement  05/04/2012    Procedure: INSERTION PORT-A-CATH;  Surgeon: Emelia Loron, MD;  Location: [facility];  Service: General;  Laterality: Right;    Family History  Problem Relation Age of Onset  . Heart disease Mother     Social History History  Substance Use Topics  . Smoking status: Never Smoker   . Smokeless tobacco: Never Used  . Alcohol Use: No    Allergies  Allergen Reactions  . Pindolol Swelling    Throat swelled up  . Iodine   . Shellfish Allergy   . Vibramycin (Doxycycline Calcium) Nausea Only  .  Doxycycline Nausea Only  . Multaq (Dronedarone)     CAUSED SEVERE BURNING GI TRACT    Current Outpatient Prescriptions  Medication Sig Dispense Refill  . diltiazem (CARDIZEM CD) 240 MG 24 hr capsule Take 240 mg by mouth every morning.      . hydrocortisone (ANUSOL-HC) 25 MG suppository Use daily as needed  12 suppository  1  . levofloxacin (LEVAQUIN) 250 MG tablet Take 1 tablet (250 mg total) by mouth daily.  4 tablet  0  . lidocaine-prilocaine (EMLA) cream Apply topically as needed. Apply to [facility] site 1-2 hours prior to stick and cover with plastic wrap to numb site  30 g  prn  . metoprolol succinate (TOPROL-XL) 25 MG 24 hr tablet Take 25 mg by mouth every morning.       . promethazine (PHENERGAN) 12.5 MG tablet Take 12.5 mg by  mouth every 6 (six) hours as needed.      . simvastatin (ZOCOR) 20 MG tablet Take 20 mg by mouth every evening.      . warfarin (COUMADIN) 5 MG tablet Take 5 mg by mouth at bedtime. As directed       No current facility-administered medications for this visit.   Facility-Administered Medications Ordered in Other Visits  Medication Dose Route Frequency Provider Last Rate Last Dose  . heparin 6,000 Units in sodium chloride irrigation 0.9 % 500 mL irrigation   Irrigation Once Emelia Loron, MD      . sodium chloride 0.9 % injection 10 mL  10 mL Intracatheter PRN Ladene Artist, MD   10 mL at 07/18/12 1248    Review of Systems Review of Systems  Respiratory: Negative for shortness of breath.   Cardiovascular: Negative for chest pain.  Gastrointestinal: Positive for abdominal distention. Negative for abdominal pain.    Blood pressure 110/64, pulse 60, temperature 97.1 F (36.2 C), temperature source Temporal, resp. rate 16, height 5\' 1"  (1.549 m), weight 131 lb 3.2 oz (59.512 kg).  Physical Exam Physical Exam  Vitals reviewed. Constitutional: She appears well-developed and well-nourished.  Cardiovascular: Normal rate and normal heart sounds.   Pulmonary/Chest: Effort normal and breath sounds normal.  Abdominal: Soft. Bowel sounds are normal. She exhibits no distension. There is no tenderness.      Data Reviewed *RADIOLOGY REPORT*  Clinical Data: Restaging of ovarian cancer. Diagnosed 8/13 with  metastatic rectal tumor. Colostomy. Ongoing chemotherapy. Small  bowel obstruction, status post surgery 09/13.  CT ABDOMEN AND PELVIS WITH CONTRAST  Technique: Multidetector CT imaging of the abdomen and pelvis was  performed following the standard protocol during bolus  administration of intravenous contrast.  Contrast: OMNIPAQUE IOHEXOL 300 MG/ML SOLN  Comparison: 04/03/2012  Findings: Lung bases: Volume loss the right lung base.  Cardiomegaly with a dual lead pacer  identified. Right-sided  pleural thickening or trace fluid.  Abdomen/pelvis: Motion degradation involving the abdomen. Normal  liver, spleen, stomach, pancreas, gallbladder, biliary tract,  adrenal glands. Mild left renal cortical thinning. Normal right  kidney.  No retroperitoneal or retrocrural adenopathy.  Extensive colonic diverticulosis. There is mild interstitial  thickening adjacent the sigmoid, including on image 48 and 49 of  series 2. There is a "double barrel" type left lower quadrant  colostomy. Normal terminal ileum. Mild nodularity within the  ostomy fat on image 38/series 2 is nonspecific. There has been  prior enterotomy. There is no small bowel obstruction.  No pelvic sidewall adenopathy. Near complete resolution of pelvic  masses. At the site of  the 9.4 x 6.7 cm right-sided lesion on the  prior exam, is a low density residua measuring 5.6 x 2.2 cm on  image 54/series 2. The eccentric right cul-de-sac lesion is nearly  completely resolved. There is soft tissue in the right sided  ischioanal fossa measuring 3.1 x 3.0 cm on image 69/series 2. This  is at the site of the 6.9 x 6.2 cm lesion on the prior. No gross  communication with the adjacent anus/rectum.  No new pelvic mass. Celiac axis narrowing on sagittal image 63  which is chronic.  Bones/Musculoskeletal: Mild osteopenia.  IMPRESSION:  1. Response to therapy of pelvic masses.  2. No new or progressive disease identified.  3. Interval left lower quadrant colostomy. Apparent nodularity  within the ostomy fat is favored to be related to a diverticulum.  Recommend followup attention to exclude isolated peritoneal  metastasis (less likely).  4. Nonspecific edema adjacent the sigmoid colon. Cannot exclude  mild uncomplicated diverticulitis. Correlate with symptoms of left  lower quadrant pain.   Assessment    Ovarian cancer Colostomy prolapse     Plan    I had a long conversation with the patient and her  granddaughter today. We discussed the fact that her colostomy is prolapsed. She may have a parastomal hernia there also although I don't really see that on her CT scan. I told her that due to the fact that this is our over time I am concerned about I do think we should address this in the operating room. Initially she asked about putting her colon back together at the same time as Dr. Forrestine Him portion of the surgery. I told her that I do not think this is a good idea due to her presenting symptoms as well as the fact that she still has tumor in place. I think the wisest thing to do would be to revise her stoma. I discussed doing this at the same time as Dr. Nelly Rout surgery. I discussed that if there is a parastomal hernia I would repair this at the same time as well. We discussed the risks including further prolapse. She also has some significant risks in terms of bleeding, infection, long-term hernias, cardiac complications including myocardial infarction and arrhythmias as well as stroke. I don't think we have much of a choice at this point as this area is very symptomatic to her. We'll plan on proceeding on March 11 as is scheduled.       Chevonne Bostrom 09/11/2012, 5:27 PM

## 2012-09-12 ENCOUNTER — Ambulatory Visit: Payer: Medicare Other

## 2012-09-12 ENCOUNTER — Other Ambulatory Visit: Payer: Medicare Other | Admitting: Lab

## 2012-09-15 ENCOUNTER — Encounter: Payer: Self-pay | Admitting: *Deleted

## 2012-09-15 NOTE — Progress Notes (Signed)
Requested cardiac clearance form [facility], Dr Davy Pique for surgery planned for 10/03/12.

## 2012-09-18 NOTE — Progress Notes (Signed)
Need orders in [facility] for surgery on 10/03/12.  Preop 09/29/12 at 0930am.  Thanks.

## 2012-09-19 ENCOUNTER — Other Ambulatory Visit (INDEPENDENT_AMBULATORY_CARE_PROVIDER_SITE_OTHER): Payer: Self-pay | Admitting: General Surgery

## 2012-09-19 ENCOUNTER — Ambulatory Visit: Payer: Medicare Other

## 2012-09-19 ENCOUNTER — Other Ambulatory Visit (HOSPITAL_BASED_OUTPATIENT_CLINIC_OR_DEPARTMENT_OTHER): Payer: Medicare Other | Admitting: Lab

## 2012-09-19 ENCOUNTER — Ambulatory Visit (HOSPITAL_BASED_OUTPATIENT_CLINIC_OR_DEPARTMENT_OTHER): Payer: Medicare Other

## 2012-09-19 VITALS — BP 125/59 | HR 59 | Temp 98.1°F | Resp 20

## 2012-09-19 DIAGNOSIS — Z7901 Long term (current) use of anticoagulants: Secondary | ICD-10-CM

## 2012-09-19 DIAGNOSIS — C569 Malignant neoplasm of unspecified ovary: Secondary | ICD-10-CM

## 2012-09-19 LAB — CBC WITH DIFFERENTIAL/PLATELET
Eosinophils Absolute: 0 10*3/uL (ref 0.0–0.5)
MONO#: 0.5 10*3/uL (ref 0.1–0.9)
NEUT#: 2 10*3/uL (ref 1.5–6.5)
RBC: 3.18 10*6/uL — ABNORMAL LOW (ref 3.70–5.45)
RDW: 18.9 % — ABNORMAL HIGH (ref 11.2–14.5)
WBC: 3.5 10*3/uL — ABNORMAL LOW (ref 3.9–10.3)
nRBC: 0 % (ref 0–0)

## 2012-09-19 LAB — PROTIME-INR: Protime: 21.6 Seconds — ABNORMAL HIGH (ref 10.6–13.4)

## 2012-09-19 MED ORDER — SODIUM CHLORIDE 0.9 % IJ SOLN
10.0000 mL | INTRAMUSCULAR | Status: DC | PRN
  Administered 2012-09-19: 10 mL via INTRAVENOUS
  Filled 2012-09-19: qty 10

## 2012-09-19 MED ORDER — HEPARIN SOD (PORK) LOCK FLUSH 100 UNIT/ML IV SOLN
500.0000 [IU] | Freq: Once | INTRAVENOUS | Status: AC
  Administered 2012-09-19: 500 [IU] via INTRAVENOUS
  Filled 2012-09-19: qty 5

## 2012-09-25 ENCOUNTER — Encounter (HOSPITAL_COMMUNITY): Payer: Self-pay | Admitting: Pharmacy Technician

## 2012-09-26 ENCOUNTER — Telehealth: Payer: Self-pay | Admitting: Gynecologic Oncology

## 2012-09-28 ENCOUNTER — Encounter (HOSPITAL_COMMUNITY): Payer: Self-pay

## 2012-09-28 ENCOUNTER — Ambulatory Visit (HOSPITAL_COMMUNITY)
Admission: RE | Admit: 2012-09-28 | Discharge: 2012-09-28 | Disposition: A | Discharge status: Home / Self Care | Payer: Medicare Other | Source: Ambulatory Visit | Attending: Gynecologic Oncology | Admitting: Gynecologic Oncology

## 2012-09-28 ENCOUNTER — Ambulatory Visit: Payer: Medicare Other | Attending: Gynecologic Oncology | Admitting: Gynecologic Oncology

## 2012-09-28 ENCOUNTER — Encounter: Payer: Self-pay | Admitting: Gynecologic Oncology

## 2012-09-28 ENCOUNTER — Telehealth (INDEPENDENT_AMBULATORY_CARE_PROVIDER_SITE_OTHER): Payer: Self-pay

## 2012-09-28 ENCOUNTER — Encounter (HOSPITAL_COMMUNITY)
Admission: RE | Admit: 2012-09-28 | Discharge: 2012-09-28 | Disposition: A | Discharge status: Home / Self Care | Payer: Medicare Other | Source: Ambulatory Visit | Attending: Gynecologic Oncology | Admitting: Gynecologic Oncology

## 2012-09-28 VITALS — BP 102/54 | HR 62 | Temp 98.0°F | Resp 16 | Ht 61.81 in | Wt 134.0 lb

## 2012-09-28 DIAGNOSIS — K5669 Other intestinal obstruction: Secondary | ICD-10-CM | POA: Insufficient documentation

## 2012-09-28 DIAGNOSIS — K626 Ulcer of anus and rectum: Secondary | ICD-10-CM | POA: Insufficient documentation

## 2012-09-28 DIAGNOSIS — Z01818 Encounter for other preprocedural examination: Secondary | ICD-10-CM | POA: Insufficient documentation

## 2012-09-28 DIAGNOSIS — N390 Urinary tract infection, site not specified: Secondary | ICD-10-CM

## 2012-09-28 DIAGNOSIS — Z950 Presence of cardiac pacemaker: Secondary | ICD-10-CM | POA: Insufficient documentation

## 2012-09-28 DIAGNOSIS — Z01812 Encounter for preprocedural laboratory examination: Secondary | ICD-10-CM | POA: Insufficient documentation

## 2012-09-28 DIAGNOSIS — J984 Other disorders of lung: Secondary | ICD-10-CM | POA: Insufficient documentation

## 2012-09-28 DIAGNOSIS — C569 Malignant neoplasm of unspecified ovary: Secondary | ICD-10-CM | POA: Insufficient documentation

## 2012-09-28 HISTORY — DX: Localized edema: R60.0

## 2012-09-28 LAB — COMPREHENSIVE METABOLIC PANEL
ALT: 14 U/L (ref 0–35)
AST: 23 U/L (ref 0–37)
Albumin: 3.7 g/dL (ref 3.5–5.2)
CO2: 26 mEq/L (ref 19–32)
Calcium: 9.1 mg/dL (ref 8.4–10.5)
Chloride: 100 mEq/L (ref 96–112)
GFR calc non Af Amer: 63 mL/min — ABNORMAL LOW (ref >90)
Sodium: 136 mEq/L (ref 135–145)

## 2012-09-28 LAB — CBC WITH DIFFERENTIAL/PLATELET
Basophils Absolute: 0 10*3/uL (ref 0.0–0.1)
Eosinophils Relative: 2 % (ref 0–5)
Lymphocytes Relative: 20 % (ref 12–46)
Neutro Abs: 4.1 10*3/uL (ref 1.7–7.7)
Platelets: 244 10*3/uL (ref 150–400)
RDW: 17.3 % — ABNORMAL HIGH (ref 11.5–15.5)
WBC: 5.8 10*3/uL (ref 4.0–10.5)

## 2012-09-28 LAB — URINALYSIS, ROUTINE W REFLEX MICROSCOPIC
Bilirubin Urine: NEGATIVE
Glucose, UA: NEGATIVE mg/dL
Ketones, ur: NEGATIVE mg/dL
Specific Gravity, Urine: 1.013 (ref 1.005–1.030)
pH: 6 (ref 5.0–8.0)

## 2012-09-28 LAB — URINE MICROSCOPIC-ADD ON

## 2012-09-28 LAB — PROTIME-INR
INR: 2 — ABNORMAL HIGH (ref 0.00–1.49)
Prothrombin Time: 21.9 seconds — ABNORMAL HIGH (ref 11.6–15.2)

## 2012-09-28 LAB — SURGICAL PCR SCREEN
MRSA, PCR: NEGATIVE
Staphylococcus aureus: POSITIVE — AB

## 2012-09-28 MED ORDER — NITROFURANTOIN MONOHYD MACRO 100 MG PO CAPS: 100.0000 mg | ORAL_CAPSULE | Freq: Two times a day (BID) | ORAL | Status: AC

## 2012-09-28 NOTE — Progress Notes (Signed)
Dr Dwain Sarna-   Just double checking- case is posted as colostomy revision, possible take down. Consent order stated debridement of abdominal wounds. If you desire something other than debridement, please enter in EPIC. I'm not trying to overstep my bounds, just double checking  Thank you

## 2012-09-28 NOTE — Telephone Encounter (Signed)
Called to add Flagyl 500mg  IV preop per Dr Dwain Sarna to the surgical orders for surgery on 10-03-12. The nurse was asking about a colon prep and I advised her that Dr Dwain Sarna said no prep for surgery. The pt's family has been notified.

## 2012-09-28 NOTE — Progress Notes (Signed)
Office Visit: GYN ONC  Chief Complaint: Ovarian cancer, Pre op visit   HPI  Victoria [name] is a lovely 79 y.o. 79.She noted a change in bowel habits- intermittent diarrhea- in May 2013. Barium enema on 12/22/2011 showed moderately redundant colon with moderate to severe diverticulosis primarily from the splenic flexure and throughout the sigmoid. She developed rectal bleeding in June 2013. Pelvic examination by her gynecologist was notable for a rectal mass. Patient reports good appetite, abdominal bloating, 16 pound weight loss since Dec 2012.  CT scans of the abdomen and pelvis on 03/02/2012 showed a rectal mass measuring 6.7 cm and 2 adjacent nodal masses in the right mid abdomen measuring 8.9 x 7.4 x 7.2 cm suspicious for nodal metastases and an additional 3.0 x 3.0 x 2.6 cm nodal metastasis posterior to the cecum. A sigmoidoscopy on 03/13/2012 showed a 5 cm malignant appearing mass with central ulceration in the rectum about 2-3 cm proximal to the anal verge. Pathology showed moderate to poorly differentiated adenocarcinoma with ulceration and prolapse changes. Further immuniohistochemistry staining noted malignant cells positive for cytokeratin 7, estrogen receptor, and WT-1. They are negative for cytokeratin 20 and CDX-2. This immunohistochemical profile strongly suggests a gynecologic primary.   CA125 225. The patient underwent exploratory laparotomy small bowel diversion and descending colostomy performed by Dr. Emelia Loron.  She has since had 4 cycles of dose dense  Taxol carboplatin therapy and the CA 125 is now 11.7.  Imaging from 08/07/2012 demonstrates marked response to therapy.  There is near complete resolution of the pelvic masses bilaterally the right-sided lesion now measures only 5.6 x 2.2 cm and the right sided lesion 3.1 x 3.0 cm. Nonspecific edema is noted adjacent to sigmoid colon.  Victoria [name] has done very well with dose dense taxol carboplatin therapy. Her quality of life  concern at this time is the prolapse of the colon at the site of the colostomy. This is the factor that imposes negatively on her quality of life.  She was evaluated by Dr. Joanna Puff whose assessment is that revision is necessary. The plan is for revision of the ostomy prolapse bilateral salpingo-oophorectomy possible omentectomy on 10/03/2012   Past Medical History   Diagnosis  Date   .  CAD (coronary artery disease)    .  Hypertension    .  Atrial fibrillation    .  Hyperlipidemia    .  Rectal carcinoma    .  Colon cancer  03/13/12 egd     rectal mass5cm , 2-3cm proximal to anal verge   .  Diverticulosis    .  Allergy    .  Anxiety      rhematoid   .  Vomiting      started on 03/23/12   .  Obstruction colon  03/24/12     family told obstruction may be outside colon    Past Surgical History   Procedure  Date   .  Pace maker  2010   .  Rib removed  1934     Pleurisy and pneumonia   .  Partial hysterectomy  1972   .  Bladder neck suspension  1986   .  Heart catherization  [id number]   .  Abdominal hysterectomy      both ovaries intact,    ROS ; denies headache cough chest pain hemoptysis. No abdominal pain reports reducible prolapse of her ostomy and discomfort at the hernia site. Denies rectal bleeding, denies vaginal bleeding abdominal  pain nausea vomiting, denies adenopathy or skin changes.  Reports urinary frequency dysuria and discolored appearing urine. The ports edema bilateral lower extremities however Doppler ultrasounds of bilateral lower extremities last week were negative   Physical Exam  WD female in NAD  BP 102/54  Pulse 62  Temp(Src) 98 F (36.7 C) (Oral)  Resp 16  Ht 5' 1.81" (1.57 m)  Wt 134 lb (60.782 kg)  BMI 24.66 kg/m2 HEENT: Normocephalic, NROM  LN; No cervical supraclavicular or inguinal adenopathy  ABDNOMEN: Soft distended no palpable omental cake no tenderness or guarding, prolapsing colostomy approximately 20 cm easily reducible  BACK: No CVA  tenderness  CHEST: Symmetrical chest excursions  HEART: RRR  PELVIC: Normal external genitalia Bartholin's urethra and Skene's glands. No palpable masses within the vagina however in the distal vagina RECTAL: Deferred per patient request EXT: 2+ lower extremity edema  Assessment/Plan: 79 y.o.  year old who initially presented with a large necrotic pelvic mass with rectal erosion, SBO,  Elevated  CA 125 is elevated and immunostains that suggest GYN primary. She subsequently underwent small bowel bypass for management of small bowel obstruction and a colostomy. She's had an excellent response to 4 cycles of dose dense Taxol carboplatin chemotherapy, with marketed reduction in tumor volume. A concurrent problem is the prolapse of her ostomy. The planned procedure is revision of the ostomy, bilateral salpingo-oophorectomy and possible omentectomy. The patient is aware that rectal resection and re\re anastomosis is not a consideration at this time. Procedure is scheduled for 10/03/2012.  At this time her Coumadin has been discontinued and a prescription was given for Macrobid.

## 2012-09-28 NOTE — Patient Instructions (Addendum)
22 Victoria [name]  09/28/2012   Your procedure is scheduled on: 10/03/12  TUESDAY   Report to [facility] at 0530      AM.  Call this number if you have problems the morning of surgery: [phone]       Remember:   Do not eat food after midnight Sunday NIGHT        CLEAR LIQUIDS STARTING Monday MORNING_  DRINK ALL DAY Monday.  NOTHING TO DRINK AFTER MIDNIGHT Monday NIGHT.   Take these medicines the morning of surgery with A SIP OF WATER:  DILTIAZEM, METOPROLOL                                               ZOFRAN IF NEEDED   .  Contacts, dentures or partial plates can not be worn to surgery  Leave suitcase in the car. After surgery it may be brought to your room.  For patients admitted to the hospital, checkout time is 11:00 AM day of  discharge.             SPECIAL INSTRUCTIONS- SEE [place] PREPARING FOR SURGERY INSTRUCTION SHEET-     DO NOT WEAR JEWELRY, LOTIONS, POWDERS, OR PERFUMES.  WOMEN-- DO NOT SHAVE LEGS OR UNDERARMS FOR 12 HOURS BEFORE SHOWERS. MEN MAY SHAVE FACE.  Patients discharged the day of surgery will not be allowed to drive home. IF going home the day of surgery, you must have a driver and someone to stay with you for the first 24 hours  Name and phone number of your driver:       Son Jillyn Hidden                                                                 Please read over the following fact sheets that you were given: MRSA Information, Incentive Spirometry Sheet, Blood Transfusion Sheet  Information                                                                                   [name]  PST 336  [phone]                 FAILURE TO FOLLOW THESE INSTRUCTIONS MAY RESULT IN  CANCELLATION   OF YOUR SURGERY                                                  Patient Signature _____________________________

## 2012-09-28 NOTE — Progress Notes (Signed)
LOV DR Critoriu 09/19/12 with pacemaker interrogation on chart.  Per Dr Duard Brady note 09/26/12  No lovonox bridging needed.  Chest x ray report called to [employer] RN and Harriett Sine also stated they were aware of urinalysis done this am due to frequency and burning with urination.  Intra op pacemaker care orders on chart

## 2012-09-28 NOTE — Patient Instructions (Addendum)
Exploratory laparotomy revision of prolapsed ostomy bilateral salpingo-oophorectomy and omentectomy on 10/03/2012 Discontinue Coumadin Macrobid 100 mg twice a day for 3 days prescribed.  Thank you very much [name] Current for allowing me to provide care for you today.  I appreciate your confidence in choosing our Gynecologic Oncology team.  If you have any questions about your visit today please call our office and we will get back to you as soon as possible.  [name]. Donie Lemelin MD., PhD Gynecologic Oncology

## 2012-09-29 ENCOUNTER — Inpatient Hospital Stay (HOSPITAL_COMMUNITY): Admission: RE | Admit: 2012-09-29 | Payer: Medicare Other | Source: Ambulatory Visit

## 2012-09-30 LAB — URINE CULTURE

## 2012-10-02 NOTE — Anesthesia Preprocedure Evaluation (Addendum)
Anesthesia Evaluation  Patient identified by MRN, date of birth, ID band Patient awake    Reviewed: Allergy & Precautions, H&P , NPO status , Patient's Chart, lab work & pertinent test results, reviewed documented beta blocker date and time   History of Anesthesia Complications (+) PONV  Airway Mallampati: II TM Distance: >3 FB Neck ROM: full    Dental no notable dental hx.    Pulmonary neg pulmonary ROS,  breath sounds clear to auscultation  Pulmonary exam normal       Cardiovascular Exercise Tolerance: Good hypertension, On Medications and On Home Beta Blockers + CAD + dysrhythmias Atrial Fibrillation + pacemaker Rhythm:regular Rate:Normal  St. Jude pacemaker 08/2004.  CXR and ECG reviewed.   Neuro/Psych Anxiety negative neurological ROS     GI/Hepatic negative GI ROS, Neg liver ROS,   Endo/Other  negative endocrine ROS  Renal/GU negative Renal ROS  negative genitourinary   Musculoskeletal   Abdominal   Peds  Hematology negative hematology ROS (+) anemia ,   Anesthesia Other Findings   Reproductive/Obstetrics negative OB ROS                          Anesthesia Physical Anesthesia Plan  ASA: III  Anesthesia Plan: General   Post-op Pain Management:    Induction: Intravenous  Airway Management Planned: Oral ETT  Additional Equipment:   Intra-op Plan:   Post-operative Plan: Extubation in OR  Informed Consent: I have reviewed the patients History and Physical, chart, labs and discussed the procedure including the risks, benefits and alternatives for the proposed anesthesia with the patient or authorized representative who has indicated his/her understanding and acceptance.   Dental Advisory Given  Plan Discussed with: CRNA  Anesthesia Plan Comments:         Anesthesia Quick Evaluation

## 2012-10-02 NOTE — Progress Notes (Signed)
Faxed urine culture report to Damian Leavell, Dr Duard Brady for review through [facility],  Last pacemaker interrogation 2/14 on chart

## 2012-10-02 NOTE — Telephone Encounter (Signed)
M

## 2012-10-03 ENCOUNTER — Encounter (HOSPITAL_COMMUNITY): Payer: Self-pay | Admitting: *Deleted

## 2012-10-03 ENCOUNTER — Encounter (HOSPITAL_COMMUNITY)
Admission: RE | Disposition: A | Discharge status: Home / Self Care | Payer: Self-pay | Source: Ambulatory Visit | Attending: Obstetrics & Gynecology

## 2012-10-03 ENCOUNTER — Inpatient Hospital Stay (HOSPITAL_COMMUNITY)
Admission: RE | Admit: 2012-10-03 | Discharge: 2012-10-09 | DRG: 737 | Disposition: A | Discharge status: Home / Self Care | Payer: Medicare Other | Source: Ambulatory Visit | Attending: General Surgery | Admitting: General Surgery

## 2012-10-03 ENCOUNTER — Inpatient Hospital Stay (HOSPITAL_COMMUNITY): Payer: Medicare Other | Admitting: Anesthesiology

## 2012-10-03 ENCOUNTER — Encounter (HOSPITAL_COMMUNITY): Payer: Self-pay | Admitting: Anesthesiology

## 2012-10-03 DIAGNOSIS — K560 Paralytic ileus: Secondary | ICD-10-CM | POA: Diagnosis not present

## 2012-10-03 DIAGNOSIS — I2510 Atherosclerotic heart disease of native coronary artery without angina pectoris: Secondary | ICD-10-CM | POA: Diagnosis present

## 2012-10-03 DIAGNOSIS — Z950 Presence of cardiac pacemaker: Secondary | ICD-10-CM

## 2012-10-03 DIAGNOSIS — I10 Essential (primary) hypertension: Secondary | ICD-10-CM | POA: Diagnosis present

## 2012-10-03 DIAGNOSIS — E785 Hyperlipidemia, unspecified: Secondary | ICD-10-CM | POA: Diagnosis present

## 2012-10-03 DIAGNOSIS — C569 Malignant neoplasm of unspecified ovary: Secondary | ICD-10-CM

## 2012-10-03 DIAGNOSIS — C785 Secondary malignant neoplasm of large intestine and rectum: Secondary | ICD-10-CM | POA: Diagnosis present

## 2012-10-03 DIAGNOSIS — K660 Peritoneal adhesions (postprocedural) (postinfection): Secondary | ICD-10-CM | POA: Diagnosis present

## 2012-10-03 DIAGNOSIS — F411 Generalized anxiety disorder: Secondary | ICD-10-CM | POA: Diagnosis present

## 2012-10-03 DIAGNOSIS — I4891 Unspecified atrial fibrillation: Secondary | ICD-10-CM | POA: Diagnosis present

## 2012-10-03 DIAGNOSIS — C20 Malignant neoplasm of rectum: Secondary | ICD-10-CM

## 2012-10-03 DIAGNOSIS — K6389 Other specified diseases of intestine: Secondary | ICD-10-CM | POA: Diagnosis present

## 2012-10-03 DIAGNOSIS — IMO0002 Reserved for concepts with insufficient information to code with codable children: Secondary | ICD-10-CM | POA: Diagnosis present

## 2012-10-03 DIAGNOSIS — R1900 Intra-abdominal and pelvic swelling, mass and lump, unspecified site: Secondary | ICD-10-CM

## 2012-10-03 HISTORY — PX: COLOSTOMY REVISION: SHX5232

## 2012-10-03 HISTORY — PX: LAPAROTOMY: SHX154

## 2012-10-03 LAB — TYPE AND SCREEN

## 2012-10-03 LAB — PROTIME-INR: Prothrombin Time: 14.3 seconds (ref 11.6–15.2)

## 2012-10-03 SURGERY — LAPAROTOMY, EXPLORATORY
Anesthesia: General | Wound class: Clean

## 2012-10-03 MED ORDER — CEFAZOLIN SODIUM-DEXTROSE 2-3 GM-% IV SOLR
2.0000 g | INTRAVENOUS | Status: AC
  Administered 2012-10-03: 2 g via INTRAVENOUS

## 2012-10-03 MED ORDER — CISATRACURIUM BESYLATE (PF) 10 MG/5ML IV SOLN
INTRAVENOUS | Status: DC | PRN
  Administered 2012-10-03: 4 mg via INTRAVENOUS
  Administered 2012-10-03 (×2): 2 mg via INTRAVENOUS

## 2012-10-03 MED ORDER — ONDANSETRON HCL 4 MG PO TABS: 4.0000 mg | ORAL_TABLET | Freq: Four times a day (QID) | ORAL | Status: DC | PRN

## 2012-10-03 MED ORDER — HYDROMORPHONE HCL PF 1 MG/ML IJ SOLN: 0.2500 mg | INTRAMUSCULAR | Status: DC | PRN

## 2012-10-03 MED ORDER — METRONIDAZOLE IN NACL 5-0.79 MG/ML-% IV SOLN
500.0000 mg | Freq: Once | INTRAVENOUS | Status: AC
  Administered 2012-10-03: 500 mg via INTRAVENOUS

## 2012-10-03 MED ORDER — FENTANYL CITRATE 0.05 MG/ML IJ SOLN
INTRAMUSCULAR | Status: DC | PRN
  Administered 2012-10-03 (×2): 25 ug via INTRAVENOUS
  Administered 2012-10-03: 50 ug via INTRAVENOUS
  Administered 2012-10-03 (×3): 25 ug via INTRAVENOUS

## 2012-10-03 MED ORDER — NITROFURANTOIN MONOHYD MACRO 100 MG PO CAPS: 100.0000 mg | ORAL_CAPSULE | Freq: Two times a day (BID) | ORAL | Status: DC

## 2012-10-03 MED ORDER — 0.9 % SODIUM CHLORIDE (POUR BTL) OPTIME
TOPICAL | Status: DC | PRN
  Administered 2012-10-03: 2000 mL

## 2012-10-03 MED ORDER — DIPHENHYDRAMINE HCL 50 MG/ML IJ SOLN: 12.5000 mg | Freq: Four times a day (QID) | INTRAMUSCULAR | Status: DC | PRN

## 2012-10-03 MED ORDER — EPHEDRINE SULFATE 50 MG/ML IJ SOLN
INTRAMUSCULAR | Status: DC | PRN
  Administered 2012-10-03: 5 mg via INTRAVENOUS

## 2012-10-03 MED ORDER — AMIODARONE HCL 200 MG PO TABS
200.0000 mg | ORAL_TABLET | Freq: Every day | ORAL | Status: DC
  Administered 2012-10-03 – 2012-10-08 (×6): 200 mg via ORAL
  Filled 2012-10-03 (×7): qty 1

## 2012-10-03 MED ORDER — KCL IN DEXTROSE-NACL 20-5-0.45 MEQ/L-%-% IV SOLN
INTRAVENOUS | Status: DC
  Administered 2012-10-03 (×2): via INTRAVENOUS
  Filled 2012-10-03 (×5): qty 1000

## 2012-10-03 MED ORDER — DILTIAZEM HCL ER COATED BEADS 240 MG PO CP24
240.0000 mg | ORAL_CAPSULE | Freq: Every morning | ORAL | Status: DC
  Administered 2012-10-04 – 2012-10-09 (×6): 240 mg via ORAL
  Filled 2012-10-03 (×6): qty 1

## 2012-10-03 MED ORDER — ONDANSETRON HCL 4 MG/2ML IJ SOLN
INTRAMUSCULAR | Status: DC | PRN
  Administered 2012-10-03: 1 mg via INTRAVENOUS
  Administered 2012-10-03: 4 mg via INTRAVENOUS

## 2012-10-03 MED ORDER — LACTATED RINGERS IV SOLN
INTRAVENOUS | Status: DC | PRN
  Administered 2012-10-03 (×2): via INTRAVENOUS

## 2012-10-03 MED ORDER — SODIUM CHLORIDE 0.9 % IV SOLN: INTRAVENOUS | Status: DC | PRN

## 2012-10-03 MED ORDER — CEFAZOLIN SODIUM-DEXTROSE 2-3 GM-% IV SOLR: 2.0000 g | INTRAVENOUS | Status: DC

## 2012-10-03 MED ORDER — HYDROMORPHONE 0.3 MG/ML IV SOLN
INTRAVENOUS | Status: DC
  Administered 2012-10-03 (×2): 0.2 mg via INTRAVENOUS
  Administered 2012-10-03: 0.3 mg via INTRAVENOUS
  Administered 2012-10-03: 0.99 mg via INTRAVENOUS
  Administered 2012-10-04: 0.2 mg via INTRAVENOUS
  Administered 2012-10-04: 0.3 mg via INTRAVENOUS
  Administered 2012-10-04: 0.199 mg via INTRAVENOUS
  Administered 2012-10-06: 0.3 mg via INTRAVENOUS
  Administered 2012-10-06: 0.2 mg via INTRAVENOUS
  Administered 2012-10-07: 0.3 mg via INTRAVENOUS

## 2012-10-03 MED ORDER — GLYCOPYRROLATE 0.2 MG/ML IJ SOLN
INTRAMUSCULAR | Status: DC | PRN
  Administered 2012-10-03: .2 mg via INTRAVENOUS
  Administered 2012-10-03: 0.2 mg via INTRAVENOUS
  Administered 2012-10-03: .1 mg via INTRAVENOUS

## 2012-10-03 MED ORDER — ZOLPIDEM TARTRATE 5 MG PO TABS: 5.0000 mg | ORAL_TABLET | Freq: Every evening | ORAL | Status: DC | PRN

## 2012-10-03 MED ORDER — METOPROLOL SUCCINATE ER 25 MG PO TB24
25.0000 mg | ORAL_TABLET | Freq: Every morning | ORAL | Status: DC
  Administered 2012-10-04 – 2012-10-09 (×6): 25 mg via ORAL
  Filled 2012-10-03 (×6): qty 1

## 2012-10-03 MED ORDER — ACETAMINOPHEN 10 MG/ML IV SOLN
INTRAVENOUS | Status: DC | PRN
  Administered 2012-10-03: 500 mg via INTRAVENOUS

## 2012-10-03 MED ORDER — PROPOFOL 10 MG/ML IV EMUL
INTRAVENOUS | Status: DC | PRN
  Administered 2012-10-03: 110 mg via INTRAVENOUS

## 2012-10-03 MED ORDER — NEOSTIGMINE METHYLSULFATE 1 MG/ML IJ SOLN
INTRAMUSCULAR | Status: DC | PRN
  Administered 2012-10-03 (×3): 1 mg via INTRAVENOUS

## 2012-10-03 MED ORDER — LACTATED RINGERS IV SOLN
INTRAVENOUS | Status: DC | PRN
  Administered 2012-10-03: 08:00:00 via INTRAVENOUS

## 2012-10-03 MED ORDER — PHENYLEPHRINE HCL 10 MG/ML IJ SOLN
10.0000 mg | INTRAVENOUS | Status: DC | PRN
  Administered 2012-10-03: 50 ug/min via INTRAVENOUS

## 2012-10-03 MED ORDER — ONDANSETRON HCL 4 MG/2ML IJ SOLN
4.0000 mg | Freq: Four times a day (QID) | INTRAMUSCULAR | Status: DC | PRN
  Administered 2012-10-03 – 2012-10-06 (×5): 4 mg via INTRAVENOUS
  Filled 2012-10-03 (×5): qty 2

## 2012-10-03 MED ORDER — ACETAMINOPHEN 10 MG/ML IV SOLN
1000.0000 mg | Freq: Four times a day (QID) | INTRAVENOUS | Status: AC
  Administered 2012-10-03 – 2012-10-04 (×3): 1000 mg via INTRAVENOUS
  Filled 2012-10-03 (×4): qty 100

## 2012-10-03 MED ORDER — SUCCINYLCHOLINE CHLORIDE 20 MG/ML IJ SOLN
INTRAMUSCULAR | Status: DC | PRN
  Administered 2012-10-03: 80 mg via INTRAVENOUS

## 2012-10-03 MED ORDER — OXYCODONE-ACETAMINOPHEN 5-325 MG PO TABS: 1.0000 | ORAL_TABLET | ORAL | Status: DC | PRN

## 2012-10-03 MED ORDER — DIPHENHYDRAMINE HCL 12.5 MG/5ML PO ELIX: 12.5000 mg | ORAL_SOLUTION | Freq: Four times a day (QID) | ORAL | Status: DC | PRN

## 2012-10-03 MED ORDER — NALOXONE HCL 0.4 MG/ML IJ SOLN: 0.4000 mg | INTRAMUSCULAR | Status: DC | PRN

## 2012-10-03 MED ORDER — SODIUM CHLORIDE 0.9 % IJ SOLN: 9.0000 mL | INTRAMUSCULAR | Status: DC | PRN

## 2012-10-03 MED ORDER — LIDOCAINE HCL (CARDIAC) 20 MG/ML IV SOLN
INTRAVENOUS | Status: DC | PRN
  Administered 2012-10-03: 60 mg via INTRAVENOUS

## 2012-10-03 MED ORDER — LACTATED RINGERS IV SOLN
INTRAVENOUS | Status: DC | PRN
  Administered 2012-10-03 (×2): via INTRAVENOUS

## 2012-10-03 MED ORDER — ENOXAPARIN SODIUM 40 MG/0.4ML ~~LOC~~ SOLN
40.0000 mg | SUBCUTANEOUS | Status: DC
  Administered 2012-10-04 – 2012-10-09 (×6): 40 mg via SUBCUTANEOUS
  Filled 2012-10-03 (×6): qty 0.4

## 2012-10-03 MED ORDER — ENOXAPARIN SODIUM 40 MG/0.4ML ~~LOC~~ SOLN
40.0000 mg | SUBCUTANEOUS | Status: AC
  Administered 2012-10-03: 40 mg via SUBCUTANEOUS
  Filled 2012-10-03: qty 0.4

## 2012-10-03 SURGICAL SUPPLY — 66 items
APPLICATOR COTTON TIP 6IN STRL (MISCELLANEOUS) ×4 IMPLANT
ATTRACTOMAT 16X20 MAGNETIC DRP (DRAPES) ×3 IMPLANT
BAG URINE DRAINAGE (UROLOGICAL SUPPLIES) ×2 IMPLANT
BLADE EXTENDED COATED 6.5IN (ELECTRODE) ×3 IMPLANT
BLADE HEX COATED 2.75 (ELECTRODE) ×3 IMPLANT
BLADE SURG SZ10 CARB STEEL (BLADE) ×2 IMPLANT
CANISTER SUCTION 2500CC (MISCELLANEOUS) ×5 IMPLANT
CATH FOLEY 2WAY SLVR  5CC 16FR (CATHETERS)
CATH FOLEY 2WAY SLVR 5CC 16FR (CATHETERS) ×2 IMPLANT
CLIP TI MEDIUM LARGE 6 (CLIP) ×5 IMPLANT
CLOTH BEACON ORANGE TIMEOUT ST (SAFETY) ×5 IMPLANT
COVER MAYO STAND STRL (DRAPES) ×1 IMPLANT
DRAPE LAPAROSCOPIC ABDOMINAL (DRAPES) ×2 IMPLANT
DRAPE TABLE BACK 44X90 PK DISP (DRAPES) ×3 IMPLANT
DRAPE WARM FLUID 44X44 (DRAPE) ×5 IMPLANT
ELECT REM PT RETURN 9FT ADLT (ELECTROSURGICAL) ×3
ELECTRODE REM PT RTRN 9FT ADLT (ELECTROSURGICAL) ×4 IMPLANT
GAUZE SPONGE 4X4 16PLY XRAY LF (GAUZE/BANDAGES/DRESSINGS) IMPLANT
GLOVE BIO SURGEON STRL SZ 6.5 (GLOVE) ×4 IMPLANT
GLOVE BIO SURGEON STRL SZ7.5 (GLOVE) ×5 IMPLANT
GLOVE BIOGEL PI IND STRL 7.0 (GLOVE) ×4 IMPLANT
GLOVE BIOGEL PI INDICATOR 7.0 (GLOVE) ×2
GLOVE ECLIPSE 6.5 STRL STRAW (GLOVE) ×4 IMPLANT
GOWN PREVENTION PLUS XXLARGE (GOWN DISPOSABLE) ×3 IMPLANT
GOWN STRL NON-REIN LRG LVL3 (GOWN DISPOSABLE) IMPLANT
GOWN STRL REIN XL XLG (GOWN DISPOSABLE) IMPLANT
HOLDER FOLEY CATH W/STRAP (MISCELLANEOUS) ×2 IMPLANT
KIT BASIN OR (CUSTOM PROCEDURE TRAY) ×2 IMPLANT
LIGASURE IMPACT 36 18CM CVD LR (INSTRUMENTS) IMPLANT
NDL HYPO 25X1 1.5 SAFETY (NEEDLE) ×2 IMPLANT
NDL SPNL 22GX7 QUINCKE BK (NEEDLE) ×2 IMPLANT
NEEDLE HYPO 25X1 1.5 SAFETY (NEEDLE) IMPLANT
NEEDLE SPNL 22GX7 QUINCKE BK (NEEDLE) IMPLANT
NS IRRIG 1000ML POUR BTL (IV SOLUTION) ×14 IMPLANT
PACK ABDOMINAL WL (CUSTOM PROCEDURE TRAY) ×3 IMPLANT
PACK GENERAL/GYN (CUSTOM PROCEDURE TRAY) ×3 IMPLANT
POUCH OSTOMY 1 3/4  H (OSTOMY) ×2 IMPLANT
RELOAD PROXIMATE 75MM BLUE (ENDOMECHANICALS) ×3 IMPLANT
RELOAD STAPLE 75 3.8 BLU REG (ENDOMECHANICALS) IMPLANT
SHEET LAVH (DRAPES) ×3 IMPLANT
SPONGE GAUZE 4X4 12PLY (GAUZE/BANDAGES/DRESSINGS) ×2 IMPLANT
SPONGE LAP 18X18 X RAY DECT (DISPOSABLE) ×5 IMPLANT
STAPLER PROXIMATE 75MM BLUE (STAPLE) ×1 IMPLANT
STAPLER VISISTAT 35W (STAPLE) ×1 IMPLANT
SUCTION POOLE TIP (SUCTIONS) IMPLANT
SUT NOVA NAB DX-16 0-1 5-0 T12 (SUTURE) ×1 IMPLANT
SUT NURALON 4 0 TR CR/8 (SUTURE) ×1 IMPLANT
SUT PDS AB 1 CTXB1 36 (SUTURE) ×6 IMPLANT
SUT PROLENE 2 0 CT2 30 (SUTURE) ×1 IMPLANT
SUT SILK 2 0 (SUTURE) ×3
SUT SILK 2 0 SH CR/8 (SUTURE) ×3 IMPLANT
SUT SILK 2-0 18XBRD TIE 12 (SUTURE) IMPLANT
SUT SILK 2-0 30XBRD TIE 12 (SUTURE) IMPLANT
SUT SILK 3 0 (SUTURE) ×3
SUT SILK 3 0 SH CR/8 (SUTURE) IMPLANT
SUT SILK 3-0 18XBRD TIE 12 (SUTURE) IMPLANT
SUT VIC AB 0 CT1 36 (SUTURE) ×19 IMPLANT
SUT VIC AB 2-0 CT2 27 (SUTURE) IMPLANT
SUT VIC AB 2-0 SH 18 (SUTURE) ×3 IMPLANT
SUT VIC AB 3-0 SH 18 (SUTURE) ×2 IMPLANT
SUT VICRYL 2 0 18  UND BR (SUTURE) ×1
SUT VICRYL 2 0 18 UND BR (SUTURE) ×2 IMPLANT
SYR CONTROL 10ML LL (SYRINGE) ×4 IMPLANT
TOWEL OR 17X26 10 PK STRL BLUE (TOWEL DISPOSABLE) ×6 IMPLANT
TRAY FOLEY CATH 14FRSI W/METER (CATHETERS) ×1 IMPLANT
YANKAUER SUCT BULB TIP NO VENT (SUCTIONS) IMPLANT

## 2012-10-03 NOTE — Transfer of Care (Signed)
Immediate Anesthesia Transfer of Care Note  Patient: Bryla Darci Current  Procedure(s) Performed: Procedure(s) with comments: EXPLORATORY LAPAROTOMY (Bilateral) - WITH BSO, TUMOR DEBULKING COLOSTOMY REVISION (N/A) - COLOSTOMY REVISION, Sigmoid colectomy  Patient Location: PACU  Anesthesia Type:General  Level of Consciousness: alert  and patient cooperative  Airway & Oxygen Therapy: Patient Spontanous Breathing and Patient connected to face mask oxygen  Post-op Assessment: Report given to PACU RN and Post -op Vital signs reviewed and stable  Post vital signs: stable  Complications: No apparent anesthesia complications

## 2012-10-03 NOTE — Op Note (Signed)
PATIENT: Carrina H Bynum DATE OF BIRTH: [birth year] ENCOUNTER DATE:    Preop Diagnosis: IVB ovarian cancer  Postoperative Diagnosis: same.   Surgery: Exploratory laparotomy, lysis of adhesions, infracolic omentectomy, bilateral salpingo-oophorectomy, resection of ileal nodule, optimal R0 tumor debulking, partial sigmoid colectomy and end colostomy with Dr. Dwain Sarna (please see his dictation)   Surgeons:  Rejeana Brock A. Duard Brady, MD; Antionette Char, MD   Assistant: Telford Nab   Anesthesia: General   Estimated blood loss: 50 ml   IVF: 1000 ml   Urine output: 300 ml   Complications: None   Pathology: Bilateral tubes and ovaries, omentum, ileal nodule  Operative findings: Prolapsed loop colostomy, small incisional hernia, omentum with filmy adhesions to anterior abdominal well.  1 cm nodule at ileal anastamosis, no omental disease. 4 cm right ovarian mass. Normal left adnexa.  No residual disease at the completion of the procedure.  Procedure: The patient was identified in the preoperative holding area. Informed consent was signed on the chart. Patient was seen history was reviewed and exam was performed.   The patient was then taken to the operating room and placed in the supine position with SCD hose on. General anesthesia was then induced without difficulty. She was then placed in the dorsolithotomy position.   Abdomen was prepped in the usual fashion with chlorprep and the stoma was covered with a large tegaderm.  The perineum was prepped and foley catheter was inserted into the bladder under sterile conditions.  After the prep was dry, time out was performed to confirm the patient, procedure, allergy and antibiotic status.  The prior skin scan was ellipsed out with the knife. The peritoneal cavity was entered sharply and the peritoneal incision was extended superiorly and inferiorly with visualization of the underlying peritoneal cavity. The omentum was dissected off the anterior  abdominal wall. The bowel was run from the ileal cecal junction to the ligament of Trietz.  An infracolic omentectomy was performed by creating pedicles that were clamped, transected and suture ligate with 0 vicryl.  The bookwalter retractor was placed.  The small bowel as protected and packed with moist laparotomy sponges.  The posterior leaf of the broad ligament was opened in the right side. The ureter was identified and a window was made between the IP and the ureter. The IP was clamped, transected and suture ligated. A similar procedure was done on the left.  Pedicles were hemostatic. The small 1 cm nodule at her prior ileal anastamosis was removed with sharp dissection. The serosa was closed with a 4.0 neuralon.  At this point Dr. Dwain Sarna entered the room and the remainder of the procedure is dictated under his note.   The patient tolerated the procedure well and was taken to the recovery room in stable condition. This is [employer] dictating an operative note on patient Ndea Darci Current.

## 2012-10-03 NOTE — Progress Notes (Signed)
PACU NOTE: Urine culture sent as ordered.  Dagmar Hait RN notified of no IV pain medication for post-op period.

## 2012-10-03 NOTE — Anesthesia Postprocedure Evaluation (Signed)
  Anesthesia Post-op Note  Patient: Victoria [name] Current  Procedure(s) Performed: Procedure(s) (LRB): EXPLORATORY LAPAROTOMY (Bilateral) COLOSTOMY REVISION (N/A)  Patient Location: PACU  Anesthesia Type: General  Level of Consciousness: awake and alert   Airway and Oxygen Therapy: Patient Spontanous Breathing  Post-op Pain: mild  Post-op Assessment: Post-op Vital signs reviewed, Patient's Cardiovascular Status Stable, Respiratory Function Stable, Patent Airway and No signs of Nausea or vomiting  Last Vitals:  Filed Vitals:   10/03/12 1130  BP: 105/51  Pulse: 60  Temp: 36 C  Resp: 13    Post-op Vital Signs: stable   Complications: No apparent anesthesia complications. Pacemaker not altered. No magnet. No interrogation per cardiology note.

## 2012-10-03 NOTE — Interval H&P Note (Signed)
History and Physical Interval Note:  10/03/2012 7:12 AM  Victoria [name]  has presented today for surgery, with the diagnosis of OVARIAN CANCER AND PROLAPSED COLOSTOMY  The various methods of treatment have been discussed with the patient and family. After consideration of risks, benefits and other options for treatment, the patient has consented to  Procedure(s) with comments: EXPLORATORY LAPAROTOMY (Bilateral) - WITH BSO, TUMOR DEBULKING COLOSTOMY REVISION (N/A) - COLOSTOMY REVISION, POSSIBLE COLOSTOMY TAKEDOWN as a surgical intervention .  The patient's history has been reviewed, patient examined, no change in status, stable for surgery.  I have reviewed the patient's chart and labs.  Questions were answered to the patient's satisfaction.  She understands that she may have residual disease at the completion of the procedure.   GEHRIG,PAOLA A.

## 2012-10-03 NOTE — H&P (View-Only) (Signed)
Office Visit: GYN ONC  Chief Complaint: Ovarian cancer, Pre op visit   HPI  Victoria [name] is a lovely 79 y.o. y.o.She noted a change in bowel habits- intermittent diarrhea- in May 2013. Barium enema on 12/22/2011 showed moderately redundant colon with moderate to severe diverticulosis primarily from the splenic flexure and throughout the sigmoid. She developed rectal bleeding in June 2013. Pelvic examination by her gynecologist was notable for a rectal mass. Patient reports good appetite, abdominal bloating, 16 pound weight loss since Dec 2012.  CT scans of the abdomen and pelvis on 03/02/2012 showed a rectal mass measuring 6.7 cm and 2 adjacent nodal masses in the right mid abdomen measuring 8.9 x 7.4 x 7.2 cm suspicious for nodal metastases and an additional 3.0 x 3.0 x 2.6 cm nodal metastasis posterior to the cecum. A sigmoidoscopy on 03/13/2012 showed a 5 cm malignant appearing mass with central ulceration in the rectum about 2-3 cm proximal to the anal verge. Pathology showed moderate to poorly differentiated adenocarcinoma with ulceration and prolapse changes. Further immuniohistochemistry staining noted malignant cells positive for cytokeratin 7, estrogen receptor, and WT-1. They are negative for cytokeratin 20 and CDX-2. This immunohistochemical profile strongly suggests a gynecologic primary.   CA125 225. The patient underwent exploratory laparotomy small bowel diversion and descending colostomy performed by Dr. Matthew Wakefield.  She has since had 4 cycles of dose dense  Taxol carboplatin therapy and the CA 125 is now 11.7.  Imaging from 08/07/2012 demonstrates marked response to therapy.  There is near complete resolution of the pelvic masses bilaterally the right-sided lesion now measures only 5.6 x 2.2 cm and the right sided lesion 3.1 x 3.0 cm. Nonspecific edema is noted adjacent to sigmoid colon.  Victoria [name] has done very well with dose dense taxol carboplatin therapy. Her quality of life  concern at this time is the prolapse of the colon at the site of the colostomy. This is the factor that imposes negatively on her quality of life.  She was evaluated by Dr. Matthew Field whose assessment is that revision is necessary. The plan is for revision of the ostomy prolapse bilateral salpingo-oophorectomy possible omentectomy on 10/03/2012   Past Medical History   Diagnosis  Date   .  CAD (coronary artery disease)    .  Hypertension    .  Atrial fibrillation    .  Hyperlipidemia    .  Rectal carcinoma    .  Colon cancer  03/13/12 egd     rectal mass5cm , 2-3cm proximal to anal verge   .  Diverticulosis    .  Allergy    .  Anxiety      rhematoid   .  Vomiting      started on 03/23/12   .  Obstruction colon  03/24/12     family told obstruction may be outside colon    Past Surgical History   Procedure  Date   .  Pace maker  2010   .  Rib removed  1934     Pleurisy and pneumonia   .  Partial hysterectomy  1972   .  Bladder neck suspension  1986   .  Heart catherization  2009,2010   .  Abdominal hysterectomy      both ovaries intact,    ROS ; denies headache cough chest pain hemoptysis. No abdominal pain reports reducible prolapse of her ostomy and discomfort at the hernia site. Denies rectal bleeding, denies vaginal bleeding abdominal   pain nausea vomiting, denies adenopathy or skin changes.  Reports urinary frequency dysuria and discolored appearing urine. The ports edema bilateral lower extremities however Doppler ultrasounds of bilateral lower extremities last week were negative   Physical Exam  WD female in NAD  BP 102/54  Pulse 62  Temp(Src) 98 F (36.7 C) (Oral)  Resp 16  Ht 5' 1.81" (1.57 m)  Wt 134 lb (60.782 kg)  BMI 24.66 kg/m2 HEENT: Normocephalic, NROM  LN; No cervical supraclavicular or inguinal adenopathy  ABDNOMEN: Soft distended no palpable omental cake no tenderness or guarding, prolapsing colostomy approximately 20 cm easily reducible  BACK: No CVA  tenderness  CHEST: Symmetrical chest excursions  HEART: RRR  PELVIC: Normal external genitalia Bartholin's urethra and Skene's glands. No palpable masses within the vagina however in the distal vagina RECTAL: Deferred per patient request EXT: 2+ lower extremity edema  Assessment/Plan: 79 y.o.  year old who initially presented with a large necrotic pelvic mass with rectal erosion, SBO,  Elevated  CA 125 is elevated and immunostains that suggest GYN primary. She subsequently underwent small bowel bypass for management of small bowel obstruction and a colostomy. She's had an excellent response to 4 cycles of dose dense Taxol carboplatin chemotherapy, with marketed reduction in tumor volume. A concurrent problem is the prolapse of her ostomy. The planned procedure is revision of the ostomy, bilateral salpingo-oophorectomy and possible omentectomy. The patient is aware that rectal resection and re\re anastomosis is not a consideration at this time. Procedure is scheduled for 10/03/2012.  At this time her Coumadin has been discontinued and a prescription was given for Macrobid. 

## 2012-10-03 NOTE — Op Note (Signed)
Preoperative diagnosis: Ovarian cancer, prolapsed colostomy Postoperative diagnosis: same as above Procedure:  1. Sigmoid colectomy 2. Revision of colostomy to end colostomy and Hartman's pouch Surgeon: Dr. Dwain Sarna Asst.: Dr Cleda Mccreedy Anesthesia: Gen. Endotracheal Estimated blood loss: Minimal Drains: None Specimens: #1 sigmoid colon and colostomy Complications: None Sponge count correct at end of operation Disposition to recovery in stable condition  Indications: This is an 79 year old female I know well from a prior operation. Previously she was diagnosed with ovarian cancer that was invading into her rectum and given her a small bowel obstruction. I taken her to the operating room for laparotomy. I ended up bypassing her small bowel from the site of the obstruction as well as giving her a loop colostomy. She was then administered chemotherapy was done where he went very well from that. The only issue she has currently has is that she has prolapsed the proximal end of the colostomy. We discussed all of her different options after reviewing her CT scan. I think that the best option is to do this combined with gynecologic oncology and revise her colostomy.  Procedure: After informed consent was obtained the patient was taken to the operating room. She was administered cefazolin and Flagyl. She had sequential compression devices. Dr. Duard Brady began her portion of the operation I came in later. A surgical timeout was done for my portion of the operation.  She had her tumor completely resected at this point. The colon was very loose at this point. I think what the response to her tumor that this became very mobile where it was not before. I elected to excise the skin and put her ostomy back into her peritoneal cavity. I then used the GIA stapler to divide the colon at the top of the rectum. I then divided this with the GIA stapler just proximal to the ostomy. I then used Kelly clamps and 2-0 silk  ties to come across the mesentery. I then passed this off the table as a specimen. Following this I then pulled the end colostomy up through my old site. There was no evidence of a peristomal hernia. I then used 2-0 Prolene to close the fascia to decrease the size of the hole and I used a 2-0 Vicryl right next to where the stoma would be. Following this we irrigated copiously. We then closed the abdomen with #1 PDS. I did use a couple of #1 Novafils at the site where there was a small incisional hernia when the incision was made. We then irrigated the wound and stapled this. I then closed the portion of the skin around the stoma with 3-0 Vicryl and staples. I then matured the stoma with 3-0 Vicryl sutures. This was patent and pink upon completion. A sterile dressing and a colostomy bag were then applied. She tolerated this well was extubated and transferred to the recovery room in stable condition.

## 2012-10-04 ENCOUNTER — Encounter (HOSPITAL_COMMUNITY): Payer: Self-pay | Admitting: Gynecologic Oncology

## 2012-10-04 LAB — CBC
HCT: 29.5 % — ABNORMAL LOW (ref 36.0–46.0)
MCH: 32.1 pg (ref 26.0–34.0)
MCHC: 32.2 g/dL (ref 30.0–36.0)
MCV: 99.7 fL (ref 78.0–100.0)
RDW: 16.5 % — ABNORMAL HIGH (ref 11.5–15.5)

## 2012-10-04 LAB — BASIC METABOLIC PANEL
BUN: 9 mg/dL (ref 6–23)
Chloride: 97 mEq/L (ref 96–112)
Creatinine, Ser: 0.8 mg/dL (ref 0.50–1.10)
GFR calc Af Amer: 77 mL/min — ABNORMAL LOW (ref >90)
Glucose, Bld: 152 mg/dL — ABNORMAL HIGH (ref 70–99)

## 2012-10-04 LAB — URINE CULTURE
Colony Count: NO GROWTH
Culture: NO GROWTH
Special Requests: NORMAL

## 2012-10-04 MED ORDER — KCL IN DEXTROSE-NACL 20-5-0.9 MEQ/L-%-% IV SOLN
INTRAVENOUS | Status: DC
  Administered 2012-10-04: 14:00:00 via INTRAVENOUS
  Administered 2012-10-05: 100 mL/h via INTRAVENOUS
  Administered 2012-10-05 – 2012-10-08 (×5): via INTRAVENOUS
  Filled 2012-10-04 (×10): qty 1000

## 2012-10-04 NOTE — Consult Note (Addendum)
WOC ostomy consult  Stoma type/location: Pt with colostomy revision to left lower quad on 3/11.  First day post-op. Pouch change requested on Thursday by Dr Dwain Sarna.  Pt states she is familiar with pouching routines from previous colostomy she "had a terrible time with" until revision yesterday.  She was able to apply and empty pouch independently but states it leaked frequently, she "had lots of problems getting it to stay on" and her stoma "bulged and hung down." She has been using 2 piece pouching system prior to admission and would like to continue. Stomal assessment/size: Stoma appears to be flush with skin level, red and viable through pouch which is intact with good seal.  Output No stool or flatus at present Ostomy pouching: 1pc Kayara pouch intact from surgery Education provided: Plan for pouch change tomorrow by member of the [facility] team as requested to discuss revision of pouching system if necessary.  Supplies at bedside for staff nurse. Cammie Mcgee MSN, RN, CWOCN, [place], CNS [phone]

## 2012-10-04 NOTE — Evaluation (Signed)
Physical Therapy Evaluation Patient Details Name: Victoria [name] MRN: [id number] DOB: 07/16/1927 Today's Date: 10/04/2012 Time: 4098-1191 PT Time Calculation (min): 18 min  PT Assessment / Plan / Recommendation Clinical Impression  Pt is 79 yo female admitted 10/03/12 for prolapsed colostomy, ovarian cancer. Pt. is s/p expl. lap,lysis of adhesions, partial colectomy, rvision colostomy on 3/11. Pt. is tolerating ambulation very well. pt. will benefit from PT while in acute care to improve independence.     PT Assessment  Patient needs continued PT services    Follow Up Recommendations  No PT follow up    Does the patient have the potential to tolerate intense rehabilitation      Barriers to Discharge        Equipment Recommendations  None recommended by PT    Recommendations for Other Services     Frequency Min 3X/week    Precautions / Restrictions Precautions Precaution Comments: monitor O2. dropped on RA.   Pertinent Vitals/Pain *sats drop into mid 80's on RA while ambulating.. Encouraged pursed lip breath's. sats  u to 95 2 l.     Mobility  Bed Mobility Bed Mobility: Supine to Sit Supine to Sit: 5: Supervision;With rails Transfers Transfers: Sit to Stand;Stand to Sit Sit to Stand: 4: Min assist;From bed;With upper extremity assist Stand to Sit: To bed;To chair/3-in-1;With upper extremity assist;5: Supervision Details for Transfer Assistance: pt did have some difficulty from low seat. Ambulation/Gait Ambulation/Gait Assistance: 1: +2 Total assist (1 for equipment) Ambulation/Gait: Patient Percentage: 90% Ambulation Distance (Feet): 150 Feet Assistive device: Straight cane Ambulation/Gait Assistance Details: pt tends to list to R Gait Pattern: Step-through pattern;Trunk flexed;Lateral trunk lean to right    Exercises     PT Diagnosis: Generalized weakness;Acute pain  PT Problem List: Decreased strength;Decreased activity tolerance;Decreased mobility;Decreased  knowledge of use of DME;Decreased knowledge of precautions;Decreased safety awareness PT Treatment Interventions: DME instruction;Gait training;Stair training;Functional mobility training;Therapeutic activities;Therapeutic exercise;Patient/family education   PT Goals Acute Rehab PT Goals PT Goal Formulation: With patient/family Time For Goal Achievement: 10/18/12 Potential to Achieve Goals: Good Pt will go Sit to Stand: with modified independence PT Goal: Sit to Stand - Progress: Goal set today Pt will go Stand to Sit: with modified independence PT Goal: Stand to Sit - Progress: Goal set today Pt will Ambulate: >150 feet;with supervision;with cane PT Goal: Ambulate - Progress: Goal set today Pt will Go Up / Down Stairs: 3-5 stairs;with supervision;with least restrictive assistive device PT Goal: Up/Down Stairs - Progress: Goal set today Pt will Perform Home Exercise Program: Independently PT Goal: Perform Home Exercise Program - Progress: Goal set today  Visit Information  Last PT Received On: 10/04/12 Assistance Needed: +2 (equipment- try O2)    Subjective Data  Subjective: I am sore. I have walked Patient Stated Goal: to go home   Prior Functioning  Home Living Lives With: Spouse Available Help at Discharge: Family Type of Home: House Home Access: Stairs to enter Secretary/administrator of Steps: 4 Entrance Stairs-Rails: Left Home Layout: One level Bathroom Shower/Tub: Health visitor: Handicapped height Home Adaptive Equipment: Straight cane Prior Function Level of Independence: Independent with assistive device(s) Able to Take Stairs?: Yes Driving: Yes Communication Communication: No difficulties    Cognition  Cognition Overall Cognitive Status: Appears within functional limits for tasks assessed/performed Arousal/Alertness: Awake/alert Orientation Level: Appears intact for tasks assessed Behavior During Session: [facility] for tasks performed     Extremity/Trunk Assessment Right Upper Extremity Assessment RUE ROM/Strength/Tone: [facility] for tasks assessed Left  Upper Extremity Assessment LUE ROM/Strength/Tone: WFL for tasks assessed Right Lower Extremity Assessment RLE ROM/Strength/Tone: [facility] for tasks assessed Left Lower Extremity Assessment LLE ROM/Strength/Tone: [facility] for tasks assessed Trunk Assessment Trunk Assessment: Kyphotic   Balance    End of Session PT - End of Session Patient left: in chair;with call bell/phone within reach;with family/visitor present Nurse Communication: Mobility status  GP     Rada Hay 10/04/2012, 4:44 PM  [phone]

## 2012-10-04 NOTE — Care Management Note (Signed)
    Page 1 of 1   10/04/2012     11:00:22 AM   CARE MANAGEMENT NOTE 10/04/2012  Patient:  Victoria [name], Victoria [name]   Account Number:  [id number]  Date Initiated:  10/04/2012  Documentation initiated by:  Lorenda Ishihara  Subjective/Objective Assessment:   79 yo female admitted s/p explor. lap with bowel resection and colostomy revision. PTA lived at home with family.     Action/Plan:   Home when stable   Anticipated DC Date:  10/07/2012   Anticipated DC Plan:  HOME/SELF CARE      DC Planning Services  CM consult      Choice offered to / List presented to:             Status of service:  Completed, signed off Medicare Important Message given?   (If response is "NO", the following Medicare IM given date fields will be blank) Date Medicare IM given:   Date Additional Medicare IM given:    Discharge Disposition:  HOME/SELF CARE  Per UR Regulation:  Reviewed for med. necessity/level of care/duration of stay  If discussed at Long Length of Stay Meetings, dates discussed:    Comments:

## 2012-10-04 NOTE — Progress Notes (Signed)
1 Day Post-Op Procedure(s) (LRB): EXPLORATORY LAPAROTOMY (Bilateral) COLOSTOMY REVISION (N/A)  Subjective: Patient reports tolerating sips of clear liquids this am.  Reporting an episode of nausea yesterday afternoon.  Adequate pain relief reported.  Denies chest pain, dyspnea, nausea, or vomiting at this time.   Objective: Vital signs in last 24 hours: Temp:  [96.2 F (35.7 C)-98.9 F (37.2 C)] 98.9 F (37.2 C) (03/12 0559) Pulse Rate:  [57-68] 68 (03/12 0559) Resp:  [12-19] 16 (03/12 0745) BP: (100-119)/(48-75) 112/75 mmHg (03/12 0559) SpO2:  [93 %-100 %] 97 % (03/12 0745) FiO2 (%):  [30 %-34 %] 30 % (03/12 0426) Weight:  [134 lb (60.782 kg)] 134 lb (60.782 kg) (03/11 1203) Last BM Date: 10/03/12  Intake/Output from previous day: 03/11 0701 - 03/12 0700 In: 4076.3 [P.O.:720; I.V.:3356.3] Out: 1930 [Urine:1930]  Physical Examination: General: alert, cooperative and no distress Resp: clear to auscultation bilaterally Cardio: regular rate and rhythm, S1, S2 normal, no murmur, click, rub or gallop GI: incision: incision with dressing clean, dry, and intact and abdomen mildy distended, faint bowel sounds, abdomen soft, colostomy to the LLQ intact with stoma pink Extremities: extremities normal, atraumatic, no cyanosis or edema  Labs: WBC/Hgb/Hct/Plts:  8.6/9.5/29.5/221 (03/12 1914) BUN/Cr/glu/ALT/AST/amyl/lip:  9/0.80/--/--/--/--/-- (03/12 7829)  Assessment: 79 y.o. s/p Procedure(s): EXPLORATORY LAPAROTOMY COLOSTOMY REVISION: stable Pain:  Pain is well-controlled on PCA.  Heme:  Hgb 9.5 and Hct 29.5 post-operatively.  Continue to monitor.  CV: History of HTN, CAD, A fib.  Current treatment:  Amiodarone, Diltiazem, Metoprolol   GI:  Tolerating po: Yes.  Diet advancement per Dr. Dwain Sarna.     FEN:  Stable post-operatively, Na 130 this am.  Continue to monitor.  Prophylaxis: intermittent pneumatic compression boots.  Plan: Encourage ambulation Decrease IVF to 50  cc/hr Encourage IS use, deep breathing, and coughing Continue post-operative plan of care   LOS: 1 day    CROSS, MELISSA DEAL 10/04/2012, 9:21 AM

## 2012-10-04 NOTE — Progress Notes (Signed)
1 Day Post-Op  Subjective: Some nausea overnight, otherwise feeling well  Objective: Vital signs in last 24 hours: Temp:  [96.2 F (35.7 C)-98.9 F (37.2 C)] 98.9 F (37.2 C) (03/12 0559) Pulse Rate:  [57-68] 68 (03/12 0559) Resp:  [12-19] 16 (03/12 0745) BP: (100-119)/(48-75) 112/75 mmHg (03/12 0559) SpO2:  [93 %-100 %] 97 % (03/12 0745) FiO2 (%):  [30 %-34 %] 30 % (03/12 0426) Weight:  [134 lb (60.782 kg)] 134 lb (60.782 kg) (03/11 1203) Last BM Date: 10/03/12  Intake/Output from previous day: 03/11 0701 - 03/12 0700 In: 4076.3 [P.O.:720; I.V.:3356.3] Out: 1930 [Urine:1930] Intake/Output this shift:    General appearance: no distress Resp: clear to auscultation bilaterally Cardio: regular rate and rhythm GI: approp tender wound with mild drainage, few bs, ostomy with some bloody output as expected but pink underneath  Lab Results:   Recent Labs  10/04/12 0412  WBC 8.6  HGB 9.5*  HCT 29.5*  PLT 221   BMET  Recent Labs  10/04/12 0412  NA 130*  K 4.5  CL 97  CO2 26  GLUCOSE 152*  BUN 9  CREATININE 0.80  CALCIUM 8.2*   PT/INR  Recent Labs  10/03/12 0555  LABPROT 14.3  INR 1.13   Anti-infectives: Anti-infectives   Start     Dose/Rate Route Frequency Ordered Stop   10/03/12 0845  metroNIDAZOLE (FLAGYL) IVPB 500 mg     500 mg 100 mL/hr over 60 Minutes Intravenous  Once 10/03/12 0839 10/03/12 0839   10/03/12 0544  ceFAZolin (ANCEF) IVPB 2 g/50 mL premix     2 g 100 mL/hr over 30 Minutes Intravenous On call to O.R. 10/03/12 0544 10/03/12 0737   10/03/12 0544  ceFAZolin (ANCEF) IVPB 2 g/50 mL premix  Status:  Discontinued     2 g 100 mL/hr over 30 Minutes Intravenous On call to O.R. 10/03/12 0544 10/03/12 0546   10/03/12 0544  ceFAZolin (ANCEF) IVPB 2 g/50 mL premix  Status:  Discontinued     2 g 100 mL/hr over 30 Minutes Intravenous On call to O.R. 10/03/12 0544 10/03/12 0546      Assessment/Plan: POD 1 colostomy revision 1. Will leave at  clears today due to nausea 2. Change dressing tomorrow, will consult ostomy care 3. May leave on home meds and lovenox from my standpoint 4. Pt consult put in by me   [facility] 10/04/2012

## 2012-10-05 LAB — COMPREHENSIVE METABOLIC PANEL
Albumin: 2.8 g/dL — ABNORMAL LOW (ref 3.5–5.2)
Alkaline Phosphatase: 79 U/L (ref 39–117)
BUN: 6 mg/dL (ref 6–23)
Chloride: 95 mEq/L — ABNORMAL LOW (ref 96–112)
Creatinine, Ser: 0.67 mg/dL (ref 0.50–1.10)
GFR calc Af Amer: >90 mL/min (ref >90)
Glucose, Bld: 123 mg/dL — ABNORMAL HIGH (ref 70–99)
Total Bilirubin: 0.4 mg/dL (ref 0.3–1.2)

## 2012-10-05 LAB — BASIC METABOLIC PANEL
Calcium: 8.4 mg/dL (ref 8.4–10.5)
GFR calc Af Amer: >90 mL/min (ref >90)
GFR calc non Af Amer: 81 mL/min — ABNORMAL LOW (ref >90)
Glucose, Bld: 139 mg/dL — ABNORMAL HIGH (ref 70–99)
Potassium: 3.8 mEq/L (ref 3.5–5.1)
Sodium: 128 mEq/L — ABNORMAL LOW (ref 135–145)

## 2012-10-05 NOTE — Progress Notes (Signed)
2 Days Post-Op Procedure(s) (LRB): EXPLORATORY LAPAROTOMY (Bilateral) COLOSTOMY REVISION (N/A)  Subjective: Patient reports nausea yesterday afternoon and waking up this am "not feeling too good."  Adequate pain relief reported.  Able to tolerate sips of clears.  Ambulating with assistance and voiding without difficulty.  Denies chest pain, dyspnea, nausea, or vomiting at this time.   Objective: Vital signs in last 24 hours: Temp:  [97.7 F (36.5 C)-98.1 F (36.7 C)] 97.9 F (36.6 C) (03/13 0555) Pulse Rate:  [64-68] 64 (03/13 0555) Resp:  [18-22] 18 (03/13 0800) BP: (112-130)/(67-76) 121/67 mmHg (03/13 0555) SpO2:  [68 %-95 %] 95 % (03/13 0800) Last BM Date: 10/03/12  Intake/Output from previous day: 03/12 0701 - 03/13 0700 In: 2870.2 [P.O.:1090; I.V.:1780.2] Out: 2425 [Urine:2425]  Physical Examination: General: alert, cooperative and no distress Resp: clear to auscultation bilaterally Cardio: regular rate and rhythm, S1, S2 normal, no murmur, click, rub or gallop GI: incision: incision with dressing clean, dry, and intact and abdomen more distended from 10/04/12 assessment, hypoactive bowel sounds, abdomen soft, colostomy to the LLQ intact with stoma pink and no output in the bag Extremities: extremities normal, atraumatic, no cyanosis or edema  Labs:   BUN/Cr/glu/ALT/AST/amyl/lip:  6/0.67/--/8/14/--/-- (03/13 0830)  Assessment: 79 y.o. s/p Procedure(s): EXPLORATORY LAPAROTOMY COLOSTOMY REVISION: stable Pain:  Pain is well-controlled on PCA.  Heme:  Hgb 9.5 and Hct 29.5 post-operatively.  Continue to monitor.  CV: History of HTN, CAD, A fib.  Current treatment:  Amiodarone, Diltiazem, Metoprolol   GI:  Tolerating po: Yes.  Maintain clear liquids with diet advancement per Dr. Dwain Sarna.     FEN:  Stable post-operatively, Na 127 this am.  Continue to monitor.  Prophylaxis: intermittent pneumatic compression boots.  Plan: Encourage ambulation Increase IVF to 100  cc/hr per Dr. Tamela Oddi due to decreased PO intake Wound/ostomy RN to see the patient Encourage IS use, deep breathing, and coughing Continue post-operative plan of care   LOS: 2 days    CROSS, Kameren Baade DEAL 10/05/2012, 9:32 AM

## 2012-10-05 NOTE — Consult Note (Signed)
WOC ostomy consult  Stoma type/location: LUQ, end colostomy Stomal assessment/size: 1 1/8" round, flush with the skin Peristomal assessment: has staples horizontally from stoma to midline incision where previous stoma was located, may make pouching more challenging until the staples are removed. I discussed this with the patient, will need to adjust pouching schedule until this area is healed. Treatment options for stomal/peristomal skin: added 2" barrier ring to back of the stoma due to flushness of stoma to create gentle convexity Output minimal brown liquid with flatus in pouch and during pouch change Ostomy pouching: 2pc. I used 2pc today per pts request, she has used this in the past with success, now that stoma not as budded from the skin may need flexibility of 1pc, will have to assess as we go.  Education provided: pt familiar with ostomy pouching and lock and roll closure.   WOC team will follow along with you for support and education as needed. Melody Kaycee RN,CWOCN [phone]

## 2012-10-05 NOTE — Progress Notes (Signed)
2 Days Post-Op  Subjective: Some nausea, feels more bloated, ambulated with pt, urinating, no emesis  Objective: Vital signs in last 24 hours: Temp:  [97.7 F (36.5 C)-98.1 F (36.7 C)] 97.9 F (36.6 C) (03/13 0555) Pulse Rate:  [64-68] 64 (03/13 0555) Resp:  [16-22] 20 (03/13 0555) BP: (112-130)/(67-76) 121/67 mmHg (03/13 0555) SpO2:  [68 %-97 %] 92 % (03/13 0555) Last BM Date: 10/03/12  Intake/Output from previous day: 03/12 0701 - 03/13 0700 In: 2870.2 [P.O.:1090; I.V.:1780.2] Out: 2425 [Urine:2425] Intake/Output this shift:    General appearance: no distress Resp: diminished breath sounds bibasilar Cardio: regular rate and rhythm GI: moderate distension, stoma without output, wound looks clean, approp tender, very few bs  Lab Results:   Recent Labs  10/04/12 0412  WBC 8.6  HGB 9.5*  HCT 29.5*  PLT 221   BMET  Recent Labs  10/04/12 0412  NA 130*  K 4.5  CL 97  CO2 26  GLUCOSE 152*  BUN 9  CREATININE 0.80  CALCIUM 8.2*    Assessment/Plan: POD 2 colostomy revision, gyn onc 1. Dc percs, will leave on iv pain meds 2. She has ileus and will restrict her clears now, if worse she may end up needing films and ng tube but hopefully will get better, if worse will also need meds switched to iv 3. pulm toilet   [facility] 10/05/2012

## 2012-10-06 ENCOUNTER — Inpatient Hospital Stay (HOSPITAL_COMMUNITY): Payer: Medicare Other

## 2012-10-06 LAB — BASIC METABOLIC PANEL
BUN: 5 mg/dL — ABNORMAL LOW (ref 6–23)
Calcium: 8.5 mg/dL (ref 8.4–10.5)
Creatinine, Ser: 0.54 mg/dL (ref 0.50–1.10)
GFR calc Af Amer: >90 mL/min (ref >90)
GFR calc non Af Amer: 85 mL/min — ABNORMAL LOW (ref >90)
Glucose, Bld: 149 mg/dL — ABNORMAL HIGH (ref 70–99)
Potassium: 3.5 mEq/L (ref 3.5–5.1)

## 2012-10-06 LAB — NA AND K (SODIUM & POTASSIUM), RAND UR
Potassium Urine: 69 mEq/L
Sodium, Ur: 72 mEq/L

## 2012-10-06 LAB — OSMOLALITY, URINE: Osmolality, Ur: 477 mOsm/kg (ref 390–1090)

## 2012-10-06 MED ORDER — WARFARIN SODIUM 5 MG PO TABS
5.0000 mg | ORAL_TABLET | Freq: Once | ORAL | Status: AC
  Administered 2012-10-06: 5 mg via ORAL
  Filled 2012-10-06: qty 1

## 2012-10-06 MED ORDER — WARFARIN - PHARMACIST DOSING INPATIENT: Freq: Every day | Status: DC

## 2012-10-06 MED ORDER — POTASSIUM CHLORIDE 10 MEQ/100ML IV SOLN
10.0000 meq | INTRAVENOUS | Status: AC
  Administered 2012-10-06 (×4): 10 meq via INTRAVENOUS
  Filled 2012-10-06 (×4): qty 100

## 2012-10-06 NOTE — Progress Notes (Signed)
3 Days Post-Op Procedure(s) (LRB): EXPLORATORY LAPAROTOMY (Bilateral) COLOSTOMY REVISION (N/A)  Subjective: Patient reports nausea between 3 and 4 am this morning.  No nausea reported at this time.  Adequate pain relief reported.  Tolerating ice chips.  Ambulating with assistance and voiding without difficulty.  Denies chest pain, dyspnea, nausea, or vomiting at this time.   Objective: Vital signs in last 24 hours: Temp:  [97.7 F (36.5 C)-98.3 F (36.8 C)] 98.3 F (36.8 C) (03/14 0606) Pulse Rate:  [67-85] 85 (03/14 0606) Resp:  [16-26] 16 (03/14 0743) BP: (133-160)/(68-77) 135/77 mmHg (03/14 0606) SpO2:  [91 %-95 %] 95 % (03/14 0743) Last BM Date: 10/03/12  Intake/Output from previous day: 03/13 0701 - 03/14 0700 In: 3515 [P.O.:1260; I.V.:2255] Out: 1950 [Urine:1950]  Physical Examination: General: alert, cooperative and no distress Resp: clear to auscultation bilaterally Cardio: regular rate and rhythm, S1, S2 normal, no murmur, click, rub or gallop GI: incision: incision with dressing clean, dry, and intact and abdomen distended, active bowel sounds, abdomen soft, tympanic on percussion, colostomy to the LLQ intact with stoma pink and brown liquid output in the bag Extremities: extremities normal, atraumatic, no cyanosis or edema  Labs:   BUN/Cr/glu/ALT/AST/amyl/lip:  5/0.54/--/--/--/--/-- (03/14 1610)  Assessment: 79 y.o. s/p Procedure(s): EXPLORATORY LAPAROTOMY COLOSTOMY REVISION: stable Pain:  Pain is well-controlled on PCA.  Heme:  Hgb 9.5 and Hct 29.5 post-operatively on 10/13/12.    CV: History of HTN, CAD, A fib.  Current treatment:  Amiodarone, Diltiazem, Metoprolol   GI:  Tolerating po: Yes, ice chips.  Diet advancement per Dr. Dwain Sarna.     FEN:  Stable post-operatively, Na 131 this am.  Continue to monitor.  Prophylaxis: intermittent pneumatic compression boots and Lovenox.  Plan: Change attending to Dr. Dwain Sarna Encourage ambulation Coumadin  consult per Dr. Tamela Oddi Appreciate Wound/ostomy consult Encourage IS use, deep breathing, and coughing Continue post-operative plan of care per Dr. Dwain Sarna   LOS: 3 days    CROSS, MELISSA DEAL 10/06/2012, 11:10 AM

## 2012-10-06 NOTE — Progress Notes (Signed)
ANTICOAGULATION CONSULT NOTE - Initial Consult  Pharmacy Consult for Warfarin Indication: Afib  Allergies  Allergen Reactions  . Pindolol Swelling    Throat swelled up  . Iodine Other (See Comments)    BLISTERS  . Shellfish Allergy   . Vibramycin (Doxycycline Calcium) Nausea Only  . Doxycycline Nausea Only  . Levofloxacin Other (See Comments)    DIZZINESS  . Multaq (Dronedarone)     CAUSED SEVERE BURNING GI TRACT    Patient Measurements: Height: 5\' 2"  (157.5 cm) Weight: 134 lb (60.782 kg) IBW/kg (Calculated) : 50.1  Vital Signs: Temp: 98.3 F (36.8 C) (03/14 0606) Temp src: Oral (03/14 0606) BP: 135/77 mmHg (03/14 0606) Pulse Rate: 85 (03/14 0606)  Labs:  Recent Labs  10/04/12 0412 10/05/12 0830 10/05/12 1540 10/06/12 0437  HGB 9.5*  --   --   --   HCT 29.5*  --   --   --   PLT 221  --   --   --   CREATININE 0.80 0.67 0.63 0.54    Estimated Creatinine Clearance: 45.8 ml/min (by C-G formula based on Cr of 0.54).   Medical History: Past Medical History  Diagnosis Date  . Atrial fibrillation   . Hyperlipidemia   . Rectal carcinoma   . Colon cancer 03/13/12 egd    rectal mass5cm  , 2-3cm proximal to anal verge  . Diverticulosis   . Allergy   . Anxiety     rhematoid  . Pacemaker   . Coronary artery disease   . Colostomy in place   . PONV (postoperative nausea and vomiting)   . Chronic anticoagulation   . Fluid collection (edema) in the arms, legs, hands and feet   . Urinary frequency     with burning x 2 days  . History of blood transfusion   . Hypertension     EKG 10/13 chart    Anticaog or Interacting Medications:  Amiodarone 200mg  PO QHS (possible increase in INR) Lovenox 40mg  SQ q24h  Assessment: 79 yo F on chronic warfarin for hx of AFib. Home dose reported as 5mg  daily. Pt stopped warfarin prior to procedure (last dose 3/5). Hx of ovarian cancer and prolapsed colostomy underwent sigmoid colectomy and revision of colostomy 3/11. Order now  to resume warfarin per Rx. INR = 1.13 on 3/11 (INR was 2.0 on 3/6). Has been on Lovenox 40mg  SQ q24h post-op.  Goal of Therapy:  INR 2-3 Monitor platelets by anticoagulation protocol: Yes   Plan:  1) Warfarin 5mg  PO x1 at 18:00 2) Daily INR  Darrol Angel, PharmD Pager: [phone] 10/06/2012,10:25 AM

## 2012-10-06 NOTE — Progress Notes (Signed)
Physical Therapy Treatment Patient Details Name: Victoria [name] MRN: [id number] DOB: [birth year] Today's Date: 10/06/2012 Time: 7829-5621 PT Time Calculation (min): 24 min  PT Assessment / Plan / Recommendation Comments on Treatment Session  Pt stated she was feeling better, slowly increasing diet now on clear liquids.  Vey motivated.  Assisted pt OOB to amb in hallway on RA (see below).  Pt plans to D/C to home.    Follow Up Recommendations  No PT follow up     Does the patient have the potential to tolerate intense rehabilitation     Barriers to Discharge        Equipment Recommendations  None recommended by PT    Recommendations for Other Services    Frequency Min 3X/week   Plan      Precautions / Restrictions Precautions Precaution Comments: monitor O2. dropped on RA. Restrictions Weight Bearing Restrictions: No   Pertinent Vitals/Pain No c/o pain    Mobility  Bed Mobility Bed Mobility: Supine to Sit Supine to Sit: 5: Supervision;With rails Details for Bed Mobility Assistance: increased time Transfers Transfers: Sit to Stand;Stand to Sit Sit to Stand: 4: Min guard;4: Min assist;From bed Stand to Sit: 4: Min guard;4: Min assist Details for Transfer Assistance: 25% VC's safety with turns and hand placement with stand to sit Ambulation/Gait Ambulation/Gait Assistance: 4: Min assist Ambulation Distance (Feet): 300 Feet (150" x 2 one sitting rest break) Assistive device: Straight cane Ambulation/Gait Assistance Details: Pt has a quick gait.  Good alternating sequencing and proper use of straight cane.  O2 sats 2 lts at rest 96%.  Sats RA at rest 95%.  Sats RA with amb dropped to 85%.  Reapllied 2 lts increased to 95%.  HR avg 87. Gait Pattern: Step-through pattern Gait velocity: quick gait     PT Goals                                progressing    Visit Information  Last PT Received On: 10/06/12 Assistance Needed: +1    Subjective Data      Cognition  good   Balance   good with cane  End of Session PT - End of Session Equipment Utilized During Treatment: Gait belt;Oxygen Activity Tolerance: Patient limited by fatigue Patient left: in chair;with call bell/phone within reach;with family/visitor present   Felecia Shelling  PTA [facility]  Acute  Rehab Pager      [phone]

## 2012-10-06 NOTE — Progress Notes (Signed)
3 Days Post-Op  Subjective: Some nausea still but a little flatus and brown liquid in bag, ambulated some, voiding well, pain controlled  Objective: Vital signs in last 24 hours: Temp:  [97.4 F (36.3 C)-98.3 F (36.8 C)] 98.3 F (36.8 C) (03/14 0606) Pulse Rate:  [67-85] 85 (03/14 0606) Resp:  [16-26] 16 (03/14 0743) BP: (133-160)/(68-82) 135/77 mmHg (03/14 0606) SpO2:  [91 %-95 %] 95 % (03/14 0743) Last BM Date: 10/03/12  Intake/Output from previous day: 03/13 0701 - 03/14 0700 In: 3515 [P.O.:1260; I.V.:2255] Out: 1950 [Urine:1950] Intake/Output this shift:   No distress Pulm decreased bilateral bases CV RRR GI remains distended, wound without infection, a few more bs today, approp tender, stoma with minimal flatus and brown liquid output  Lab Results:   Recent Labs  10/04/12 0412  WBC 8.6  HGB 9.5*  HCT 29.5*  PLT 221   BMET  Recent Labs  10/05/12 1540 10/06/12 0437  NA 128* 131*  K 3.8 3.5  CL 94* 98  CO2 27 27  GLUCOSE 139* 149*  BUN 6 5*  CREATININE 0.63 0.54  CALCIUM 8.4 8.5    Assessment/Plan: POD 3 colostomy revision, bso 1. Cont pca until tolerating po 2. Cont cardiac meds po for now, if not improving or worsens may need to change to iv 3. pulm toilet, oob 4. Will leave sips/chips now, check kub for baseline, I think just has resolving ileus 5. Appreciate woc nursing evaluation 6. Lovenox, scds  [facility] 10/06/2012

## 2012-10-07 LAB — BASIC METABOLIC PANEL
BUN: 3 mg/dL — ABNORMAL LOW (ref 6–23)
Calcium: 8.8 mg/dL (ref 8.4–10.5)
Chloride: 96 mEq/L (ref 96–112)
Creatinine, Ser: 0.58 mg/dL (ref 0.50–1.10)
GFR calc Af Amer: >90 mL/min (ref >90)
GFR calc non Af Amer: 83 mL/min — ABNORMAL LOW (ref >90)

## 2012-10-07 LAB — PROTIME-INR: Prothrombin Time: 14.2 seconds (ref 11.6–15.2)

## 2012-10-07 LAB — TYPE AND SCREEN
ABO/RH(D): O NEG
Unit division: 0

## 2012-10-07 MED ORDER — HYDROCODONE-ACETAMINOPHEN 5-325 MG PO TABS: 1.0000 | ORAL_TABLET | ORAL | Status: DC | PRN

## 2012-10-07 MED ORDER — WARFARIN SODIUM 5 MG PO TABS
5.0000 mg | ORAL_TABLET | Freq: Once | ORAL | Status: AC
  Administered 2012-10-07: 5 mg via ORAL
  Filled 2012-10-07: qty 1

## 2012-10-07 NOTE — Consult Note (Signed)
WOC ostomy follow up Stoma type/location: LLQ, end colostomy, revision s/p prolapse Stomal assessment/size: pink, moist Output liquid brown with flatus in pouch Ostomy pouching: 2pc. Intact with good seal Offered pouch change with pt. Today, she has been caring for ostomy for a while and reports to me she feels very comfortable with pouch change, should she need it changed prior to discharge she is comfortable to change, supplies in her room and she is aware where they are located.  I have ordered her some additional supplies for the room also.   WOC team will follow along with you for continue support of patient as needed. Nafisah Runions [place] RN,CWOCN [phone]

## 2012-10-07 NOTE — Progress Notes (Signed)
ANTICOAGULATION CONSULT NOTE - Follow Up Consult  Pharmacy Consult for Warfarin Indication: atrial fibrillation  Allergies  Allergen Reactions  . Pindolol Swelling    Throat swelled up  . Iodine Other (See Comments)    BLISTERS  . Shellfish Allergy   . Vibramycin (Doxycycline Calcium) Nausea Only  . Doxycycline Nausea Only  . Levofloxacin Other (See Comments)    DIZZINESS  . Multaq (Dronedarone)     CAUSED SEVERE BURNING GI TRACT    Patient Measurements: Height: 5\' 2"  (157.5 cm) Weight: 134 lb (60.782 kg) IBW/kg (Calculated) : 50.1  Vital Signs: Temp: 97.6 F (36.4 C) (03/15 0535) Temp src: Oral (03/15 0535) BP: 138/73 mmHg (03/15 0535) Pulse Rate: 65 (03/15 0535)  Labs:  Recent Labs  10/05/12 1540 10/06/12 0437 10/07/12 0500  LABPROT  --   --  14.2  INR  --   --  1.11  CREATININE 0.63 0.54 0.58    Estimated Creatinine Clearance: 45.8 ml/min (by C-G formula based on Cr of 0.58).   Medications:  Scheduled:  . amiodarone  200 mg Oral QHS  . diltiazem  240 mg Oral q morning - 10a  . enoxaparin (LOVENOX) injection  40 mg Subcutaneous Q24H  . HYDROmorphone PCA 0.3 mg/mL   Intravenous Q4H  . metoprolol succinate  25 mg Oral q morning - 10a  . [COMPLETED] potassium chloride  10 mEq Intravenous Q1 Hr x 4  . [COMPLETED] warfarin  5 mg Oral ONCE-1800  . Warfarin - Pharmacist Dosing Inpatient   Does not apply q1800   Infusions:  . dextrose 5 % and 0.9 % NaCl with KCl 20 mEq/L 75 mL/hr at 10/07/12 1914    Assessment:  79 yo F on chronic warfarin for hx of AFib. Home dose reported as 5mg  daily  Hx of ovarian cancer and prolapsed colostomy underwent sigmoid colectomy and revision of colostomy 3/11 - warfarin stopped 3/6 for surgery  Warfarin resumed 3/14  INR subtherapeutic (1.11) as expected after first dose  Last CBC was on 3/12, will f/u in am. No bleeding reported in notes.   Goal of Therapy:  INR 2-3 Monitor platelets by anticoagulation protocol:  Yes   Plan:   Warfarin 5mg  po today  Continue Lovenox 40mg  sq q24h per MD  Daily PT/INR  [name], PharmD, BCPS Pager: [phone] 10/07/2012,9:13 AM

## 2012-10-07 NOTE — Progress Notes (Signed)
General Surgery Note  LOS: 4 days  POD -  4 Days Post-Op  Assessment/Plan: 1.  EXPLORATORY LAPAROTOMY, Sigmoid colectomy, COLOSTOMY REVISION - 10/03/2012 - M. Wakefield  Lysis of adhesions, infracolic omentectomy, bilateral salpingo-oophorectomy, resection of ileal nodule, optimal R0 tumor debulking - 10/03/2012 - P. Gehrig  For prolapsed colon and ovarian cancer.  Seen by Dr. Truett Perna.  Ostomy is working.  Patient ready to try liquids again.  Stop PCA.   2.  DVT prophylaxis - Lovenox 3.  CAD 4.  History of A. Fib. 5.  Restarting Coumadin  PT/INR - 14.2/1.11  Subjective:  Very talkative patient.  Daughter in law is in the room.  She's ready for some potato soup.  No nausea and almost no pain. Objective:   Filed Vitals:   10/07/12 0746  BP:   Pulse:   Temp:   Resp: 19     Intake/Output from previous day:  2022/11/02 0701 - 03/15 0700 In: 2000 [P.O.:120; I.V.:1480; IV Piggyback:400] Out: 3800 [Urine:3500; Stool:300]  Intake/Output this shift:      Physical Exam:   General: WN older WF who is alert and oriented.  Has green toboggan on.   HEENT: Normal. Pupils equal. .   Lungs: Clear.   Abdomen: Soft.   Wound: Incision looks good.  Colostomy with about 150 cc of loose brown stool.   Lab Results:   No results found for this basename: WBC, HGB, HCT, PLT,  in the last 72 hours  BMET   Recent Labs  Nov 01, 2012 0437 10/07/12 0500  NA 131* 129*  K 3.5 3.9  CL 98 96  CO2 27 27  GLUCOSE 149* 118*  BUN 5* 3*  CREATININE 0.54 0.58  CALCIUM 8.5 8.8    PT/INR   Recent Labs  10/07/12 0500  LABPROT 14.2  INR 1.11    ABG  No results found for this basename: PHART, PCO2, PO2, HCO3,  in the last 72 hours   Studies/Results:  Dg Abd 1 View  11/01/12  *RADIOLOGY REPORT*  Clinical Data: History of postoperative abdominal distention. History of bilateral oophorectomy.  ABDOMEN - 1 VIEW  Comparison: 04/01/2012.  Findings: Surgical staples are seen over the lower abdomen and over  the pelvis.  There is moderate gaseous distention of multiple loops of intestine.  Colonic and small intestine gas is present.  Pelvic phleboliths are seen.  Degenerative spondylosis is seen.  There is scoliosis convexity to the left.  IMPRESSION: Postoperative appearance.  Colonic and small intestinal gas present with multiple loops with gaseous distention on supine image.  Air- fluid levels cannot be evaluated without erect or decubitus examination.  I favor the pattern on supine image favors postoperative  ileus.   Original Report Authenticated By: Onalee Hua Call      Anti-infectives:   Anti-infectives   Start     Dose/Rate Route Frequency Ordered Stop   10/03/12 0845  metroNIDAZOLE (FLAGYL) IVPB 500 mg     500 mg 100 mL/hr over 60 Minutes Intravenous  Once 10/03/12 0839 10/03/12 0839   10/03/12 0544  ceFAZolin (ANCEF) IVPB 2 g/50 mL premix     2 g 100 mL/hr over 30 Minutes Intravenous On call to O.R. 10/03/12 0544 10/03/12 0737   10/03/12 0544  ceFAZolin (ANCEF) IVPB 2 g/50 mL premix  Status:  Discontinued     2 g 100 mL/hr over 30 Minutes Intravenous On call to O.R. 10/03/12 0544 10/03/12 0546   10/03/12 0544  ceFAZolin (ANCEF) IVPB 2 g/50 mL  premix  Status:  Discontinued     2 g 100 mL/hr over 30 Minutes Intravenous On call to O.R. 10/03/12 0544 10/03/12 0546      Ovidio Kin, MD, FACS Pager: [phone],   [facility] Surgery Office: [phone] 10/07/2012

## 2012-10-08 LAB — CBC
HCT: 28.2 % — ABNORMAL LOW (ref 36.0–46.0)
Hemoglobin: 9.4 g/dL — ABNORMAL LOW (ref 12.0–15.0)
MCHC: 33.3 g/dL (ref 30.0–36.0)
MCV: 97.9 fL (ref 78.0–100.0)
RDW: 15.7 % — ABNORMAL HIGH (ref 11.5–15.5)

## 2012-10-08 LAB — PROTIME-INR: INR: 1.39 (ref 0.00–1.49)

## 2012-10-08 MED ORDER — WARFARIN SODIUM 5 MG PO TABS
5.0000 mg | ORAL_TABLET | Freq: Once | ORAL | Status: AC
  Administered 2012-10-08: 5 mg via ORAL
  Filled 2012-10-08: qty 1

## 2012-10-08 NOTE — Progress Notes (Signed)
General Surgery Note  LOS: 5 days  POD -  5 Days Post-Op  Assessment/Plan: 1.  EXPLORATORY LAPAROTOMY, Sigmoid colectomy, COLOSTOMY REVISION - 10/03/2012 - M. Wakefield  Lysis of adhesions, infracolic omentectomy, bilateral salpingo-oophorectomy, resection of ileal nodule, optimal R0 tumor debulking - 10/03/2012 - P. Gehrig  For prolapsed colon and ovarian cancer.  Seen by Dr. Truett Perna.  Looks very good.  Will advance to reg diet.  2.  DVT prophylaxis - Lovenox 3.  CAD 4.  History of A. Fib. 5.  Restarted Coumadin  PT/INR - 16.7/1.39  Subjective:  Very talkative patient.  Son, Victoria [name], in the room.    She's ready for some potato soup.  Wants reg food.  No pain. Objective:   Filed Vitals:   10/08/12 0444  BP: 121/71  Pulse: 65  Temp: 98.1 F (36.7 C)  Resp: 18     Intake/Output from previous day:  03/15 0701 - 03/16 0700 In: 2020 [P.O.:240; I.V.:1780] Out: 2950 [Urine:2825; Stool:125]  Intake/Output this shift:      Physical Exam:   General: WN older WF who is alert and oriented.  Changed to white toboggan.  Moving well.   HEENT: Normal. Pupils equal. .   Lungs: Clear.   Abdomen: Soft.   Wound: Incision looks good.  Colostomy functioning.   Lab Results:     Recent Labs  10/08/12 0427  WBC 5.9  HGB 9.4*  HCT 28.2*  PLT 255    BMET    Recent Labs  Oct 20, 2012 0437 10/07/12 0500  NA 131* 129*  K 3.5 3.9  CL 98 96  CO2 27 27  GLUCOSE 149* 118*  BUN 5* 3*  CREATININE 0.54 0.58  CALCIUM 8.5 8.8    PT/INR    Recent Labs  10/07/12 0500 10/08/12 0427  LABPROT 14.2 16.7*  INR 1.11 1.39    ABG  No results found for this basename: PHART, PCO2, PO2, HCO3,  in the last 72 hours   Studies/Results:  Dg Abd 1 View  10/20/2012  *RADIOLOGY REPORT*  Clinical Data: History of postoperative abdominal distention. History of bilateral oophorectomy.  ABDOMEN - 1 VIEW  Comparison: 04/01/2012.  Findings: Surgical staples are seen over the lower abdomen and over the  pelvis.  There is moderate gaseous distention of multiple loops of intestine.  Colonic and small intestine gas is present.  Pelvic phleboliths are seen.  Degenerative spondylosis is seen.  There is scoliosis convexity to the left.  IMPRESSION: Postoperative appearance.  Colonic and small intestinal gas present with multiple loops with gaseous distention on supine image.  Air- fluid levels cannot be evaluated without erect or decubitus examination.  I favor the pattern on supine image favors postoperative  ileus.   Original Report Authenticated By: Onalee Hua Call      Anti-infectives:   Anti-infectives   Start     Dose/Rate Route Frequency Ordered Stop   10/03/12 0845  metroNIDAZOLE (FLAGYL) IVPB 500 mg     500 mg 100 mL/hr over 60 Minutes Intravenous  Once 10/03/12 0839 10/03/12 0839   10/03/12 0544  ceFAZolin (ANCEF) IVPB 2 g/50 mL premix     2 g 100 mL/hr over 30 Minutes Intravenous On call to O.R. 10/03/12 0544 10/03/12 0737   10/03/12 0544  ceFAZolin (ANCEF) IVPB 2 g/50 mL premix  Status:  Discontinued     2 g 100 mL/hr over 30 Minutes Intravenous On call to O.R. 10/03/12 0544 10/03/12 0546   10/03/12 0544  ceFAZolin (ANCEF) IVPB  2 g/50 mL premix  Status:  Discontinued     2 g 100 mL/hr over 30 Minutes Intravenous On call to O.R. 10/03/12 0544 10/03/12 0546      Ovidio Kin, MD, FACS Pager: [phone],   [facility] Office: [phone] 10/08/2012

## 2012-10-08 NOTE — Progress Notes (Signed)
ANTICOAGULATION CONSULT NOTE - Follow Up Consult  Pharmacy Consult for Warfarin Indication: atrial fibrillation  Allergies  Allergen Reactions  . Pindolol Swelling    Throat swelled up  . Iodine Other (See Comments)    BLISTERS  . Shellfish Allergy   . Vibramycin (Doxycycline Calcium) Nausea Only  . Doxycycline Nausea Only  . Levofloxacin Other (See Comments)    DIZZINESS  . Multaq (Dronedarone)     CAUSED SEVERE BURNING GI TRACT    Patient Measurements: Height: 5\' 2"  (157.5 cm) Weight: 134 lb (60.782 kg) IBW/kg (Calculated) : 50.1  Vital Signs: Temp: 98.1 F (36.7 C) (03/16 0444) Temp src: Oral (03/16 0444) BP: 121/71 mmHg (03/16 0444) Pulse Rate: 65 (03/16 0444)  Labs:  Recent Labs  10/05/12 1540 10/06/12 0437 10/07/12 0500 10/08/12 0427  HGB  --   --   --  9.4*  HCT  --   --   --  28.2*  PLT  --   --   --  255  LABPROT  --   --  14.2 16.7*  INR  --   --  1.11 1.39  CREATININE 0.63 0.54 0.58  --     Estimated Creatinine Clearance: 45.8 ml/min (by C-G formula based on Cr of 0.58).   Medications:  Scheduled:  . amiodarone  200 mg Oral QHS  . diltiazem  240 mg Oral q morning - 10a  . enoxaparin (LOVENOX) injection  40 mg Subcutaneous Q24H  . metoprolol succinate  25 mg Oral q morning - 10a  . [COMPLETED] warfarin  5 mg Oral ONCE-1800  . Warfarin - Pharmacist Dosing Inpatient   Does not apply q1800  . [DISCONTINUED] HYDROmorphone PCA 0.3 mg/mL   Intravenous Q4H   Infusions:  . [DISCONTINUED] dextrose 5 % and 0.9 % NaCl with KCl 20 mEq/L 75 mL/hr at 10/08/12 0541    Assessment:  79 yo F on chronic warfarin for hx of AFib. Home dose reported as 5mg  daily  Per patient, is only taking amiodarone for 2 weeks prior to and after surgery - effects on warfarin should be minimal  Hx of ovarian cancer and prolapsed colostomy underwent sigmoid colectomy and revision of colostomy 3/11 - warfarin stopped 3/6 for surgery  Warfarin resumed 3/14, continues on  lovenox 40mg  sq q24h  INR subtherapeutic (1.39) but responding  CBC stable, no bleeding/complications reported.  Goal of Therapy:  INR 2-3 Monitor platelets by anticoagulation protocol: Yes   Plan:   Warfarin 5mg  po today  Continue Lovenox 40mg  sq q24h per MD  Daily PT/INR  [name], PharmD, BCPS Pager: [phone] 10/08/2012,9:31 AM

## 2012-10-09 ENCOUNTER — Telehealth (INDEPENDENT_AMBULATORY_CARE_PROVIDER_SITE_OTHER): Payer: Self-pay | Admitting: *Deleted

## 2012-10-09 LAB — PROTIME-INR: INR: 1.68 — ABNORMAL HIGH (ref 0.00–1.49)

## 2012-10-09 NOTE — Progress Notes (Signed)
Pt d/c to home today. IV d/c'd. No noted dressing to abdominal incision. Colostomy intact. Colostomy supplies given for home use. Staples intact to incision-CDI. No RX's given,will continue home meds as previously taken. Son at bedside to assist with d/c. Pt aware to follow up with cardiologist for Coumadin tx. Pt d/c by ADON.

## 2012-10-09 NOTE — Telephone Encounter (Signed)
Patient called in to schedule staple removal.  Appt made for this Friday at 10a.

## 2012-10-09 NOTE — Progress Notes (Signed)
General Surgery Note  LOS: 6 days  POD -  6 Days Post-Op  Assessment/Plan: 1.  EXPLORATORY LAPAROTOMY, Sigmoid colectomy, COLOSTOMY REVISION - 10/03/2012 - M. Wakefield  Lysis of adhesions, infracolic omentectomy, bilateral salpingo-oophorectomy, resection of ileal nodule, optimal R0 tumor debulking - 10/03/2012 - P. Gehrig  For prolapsed colostomy and ovarian cancer.  Seen by Dr. Truett Perna for onc.  She thinks that she is to see him next Monday, 10/16/2012.  Looks very good.  Ready to go home.  Discharge instructions reviewed.  2.  CAD 3.  History of A. Fib. 4.  DVT prophylaxis -   Lovenox  Restarted Coumadin  PT/INR - 19.2/1.68 - 10/09/2012  She will check with Dr. Denman George ([facility]) - Coumadin clinic this week.  Subjective:  Doing well.  Ready to go home.  Reviewed plans for home.  She's having no pain and wants no pain meds.  Feels comfortable with ostomy changes.  Objective:   Filed Vitals:   10/09/12 0515  BP: 132/77  Pulse: 65  Temp: 98.5 F (36.9 C)  Resp: 16     Intake/Output from previous day:  03/16 0701 - 03/17 0700 In: 1200 [P.O.:1200] Out: 2800 [Urine:2800]  Intake/Output this shift:      Physical Exam:   General: WN older WF who is alert and oriented.  Changed to white toboggan.  Moving well.   HEENT: Normal. Pupils equal. .   Lungs: Clear.   Abdomen: Soft.   Wound: Incision looks good.  Will leave staples to be removed later in our office.  Colostomy functioning.   Lab Results:     Recent Labs  10/08/12 0427  WBC 5.9  HGB 9.4*  HCT 28.2*  PLT 255    BMET    Recent Labs  10/07/12 0500  NA 129*  K 3.9  CL 96  CO2 27  GLUCOSE 118*  BUN 3*  CREATININE 0.58  CALCIUM 8.8    PT/INR    Recent Labs  10/08/12 0427 10/09/12 0415  LABPROT 16.7* 19.2*  INR 1.39 1.68*    ABG  No results found for this basename: PHART, PCO2, PO2, HCO3,  in the last 72 hours   Studies/Results:  No results  found.   Anti-infectives:   Anti-infectives   Start     Dose/Rate Route Frequency Ordered Stop   10/03/12 0845  metroNIDAZOLE (FLAGYL) IVPB 500 mg     500 mg 100 mL/hr over 60 Minutes Intravenous  Once 10/03/12 0839 10/03/12 0839   10/03/12 0544  ceFAZolin (ANCEF) IVPB 2 g/50 mL premix     2 g 100 mL/hr over 30 Minutes Intravenous On call to O.R. 10/03/12 0544 10/03/12 0737   10/03/12 0544  ceFAZolin (ANCEF) IVPB 2 g/50 mL premix  Status:  Discontinued     2 g 100 mL/hr over 30 Minutes Intravenous On call to O.R. 10/03/12 0544 10/03/12 0546   10/03/12 0544  ceFAZolin (ANCEF) IVPB 2 g/50 mL premix  Status:  Discontinued     2 g 100 mL/hr over 30 Minutes Intravenous On call to O.R. 10/03/12 0544 10/03/12 0546      Ovidio Kin, MD, FACS Pager: [phone],   [facility] Office: [phone] 10/09/2012

## 2012-10-10 NOTE — Discharge Summary (Signed)
Physician Discharge Summary  Patient ID: Victoria [name] MRN: [id number] DOB/AGE: 1926-08-02 79 y.o.  Admit date: 10/03/2012 Discharge date: 10/10/2012  Admission Diagnoses: 1. Ovarian cancer 2. Presence of colostomy 3. Atrial fibrillation 4. Coumadin therapy 5. Pacemaker  Discharge Diagnoses:  Principal Problem:   Malignant neoplasm of ovary postoperative ileus, resolved  Discharged Condition: good  Hospital Course: 52 yof I know well from prior laparotomy for ovarian cancer with malignant sbo and tumor invading into rectum.  I performed loop colostomy and small bowel bypass.  She has then undergone chemotherapy which she has tolerated fairly well. During this time her colostomy prolapsed and appears she has had reasonable response to chemo.  Dr. Duard Brady of gyn onc and I took her to or to perform procedure below.  She had great response to chemo intraoperatively.  She was maintained postop on the floor. She had ileus that resolved fairly quickly with her stoma beginning to function. She was seen by ostomy care.  Her coumadin was restarted also.  She was discharged home tol diet and doing well.  Consults: None  Significant Diagnostic Studies: none  Treatments: surgery: Exploratory laparotomy, lysis of adhesions, infracolic omentectomy, bilateral salpingo-oophorectomy, resection of ileal nodule, optimal R0 tumor debulking, sigmoid colectomy and end colostomy    Disposition: 01-Home or Self Care  Discharge Orders   Future Appointments Provider Department Dept Phone   10/13/2012 10:00 AM [facility] Nurse [facility], [place] [phone]   10/16/2012 11:00 AM Ladene Artist, MD [facility] [phone]   Future Orders Complete By Expires     Diet - low sodium heart healthy  As directed     Increase activity slowly  As directed         Medication List    TAKE these medications       amiodarone 200 MG tablet  Commonly known as:   PACERONE  Take 200 mg by mouth at bedtime.     diltiazem 240 MG 24 hr capsule  Commonly known as:  CARDIZEM CD  Take 240 mg by mouth every morning.     feeding supplement Liqd  Take 237 mLs by mouth 2 (two) times daily.     lidocaine-prilocaine cream  Commonly known as:  EMLA  Apply topically as needed. Apply to [facility] site 1-2 hours prior to stick and cover with plastic wrap to numb site     metoprolol succinate 25 MG 24 hr tablet  Commonly known as:  TOPROL-XL  Take 25 mg by mouth every morning.     nitrofurantoin (macrocrystal-monohydrate) 100 MG capsule  Commonly known as:  MACROBID  Take 100 mg by mouth 2 (two) times daily. Has completed dose (02 09 2014)     ondansetron 4 MG tablet  Commonly known as:  ZOFRAN  Take 4 mg by mouth every 8 (eight) hours as needed for nausea.     simvastatin 20 MG tablet  Commonly known as:  ZOCOR  Take 20 mg by mouth every evening.     warfarin 5 MG tablet  Commonly known as:  COUMADIN  Take 5 mg by mouth at bedtime. As directed         Signed: Jarquis Walker 10/10/2012, 11:04 AM

## 2012-10-12 ENCOUNTER — Telehealth (INDEPENDENT_AMBULATORY_CARE_PROVIDER_SITE_OTHER): Payer: Self-pay

## 2012-10-12 ENCOUNTER — Encounter (HOSPITAL_COMMUNITY): Payer: Self-pay | Admitting: *Deleted

## 2012-10-12 ENCOUNTER — Emergency Department (HOSPITAL_COMMUNITY)
Admission: EM | Admit: 2012-10-12 | Discharge: 2012-10-12 | Disposition: A | Discharge status: Home / Self Care | Payer: Medicare Other | Attending: Emergency Medicine | Admitting: Emergency Medicine

## 2012-10-12 DIAGNOSIS — Z7901 Long term (current) use of anticoagulants: Secondary | ICD-10-CM | POA: Insufficient documentation

## 2012-10-12 DIAGNOSIS — K5900 Constipation, unspecified: Secondary | ICD-10-CM | POA: Insufficient documentation

## 2012-10-12 DIAGNOSIS — C189 Malignant neoplasm of colon, unspecified: Secondary | ICD-10-CM | POA: Insufficient documentation

## 2012-10-12 DIAGNOSIS — K5730 Diverticulosis of large intestine without perforation or abscess without bleeding: Secondary | ICD-10-CM | POA: Insufficient documentation

## 2012-10-12 DIAGNOSIS — E785 Hyperlipidemia, unspecified: Secondary | ICD-10-CM | POA: Insufficient documentation

## 2012-10-12 DIAGNOSIS — Z933 Colostomy status: Secondary | ICD-10-CM | POA: Insufficient documentation

## 2012-10-12 DIAGNOSIS — I2510 Atherosclerotic heart disease of native coronary artery without angina pectoris: Secondary | ICD-10-CM | POA: Insufficient documentation

## 2012-10-12 DIAGNOSIS — Z79899 Other long term (current) drug therapy: Secondary | ICD-10-CM | POA: Insufficient documentation

## 2012-10-12 DIAGNOSIS — C20 Malignant neoplasm of rectum: Secondary | ICD-10-CM | POA: Insufficient documentation

## 2012-10-12 DIAGNOSIS — Z950 Presence of cardiac pacemaker: Secondary | ICD-10-CM | POA: Insufficient documentation

## 2012-10-12 LAB — CBC WITH DIFFERENTIAL/PLATELET
Basophils Absolute: 0 10*3/uL (ref 0.0–0.1)
Basophils Relative: 0 % (ref 0–1)
Eosinophils Absolute: 0.2 10*3/uL (ref 0.0–0.7)
Eosinophils Relative: 3 % (ref 0–5)
HCT: 33.2 % — ABNORMAL LOW (ref 36.0–46.0)
Hemoglobin: 11.1 g/dL — ABNORMAL LOW (ref 12.0–15.0)
Lymphocytes Relative: 18 % (ref 12–46)
Lymphs Abs: 1.3 10*3/uL (ref 0.7–4.0)
MCH: 32.3 pg (ref 26.0–34.0)
MCHC: 33.4 g/dL (ref 30.0–36.0)
MCV: 96.5 fL (ref 78.0–100.0)
Monocytes Absolute: 0.6 10*3/uL (ref 0.1–1.0)
Monocytes Relative: 8 % (ref 3–12)
Neutro Abs: 5.3 10*3/uL (ref 1.7–7.7)
Neutrophils Relative %: 72 % (ref 43–77)
Platelets: 293 10*3/uL (ref 150–400)
RBC: 3.44 MIL/uL — ABNORMAL LOW (ref 3.87–5.11)
RDW: 15.5 % (ref 11.5–15.5)
WBC: 7.4 10*3/uL (ref 4.0–10.5)

## 2012-10-12 LAB — COMPREHENSIVE METABOLIC PANEL
ALT: 9 U/L (ref 0–35)
AST: 16 U/L (ref 0–37)
Albumin: 3.5 g/dL (ref 3.5–5.2)
Alkaline Phosphatase: 100 U/L (ref 39–117)
BUN: 6 mg/dL (ref 6–23)
CO2: 26 mEq/L (ref 19–32)
Calcium: 9.1 mg/dL (ref 8.4–10.5)
Chloride: 96 mEq/L (ref 96–112)
Creatinine, Ser: 0.71 mg/dL (ref 0.50–1.10)
GFR calc Af Amer: 90 mL/min — ABNORMAL LOW (ref >90)
GFR calc non Af Amer: 78 mL/min — ABNORMAL LOW (ref >90)
Glucose, Bld: 102 mg/dL — ABNORMAL HIGH (ref 70–99)
Potassium: 3.7 mEq/L (ref 3.5–5.1)
Sodium: 132 mEq/L — ABNORMAL LOW (ref 135–145)
Total Bilirubin: 0.2 mg/dL — ABNORMAL LOW (ref 0.3–1.2)
Total Protein: 7.1 g/dL (ref 6.0–8.3)

## 2012-10-12 MED ORDER — POLYETHYLENE GLYCOL 3350 17 G PO PACK
17.0000 g | PACK | Freq: Every day | ORAL | Status: DC
  Administered 2012-10-12: 17 g via ORAL
  Filled 2012-10-12: qty 1

## 2012-10-12 MED ORDER — POLYETHYLENE GLYCOL 3350 17 G PO PACK: 17.0000 g | PACK | Freq: Every day | ORAL | Status: DC

## 2012-10-12 MED ORDER — DOCUSATE SODIUM 100 MG PO CAPS: 100.0000 mg | ORAL_CAPSULE | Freq: Two times a day (BID) | ORAL | Status: DC

## 2012-10-12 NOTE — Progress Notes (Signed)
  Subjective: 79 yo WF recently in hospital for exp lap, conversion of loop colostomy to end colostomy, debulking from 3/11 - 3/18 comes in c/o no stool from ostomy since tues. Has had intermittent air in bag. No f/c/n/v/abd pain/bloating/dysuria/CP/sob. Eating great. Some mild incisional soreness. No distension.   Objective: Vital signs in last 24 hours: Temp:  [97.9 F (36.6 C)] 97.9 F (36.6 C) (03/20 1720) Pulse Rate:  [60] 60 (03/20 1720) Resp:  [18] 18 (03/20 1720) BP: (133)/(78) 133/78 mmHg (03/20 1720) SpO2:  [99 %] 99 % (03/20 1720) Weight:  [126 lb (57.153 kg)] 126 lb (57.153 kg) (03/20 1720)    Intake/Output from previous day:   Intake/Output this shift:    Alert, ox3, pleasant, smiling. Looks great cta Reg Soft, nt, nd. Incision c/d/i with staples. No cellulitis. Ostomy Left side - air in bag. Ostomy patent.  No edema   Lab Results:   Recent Labs  10/12/12 1740  WBC 7.4  HGB 11.1*  HCT 33.2*  PLT 293   BMET  Recent Labs  10/12/12 1740  NA 132*  K 3.7  CL 96  CO2 26  GLUCOSE 102*  BUN 6  CREATININE 0.71  CALCIUM 9.1   PT/INR No results found for this basename: LABPROT, INR,  in the last 72 hours ABG No results found for this basename: PHART, PCO2, PO2, HCO3,  in the last 72 hours  Studies/Results: No results found.  Anti-infectives: Anti-infectives   None      Assessment/Plan: S/p Exploratory laparotomy, lysis of adhesions, infracolic omentectomy, bilateral salpingo-oophorectomy, resection of ileal nodule, optimal R0 tumor debulking, partial sigmoid colectomy and end colostomy   No signs of systemic illness Constipation  Staples removed. And steri-strips applied Discuss bowel regimen with pt and family Drink plenty of water.  Bid colace Daily Miralax for now.  If develop loose stool - stop laxative.  Discuss call for parameters with family. Discussed recs with Dr Juleen China who has agreed to handle ER paperwork.  Cancel appt with  [facility] in am Keep postop appt with Dr Dwain Sarna.  To get a dose of MiraLax in ED  Mary Sella. Andrey Campanile, MD, FACS General, Bariatric, & Minimally Invasive Surgery [facility], [place]   LOS: 0 days    Atilano Ina 10/12/2012

## 2012-10-12 NOTE — Telephone Encounter (Signed)
Patient called in to let Dr Dwain Sarna know she has not had any output in her ostomy bag since Tuesday. The bag is feeling up with gas. She has no abdominal pain or cramping, no n/v either. I told her to make sure she is drinking a lot of water and asked if she had some miralax to take to help get her bowels moving again. She said she would try it and will call me back later today to let me know if bowels are moving yet. I let Dr Doreen Salvage nurse about this and she will let him know and we will call her back if he suggests anything further.

## 2012-10-12 NOTE — Discharge Instructions (Signed)
Constipation, Adult  Constipation is when a person:   Poops (bowel movement) less than 3 times a week.   Has a hard time pooping.   Has poop that is dry, hard, or bigger than normal.  HOME CARE    Eat more fiber, such as fruits, vegetables, whole grains like brown rice, and beans.   Eat less fatty foods and sugar. This includes French fries, hamburgers, cookies, candy, and soda.   If you are not getting enough fiber from food, take products with added fiber in them (supplements).   Drink enough fluid to keep your pee (urine) clear or pale yellow.   Go to the restroom when you feel like you need to poop. Do not hold it.   Only take medicine as told by your doctor. Do not take medicines that help you poop (laxatives) without talking to your doctor first.   Exercise on a regular basis, or as told by your doctor.  GET HELP RIGHT AWAY IF:    You have bright red blood in your poop (stool).   Your constipation lasts more than 4 days or gets worse.   You have belly (abdomen) or butt (rectal) pain.   You have thin poop (as thin as a pencil).   You lose weight, and it cannot be explained.  MAKE SURE YOU:    Understand these instructions.   Will watch your condition.   Will get help right away if you are not doing well or get worse.  Document Released: 12/29/2007 Document Revised: 10/04/2011 Document Reviewed: 06/15/2011  Exit[employer] Patient Information 2013 [employer].

## 2012-10-12 NOTE — ED Notes (Signed)
Pt from home with reports of no output from ostomy since Tuesday. Pt reports abdominal surgery for replacement of ostomy on 10/03/12. Pt reports hx of ovarian and rectal cancer. Pt denies N/V/D.

## 2012-10-12 NOTE — ED Notes (Signed)
Pt states she had reconstructive colostomy surgery on 3/11, states has been having output from colostomy until yesterday, did not have output yesterday or today, denies pain, denies n/v/d, abdomen soft, non-distended. Staples midline abdomen intact, states suppose to get out tomorrow morning at 10 am. Pt in no distress. Stoma pink in color.

## 2012-10-12 NOTE — ED Notes (Signed)
MD at bedside. 

## 2012-10-12 NOTE — ED Provider Notes (Signed)
Pt with long ER wait and graciously seen by Dr Andrey Campanile prior to my evaluation. Pt with recent sigmoid colectomy and revision of colostomy. Presented because of decreased ostomy output. Air in bag, no increased pain, good appetite and reassuring exam. Bowel regimen and outpt surgical FU.    Raeford Razor, MD 10/16/12 [phone]

## 2012-10-12 NOTE — Telephone Encounter (Signed)
Pt was called to make a nurse only visit for staple removal and pt said by the way since you called I haven't had any output in my colostomy bag since Tuesday 3/18 pm. The pt is having very little gas no abdominal pain or bloating. The pt is hardly eating and drinking. The pt sounds ok on the phone but is concerned about no output in her bag. I paged Dr Dwain Sarna to run this info by him and recommended her to go to [facility] ER today. The pt will go to [facility] ER.  I paged Will P.A. To let him know about the pt coming to the ER and paged [facility] charge nurse to let them know.

## 2012-10-13 ENCOUNTER — Encounter (INDEPENDENT_AMBULATORY_CARE_PROVIDER_SITE_OTHER): Payer: Medicare Other

## 2012-10-13 ENCOUNTER — Ambulatory Visit: Payer: Self-pay | Admitting: Cardiovascular Disease

## 2012-10-13 DIAGNOSIS — I480 Paroxysmal atrial fibrillation: Secondary | ICD-10-CM

## 2012-10-13 DIAGNOSIS — Z7901 Long term (current) use of anticoagulants: Secondary | ICD-10-CM

## 2012-10-16 ENCOUNTER — Ambulatory Visit (HOSPITAL_BASED_OUTPATIENT_CLINIC_OR_DEPARTMENT_OTHER): Payer: Medicare Other | Admitting: Lab

## 2012-10-16 ENCOUNTER — Ambulatory Visit (HOSPITAL_BASED_OUTPATIENT_CLINIC_OR_DEPARTMENT_OTHER): Payer: Medicare Other | Admitting: Oncology

## 2012-10-16 ENCOUNTER — Encounter (INDEPENDENT_AMBULATORY_CARE_PROVIDER_SITE_OTHER): Payer: Self-pay | Admitting: General Surgery

## 2012-10-16 ENCOUNTER — Ambulatory Visit (INDEPENDENT_AMBULATORY_CARE_PROVIDER_SITE_OTHER): Payer: Medicare Other | Admitting: General Surgery

## 2012-10-16 ENCOUNTER — Telehealth: Payer: Self-pay | Admitting: Oncology

## 2012-10-16 VITALS — BP 125/62 | HR 59 | Temp 97.4°F | Resp 18 | Ht 62.0 in | Wt 125.6 lb

## 2012-10-16 VITALS — BP 146/70 | HR 62 | Temp 97.7°F | Ht 61.0 in | Wt 125.8 lb

## 2012-10-16 DIAGNOSIS — C785 Secondary malignant neoplasm of large intestine and rectum: Secondary | ICD-10-CM

## 2012-10-16 DIAGNOSIS — C569 Malignant neoplasm of unspecified ovary: Secondary | ICD-10-CM

## 2012-10-16 DIAGNOSIS — I4891 Unspecified atrial fibrillation: Secondary | ICD-10-CM

## 2012-10-16 DIAGNOSIS — Z09 Encounter for follow-up examination after completed treatment for conditions other than malignant neoplasm: Secondary | ICD-10-CM

## 2012-10-16 DIAGNOSIS — Z7901 Long term (current) use of anticoagulants: Secondary | ICD-10-CM

## 2012-10-16 LAB — PROTIME-INR: Protime: 44.4 Seconds — ABNORMAL HIGH (ref 10.6–13.4)

## 2012-10-16 NOTE — Telephone Encounter (Signed)
Gave pt appt for lab, chemo and MD for March, April 2014, sent pt to labs today

## 2012-10-16 NOTE — Progress Notes (Signed)
[facility]    OFFICE PROGRESS NOTE   INTERVAL HISTORY:   She returns as scheduled. She underwent exploratory laparotomy, lysis of adhesions, infracolic omentectomy, and bilateral salpingo-oophorectomy on 10/03/2012. Dr. performed a sigmoid colectomy and revision of the colostomy to an end colostomy and Hartmann's pouch. A 1 cm nodule at the ileal anastomosis and a 4 cm right ovarian mass were noted at the time of surgery. There was no residual disease at completion of the procedure.  The pathology revealed multiple microscopic foci of residual serous carcinoma involving the right ovary. A background of extensive necrosis and fibrosis was noted. The left ovary and fallopian tube were benign. the omentum, small intestine biopsy, and colon resection showed no evidence of malignancy.  [name] reports an uneventful postoperative recovery. She was evaluated in the emergency room by Dr. Andrey Campanile on 10/12/2012 with constipation. This improved with MiraLAX and Colace.  No complaint today. No neuropathy symptoms.    Objective:  Vital signs in last 24 hours:  Blood pressure 125/62, pulse 59, temperature 97.4 F (36.3 C), temperature source Oral, resp. rate 18, height 5\' 2"  (1.575 m), weight 125 lb 9.6 oz (56.972 kg).   Resp: Lungs clear bilaterally with decreased breath sounds over the right lower chest, no respiratory distress Cardio: Regular rate and rhythm GI: Healed midline incision with Steri-Strips in place, left lower quadrant colostomy Vascular: No leg edema  Portacath/PICC-without erythema  Lab Results:  Lab Results  Component Value Date   WBC 7.4 10/12/2012   HGB 11.1* 10/12/2012   HCT 33.2* 10/12/2012   MCV 96.5 10/12/2012   PLT 293 10/12/2012      Medications: I have reviewed the patient's current medications.  Assessment/Plan: 1. Ovarian cancer-presenting with a Rectal mass , status post sigmoidoscopy 03/13/2012 with findings of a 5 cm malignant  appearing mass with central ulceration in the rectum 2-3 cm proximal to the anal verge. Pathology showed moderate to poorly differentiated adenocarcinoma with ulceration and prolapse changes. By immunohistochemistry the malignant cells were positive for cytokeratin 7, estrogen receptor and WT-1. They were negative for cytokeratin 20 and CDX-2. This immunohistochemical profile was strongly suggestive of a gynecologic primary. -Status post 3 cycles of Taxol/carboplatin chemotherapy with normalization of the CA 125, restaging CT 08/07/2012 with marketed improvement in the pelvic masses and no evidence of progressive ovarian cancer. She appears cycle 4 of Taxol/carboplatin 08/22/2012. -Status post an omentectomy and bilateral oophorectomy 10/03/2012 with microscopic foci of residual serous carcinoma involving the right ovary, no gross residual disease following surgery 2. CT abdomen/pelvis on 03/02/2012 with a 6.7 cm mass along the right aspect of the rectum; 2 adjacent nodal masses in the right mid abdomen measuring 8.9 x 7.4 x 7.2 cm and an additional 3.0 x 3.0 x 2.6 cm nodal mass posterior to the cecum. 3. PET scan 03/22/2012 with an intensely hypermetabolic large mass along the right aspect of the rectum; an intensely hypermetabolic right iliac fossa mass; increased caliber of small bowel loops containing air-fluid levels. 4. Hospitalization with nausea/vomiting, abdominal distention due to a small bowel obstruction status post bypass procedure and descending loop colostomy, and gastrostomy tube placement 04/04/2012. The gastrostomy tube has been removed. 5. Atrial fibrillation maintained on Coumadin.  6. Status post Port-A-Cath placement 05/04/2012.       7.   History of neutropenia secondary to chemotherapy causing a treatment delay following cycle 2. The Taxol was dose reduced beginning with cycle 3  8.   Prolapse of the colostomy-status post surgical repair 10/03/2012   Disposition:  She has  recovered from the debulking surgery. [name] has completed 4 cycles of Taxol/carboplatin chemotherapy with an excellent clinical response. I recommend completing 2 additional cycles of chemotherapy. She is scheduled to proceed with cycle 5 on 10/24/2012.   Thornton Papas, MD  10/16/2012  1:53 PM

## 2012-10-17 ENCOUNTER — Telehealth: Payer: Self-pay | Admitting: *Deleted

## 2012-10-17 NOTE — Telephone Encounter (Signed)
Spoke with pt regarding PT/INR results; per Dr. Truett Perna instructed pt to hold coumadin 3/25, 3/26.  Pt verbalized understanding and stated she is calling "[facility] this morning Dr. Salena Saner, that is who follows my coumadin"  Dr Truett Perna made aware.

## 2012-10-17 NOTE — Progress Notes (Signed)
Subjective:     Patient ID: Victoria [name], female   DOB: [birth year], 79 y.o.   MRN: [id number]  HPI 54 yof who recently underwent elap in combination with gyn onc where I did partial colectomy, hartmanns and end colostomy for prolapsed stoma.  She has done well.  Had a little bit of an ileus that is resolved.  She is having bms and tolerating diet now.  She really has no complaints today.  Review of Systems     Objective:   Physical Exam Pink stoma with stool and air in bag Midline wound healed without infection    Assessment:     S/p revision colostomy Ovarian cancer     Plan:     She is ok from my standpoint to resume chemotherapy next week. Staples have been removed at this point I told her she has no dietary restrictions and to continue bowel regimen I will see back after she completes chemotherapy or sooner if needed

## 2012-10-17 NOTE — Telephone Encounter (Signed)
Message copied by Primus Bravo P on Tue Oct 17, 2012  9:29 AM ------      Message from: Thornton Papas B      Created: Mon Oct 16, 2012  9:35 PM       Please call patient, hold coumadin 3/25, 3/26            Please refer to coumadin clinic for dosing/management ------

## 2012-10-19 ENCOUNTER — Telehealth: Payer: Self-pay | Admitting: Oncology

## 2012-10-19 NOTE — Telephone Encounter (Signed)
Called pt today and left message for 4/1 and advise to get a calendar for April and May 2014

## 2012-10-22 ENCOUNTER — Other Ambulatory Visit: Payer: Self-pay | Admitting: Oncology

## 2012-10-23 ENCOUNTER — Other Ambulatory Visit: Payer: Self-pay | Admitting: *Deleted

## 2012-10-23 DIAGNOSIS — C20 Malignant neoplasm of rectum: Secondary | ICD-10-CM

## 2012-10-24 ENCOUNTER — Other Ambulatory Visit: Payer: Medicare Other | Admitting: Lab

## 2012-10-24 ENCOUNTER — Ambulatory Visit (HOSPITAL_BASED_OUTPATIENT_CLINIC_OR_DEPARTMENT_OTHER): Payer: Medicare Other

## 2012-10-24 ENCOUNTER — Other Ambulatory Visit (HOSPITAL_BASED_OUTPATIENT_CLINIC_OR_DEPARTMENT_OTHER): Payer: Medicare Other | Admitting: Lab

## 2012-10-24 VITALS — BP 133/77 | HR 69 | Temp 97.7°F

## 2012-10-24 DIAGNOSIS — C569 Malignant neoplasm of unspecified ovary: Secondary | ICD-10-CM

## 2012-10-24 DIAGNOSIS — C785 Secondary malignant neoplasm of large intestine and rectum: Secondary | ICD-10-CM

## 2012-10-24 DIAGNOSIS — C20 Malignant neoplasm of rectum: Secondary | ICD-10-CM

## 2012-10-24 DIAGNOSIS — Z5111 Encounter for antineoplastic chemotherapy: Secondary | ICD-10-CM

## 2012-10-24 LAB — CBC WITH DIFFERENTIAL/PLATELET
BASO%: 0.3 % (ref 0.0–2.0)
EOS%: 3.8 % (ref 0.0–7.0)
HCT: 33.5 % — ABNORMAL LOW (ref 34.8–46.6)
LYMPH%: 17.4 % (ref 14.0–49.7)
MCH: 31 pg (ref 25.1–34.0)
MCHC: 32.5 g/dL (ref 31.5–36.0)
NEUT%: 69.7 % (ref 38.4–76.8)
Platelets: 233 10*3/uL (ref 145–400)
RBC: 3.52 10*6/uL — ABNORMAL LOW (ref 3.70–5.45)
nRBC: 0 % (ref 0–0)

## 2012-10-24 LAB — COMPREHENSIVE METABOLIC PANEL (CC13)
ALT: 8 U/L (ref 0–55)
AST: 17 U/L (ref 5–34)
Alkaline Phosphatase: 89 U/L (ref 40–150)
BUN: 16.2 mg/dL (ref 7.0–26.0)
Creatinine: 0.9 mg/dL (ref 0.6–1.1)
Total Bilirubin: 0.29 mg/dL (ref 0.20–1.20)

## 2012-10-24 MED ORDER — DIPHENHYDRAMINE HCL 50 MG/ML IJ SOLN
25.0000 mg | Freq: Once | INTRAMUSCULAR | Status: AC
  Administered 2012-10-24: 25 mg via INTRAVENOUS

## 2012-10-24 MED ORDER — SODIUM CHLORIDE 0.9 % IV SOLN
65.0000 mg/m2 | Freq: Once | INTRAVENOUS | Status: AC
  Administered 2012-10-24: 102 mg via INTRAVENOUS
  Filled 2012-10-24: qty 17

## 2012-10-24 MED ORDER — SODIUM CHLORIDE 0.9 % IJ SOLN
10.0000 mL | INTRAMUSCULAR | Status: DC | PRN
  Administered 2012-10-24: 10 mL
  Filled 2012-10-24: qty 10

## 2012-10-24 MED ORDER — SODIUM CHLORIDE 0.9 % IV SOLN
400.0000 mg | Freq: Once | INTRAVENOUS | Status: AC
  Administered 2012-10-24: 400 mg via INTRAVENOUS
  Filled 2012-10-24: qty 40

## 2012-10-24 MED ORDER — HEPARIN SOD (PORK) LOCK FLUSH 100 UNIT/ML IV SOLN
500.0000 [IU] | Freq: Once | INTRAVENOUS | Status: AC | PRN
  Administered 2012-10-24: 500 [IU]
  Filled 2012-10-24: qty 5

## 2012-10-24 MED ORDER — SODIUM CHLORIDE 0.9 % IV SOLN
Freq: Once | INTRAVENOUS | Status: AC
  Administered 2012-10-24: 09:00:00 via INTRAVENOUS

## 2012-10-24 MED ORDER — ONDANSETRON 16 MG/50ML IVPB (CHCC)
16.0000 mg | Freq: Once | INTRAVENOUS | Status: AC
  Administered 2012-10-24: 16 mg via INTRAVENOUS

## 2012-10-24 MED ORDER — DEXAMETHASONE SODIUM PHOSPHATE 10 MG/ML IJ SOLN
10.0000 mg | Freq: Once | INTRAMUSCULAR | Status: AC
  Administered 2012-10-24: 10 mg via INTRAVENOUS

## 2012-10-24 MED ORDER — FAMOTIDINE IN NACL 20-0.9 MG/50ML-% IV SOLN
20.0000 mg | Freq: Once | INTRAVENOUS | Status: AC
  Administered 2012-10-24: 20 mg via INTRAVENOUS

## 2012-10-24 NOTE — Patient Instructions (Signed)
[facility] Discharge Instructions for Patients Receiving Chemotherapy  Today you received the following chemotherapy agents Taxol  To help prevent nausea and vomiting after your treatment, we encourage you to take your nausea medication as prescribed.  If you develop nausea and vomiting that is not controlled by your nausea medication, call the clinic. If it is after clinic hours your family physician or the after hours number for the clinic or go to the Emergency Department.   BELOW ARE SYMPTOMS THAT SHOULD BE REPORTED IMMEDIATELY:  *FEVER GREATER THAN 100.5 F  *CHILLS WITH OR WITHOUT FEVER  NAUSEA AND VOMITING THAT IS NOT CONTROLLED WITH YOUR NAUSEA MEDICATION  *UNUSUAL SHORTNESS OF BREATH  *UNUSUAL BRUISING OR BLEEDING  TENDERNESS IN MOUTH AND THROAT WITH OR WITHOUT PRESENCE OF ULCERS  *URINARY PROBLEMS  *BOWEL PROBLEMS  UNUSUAL RASH Items with * indicate a potential emergency and should be followed up as soon as possible.  One of the nurses will contact you 24 hours after your treatment. Please let the nurse know about any problems that you may have experienced. Feel free to call the clinic you have any questions or concerns. The clinic phone number is [phone].   I have been informed and understand all the instructions given to me. I know to contact the clinic, my physician, or go to the Emergency Department if any problems should occur. I do not have any questions at this time, but understand that I may call the clinic during office hours   should I have any questions or need assistance in obtaining follow up care.    __________________________________________  _____________  __________ Signature of Patient or Authorized Representative            Date                   Time    __________________________________________ Nurse's Signature    

## 2012-10-31 ENCOUNTER — Ambulatory Visit (HOSPITAL_BASED_OUTPATIENT_CLINIC_OR_DEPARTMENT_OTHER): Payer: Medicare Other

## 2012-10-31 ENCOUNTER — Other Ambulatory Visit (HOSPITAL_BASED_OUTPATIENT_CLINIC_OR_DEPARTMENT_OTHER): Payer: Medicare Other | Admitting: Lab

## 2012-10-31 VITALS — BP 139/68 | HR 64 | Temp 97.1°F | Resp 18

## 2012-10-31 DIAGNOSIS — C569 Malignant neoplasm of unspecified ovary: Secondary | ICD-10-CM

## 2012-10-31 DIAGNOSIS — Z5111 Encounter for antineoplastic chemotherapy: Secondary | ICD-10-CM

## 2012-10-31 DIAGNOSIS — I4891 Unspecified atrial fibrillation: Secondary | ICD-10-CM

## 2012-10-31 LAB — BASIC METABOLIC PANEL (CC13)
BUN: 11.4 mg/dL (ref 7.0–26.0)
CO2: 24 mEq/L (ref 22–29)
Chloride: 102 mEq/L (ref 98–107)
Potassium: 4.4 mEq/L (ref 3.5–5.1)

## 2012-10-31 LAB — CBC WITH DIFFERENTIAL/PLATELET
BASO%: 0.6 % (ref 0.0–2.0)
Basophils Absolute: 0 10*3/uL (ref 0.0–0.1)
Eosinophils Absolute: 0.1 10*3/uL (ref 0.0–0.5)
HCT: 31.4 % — ABNORMAL LOW (ref 34.8–46.6)
HGB: 10.2 g/dL — ABNORMAL LOW (ref 11.6–15.9)
MCHC: 32.5 g/dL (ref 31.5–36.0)
MONO#: 0.2 10*3/uL (ref 0.1–0.9)
NEUT#: 2.5 10*3/uL (ref 1.5–6.5)
NEUT%: 71.1 % (ref 38.4–76.8)
WBC: 3.5 10*3/uL — ABNORMAL LOW (ref 3.9–10.3)
lymph#: 0.8 10*3/uL — ABNORMAL LOW (ref 0.9–3.3)

## 2012-10-31 LAB — PROTIME-INR

## 2012-10-31 MED ORDER — SODIUM CHLORIDE 0.9 % IJ SOLN
10.0000 mL | INTRAMUSCULAR | Status: DC | PRN
  Administered 2012-10-31: 10 mL
  Filled 2012-10-31: qty 10

## 2012-10-31 MED ORDER — SODIUM CHLORIDE 0.9 % IV SOLN
Freq: Once | INTRAVENOUS | Status: AC
  Administered 2012-10-31: 11:00:00 via INTRAVENOUS

## 2012-10-31 MED ORDER — SODIUM CHLORIDE 0.9 % IV SOLN
102.0000 mg | Freq: Once | INTRAVENOUS | Status: AC
  Administered 2012-10-31: 102 mg via INTRAVENOUS
  Filled 2012-10-31: qty 17

## 2012-10-31 MED ORDER — DEXAMETHASONE SODIUM PHOSPHATE 10 MG/ML IJ SOLN
10.0000 mg | Freq: Once | INTRAMUSCULAR | Status: AC
  Administered 2012-10-31: 10 mg via INTRAVENOUS

## 2012-10-31 MED ORDER — PACLITAXEL CHEMO INJECTION 300 MG/50ML: 65.0000 mg/m2 | Freq: Once | INTRAVENOUS | Status: DC

## 2012-10-31 MED ORDER — HEPARIN SOD (PORK) LOCK FLUSH 100 UNIT/ML IV SOLN
500.0000 [IU] | Freq: Once | INTRAVENOUS | Status: AC | PRN
  Administered 2012-10-31: 500 [IU]
  Filled 2012-10-31: qty 5

## 2012-10-31 MED ORDER — ONDANSETRON 8 MG/50ML IVPB (CHCC)
8.0000 mg | Freq: Once | INTRAVENOUS | Status: AC
  Administered 2012-10-31: 8 mg via INTRAVENOUS

## 2012-10-31 MED ORDER — DIPHENHYDRAMINE HCL 50 MG/ML IJ SOLN
25.0000 mg | Freq: Once | INTRAMUSCULAR | Status: AC
  Administered 2012-10-31: 25 mg via INTRAVENOUS

## 2012-10-31 MED ORDER — FAMOTIDINE IN NACL 20-0.9 MG/50ML-% IV SOLN
20.0000 mg | Freq: Once | INTRAVENOUS | Status: AC
  Administered 2012-10-31: 20 mg via INTRAVENOUS

## 2012-10-31 NOTE — Patient Instructions (Addendum)
[facility] Discharge Instructions for Patients Receiving Chemotherapy  Today you received the following chemotherapy agents :  Taxol.  To help prevent nausea and vomiting after your treatment, we encourage you to take your nausea medication as instructed by your physician.    If you develop nausea and vomiting that is not controlled by your nausea medication, call the clinic. If it is after clinic hours your family physician or the after hours number for the clinic or go to the Emergency Department.   BELOW ARE SYMPTOMS THAT SHOULD BE REPORTED IMMEDIATELY:  *FEVER GREATER THAN 100.5 F  *CHILLS WITH OR WITHOUT FEVER  NAUSEA AND VOMITING THAT IS NOT CONTROLLED WITH YOUR NAUSEA MEDICATION  *UNUSUAL SHORTNESS OF BREATH  *UNUSUAL BRUISING OR BLEEDING  TENDERNESS IN MOUTH AND THROAT WITH OR WITHOUT PRESENCE OF ULCERS  *URINARY PROBLEMS  *BOWEL PROBLEMS  UNUSUAL RASH Items with * indicate a potential emergency and should be followed up as soon as possible.  One of the nurses will contact you 24 hours after your treatment. Please let the nurse know about any problems that you may have experienced. Feel free to call the clinic you have any questions or concerns. The clinic phone number is [phone].   I have been informed and understand all the instructions given to me. I know to contact the clinic, my physician, or go to the Emergency Department if any problems should occur. I do not have any questions at this time, but understand that I may call the clinic during office hours   should I have any questions or need assistance in obtaining follow up care.    __________________________________________  _____________  __________ Signature of Patient or Authorized Representative            Date                   Time    __________________________________________ Nurse's Signature    

## 2012-11-01 ENCOUNTER — Telehealth: Payer: Self-pay | Admitting: *Deleted

## 2012-11-01 NOTE — Telephone Encounter (Signed)
Spoke with pt, she is contacting her cardiologist at [facility] to report her INR level. They manage her Coumadin.

## 2012-11-07 ENCOUNTER — Ambulatory Visit: Payer: Medicare Other

## 2012-11-07 ENCOUNTER — Other Ambulatory Visit: Payer: Medicare Other | Admitting: Lab

## 2012-11-07 ENCOUNTER — Telehealth: Payer: Self-pay | Admitting: Oncology

## 2012-11-07 ENCOUNTER — Telehealth: Payer: Self-pay | Admitting: *Deleted

## 2012-11-07 ENCOUNTER — Other Ambulatory Visit (HOSPITAL_BASED_OUTPATIENT_CLINIC_OR_DEPARTMENT_OTHER): Payer: Medicare Other | Admitting: Lab

## 2012-11-07 DIAGNOSIS — C569 Malignant neoplasm of unspecified ovary: Secondary | ICD-10-CM

## 2012-11-07 LAB — CBC WITH DIFFERENTIAL/PLATELET
BASO%: 0.6 % (ref 0.0–2.0)
Basophils Absolute: 0 10*3/uL (ref 0.0–0.1)
EOS%: 0.6 % (ref 0.0–7.0)
Eosinophils Absolute: 0 10*3/uL (ref 0.0–0.5)
HGB: 9.4 g/dL — ABNORMAL LOW (ref 11.6–15.9)
LYMPH%: 46.5 % (ref 14.0–49.7)
MCV: 93.9 fL (ref 79.5–101.0)
MONO%: 11 % (ref 0.0–14.0)
NEUT#: 0.6 10*3/uL — ABNORMAL LOW (ref 1.5–6.5)
Platelets: 116 10*3/uL — ABNORMAL LOW (ref 145–400)
RBC: 3.09 10*6/uL — ABNORMAL LOW (ref 3.70–5.45)

## 2012-11-07 NOTE — Progress Notes (Signed)
Labs reviewed by Dr. Truett Perna. Hold treatment today. Pt to go to scheduling, treatment r/s for following week.

## 2012-11-07 NOTE — Telephone Encounter (Signed)
Per staff message and POF I have scheduled appts.  JMW  

## 2012-11-13 ENCOUNTER — Encounter: Payer: Self-pay | Admitting: Lab

## 2012-11-14 ENCOUNTER — Other Ambulatory Visit (HOSPITAL_BASED_OUTPATIENT_CLINIC_OR_DEPARTMENT_OTHER): Payer: Medicare Other | Admitting: Lab

## 2012-11-14 ENCOUNTER — Other Ambulatory Visit: Payer: Self-pay | Admitting: Oncology

## 2012-11-14 ENCOUNTER — Ambulatory Visit (HOSPITAL_BASED_OUTPATIENT_CLINIC_OR_DEPARTMENT_OTHER): Payer: Medicare Other

## 2012-11-14 VITALS — BP 121/79 | HR 69 | Temp 98.0°F | Resp 18

## 2012-11-14 DIAGNOSIS — C569 Malignant neoplasm of unspecified ovary: Secondary | ICD-10-CM

## 2012-11-14 DIAGNOSIS — C785 Secondary malignant neoplasm of large intestine and rectum: Secondary | ICD-10-CM

## 2012-11-14 DIAGNOSIS — Z5111 Encounter for antineoplastic chemotherapy: Secondary | ICD-10-CM

## 2012-11-14 LAB — CBC WITH DIFFERENTIAL/PLATELET
BASO%: 0.8 % (ref 0.0–2.0)
EOS%: 0.4 % (ref 0.0–7.0)
Eosinophils Absolute: 0 10*3/uL (ref 0.0–0.5)
HCT: 31.7 % — ABNORMAL LOW (ref 34.8–46.6)
HGB: 10.3 g/dL — ABNORMAL LOW (ref 11.6–15.9)
MCH: 30.7 pg (ref 25.1–34.0)
MONO%: 17 % — ABNORMAL HIGH (ref 0.0–14.0)
Platelets: 130 10*3/uL — ABNORMAL LOW (ref 145–400)
RBC: 3.35 10*6/uL — ABNORMAL LOW (ref 3.70–5.45)
RDW: 16.3 % — ABNORMAL HIGH (ref 11.2–14.5)
WBC: 2.5 10*3/uL — ABNORMAL LOW (ref 3.9–10.3)

## 2012-11-14 MED ORDER — SODIUM CHLORIDE 0.9 % IV SOLN
65.0000 mg/m2 | Freq: Once | INTRAVENOUS | Status: AC
  Administered 2012-11-14: 102 mg via INTRAVENOUS
  Filled 2012-11-14: qty 17

## 2012-11-14 MED ORDER — DIPHENHYDRAMINE HCL 50 MG/ML IJ SOLN
25.0000 mg | Freq: Once | INTRAMUSCULAR | Status: AC
  Administered 2012-11-14: 25 mg via INTRAVENOUS

## 2012-11-14 MED ORDER — FAMOTIDINE IN NACL 20-0.9 MG/50ML-% IV SOLN
20.0000 mg | Freq: Once | INTRAVENOUS | Status: AC
  Administered 2012-11-14: 20 mg via INTRAVENOUS

## 2012-11-14 MED ORDER — ONDANSETRON 8 MG/50ML IVPB (CHCC)
8.0000 mg | Freq: Once | INTRAVENOUS | Status: AC
  Administered 2012-11-14: 8 mg via INTRAVENOUS

## 2012-11-14 MED ORDER — SODIUM CHLORIDE 0.9 % IJ SOLN
10.0000 mL | INTRAMUSCULAR | Status: DC | PRN
  Administered 2012-11-14: 10 mL
  Filled 2012-11-14: qty 10

## 2012-11-14 MED ORDER — HEPARIN SOD (PORK) LOCK FLUSH 100 UNIT/ML IV SOLN
500.0000 [IU] | Freq: Once | INTRAVENOUS | Status: AC | PRN
  Administered 2012-11-14: 500 [IU]
  Filled 2012-11-14: qty 5

## 2012-11-14 MED ORDER — SODIUM CHLORIDE 0.9 % IV SOLN
Freq: Once | INTRAVENOUS | Status: AC
  Administered 2012-11-14: 10:00:00 via INTRAVENOUS

## 2012-11-14 MED ORDER — DEXAMETHASONE SODIUM PHOSPHATE 10 MG/ML IJ SOLN
10.0000 mg | Freq: Once | INTRAMUSCULAR | Status: AC
  Administered 2012-11-14: 10 mg via INTRAVENOUS

## 2012-11-14 NOTE — Progress Notes (Signed)
Dr Truett Perna reviewed labs, ok to tx.  dmr

## 2012-11-14 NOTE — Patient Instructions (Addendum)
Paclitaxel injection  What is this medicine?  PACLITAXEL (PAK li TAX el) is a chemotherapy drug. It targets fast dividing cells, like cancer cells, and causes these cells to die. This medicine is used to treat ovarian cancer, breast cancer, and other cancers.  This medicine may be used for other purposes; ask your health care provider or pharmacist if you have questions.  What should I tell my health care provider before I take this medicine?  They need to know if you have any of these conditions:  -blood disorders  -irregular heartbeat  -infection (especially a virus infection such as chickenpox, cold sores, or herpes)  -liver disease  -previous or ongoing radiation therapy  -an unusual or allergic reaction to paclitaxel, alcohol, polyoxyethylated castor oil, other chemotherapy agents, other medicines, foods, dyes, or preservatives  -pregnant or trying to get pregnant  -breast-feeding  How should I use this medicine?  This drug is given as an infusion into a vein. It is administered in a hospital or clinic by a specially trained health care professional.  Talk to your pediatrician regarding the use of this medicine in children. Special care may be needed.  Overdosage: If you think you have taken too much of this medicine contact a poison control center or emergency room at once.  NOTE: This medicine is only for you. Do not share this medicine with others.  What if I miss a dose?  It is important not to miss your dose. Call your doctor or health care professional if you are unable to keep an appointment.  What may interact with this medicine?  Do not take this medicine with any of the following medications:  -disulfiram  -metronidazole  This medicine may also interact with the following medications:  -cyclosporine  -dexamethasone  -diazepam  -ketoconazole  -medicines to increase blood counts like filgrastim, pegfilgrastim, sargramostim  -other chemotherapy drugs like cisplatin, doxorubicin, epirubicin, etoposide,  teniposide, vincristine  -quinidine  -testosterone  -vaccines  -verapamil  Talk to your doctor or health care professional before taking any of these medicines:  -acetaminophen  -aspirin  -ibuprofen  -ketoprofen  -naproxen  This list may not describe all possible interactions. Give your health care provider a list of all the medicines, herbs, non-prescription drugs, or dietary supplements you use. Also tell them if you smoke, drink alcohol, or use illegal drugs. Some items may interact with your medicine.  What should I watch for while using this medicine?  Your condition will be monitored carefully while you are receiving this medicine. You will need important blood work done while you are taking this medicine.  This drug may make you feel generally unwell. This is not uncommon, as chemotherapy can affect healthy cells as well as cancer cells. Report any side effects. Continue your course of treatment even though you feel ill unless your doctor tells you to stop.  In some cases, you may be given additional medicines to help with side effects. Follow all directions for their use.  Call your doctor or health care professional for advice if you get a fever, chills or sore throat, or other symptoms of a cold or flu. Do not treat yourself. This drug decreases your body's ability to fight infections. Try to avoid being around people who are sick.  This medicine may increase your risk to bruise or bleed. Call your doctor or health care professional if you notice any unusual bleeding.  Be careful brushing and flossing your teeth or using a toothpick because you   may get an infection or bleed more easily. If you have any dental work done, tell your dentist you are receiving this medicine.  Avoid taking products that contain aspirin, acetaminophen, ibuprofen, naproxen, or ketoprofen unless instructed by your doctor. These medicines may hide a fever.  Do not become pregnant while taking this medicine. Women should inform their  doctor if they wish to become pregnant or think they might be pregnant. There is a potential for serious side effects to an unborn child. Talk to your health care professional or pharmacist for more information. Do not breast-feed an infant while taking this medicine.  Men are advised not to father a child while receiving this medicine.  What side effects may I notice from receiving this medicine?  Side effects that you should report to your doctor or health care professional as soon as possible:  -allergic reactions like skin rash, itching or hives, swelling of the face, lips, or tongue  -low blood counts - This drug may decrease the number of white blood cells, red blood cells and platelets. You may be at increased risk for infections and bleeding.  -signs of infection - fever or chills, cough, sore throat, pain or difficulty passing urine  -signs of decreased platelets or bleeding - bruising, pinpoint red spots on the skin, black, tarry stools, nosebleeds  -signs of decreased red blood cells - unusually weak or tired, fainting spells, lightheadedness  -breathing problems  -chest pain  -high or low blood pressure  -mouth sores  -nausea and vomiting  -pain, swelling, redness or irritation at the injection site  -pain, tingling, numbness in the hands or feet  -slow or irregular heartbeat  -swelling of the ankle, feet, hands  Side effects that usually do not require medical attention (report to your doctor or health care professional if they continue or are bothersome):  -bone pain  -complete hair loss including hair on your head, underarms, pubic hair, eyebrows, and eyelashes  -changes in the color of fingernails  -diarrhea  -loosening of the fingernails  -loss of appetite  -muscle or joint pain  -red flush to skin  -sweating  This list may not describe all possible side effects. Call your doctor for medical advice about side effects. You may report side effects to FDA at 1-800-FDA-1088.  Where should I keep my  medicine?  This drug is given in a hospital or clinic and will not be stored at home.  NOTE: This sheet is a summary. It may not cover all possible information. If you have questions about this medicine, talk to your doctor, pharmacist, or health care provider.   2013, [employer]. (06/24/2008 11:54:26 AM)

## 2012-11-19 ENCOUNTER — Other Ambulatory Visit: Payer: Self-pay | Admitting: Oncology

## 2012-11-21 ENCOUNTER — Ambulatory Visit (HOSPITAL_BASED_OUTPATIENT_CLINIC_OR_DEPARTMENT_OTHER): Payer: Medicare Other

## 2012-11-21 ENCOUNTER — Ambulatory Visit (HOSPITAL_BASED_OUTPATIENT_CLINIC_OR_DEPARTMENT_OTHER): Payer: Medicare Other | Admitting: Nurse Practitioner

## 2012-11-21 ENCOUNTER — Telehealth: Payer: Self-pay | Admitting: Oncology

## 2012-11-21 ENCOUNTER — Other Ambulatory Visit (HOSPITAL_BASED_OUTPATIENT_CLINIC_OR_DEPARTMENT_OTHER): Payer: Medicare Other | Admitting: Lab

## 2012-11-21 VITALS — BP 123/70 | HR 65 | Temp 96.9°F | Resp 18 | Ht 61.0 in | Wt 128.0 lb

## 2012-11-21 DIAGNOSIS — C569 Malignant neoplasm of unspecified ovary: Secondary | ICD-10-CM

## 2012-11-21 DIAGNOSIS — Z5111 Encounter for antineoplastic chemotherapy: Secondary | ICD-10-CM

## 2012-11-21 DIAGNOSIS — C785 Secondary malignant neoplasm of large intestine and rectum: Secondary | ICD-10-CM

## 2012-11-21 DIAGNOSIS — Z7901 Long term (current) use of anticoagulants: Secondary | ICD-10-CM

## 2012-11-21 DIAGNOSIS — I4891 Unspecified atrial fibrillation: Secondary | ICD-10-CM

## 2012-11-21 LAB — CBC WITH DIFFERENTIAL/PLATELET
BASO%: 0.9 % (ref 0.0–2.0)
Basophils Absolute: 0 10*3/uL (ref 0.0–0.1)
EOS%: 1.2 % (ref 0.0–7.0)
HCT: 28.9 % — ABNORMAL LOW (ref 34.8–46.6)
HGB: 9.4 g/dL — ABNORMAL LOW (ref 11.6–15.9)
MCH: 30.5 pg (ref 25.1–34.0)
MCHC: 32.5 g/dL (ref 31.5–36.0)
MCV: 93.8 fL (ref 79.5–101.0)
MONO%: 5.3 % (ref 0.0–14.0)
NEUT%: 61.8 % (ref 38.4–76.8)
RDW: 16.7 % — ABNORMAL HIGH (ref 11.2–14.5)
lymph#: 1 10*3/uL (ref 0.9–3.3)

## 2012-11-21 LAB — BASIC METABOLIC PANEL (CC13)
BUN: 13.3 mg/dL (ref 7.0–26.0)
Calcium: 8.7 mg/dL (ref 8.4–10.4)
Chloride: 103 mEq/L (ref 98–107)
Creatinine: 0.8 mg/dL (ref 0.6–1.1)

## 2012-11-21 LAB — PROTIME-INR: INR: 2 (ref 2.00–3.50)

## 2012-11-21 MED ORDER — SODIUM CHLORIDE 0.9 % IV SOLN
Freq: Once | INTRAVENOUS | Status: AC
  Administered 2012-11-21: 11:00:00 via INTRAVENOUS

## 2012-11-21 MED ORDER — HEPARIN SOD (PORK) LOCK FLUSH 100 UNIT/ML IV SOLN
500.0000 [IU] | Freq: Once | INTRAVENOUS | Status: AC | PRN
  Administered 2012-11-21: 500 [IU]
  Filled 2012-11-21: qty 5

## 2012-11-21 MED ORDER — SODIUM CHLORIDE 0.9 % IV SOLN
65.0000 mg/m2 | Freq: Once | INTRAVENOUS | Status: AC
  Administered 2012-11-21: 102 mg via INTRAVENOUS
  Filled 2012-11-21: qty 17

## 2012-11-21 MED ORDER — DEXAMETHASONE SODIUM PHOSPHATE 10 MG/ML IJ SOLN
10.0000 mg | Freq: Once | INTRAMUSCULAR | Status: AC
  Administered 2012-11-21: 10 mg via INTRAVENOUS

## 2012-11-21 MED ORDER — SODIUM CHLORIDE 0.9 % IJ SOLN
10.0000 mL | INTRAMUSCULAR | Status: DC | PRN
  Administered 2012-11-21: 10 mL
  Filled 2012-11-21: qty 10

## 2012-11-21 MED ORDER — DIPHENHYDRAMINE HCL 50 MG/ML IJ SOLN
25.0000 mg | Freq: Once | INTRAMUSCULAR | Status: AC
  Administered 2012-11-21: 25 mg via INTRAVENOUS

## 2012-11-21 MED ORDER — ONDANSETRON 16 MG/50ML IVPB (CHCC)
16.0000 mg | Freq: Once | INTRAVENOUS | Status: AC
  Administered 2012-11-21: 16 mg via INTRAVENOUS

## 2012-11-21 MED ORDER — SODIUM CHLORIDE 0.9 % IV SOLN
365.0000 mg | Freq: Once | INTRAVENOUS | Status: AC
  Administered 2012-11-21: 370 mg via INTRAVENOUS
  Filled 2012-11-21: qty 37

## 2012-11-21 MED ORDER — PACLITAXEL CHEMO INJECTION 300 MG/50ML: 65.0000 mg/m2 | Freq: Once | INTRAVENOUS | Status: DC

## 2012-11-21 MED ORDER — FAMOTIDINE IN NACL 20-0.9 MG/50ML-% IV SOLN
20.0000 mg | Freq: Once | INTRAVENOUS | Status: AC
  Administered 2012-11-21: 20 mg via INTRAVENOUS

## 2012-11-21 NOTE — Progress Notes (Signed)
OFFICE PROGRESS NOTE  Interval history:  Victoria [name] returns as scheduled. She completed cycle 5 day 15 Taxol/carboplatin on 11/14/2012. She denies nausea/vomiting. No mouth sores. No diarrhea. No abdominal pain. She denies neuropathy symptoms. She has a good appetite and good energy level.   Objective: Blood pressure 123/70, pulse 65, temperature 96.9 F (36.1 C), temperature source Oral, resp. rate 18, height 5\' 1"  (1.549 m), weight 128 lb (58.06 kg).  No thrush or ulceration. No palpable cervical or supraclavicular lymph nodes. Lungs are clear. Regular cardiac rhythm. Port-A-Cath site is without erythema. Abdomen is soft and nontender. No hepatomegaly. Left lower quadrant colostomy. Extremities without edema. Mild decrease in vibratory sense at the fingertips per tuning fork exam.  Lab Results: Lab Results  Component Value Date   WBC 3.2* 11/21/2012   HGB 9.4* 11/21/2012   HCT 28.9* 11/21/2012   MCV 93.8 11/21/2012   PLT 224 11/21/2012    Chemistry:    Chemistry      Component Value Date/Time   NA 133* 10/31/2012 0939   NA 132* 10/12/2012 1740   K 4.4 10/31/2012 0939   K 3.7 10/12/2012 1740   CL 102 10/31/2012 0939   CL 96 10/12/2012 1740   CO2 24 10/31/2012 0939   CO2 26 10/12/2012 1740   BUN 11.4 10/31/2012 0939   BUN 6 10/12/2012 1740   CREATININE 0.8 10/31/2012 0939   CREATININE 0.71 10/12/2012 1740      Component Value Date/Time   CALCIUM 8.9 10/31/2012 0939   CALCIUM 9.1 10/12/2012 1740   ALKPHOS 89 10/24/2012 0840   ALKPHOS 100 10/12/2012 1740   AST 17 10/24/2012 0840   AST 16 10/12/2012 1740   ALT 8 10/24/2012 0840   ALT 9 10/12/2012 1740   BILITOT 0.29 10/24/2012 0840   BILITOT 0.2* 10/12/2012 1740       Studies/Results: No results found.  Medications: I have reviewed the patient's current medications.  Assessment/Plan:  1. Ovarian cancer-presenting with a rectal mass , status post sigmoidoscopy 03/13/2012 with findings of a 5 cm malignant appearing mass with central ulceration in  the rectum 2-3 cm proximal to the anal verge. Pathology showed moderate to poorly differentiated adenocarcinoma with ulceration and prolapse changes. By immunohistochemistry the malignant cells were positive for cytokeratin 7, estrogen receptor and WT-1. They were negative for cytokeratin 20 and CDX-2. This immunohistochemical profile was strongly suggestive of a gynecologic primary. -Status post 3 cycles of Taxol/carboplatin chemotherapy with normalization of the CA 125, restaging CT 08/07/2012 with marked improvement in the pelvic masses and no evidence of progressive ovarian cancer. She completed cycle 4 of Taxol/carboplatin beginning 08/08/2012. 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 She completed cycle 5 Taxol/carboplatin beginning 10/24/2012. 2. CT abdomen/pelvis on 03/02/2012 with a 6.7 cm mass along the right aspect of the rectum; 2 adjacent nodal masses in the right mid abdomen measuring 8.9 x 7.4 x 7.2 cm and an additional 3.0 x 3.0 x 2.6 cm nodal mass posterior to the cecum. 3. PET scan 03/22/2012 with an intensely hypermetabolic large mass along the right aspect of the rectum; an intensely hypermetabolic right iliac fossa mass; increased caliber of small bowel loops containing air-fluid levels. 4. Hospitalization with nausea/vomiting, abdominal distention due to a small bowel obstruction status post bypass procedure and descending loop colostomy, and gastrostomy tube placement 04/04/2012. The gastrostomy tube has been removed. 5. Atrial fibrillation maintained  on Coumadin.  6. Status post Port-A-Cath placement 05/04/2012. 7. History of neutropenia secondary to chemotherapy causing a treatment delay following cycle 2. The Taxol was dose reduced beginning with cycle 3. 8. 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  Disposition-Ms. Stead appears stable. Plan to proceed with cycle 6 day 1 Taxol/carboplatin today as scheduled. She will return for the day 8 treatment on 11/28/2012 and the day 15 treatment on 12/05/2012. Dr. Truett Perna will see her in followup in 6 weeks with a CA 125. She will contact the office in the interim with any problems.  Plan reviewed with Dr. Truett Perna.        Lonna Cobb ANP/GNP-BC

## 2012-11-21 NOTE — Patient Instructions (Signed)
[facility] Discharge Instructions for Patients Receiving Chemotherapy  Today you received the following chemotherapy agents :  Taxol,  Carboplatin.  To help prevent nausea and vomiting after your treatment, we encourage you to take your nausea medication as instructed by your physician.    If you develop nausea and vomiting that is not controlled by your nausea medication, call the clinic. If it is after clinic hours your family physician or the after hours number for the clinic or go to the Emergency Department.   BELOW ARE SYMPTOMS THAT SHOULD BE REPORTED IMMEDIATELY:  *FEVER GREATER THAN 100.5 F  *CHILLS WITH OR WITHOUT FEVER  NAUSEA AND VOMITING THAT IS NOT CONTROLLED WITH YOUR NAUSEA MEDICATION  *UNUSUAL SHORTNESS OF BREATH  *UNUSUAL BRUISING OR BLEEDING  TENDERNESS IN MOUTH AND THROAT WITH OR WITHOUT PRESENCE OF ULCERS  *URINARY PROBLEMS  *BOWEL PROBLEMS  UNUSUAL RASH Items with * indicate a potential emergency and should be followed up as soon as possible.  One of the nurses will contact you 24 hours after your treatment. Please let the nurse know about any problems that you may have experienced. Feel free to call the clinic you have any questions or concerns. The clinic phone number is [phone].   I have been informed and understand all the instructions given to me. I know to contact the clinic, my physician, or go to the Emergency Department if any problems should occur. I do not have any questions at this time, but understand that I may call the clinic during office hours   should I have any questions or need assistance in obtaining follow up care.    __________________________________________  _____________  __________ Signature of Patient or Authorized Representative            Date                   Time    __________________________________________ Nurse's Signature    

## 2012-11-24 ENCOUNTER — Encounter: Payer: Self-pay | Admitting: *Deleted

## 2012-11-28 ENCOUNTER — Ambulatory Visit (HOSPITAL_BASED_OUTPATIENT_CLINIC_OR_DEPARTMENT_OTHER): Payer: Medicare Other

## 2012-11-28 ENCOUNTER — Other Ambulatory Visit: Payer: Self-pay | Admitting: Medical Oncology

## 2012-11-28 ENCOUNTER — Other Ambulatory Visit: Payer: Self-pay | Admitting: Oncology

## 2012-11-28 ENCOUNTER — Other Ambulatory Visit (HOSPITAL_BASED_OUTPATIENT_CLINIC_OR_DEPARTMENT_OTHER): Payer: Medicare Other | Admitting: Lab

## 2012-11-28 DIAGNOSIS — Z5111 Encounter for antineoplastic chemotherapy: Secondary | ICD-10-CM

## 2012-11-28 DIAGNOSIS — C20 Malignant neoplasm of rectum: Secondary | ICD-10-CM

## 2012-11-28 DIAGNOSIS — C569 Malignant neoplasm of unspecified ovary: Secondary | ICD-10-CM

## 2012-11-28 DIAGNOSIS — C785 Secondary malignant neoplasm of large intestine and rectum: Secondary | ICD-10-CM

## 2012-11-28 LAB — CBC WITH DIFFERENTIAL/PLATELET
BASO%: 0.9 % (ref 0.0–2.0)
EOS%: 0.4 % (ref 0.0–7.0)
MCH: 30.2 pg (ref 25.1–34.0)
MCHC: 32.4 g/dL (ref 31.5–36.0)
MCV: 93.3 fL (ref 79.5–101.0)
MONO%: 6.2 % (ref 0.0–14.0)
RDW: 16.7 % — ABNORMAL HIGH (ref 11.2–14.5)
lymph#: 0.7 10*3/uL — ABNORMAL LOW (ref 0.9–3.3)

## 2012-11-28 MED ORDER — DIPHENHYDRAMINE HCL 50 MG/ML IJ SOLN
25.0000 mg | Freq: Once | INTRAMUSCULAR | Status: AC
  Administered 2012-11-28: 25 mg via INTRAVENOUS

## 2012-11-28 MED ORDER — SODIUM CHLORIDE 0.9 % IV SOLN
Freq: Once | INTRAVENOUS | Status: AC
  Administered 2012-11-28: 10:00:00 via INTRAVENOUS

## 2012-11-28 MED ORDER — ONDANSETRON 8 MG/50ML IVPB (CHCC)
8.0000 mg | Freq: Once | INTRAVENOUS | Status: AC
  Administered 2012-11-28: 8 mg via INTRAVENOUS

## 2012-11-28 MED ORDER — SODIUM CHLORIDE 0.9 % IJ SOLN
10.0000 mL | INTRAMUSCULAR | Status: DC | PRN
  Administered 2012-11-28: 10 mL
  Filled 2012-11-28: qty 10

## 2012-11-28 MED ORDER — DEXAMETHASONE SODIUM PHOSPHATE 10 MG/ML IJ SOLN
10.0000 mg | Freq: Once | INTRAMUSCULAR | Status: AC
  Administered 2012-11-28: 10 mg via INTRAVENOUS

## 2012-11-28 MED ORDER — SODIUM CHLORIDE 0.9 % IV SOLN
65.0000 mg/m2 | Freq: Once | INTRAVENOUS | Status: AC
  Administered 2012-11-28: 102 mg via INTRAVENOUS
  Filled 2012-11-28: qty 17

## 2012-11-28 MED ORDER — FAMOTIDINE IN NACL 20-0.9 MG/50ML-% IV SOLN
20.0000 mg | Freq: Once | INTRAVENOUS | Status: AC
  Administered 2012-11-28: 20 mg via INTRAVENOUS

## 2012-11-28 MED ORDER — HEPARIN SOD (PORK) LOCK FLUSH 100 UNIT/ML IV SOLN
500.0000 [IU] | Freq: Once | INTRAVENOUS | Status: AC | PRN
  Administered 2012-11-28: 500 [IU]
  Filled 2012-11-28: qty 5

## 2012-11-28 NOTE — Patient Instructions (Addendum)
[facility] Discharge Instructions for Patients Receiving Chemotherapy  Today you received the following chemotherapy agents Taxol  To help prevent nausea and vomiting after your treatment, we encourage you to take your nausea medication    If you develop nausea and vomiting that is not controlled by your nausea medication, call the clinic.   BELOW ARE SYMPTOMS THAT SHOULD BE REPORTED IMMEDIATELY:  *FEVER GREATER THAN 100.5 F  *CHILLS WITH OR WITHOUT FEVER  NAUSEA AND VOMITING THAT IS NOT CONTROLLED WITH YOUR NAUSEA MEDICATION  *UNUSUAL SHORTNESS OF BREATH  *UNUSUAL BRUISING OR BLEEDING  TENDERNESS IN MOUTH AND THROAT WITH OR WITHOUT PRESENCE OF ULCERS  *URINARY PROBLEMS  *BOWEL PROBLEMS  UNUSUAL RASH Items with * indicate a potential emergency and should be followed up as soon as possible.  Feel free to call the clinic you have any questions or concerns. The clinic phone number is [phone].    

## 2012-11-28 NOTE — Progress Notes (Signed)
Per Dr. Truett Perna- pt ok to treat with ANC 1.4.

## 2012-12-05 ENCOUNTER — Other Ambulatory Visit: Payer: Self-pay | Admitting: Medical Oncology

## 2012-12-05 ENCOUNTER — Telehealth: Payer: Self-pay | Admitting: *Deleted

## 2012-12-05 ENCOUNTER — Other Ambulatory Visit (HOSPITAL_BASED_OUTPATIENT_CLINIC_OR_DEPARTMENT_OTHER): Payer: Medicare Other | Admitting: Lab

## 2012-12-05 ENCOUNTER — Ambulatory Visit: Payer: Medicare Other

## 2012-12-05 DIAGNOSIS — Z7901 Long term (current) use of anticoagulants: Secondary | ICD-10-CM

## 2012-12-05 DIAGNOSIS — C20 Malignant neoplasm of rectum: Secondary | ICD-10-CM

## 2012-12-05 LAB — PROTIME-INR: INR: 2.2 (ref 2.00–3.50)

## 2012-12-05 LAB — CBC WITH DIFFERENTIAL/PLATELET
BASO%: 0.6 % (ref 0.0–2.0)
EOS%: 0.6 % (ref 0.0–7.0)
HCT: 28 % — ABNORMAL LOW (ref 34.8–46.6)
MCH: 30.7 pg (ref 25.1–34.0)
MCHC: 32.9 g/dL (ref 31.5–36.0)
MONO#: 0.3 10*3/uL (ref 0.1–0.9)
RBC: 3 10*6/uL — ABNORMAL LOW (ref 3.70–5.45)
RDW: 17.3 % — ABNORMAL HIGH (ref 11.2–14.5)
WBC: 1.7 10*3/uL — ABNORMAL LOW (ref 3.9–10.3)
lymph#: 0.9 10*3/uL (ref 0.9–3.3)
nRBC: 3 % — ABNORMAL HIGH (ref 0–0)

## 2012-12-05 NOTE — Telephone Encounter (Signed)
Made patient aware of low white count and precautions. Will cancel last treatment per Dr. Truett Perna. Continue same coumadin and have cardiology take over monitoring again. Routed copy of PT/INR to Dr. Rachelle Hora Croitoru via in basket with request for cardiology to take over.

## 2012-12-05 NOTE — Progress Notes (Signed)
Per Dr Truett Perna, no treatment today d/t ANC @ 0.5. Patient informed. Educated patient on neutropenic precautions. Pt verbalized understanding.  Also informed patient, per Dr Kalman Drape nurse, for pt to stay on current dose of coumadin. Patient verbalized understanding. No further questions at this time, encouraged patient to call MD with any questions or concerns.

## 2013-01-02 ENCOUNTER — Other Ambulatory Visit (HOSPITAL_BASED_OUTPATIENT_CLINIC_OR_DEPARTMENT_OTHER): Payer: Medicare Other | Admitting: Lab

## 2013-01-02 ENCOUNTER — Ambulatory Visit (HOSPITAL_BASED_OUTPATIENT_CLINIC_OR_DEPARTMENT_OTHER): Payer: Medicare Other | Admitting: Oncology

## 2013-01-02 ENCOUNTER — Telehealth: Payer: Self-pay | Admitting: Oncology

## 2013-01-02 VITALS — BP 137/61 | HR 60 | Temp 97.4°F | Resp 18 | Ht 61.0 in | Wt 128.0 lb

## 2013-01-02 DIAGNOSIS — C569 Malignant neoplasm of unspecified ovary: Secondary | ICD-10-CM

## 2013-01-02 LAB — CBC WITH DIFFERENTIAL/PLATELET
BASO%: 0.8 % (ref 0.0–2.0)
Eosinophils Absolute: 0 10*3/uL (ref 0.0–0.5)
MCHC: 34.2 g/dL (ref 31.5–36.0)
MONO#: 0.5 10*3/uL (ref 0.1–0.9)
MONO%: 14 % (ref 0.0–14.0)
NEUT#: 2.2 10*3/uL (ref 1.5–6.5)
RBC: 3.29 10*6/uL — ABNORMAL LOW (ref 3.70–5.45)
RDW: 20.3 % — ABNORMAL HIGH (ref 11.2–14.5)
WBC: 3.7 10*3/uL — ABNORMAL LOW (ref 3.9–10.3)

## 2013-01-02 LAB — CA 125: CA 125: 9.5 U/mL (ref 0.0–30.2)

## 2013-01-02 MED ORDER — SODIUM CHLORIDE 0.9 % IJ SOLN
10.0000 mL | INTRAMUSCULAR | Status: DC | PRN
  Administered 2013-01-02: 10 mL via INTRAVENOUS
  Filled 2013-01-02: qty 10

## 2013-01-02 MED ORDER — HEPARIN SOD (PORK) LOCK FLUSH 100 UNIT/ML IV SOLN
500.0000 [IU] | Freq: Once | INTRAVENOUS | Status: AC
  Administered 2013-01-02: 500 [IU] via INTRAVENOUS
  Filled 2013-01-02: qty 5

## 2013-01-02 NOTE — Telephone Encounter (Signed)
gv pt appt schedule/AVS for July/September.

## 2013-01-02 NOTE — Progress Notes (Signed)
   [facility]    OFFICE PROGRESS NOTE   INTERVAL HISTORY:   She returns as scheduled. She feels well. Good appetite and energy level. The colostomy is functioning well. No pain.  Objective:  Vital signs in last 24 hours:  Blood pressure 137/61, pulse 60, temperature 97.4 F (36.3 C), temperature source Oral, resp. rate 18, height 5\' 1"  (1.549 m), weight 128 lb (58.06 kg).    HEENT: neck without mass Lymphatics: no cervical, supra-clavicular, axillary, or inguinal nodes Resp: decreased breath sounds over the right chest, no respiratory distress Cardio: regular rate and rhythm GI: no hepatosplenomegaly, no apparent ascites, no mass, left lower quadrant colostomy Vascular: no leg edema   Portacath/PICC-without erythema  Lab Results:  Lab Results  Component Value Date   WBC 3.7* 01/02/2013   HGB 10.6* 01/02/2013   HCT 31.0* 01/02/2013   MCV 94.3 01/02/2013   PLT 196 01/02/2013   ANC 2.2   Medications: I have reviewed the patient's current medications.  Assessment/Plan: 1. Ovarian cancer-presenting with a rectal mass , status post sigmoidoscopy 03/13/2012 with findings of a 5 cm malignant appearing mass with central ulceration in the rectum 2-3 cm proximal to the anal verge. Pathology showed moderate to poorly differentiated adenocarcinoma with ulceration and prolapse changes. By immunohistochemistry the malignant cells were positive for cytokeratin 7, estrogen receptor and WT-1. They were negative for cytokeratin 20 and CDX-2. This immunohistochemical profile was strongly suggestive of a gynecologic primary. -Status post 3 cycles of Taxol/carboplatin chemotherapy with normalization of the CA 125, restaging CT 08/07/2012 with marked improvement in the pelvic masses and no evidence of progressive ovarian cancer. She completed cycle 4 of Taxol/carboplatin beginning 08/08/2012. 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 She completed cycle 6 Taxol/carboplatin beginning 11/21/2012. 2. CT abdomen/pelvis on 03/02/2012 with a 6.7 cm mass along the right aspect of the rectum; 2 adjacent nodal masses in the right mid abdomen measuring 8.9 x 7.4 x 7.2 cm and an additional 3.0 x 3.0 x 2.6 cm nodal mass posterior to the cecum. 3. PET scan 03/22/2012 with an intensely hypermetabolic large mass along the right aspect of the rectum; an intensely hypermetabolic right iliac fossa mass; increased caliber of small bowel loops containing air-fluid levels. 4. Hospitalization with nausea/vomiting, abdominal distention due to a small bowel obstruction status post bypass procedure and descending loop colostomy, and gastrostomy tube placement 04/04/2012. The gastrostomy tube has been removed. 5. Atrial fibrillation maintained on Coumadin.  6. Status post Port-A-Cath placement 05/04/2012. 7. History of neutropenia secondary to chemotherapy causing a treatment delay following cycle 2. The Taxol was dose reduced beginning with cycle 3. 8. 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  Disposition:  Ms. Kettlewell remains in clinical remission from  ovarian cancer. We will followup on the CA 125 from today. The Port-A-Cath was flushed today. She will return for a Port-A-Cath flush and CA 125 in 6 weeks. Ms. Laural Benes will return for an office visit in 3 months.   Thornton Papas, MD  01/02/2013  9:46 PM

## 2013-01-03 ENCOUNTER — Telehealth: Payer: Self-pay | Admitting: *Deleted

## 2013-01-03 NOTE — Telephone Encounter (Signed)
Left VM with normal result.

## 2013-01-03 NOTE — Telephone Encounter (Signed)
Message copied by Wandalee Ferdinand on Wed Jan 03, 2013 10:54 AM ------      Message from: Ladene Artist      Created: Tue Jan 02, 2013 10:04 PM       Please call patient, ca125 is normal ------

## 2013-01-07 ENCOUNTER — Encounter: Payer: Self-pay | Admitting: *Deleted

## 2013-01-08 ENCOUNTER — Encounter: Payer: Self-pay | Admitting: Cardiovascular Disease

## 2013-01-10 ENCOUNTER — Ambulatory Visit (INDEPENDENT_AMBULATORY_CARE_PROVIDER_SITE_OTHER): Payer: Medicare Other | Admitting: Pharmacist Clinician (PhC)/ Clinical Pharmacy Specialist

## 2013-01-10 ENCOUNTER — Encounter: Payer: Self-pay | Admitting: Cardiovascular Disease

## 2013-01-10 ENCOUNTER — Ambulatory Visit (INDEPENDENT_AMBULATORY_CARE_PROVIDER_SITE_OTHER): Payer: Medicare Other | Admitting: Cardiovascular Disease

## 2013-01-10 VITALS — BP 136/68 | HR 60 | Resp 20 | Ht 61.0 in | Wt 129.5 lb

## 2013-01-10 DIAGNOSIS — I480 Paroxysmal atrial fibrillation: Secondary | ICD-10-CM

## 2013-01-10 DIAGNOSIS — I4891 Unspecified atrial fibrillation: Secondary | ICD-10-CM

## 2013-01-10 DIAGNOSIS — Z7901 Long term (current) use of anticoagulants: Secondary | ICD-10-CM

## 2013-01-10 DIAGNOSIS — Z950 Presence of cardiac pacemaker: Secondary | ICD-10-CM

## 2013-01-10 DIAGNOSIS — I471 Supraventricular tachycardia: Secondary | ICD-10-CM

## 2013-01-10 DIAGNOSIS — I4719 Other supraventricular tachycardia: Secondary | ICD-10-CM | POA: Insufficient documentation

## 2013-01-10 LAB — PACEMAKER DEVICE OBSERVATION

## 2013-01-10 NOTE — Assessment & Plan Note (Signed)
[name] XL DR model [id number], serial number [id number] implanted February 2010. History of sinus node dysfunction but reportedly also second-degree atrioventricular block   Normal device function by check today. This may longevity between 2.5 and 4 years. Normal lead parameters. 2 significant episodes of atrial tachycardia lasting about 5 hours each. Heart rate roughly 150 beats per minute 1-1 atrioventricular conduction. No changes are made device settings today. There is 60% atrial pacing and less than 1% ventricular pacing. Please see the separate device check note.

## 2013-01-10 NOTE — Progress Notes (Signed)
Patient ID: Victoria [name], female   DOB: 1926/09/08, 79 y.o.   MRN: [id number]     Reason for office visit Atrial fibrillation, atrial tachycardia, pacemaker check  Victoria [name] is doing remarkably well and as always has an excellent disposition. She has completed chemotherapy for ovarian cancer and her biomarkers are within the normal range. She's currently considered "cancer free". She has a left colostomy that will likely be left in place. Dr. Dwain Sarna feels that the surgical risk of reanastomosis is not justified.  She had persistent and very rapid atrial fibrillation when she was ill. This was particularly a problem when she had small bowel obstruction and around the time of her surgery. Things have settled down nicely. She is now off the amiodarone. Despite this she has not had any recurrent atrial fibrillation since her device check. Her pacemaker does show 2 episodes of sustained paroxysmal atrial tachycardia with a rate of about 150 beats per minute. These episodes occurred on May 2 and May 8 respectively. No new episodes since that time. She remembers experiencing palpitations. At least on one occasion she remembers being "aggravated" by her husband. She did not have chest pain dyspnea or presyncope around the time of her palpitations.    Allergies  Allergen Reactions  . Pindolol Swelling    Throat swelled up  . Iodine Other (See Comments)    BLISTERS  . Shellfish Allergy   . Vibramycin (Doxycycline Calcium) Nausea Only  . Doxycycline Nausea Only  . Levofloxacin Other (See Comments)    DIZZINESS  . Multaq (Dronedarone)     CAUSED SEVERE BURNING GI TRACT    Current Outpatient Prescriptions  Medication Sig Dispense Refill  . diltiazem (CARDIZEM CD) 240 MG 24 hr capsule Take 240 mg by mouth every morning.      . docusate sodium (COLACE) 100 MG capsule Take 100 mg by mouth as needed.      . feeding supplement (ENSURE IMMUNE HEALTH) LIQD Take 237 mLs by mouth daily.       Marland Kitchen  lidocaine-prilocaine (EMLA) cream Apply topically as needed. Apply to [facility] site 1-2 hours prior to stick and cover with plastic wrap to numb site  30 g  prn  . metoprolol succinate (TOPROL-XL) 25 MG 24 hr tablet Take 25 mg by mouth every morning.       . ondansetron (ZOFRAN) 4 MG tablet Take 4 mg by mouth as needed for nausea.       . polyethylene glycol (MIRALAX / GLYCOLAX) packet Take 17 g by mouth as needed.      . simvastatin (ZOCOR) 20 MG tablet Take 20 mg by mouth every evening.      . warfarin (COUMADIN) 5 MG tablet Take 5 mg by mouth at bedtime.        No current facility-administered medications for this visit.   Facility-Administered Medications Ordered in Other Visits  Medication Dose Route Frequency Provider Last Rate Last Dose  . heparin 6,000 Units in sodium chloride irrigation 0.9 % 500 mL irrigation   Irrigation Once Emelia Loron, MD      . sodium chloride 0.9 % injection 10 mL  10 mL Intracatheter PRN Ladene Artist, MD   10 mL at 07/18/12 1248    Past Medical History  Diagnosis Date  . Atrial fibrillation   . Hyperlipidemia   . Rectal carcinoma   . Colon cancer 03/13/12 egd    rectal mass5cm  , 2-3cm proximal to anal verge  . Diverticulosis   .  Allergy   . Anxiety     rhematoid  . Sinus node dysfunction 09/10/2008    Pacemaker St.Jude  . Coronary artery disease   . Colostomy in place   . PONV (postoperative nausea and vomiting)   . Chronic anticoagulation   . Fluid collection (edema) in the arms, legs, hands and feet   . Urinary frequency     with burning x 2 days  . History of blood transfusion   . Hypertension   . RBBB     Past Surgical History  Procedure Laterality Date  . Permanent pacemaker insertion  09/10/2008    St.Jude  . Rib removed  1934    Pleurisy and pneumonia  . Partial hysterectomy  1972  . Bladder neck suspension  1986  . Heart catherization  [id number]  . Abdominal hysterectomy      both ovaries intact,   . Colon resection   04/04/2012    Procedure: COLON RESECTION;  Surgeon: Emelia Loron, MD;  Location: [facility];  Service: General;  Laterality: N/A;  laparotomy with small bowel resection for obstruction with colostomy with gastrostomy tube placement.   . Portacath placement  05/04/2012    Procedure: INSERTION PORT-A-CATH;  Surgeon: Emelia Loron, MD;  Location: [facility];  Service: General;  Laterality: Right;  . Insert / replace / remove pacemaker  2/10  . Laparotomy Bilateral 10/03/2012    Procedure: EXPLORATORY LAPAROTOMY;  Surgeon: Rejeana Brock A. Duard Brady, MD;  Location: [facility];  Service: Gynecology;  Laterality: Bilateral;  WITH BSO, TUMOR DEBULKING  . Colostomy revision N/A 10/03/2012    Procedure: COLOSTOMY REVISION;  Surgeon: Emelia Loron, MD;  Location: [facility];  Service: General;  Laterality: N/A;  COLOSTOMY REVISION, Sigmoid colectomy  . US echocardiography  07/17/2008    EF =>55%,mild mitral annular ca+,mild-mod MR,TR,AOV mildly sclerotic  . Nm myoview ltd  07/17/2008    no ischemia    Family History  Problem Relation Age of Onset  . Heart disease Mother     History   Social History  . Marital Status: Married    Spouse Name: N/A    Number of Children: 2  . Years of Education: N/A   Occupational History  . Retired     Armed forces operational officer,     Social History Main Topics  . Smoking status: Never Smoker   . Smokeless tobacco: Never Used  . Alcohol Use: No  . Drug Use: No  . Sexually Active: Not on file   Other Topics Concern  . Not on file   Social History Narrative  . No narrative on file    Review of systems: The patient specifically denies any chest pain at rest or with exertion, dyspnea at rest or with exertion, orthopnea, paroxysmal nocturnal dyspnea, syncope, focal neurological deficits, intermittent claudication, lower extremity edema, unexplained weight gain, cough, hemoptysis or wheezing.  The patient also denies abdominal pain, nausea, vomiting, dysphagia, polyuria, polydipsia,  dysuria, hematuria, frequency, urgency, abnormal bleeding or bruising, fever, chills, unexpected weight changes, mood swings, change in skin or hair texture, change in voice quality, auditory or visual problems, allergic reactions or rashes, new musculoskeletal complaints other than usual "aches and pains".   PHYSICAL EXAM BP 136/68  Pulse 60  Resp 20  Ht 5\' 1"  (1.549 m)  Wt 58.741 kg (129 lb 8 oz)  BMI 24.48 kg/m2  General: Alert, oriented x3, no distress; post chemotherapy alopecia Head: no evidence of trauma, PERRL, EOMI, no exophtalmos or lid lag, no myxedema, no xanthelasma;  normal ears, nose and oropharynx Neck: normal jugular venous pulsations and no hepatojugular reflux; brisk carotid pulses without delay and no carotid bruits Chest: clear to auscultation, no signs of consolidation by percussion or palpation, normal fremitus, symmetrical and full respiratory excursions; healthy left subclavian pacemaker site; healthy right subclavian Port-A-Cath site Cardiovascular: normal position and quality of the apical impulse, regular rhythm, normal first and widely split second heart sound, no murmurs, rubs or gallops Abdomen: no tenderness or distention, no masses by palpation, no abnormal pulsatility or arterial bruits, normal bowel sounds, no hepatosplenomegaly Extremities: no clubbing, cyanosis or edema; 2+ radial, ulnar and brachial pulses bilaterally; 2+ right femoral, posterior tibial and dorsalis pedis pulses; 2+ left femoral, posterior tibial and dorsalis pedis pulses; no subclavian or femoral bruits Neurological: grossly nonfocal   EKG: Atrial paced ventricular sensed, chronic right bundle branch block  Lipid Panel     Component Value Date/Time   CHOL 115 04/10/2012 0520   TRIG 80 04/10/2012 0520   HDL 43 05/15/2008 0620   CHOLHDL 3.3 05/15/2008 0620   VLDL 17 05/15/2008 0620   LDLCALC  Value: 82        Total Cholesterol/HDL:CHD Risk Coronary Heart Disease Risk Table                      Men   Women  1/2 Average Risk   3.4   3.3 05/15/2008 0620    BMET    Component Value Date/Time   NA 135* 11/21/2012 0920   NA 132* 10/12/2012 1740   K 4.3 11/21/2012 0920   K 3.7 10/12/2012 1740   CL 103 11/21/2012 0920   CL 96 10/12/2012 1740   CO2 25 11/21/2012 0920   CO2 26 10/12/2012 1740   GLUCOSE 101* 11/21/2012 0920   GLUCOSE 102* 10/12/2012 1740   BUN 13.3 11/21/2012 0920   BUN 6 10/12/2012 1740   CREATININE 0.8 11/21/2012 0920   CREATININE 0.71 10/12/2012 1740   CALCIUM 8.7 11/21/2012 0920   CALCIUM 9.1 10/12/2012 1740   GFRNONAA 78* 10/12/2012 1740   GFRAA 90* 10/12/2012 1740     ASSESSMENT AND PLAN PAT (paroxysmal atrial tachycardia) The atrial tachycardia symptomatic in the form of palpitations, but does not cause dyspnea or presyncope or chest pain. Her episodes were fairly lengthy but they occurred several weeks ago when she was still receiving chemotherapy. For the time being I do not see a reason to change her current treatment with beta blockers and calcium channel blockers. I do not think this arrhythmia justifies long-term amiodarone therapy.  PAF fib/flutter- Hx RFA 2010 None has been recorded since her last pacemaker check. She is on appropriate warfarin anticoagulation. There was a clear pattern of increased frequency of atrial fibrillation during her episode of bowel obstruction and in the perioperative period.  S/P [facility] Jude Pace Feb 2006 after failed RFA [name] XL DR model [id number], serial number [id number] implanted February 2010. History of sinus node dysfunction but reportedly also second-degree atrioventricular block   Normal device function by check today. This may longevity between 2.5 and 4 years. Normal lead parameters. 2 significant episodes of atrial tachycardia lasting about 5 hours each. Heart rate roughly 150 beats per minute 1-1 atrioventricular conduction. No changes are made device settings today. There is 60% atrial pacing and less than 1%  ventricular pacing. Please see the separate device check note.  Return for device clinic pacemaker check in 3 months. Return to see M.D. in  6 months Meds ordered this encounter  Medications  . docusate sodium (COLACE) 100 MG capsule    Sig: Take 100 mg by mouth as needed.  . polyethylene glycol (MIRALAX / GLYCOLAX) packet    Sig: Take 17 g by mouth as needed.    Victoria Silk, MD, [facility] [phone] office [phone] pager

## 2013-01-10 NOTE — Patient Instructions (Addendum)
Your physician recommends that you schedule a follow-up appointment in: 3 months device clinic and in 6 months with Dr. Royann Shivers Followup in Coumadin clinic in 4 weeks

## 2013-01-10 NOTE — Assessment & Plan Note (Signed)
The atrial tachycardia symptomatic in the form of palpitations, but does not cause dyspnea or presyncope or chest pain. Her episodes were fairly lengthy but they occurred several weeks ago when she was still receiving chemotherapy. For the time being I do not see a reason to change her current treatment with beta blockers and calcium channel blockers. I do not think this arrhythmia justifies long-term amiodarone therapy.

## 2013-01-10 NOTE — Progress Notes (Signed)
In clinic pacemaker interrogation. Normal device function. No changes made this session. 

## 2013-01-10 NOTE — Assessment & Plan Note (Signed)
None has been recorded since her last pacemaker check. She is on appropriate warfarin anticoagulation. There was a clear pattern of increased frequency of atrial fibrillation during her episode of bowel obstruction and in the perioperative period.

## 2013-02-06 ENCOUNTER — Encounter (INDEPENDENT_AMBULATORY_CARE_PROVIDER_SITE_OTHER): Payer: Self-pay | Admitting: General Surgery

## 2013-02-06 ENCOUNTER — Ambulatory Visit (INDEPENDENT_AMBULATORY_CARE_PROVIDER_SITE_OTHER): Payer: Medicare Other | Admitting: General Surgery

## 2013-02-06 ENCOUNTER — Encounter (INDEPENDENT_AMBULATORY_CARE_PROVIDER_SITE_OTHER): Payer: Self-pay

## 2013-02-06 VITALS — BP 100/64 | HR 104 | Resp 14 | Ht 61.0 in | Wt 130.2 lb

## 2013-02-06 DIAGNOSIS — C569 Malignant neoplasm of unspecified ovary: Secondary | ICD-10-CM

## 2013-02-06 NOTE — Progress Notes (Signed)
Subjective:     Patient ID: Victoria [name], female   DOB: 10-04-1926, 79 y.o.   MRN: [id number]  HPI This is an 79 year old female who I know well from ovarian cancer. She was obstructed I initially did a loop colostomy as well as an intestinal bypass. She was then treated with chemotherapy and returned to the operating room for her gynecologic procedure as well as a revision of her colostomy. She has now completed all of her therapy. She returns today doing well without any real significant complaints. She has some increased flatulence that we discussed taking some simethicone for. She is otherwise eating well and has been gaining some weight. She comes in today to ask about her Port-A-Cath being removed. Her skin is very thin over this and she is concerned about that.  Review of Systems     Objective:   Physical Exam Right chest wall with port in place, very thin skin overlying it Abdomen soft nontender colostomy pink and functional    Assessment:     Ovarian cancer Port      Plan:     Port removal  We discussed again the remaining with her colostomy. I discussed with her taking it down to what that would involve and she elected not to do that. We did discuss removing her port as her skin is very thin overlying this. I am concerned that this will come through her skin if we do not remove it. I discussed port removal as an outpatient. All the recommendations on her Coumadin which will need to be stopped prior to this as well.

## 2013-02-07 ENCOUNTER — Ambulatory Visit (INDEPENDENT_AMBULATORY_CARE_PROVIDER_SITE_OTHER): Payer: Medicare Other | Admitting: Pharmacist Clinician (PhC)/ Clinical Pharmacy Specialist

## 2013-02-07 ENCOUNTER — Telehealth: Payer: Self-pay | Admitting: *Deleted

## 2013-02-07 VITALS — BP 110/80 | HR 108

## 2013-02-07 DIAGNOSIS — I480 Paroxysmal atrial fibrillation: Secondary | ICD-10-CM

## 2013-02-07 DIAGNOSIS — I4891 Unspecified atrial fibrillation: Secondary | ICD-10-CM

## 2013-02-07 DIAGNOSIS — Z7901 Long term (current) use of anticoagulants: Secondary | ICD-10-CM

## 2013-02-07 LAB — POCT INR: INR: 1.8

## 2013-02-08 ENCOUNTER — Telehealth (INDEPENDENT_AMBULATORY_CARE_PROVIDER_SITE_OTHER): Payer: Self-pay

## 2013-02-08 ENCOUNTER — Other Ambulatory Visit: Payer: Self-pay | Admitting: *Deleted

## 2013-02-08 NOTE — Telephone Encounter (Signed)
Message copied by Ethlyn Gallery on Thu Feb 08, 2013  2:54 PM ------      Message from: Phillips Hay L.      Created: Thu Feb 08, 2013  2:14 PM       Hamilton Marinello            Please let Dr. Dwain Sarna know that we are fine to have Ravinder Consuegra stop her warfarin for 5 days for removal of the port.  If you have any questions please feel free to call/write back.            Thanks,       Belenda Cruise  ------

## 2013-02-08 NOTE — Telephone Encounter (Signed)
Called pt to notify her that it was ok for her to stop her Coumadin 5 days before surgery. The pt was told by the Coumadin clinic yesterday to stop her Coumadin on 02/15/13 before her surgery on 7/30. The pt understands.

## 2013-02-09 ENCOUNTER — Encounter (HOSPITAL_COMMUNITY): Payer: Self-pay | Admitting: Pharmacy Technician

## 2013-02-09 ENCOUNTER — Encounter (INDEPENDENT_AMBULATORY_CARE_PROVIDER_SITE_OTHER): Payer: Self-pay

## 2013-02-12 NOTE — Telephone Encounter (Signed)
No refills needed at this time

## 2013-02-13 ENCOUNTER — Encounter (HOSPITAL_COMMUNITY): Payer: Self-pay

## 2013-02-13 ENCOUNTER — Encounter (HOSPITAL_COMMUNITY)
Admission: RE | Admit: 2013-02-13 | Discharge: 2013-02-13 | Disposition: A | Discharge status: Home / Self Care | Payer: Medicare Other | Source: Ambulatory Visit | Attending: General Surgery | Admitting: General Surgery

## 2013-02-13 ENCOUNTER — Ambulatory Visit (HOSPITAL_BASED_OUTPATIENT_CLINIC_OR_DEPARTMENT_OTHER): Payer: Medicare Other

## 2013-02-13 ENCOUNTER — Other Ambulatory Visit (HOSPITAL_BASED_OUTPATIENT_CLINIC_OR_DEPARTMENT_OTHER): Payer: Medicare Other | Admitting: Lab

## 2013-02-13 VITALS — BP 116/80 | HR 110 | Temp 97.5°F

## 2013-02-13 DIAGNOSIS — C569 Malignant neoplasm of unspecified ovary: Secondary | ICD-10-CM

## 2013-02-13 DIAGNOSIS — Z01812 Encounter for preprocedural laboratory examination: Secondary | ICD-10-CM | POA: Insufficient documentation

## 2013-02-13 LAB — BASIC METABOLIC PANEL
BUN: 18 mg/dL (ref 6–23)
Calcium: 9.1 mg/dL (ref 8.4–10.5)
Creatinine, Ser: 0.91 mg/dL (ref 0.50–1.10)
GFR calc Af Amer: 66 mL/min — ABNORMAL LOW (ref >90)

## 2013-02-13 LAB — CBC WITH DIFFERENTIAL/PLATELET
Basophils Absolute: 0 10*3/uL (ref 0.0–0.1)
EOS%: 2.1 % (ref 0.0–7.0)
HGB: 11.4 g/dL — ABNORMAL LOW (ref 11.6–15.9)
LYMPH%: 20.6 % (ref 14.0–49.7)
MCH: 32.6 pg (ref 25.1–34.0)
MCV: 97.8 fL (ref 79.5–101.0)
MONO%: 8.8 % (ref 0.0–14.0)
Platelets: 178 10*3/uL (ref 145–400)
RDW: 15.8 % — ABNORMAL HIGH (ref 11.2–14.5)

## 2013-02-13 LAB — PROTIME-INR: Prothrombin Time: 21.6 seconds — ABNORMAL HIGH (ref 11.6–15.2)

## 2013-02-13 MED ORDER — SODIUM CHLORIDE 0.9 % IJ SOLN
10.0000 mL | INTRAMUSCULAR | Status: DC | PRN
  Administered 2013-02-13: 10 mL via INTRAVENOUS
  Filled 2013-02-13: qty 10

## 2013-02-13 MED ORDER — HEPARIN SOD (PORK) LOCK FLUSH 100 UNIT/ML IV SOLN
500.0000 [IU] | Freq: Once | INTRAVENOUS | Status: AC
  Administered 2013-02-13: 500 [IU] via INTRAVENOUS
  Filled 2013-02-13: qty 5

## 2013-02-13 NOTE — Patient Instructions (Signed)
YOUR SURGERY IS SCHEDULED AT [facility]  ON:  Wednesday  7/30  REPORT TO [facility] AT:  10:30 AM      PHONE # FOR SHORT STAY IS [phone]  DO NOT EAT ANYTHING AFTER MIDNIGHT THE NIGHT BEFORE YOUR SURGERY.  NO FOOD, NO CHEWING GUM, NO MINTS, NO CANDIES, NO CHEWING TOBACCO. YOU MAY HAVE CLEAR LIQUIDS TO DRINK FROM MIDNIGHT UNTIL 6:30 AM DAY OF SURGERY - LIKE WATER, TEA ( NO MILK OR MILK PRODUCTS ).  NOTHING TO DRINK AFTER 6:30 AM DAY OF YOUR SURGERY.  PLEASE TAKE THE FOLLOWING MEDICATIONS THE AM OF YOUR SURGERY WITH A FEW SIPS OF WATER:  CARDIZEM, TOPROL    DO NOT BRING VALUABLES, MONEY, CREDIT CARDS.  DO NOT WEAR JEWELRY, MAKE-UP, NAIL POLISH AND NO METAL PINS OR CLIPS IN YOUR HAIR. CONTACT LENS, DENTURES / PARTIALS, GLASSES SHOULD NOT BE WORN TO SURGERY AND IN MOST CASES-HEARING AIDS WILL NEED TO BE REMOVED.  BRING YOUR GLASSES CASE, ANY EQUIPMENT NEEDED FOR YOUR CONTACT LENS. FOR PATIENTS ADMITTED TO THE HOSPITAL--CHECK OUT TIME THE DAY OF DISCHARGE IS 11:00 AM.  ALL INPATIENT ROOMS ARE PRIVATE - WITH BATHROOM, TELEPHONE, TELEVISION AND WIFI INTERNET.  IF YOU ARE BEING DISCHARGED THE SAME DAY OF YOUR SURGERY--YOU CAN NOT DRIVE YOURSELF HOME--AND SHOULD NOT GO HOME ALONE BY TAXI OR BUS.  NO DRIVING OR OPERATING MACHINERY FOR 24 HOURS FOLLOWING ANESTHESIA / PAIN MEDICATIONS.  PLEASE MAKE ARRANGEMENTS FOR SOMEONE TO BE WITH YOU AT HOME THE FIRST 24 HOURS AFTER SURGERY. RESPONSIBLE DRIVER'S NAME  GARY Whaling - PT'S SON                                               PHONE #   [phone]                              FAILURE TO FOLLOW THESE INSTRUCTIONS MAY RESULT IN THE CANCELLATION OF YOUR SURGERY.   PATIENT SIGNATURE_________________________________

## 2013-02-13 NOTE — Pre-Procedure Instructions (Signed)
PT HAS EKG REPORT 01/10/13 AND CXR REPORT 09/28/12 - REPORTS IN EPIC. CARDIOLOGY OFFICE NOTES DR. Royann Shivers 01/10/13 IN EPIC. PACEMAKER ORDERS ON CHART FROM DR. CROITORU ON PT'S CHART " MAGNET SHOULD BE PLACED OVER DEVICE DURING PROCEDURE - POST OP INTERROGATION NOT NEEDED.

## 2013-02-14 ENCOUNTER — Telehealth: Payer: Self-pay | Admitting: *Deleted

## 2013-02-14 NOTE — Telephone Encounter (Signed)
Notified of normal CA 125 results. She reports her PAC is being removed in August.

## 2013-02-14 NOTE — Telephone Encounter (Signed)
Message copied by Wandalee Ferdinand on Wed Feb 14, 2013 12:10 PM ------      Message from: Ladene Artist      Created: Tue Feb 13, 2013  9:49 PM       Please call patient, ca125 is normal ------

## 2013-02-17 ENCOUNTER — Encounter: Payer: Self-pay | Admitting: Cardiovascular Disease

## 2013-02-21 ENCOUNTER — Ambulatory Visit (HOSPITAL_COMMUNITY)
Admission: RE | Admit: 2013-02-21 | Discharge: 2013-02-21 | Disposition: A | Discharge status: Home / Self Care | Payer: Medicare Other | Source: Ambulatory Visit | Attending: General Surgery | Admitting: General Surgery

## 2013-02-21 ENCOUNTER — Encounter (HOSPITAL_COMMUNITY): Payer: Self-pay | Admitting: Anesthesiology

## 2013-02-21 ENCOUNTER — Encounter (HOSPITAL_COMMUNITY)
Admission: RE | Disposition: A | Discharge status: Home / Self Care | Payer: Self-pay | Source: Ambulatory Visit | Attending: General Surgery

## 2013-02-21 ENCOUNTER — Ambulatory Visit (HOSPITAL_COMMUNITY): Payer: Medicare Other | Admitting: Anesthesiology

## 2013-02-21 ENCOUNTER — Encounter (HOSPITAL_COMMUNITY): Payer: Self-pay | Admitting: *Deleted

## 2013-02-21 DIAGNOSIS — Z452 Encounter for adjustment and management of vascular access device: Secondary | ICD-10-CM

## 2013-02-21 DIAGNOSIS — Z9221 Personal history of antineoplastic chemotherapy: Secondary | ICD-10-CM | POA: Insufficient documentation

## 2013-02-21 DIAGNOSIS — Z8543 Personal history of malignant neoplasm of ovary: Secondary | ICD-10-CM | POA: Insufficient documentation

## 2013-02-21 DIAGNOSIS — Z980 Intestinal bypass and anastomosis status: Secondary | ICD-10-CM | POA: Insufficient documentation

## 2013-02-21 HISTORY — PX: PORT-A-CATH REMOVAL: SHX5289

## 2013-02-21 LAB — PROTIME-INR
INR: 0.97 (ref 0.00–1.49)
Prothrombin Time: 12.7 seconds (ref 11.6–15.2)

## 2013-02-21 SURGERY — REMOVAL PORT-A-CATH
Anesthesia: Monitor Anesthesia Care | Site: Chest | Wound class: Clean

## 2013-02-21 MED ORDER — BUPIVACAINE-EPINEPHRINE PF 0.25-1:200000 % IJ SOLN
INTRAMUSCULAR | Status: AC
  Filled 2013-02-21: qty 30

## 2013-02-21 MED ORDER — LIDOCAINE HCL (PF) 1 % IJ SOLN
INTRAMUSCULAR | Status: DC | PRN
  Administered 2013-02-21: 3 mL

## 2013-02-21 MED ORDER — LIDOCAINE HCL 1 % IJ SOLN
INTRAMUSCULAR | Status: AC
  Filled 2013-02-21: qty 40

## 2013-02-21 MED ORDER — OXYCODONE-ACETAMINOPHEN 5-325 MG PO TABS: 1.0000 | ORAL_TABLET | ORAL | Status: DC | PRN

## 2013-02-21 MED ORDER — LACTATED RINGERS IV SOLN
INTRAVENOUS | Status: DC
  Administered 2013-02-21: 1000 mL via INTRAVENOUS

## 2013-02-21 MED ORDER — PROPOFOL INFUSION 10 MG/ML OPTIME
INTRAVENOUS | Status: DC | PRN
  Administered 2013-02-21: 50 ug/kg/min via INTRAVENOUS

## 2013-02-21 MED ORDER — BUPIVACAINE-EPINEPHRINE 0.25% -1:200000 IJ SOLN
INTRAMUSCULAR | Status: DC | PRN
  Administered 2013-02-21: 3 mL

## 2013-02-21 MED ORDER — FENTANYL CITRATE 0.05 MG/ML IJ SOLN
INTRAMUSCULAR | Status: DC | PRN
  Administered 2013-02-21 (×2): 50 ug via INTRAVENOUS

## 2013-02-21 MED ORDER — MIDAZOLAM HCL 5 MG/5ML IJ SOLN
INTRAMUSCULAR | Status: DC | PRN
  Administered 2013-02-21: 2 mg via INTRAVENOUS

## 2013-02-21 SURGICAL SUPPLY — 36 items
ADH SKN CLS APL DERMABOND .7 (GAUZE/BANDAGES/DRESSINGS) ×1
BLADE HEX COATED 2.75 (ELECTRODE) ×2 IMPLANT
BLADE SURG 15 STRL LF DISP TIS (BLADE) ×1 IMPLANT
BLADE SURG 15 STRL SS (BLADE) ×2
CANISTER SUCTION 2500CC (MISCELLANEOUS) ×2 IMPLANT
CLOTH BEACON ORANGE TIMEOUT ST (SAFETY) ×2 IMPLANT
DECANTER SPIKE VIAL GLASS SM (MISCELLANEOUS) ×2 IMPLANT
DERMABOND ADVANCED (GAUZE/BANDAGES/DRESSINGS) ×1
DERMABOND ADVANCED .7 DNX12 (GAUZE/BANDAGES/DRESSINGS) ×1 IMPLANT
DRAPE LAPAROTOMY TRNSV 102X78 (DRAPE) ×2 IMPLANT
ELECT REM PT RETURN 9FT ADLT (ELECTROSURGICAL) ×2
ELECTRODE REM PT RTRN 9FT ADLT (ELECTROSURGICAL) ×1 IMPLANT
GLOVE BIO SURGEON STRL SZ7 (GLOVE) ×4 IMPLANT
GLOVE BIOGEL PI IND STRL 7.0 (GLOVE) ×1 IMPLANT
GLOVE BIOGEL PI IND STRL 7.5 (GLOVE) ×2 IMPLANT
GLOVE BIOGEL PI INDICATOR 7.0 (GLOVE) ×1
GLOVE BIOGEL PI INDICATOR 7.5 (GLOVE) ×2
GOWN PREVENTION PLUS LG XLONG (DISPOSABLE) ×2 IMPLANT
GOWN PREVENTION PLUS XLARGE (GOWN DISPOSABLE) ×2 IMPLANT
GOWN STRL NON-REIN LRG LVL3 (GOWN DISPOSABLE) ×2 IMPLANT
GOWN STRL REIN XL XLG (GOWN DISPOSABLE) ×2 IMPLANT
KIT BASIN OR (CUSTOM PROCEDURE TRAY) ×2 IMPLANT
NDL HYPO 25X1 1.5 SAFETY (NEEDLE) ×1 IMPLANT
NEEDLE HYPO 22GX1.5 SAFETY (NEEDLE) IMPLANT
NEEDLE HYPO 25X1 1.5 SAFETY (NEEDLE) ×2 IMPLANT
PACK BASIC VI WITH GOWN DISP (CUSTOM PROCEDURE TRAY) ×2 IMPLANT
PENCIL BUTTON HOLSTER BLD 10FT (ELECTRODE) ×2 IMPLANT
SPONGE GAUZE 4X4 12PLY (GAUZE/BANDAGES/DRESSINGS) IMPLANT
SPONGE LAP 4X18 X RAY DECT (DISPOSABLE) ×2 IMPLANT
SUT MNCRL AB 4-0 PS2 18 (SUTURE) ×2 IMPLANT
SUT VIC AB 3-0 SH 27 (SUTURE)
SUT VIC AB 3-0 SH 27XBRD (SUTURE) IMPLANT
SYR BULB IRRIGATION 50ML (SYRINGE) IMPLANT
SYR CONTROL 10ML LL (SYRINGE) ×2 IMPLANT
TOWEL OR 17X26 10 PK STRL BLUE (TOWEL DISPOSABLE) ×2 IMPLANT
YANKAUER SUCT BULB TIP 10FT TU (MISCELLANEOUS) IMPLANT

## 2013-02-21 NOTE — Interval H&P Note (Signed)
History and Physical Interval Note:  02/21/2013 11:57 AM  Victoria [name]  has presented today for surgery, with the diagnosis of no longer needs port will remove  The various methods of treatment have been discussed with the patient and family. After consideration of risks, benefits and other options for treatment, the patient has consented to  Procedure(s): REMOVAL PORT-A-CATH (N/A) as a surgical intervention .  The patient's history has been reviewed, patient examined, no change in status, stable for surgery.  I have reviewed the patient's chart and labs.  Questions were answered to the patient's satisfaction.     [name]

## 2013-02-21 NOTE — H&P (View-Only) (Signed)
Subjective:     Patient ID: Victoria [name], female   DOB: 08/19/1926, 79 y.o.   MRN: [id number]  HPI This is an 79-year-old female who I know well from ovarian cancer. She was obstructed I initially did a loop colostomy as well as an intestinal bypass. She was then treated with chemotherapy and returned to the operating room for her gynecologic procedure as well as a revision of her colostomy. She has now completed all of her therapy. She returns today doing well without any real significant complaints. She has some increased flatulence that we discussed taking some simethicone for. She is otherwise eating well and has been gaining some weight. She comes in today to ask about her Port-A-Cath being removed. Her skin is very thin over this and she is concerned about that.  Review of Systems     Objective:   Physical Exam Right chest wall with port in place, very thin skin overlying it Abdomen soft nontender colostomy pink and functional    Assessment:     Ovarian cancer Port      Plan:     Port removal  We discussed again the remaining with her colostomy. I discussed with her taking it down to what that would involve and she elected not to do that. We did discuss removing her port as her skin is very thin overlying this. I am concerned that this will come through her skin if we do not remove it. I discussed port removal as an outpatient. All the recommendations on her Coumadin which will need to be stopped prior to this as well.       

## 2013-02-21 NOTE — Op Note (Signed)
Preoperative diagnosis: Right subclavian port no longer needs for venous access Postoperative diagnosis: Same as above Procedure: Right subclavian port removal Surgeon: Dr. Harden Mo Anesthesia: Local MAC Estimated blood loss: Minimal Complications: None Specimens: None Drains: None Sponge count correct at completion Disposition to recovery stable  Indications: This is an 79 year old female who I know well from her treatment for ovarian cancer. She's done with her chemotherapy. Her right subclavian Port-A-Cath placed as no longer needed. In addition it looks like it is about to erode through skin and she and I discussed removing this for now.  Procedure: After informed consent was obtained the patient was taken to the operating room. She was placed under monitored anesthesia care. Her right chest was prepped and draped in the standard sterile surgical fashion. A surgical timeout was performed.  I infiltrated a mixture of Marcaine and lidocaine overlying the port. I then reentered her old incision. The port, suture material, and line were removed completely. Hemostasis was observed. I closed this with 3-0 Vicryl, 4-0 Monocryl, and Dermabond. She tolerated this well and was transferred to recovery stable.

## 2013-02-21 NOTE — Transfer of Care (Signed)
Immediate Anesthesia Transfer of Care Note  Patient: Victoria [name]  Procedure(s) Performed: Procedure(s): REMOVAL PORT-A-CATH (N/A)  Patient Location: PACU  Anesthesia Type:MAC  Level of Consciousness: awake, alert  and oriented  Airway & Oxygen Therapy: Patient Spontanous Breathing and Patient connected to face mask oxygen  Post-op Assessment: Report given to PACU RN and Post -op Vital signs reviewed and stable  Post vital signs: Reviewed and stable  Complications: No apparent anesthesia complications

## 2013-02-21 NOTE — Anesthesia Postprocedure Evaluation (Signed)
  Anesthesia Post-op Note  Patient: Victoria [name]  Procedure(s) Performed: Procedure(s) (LRB): REMOVAL PORT-A-CATH (N/A)  Patient Location: PACU  Anesthesia Type: MAC  Level of Consciousness: awake and alert   Airway and Oxygen Therapy: Patient Spontanous Breathing  Post-op Pain: mild  Post-op Assessment: Post-op Vital signs reviewed, Patient's Cardiovascular Status Stable, Respiratory Function Stable, Patent Airway and No signs of Nausea or vomiting  Last Vitals:  Filed Vitals:   02/21/13 1330  BP: 130/74  Pulse: 79  Temp: 36.7 C  Resp:     Post-op Vital Signs: stable   Complications: No apparent anesthesia complications

## 2013-02-21 NOTE — Anesthesia Preprocedure Evaluation (Signed)
Anesthesia Evaluation  Patient identified by MRN, date of birth, ID band Patient awake    Reviewed: Allergy & Precautions, H&P , NPO status , Patient's Chart, lab work & pertinent test results  History of Anesthesia Complications (+) PONV  Airway Mallampati: II TM Distance: >3 FB Neck ROM: Full    Dental no notable dental hx.    Pulmonary neg pulmonary ROS,  breath sounds clear to auscultation  Pulmonary exam normal       Cardiovascular Exercise Tolerance: Good hypertension, Pt. on medications + CAD + dysrhythmias + pacemaker Rhythm:Regular Rate:Normal  Pacemaker.   Neuro/Psych Anxiety negative neurological ROS     GI/Hepatic negative GI ROS, Neg liver ROS,   Endo/Other  negative endocrine ROS  Renal/GU negative Renal ROS  negative genitourinary   Musculoskeletal negative musculoskeletal ROS (+)   Abdominal   Peds negative pediatric ROS (+)  Hematology negative hematology ROS (+)   Anesthesia Other Findings   Reproductive/Obstetrics negative OB ROS                           Anesthesia Physical Anesthesia Plan  ASA: III  Anesthesia Plan: MAC   Post-op Pain Management:    Induction: Intravenous  Airway Management Planned:   Additional Equipment:   Intra-op Plan:   Post-operative Plan:   Informed Consent: I have reviewed the patients History and Physical, chart, labs and discussed the procedure including the risks, benefits and alternatives for the proposed anesthesia with the patient or authorized representative who has indicated his/her understanding and acceptance.   Dental advisory given  Plan Discussed with: CRNA  Anesthesia Plan Comments:         Anesthesia Quick Evaluation

## 2013-02-22 ENCOUNTER — Encounter (HOSPITAL_COMMUNITY): Payer: Self-pay | Admitting: General Surgery

## 2013-02-27 ENCOUNTER — Other Ambulatory Visit: Payer: Self-pay | Admitting: Cardiovascular Disease

## 2013-02-28 NOTE — Telephone Encounter (Signed)
Rx was sent to pharmacy electronically. 

## 2013-03-08 ENCOUNTER — Ambulatory Visit (INDEPENDENT_AMBULATORY_CARE_PROVIDER_SITE_OTHER): Payer: Medicare Other | Admitting: Pharmacist Clinician (PhC)/ Clinical Pharmacy Specialist

## 2013-03-08 VITALS — BP 138/90 | HR 112

## 2013-03-08 DIAGNOSIS — I4891 Unspecified atrial fibrillation: Secondary | ICD-10-CM

## 2013-03-08 DIAGNOSIS — I480 Paroxysmal atrial fibrillation: Secondary | ICD-10-CM

## 2013-03-08 DIAGNOSIS — Z7901 Long term (current) use of anticoagulants: Secondary | ICD-10-CM

## 2013-03-12 ENCOUNTER — Ambulatory Visit (INDEPENDENT_AMBULATORY_CARE_PROVIDER_SITE_OTHER): Payer: Medicare Other | Admitting: General Surgery

## 2013-03-12 ENCOUNTER — Encounter (INDEPENDENT_AMBULATORY_CARE_PROVIDER_SITE_OTHER): Payer: Self-pay | Admitting: General Surgery

## 2013-03-12 VITALS — BP 120/78 | HR 80 | Temp 98.1°F | Resp 15 | Ht 61.0 in | Wt 132.4 lb

## 2013-03-12 DIAGNOSIS — Z09 Encounter for follow-up examination after completed treatment for conditions other than malignant neoplasm: Secondary | ICD-10-CM

## 2013-03-13 NOTE — Progress Notes (Signed)
Subjective:     Patient ID: Victoria [name], female   DOB: 25-Nov-1926, 79 y.o.   MRN: [id number]  HPI 79 yof s/p port removal who returns doing well without any complaints. She still has bulge at her ostomy site in rlq but this is asymptomatic still The stoma is functional.  Review of Systems     Objective:   Physical Exam Port removal site on chest clean without infection Parastomal hernia that is nontender and reducible    Assessment:     S/p port removal Parastomal hernia     Plan:     She can resume normal activity from port removal. I discussed with her surgery to repair hernia and indications for that but she does not want to pursue at this time.  She will let me know if she changes her mind or becomes symptomatic.

## 2013-03-27 ENCOUNTER — Other Ambulatory Visit (HOSPITAL_BASED_OUTPATIENT_CLINIC_OR_DEPARTMENT_OTHER): Payer: Medicare Other | Admitting: Lab

## 2013-03-27 ENCOUNTER — Ambulatory Visit (HOSPITAL_BASED_OUTPATIENT_CLINIC_OR_DEPARTMENT_OTHER): Payer: Medicare Other | Admitting: Oncology

## 2013-03-27 ENCOUNTER — Other Ambulatory Visit: Payer: Medicare Other | Admitting: Lab

## 2013-03-27 ENCOUNTER — Telehealth: Payer: Self-pay | Admitting: Oncology

## 2013-03-27 VITALS — BP 124/80 | HR 97 | Temp 97.8°F | Resp 20 | Ht 61.0 in | Wt 132.4 lb

## 2013-03-27 DIAGNOSIS — C785 Secondary malignant neoplasm of large intestine and rectum: Secondary | ICD-10-CM

## 2013-03-27 DIAGNOSIS — C569 Malignant neoplasm of unspecified ovary: Secondary | ICD-10-CM

## 2013-03-27 DIAGNOSIS — I472 Ventricular tachycardia: Secondary | ICD-10-CM

## 2013-03-27 DIAGNOSIS — Z7901 Long term (current) use of anticoagulants: Secondary | ICD-10-CM

## 2013-03-27 NOTE — Telephone Encounter (Signed)
Gave pt appt for lab and Md on December 2014 °

## 2013-03-27 NOTE — Progress Notes (Signed)
   [facility]    OFFICE PROGRESS NOTE   INTERVAL HISTORY:   She returns as scheduled. She feels well. Good appetite and energy level. No complaint. The colostomy is functioning well.  Objective:  Vital signs in last 24 hours:  Blood pressure 124/80, pulse 97, temperature 97.8 F (36.6 C), temperature source Oral, resp. rate 20, height 5\' 1"  (1.549 m), weight 132 lb 6.4 oz (60.056 kg).    HEENT: Neck without mass Lymphatics: No cervical, supra-clavicular, axillary, or inguinal nodes Resp: Decreased breath sounds over the right chest, no respiratory distress Cardio: Irregular GI: Left lower quadrant colostomy, no hepatomegaly, no mass, no apparent ascites Vascular: No leg edema   Lab Results:  CA 125 pending  Medications: I have reviewed the patient's current medications.  Assessment/Plan: 1. Ovarian cancer-presenting with a rectal mass , status post sigmoidoscopy 03/13/2012 with findings of a 5 cm malignant appearing mass with central ulceration in the rectum 2-3 cm proximal to the anal verge. Pathology showed moderate to poorly differentiated adenocarcinoma with ulceration and prolapse changes. By immunohistochemistry the malignant cells were positive for cytokeratin 7, estrogen receptor and WT-1. They were negative for cytokeratin 20 and CDX-2. This immunohistochemical profile was strongly suggestive of a gynecologic primary. -Status post 3 cycles of Taxol/carboplatin chemotherapy with normalization of the CA 125, restaging CT 08/07/2012 with marked improvement in the pelvic masses and no evidence of progressive ovarian cancer. She completed cycle 4 of Taxol/carboplatin beginning 08/08/2012. 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 She completed cycle 6 Taxol/carboplatin beginning 11/21/2012. 2. CT abdomen/pelvis on  03/02/2012 with a 6.7 cm mass along the right aspect of the rectum; 2 adjacent nodal masses in the right mid abdomen measuring 8.9 x 7.4 x 7.2 cm and an additional 3.0 x 3.0 x 2.6 cm nodal mass posterior to the cecum. 3. PET scan 03/22/2012 with an intensely hypermetabolic large mass along the right aspect of the rectum; an intensely hypermetabolic right iliac fossa mass; increased caliber of small bowel loops containing air-fluid levels. 4. Hospitalization with nausea/vomiting, abdominal distention due to a small bowel obstruction status post bypass procedure and descending loop colostomy, and gastrostomy tube placement 04/04/2012. The gastrostomy tube has been removed. 5. Atrial fibrillation maintained on Coumadin.  6. Status post Port-A-Cath placement 05/04/2012. Port-A-Cath removal 02/21/2013 7. History of neutropenia secondary to chemotherapy causing a treatment delay following cycle 2. The Taxol was dose reduced beginning with cycle 3. 8. 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 Disposition:  She remains in clinical remission from ovarian cancer. We will followup on the CA 125 from today. Ms. Rathbun will return for an office visit and CA 125 in 3 months.    Thornton Papas, MD  03/27/2013  11:42 AM

## 2013-03-29 ENCOUNTER — Telehealth: Payer: Self-pay | Admitting: *Deleted

## 2013-03-29 ENCOUNTER — Ambulatory Visit
Admission: RE | Admit: 2013-03-29 | Discharge: 2013-03-29 | Disposition: A | Discharge status: Home / Self Care | Payer: Medicare Other | Source: Ambulatory Visit | Attending: Internal Medicine | Admitting: Internal Medicine

## 2013-03-29 DIAGNOSIS — R928 Other abnormal and inconclusive findings on diagnostic imaging of breast: Secondary | ICD-10-CM

## 2013-03-29 NOTE — Telephone Encounter (Signed)
Message copied by Caleb Popp on Thu Mar 29, 2013  1:42 PM ------      Message from: Thornton Papas B      Created: Wed Mar 28, 2013  9:56 PM       Please call patient, ca125 is normal ------

## 2013-03-29 NOTE — Telephone Encounter (Signed)
Called pt with tumor marker results, she voiced understanding.

## 2013-04-13 ENCOUNTER — Ambulatory Visit (INDEPENDENT_AMBULATORY_CARE_PROVIDER_SITE_OTHER): Payer: Medicare Other | Admitting: Cardiovascular Disease

## 2013-04-13 ENCOUNTER — Encounter: Payer: Self-pay | Admitting: Cardiovascular Disease

## 2013-04-13 ENCOUNTER — Ambulatory Visit: Payer: Medicare Other | Admitting: Pharmacist Clinician (PhC)/ Clinical Pharmacy Specialist

## 2013-04-13 ENCOUNTER — Other Ambulatory Visit: Payer: Self-pay | Admitting: Cardiovascular Disease

## 2013-04-13 VITALS — BP 128/80 | HR 88 | Resp 16 | Ht 61.0 in | Wt 134.1 lb

## 2013-04-13 DIAGNOSIS — I471 Supraventricular tachycardia: Secondary | ICD-10-CM

## 2013-04-13 DIAGNOSIS — Z950 Presence of cardiac pacemaker: Secondary | ICD-10-CM

## 2013-04-13 DIAGNOSIS — I480 Paroxysmal atrial fibrillation: Secondary | ICD-10-CM

## 2013-04-13 DIAGNOSIS — I4891 Unspecified atrial fibrillation: Secondary | ICD-10-CM

## 2013-04-13 LAB — PACEMAKER DEVICE OBSERVATION
AL IMPEDENCE PM: 387 Ohm
ATRIAL PACING PM: 83
BATTERY VOLTAGE: 2.78 V
BRDY-0003RV: 95 {beats}/min
BRDY-0004RV: 120 {beats}/min
RV LEAD THRESHOLD: 1.25 V

## 2013-04-13 LAB — POCT INR: INR: 4.6

## 2013-04-13 MED ORDER — METOPROLOL SUCCINATE ER 50 MG PO TB24: 50.0000 mg | ORAL_TABLET | Freq: Every morning | ORAL | Status: DC

## 2013-04-13 NOTE — Patient Instructions (Addendum)
Your physician has recommended you make the following change in your medication:  - Do not take any warfarin today or tomorrow; restart warfarin 5 mg daily on Sunday, September 21. Continue taking warfarin 5 mg daily until your next INR check in 2 weeks. - Increase metoprolol succinate 50 mg once daily  Your physician recommends that you schedule a follow-up appointment in: 3 months. Visit with MD and pacemaker check same day.

## 2013-04-13 NOTE — Progress Notes (Signed)
In office pacemaker interrogation. Normal device function. No changes made this session. 

## 2013-04-14 ENCOUNTER — Encounter: Payer: Self-pay | Admitting: Cardiovascular Disease

## 2013-04-14 NOTE — Assessment & Plan Note (Addendum)
She has had problems with atrial tachycardia, atrial flutter and atrial fibrillation. Generally rate control has been satisfactory except for periods when she has atrial tachycardia with one-to-one conduction. Currently while in atrial flutter she has excellent ventricular rate control most of the time, occasionally with heart rates in the 110-120s. We attempted to perform overdrive pacing in the office via the atrial lead of her pacemaker. This was unsuccessful. The atrial flutter cycle length is around 220 ms. Overdrive pacing at cycle lengths of 200 ms, 180 ms and 160 ms were all unsuccessful spite multiple attempts. We never appear to truly engaged the flutter circuit doing pacing even at low cycle lengths. She's not particularly symptomatic. I offered antiarrhythmic therapy with other agents. She did not tolerate Multaq. We discussed tic ascending in a knee odor on. She asked several questions. She very firmly declined to take any other antiarrhythmic therapy. She is clearly more concerned about the side effects of medications and she is about the arrhythmia. She is probably right. She did take amiodarone without problems briefly in the perioperative period of her pelvic surgery and did not have side effects. Nevertheless she has a friend who she thinks lost her vision because of amiodarone and is now taking chances. I have recommended that we increase the dose of metoprolol to provide better ventricular rate control.  She will hold her warfarin for the next couple of days because her INR is supratherapeutic at 4.6. She will then resume half the current dose and have a repeat PT/INR on Tuesday. She should continue lifelong warfarin therapy, barring bleeding complications.

## 2013-04-14 NOTE — Progress Notes (Signed)
Patient ID: Victoria [name], female   DOB: October 30, 1926, 79 y.o.   MRN: [id number]      Reason for office visit Atrial fibrillation/flutter, pacemaker  Victoria [name] has had a variety of atrial arrhythmias for many years. Earlier this year this was a big problem which was hospitalized with a bowel obstruction related to a large pelvic mass with involvement of the rectum. At that time showed mostly atrial tachycardia with one-to-one conduction and difficult rate control. When we last saw her about 2 months ago she was in atrial paced rhythm but interrogation of her pacemaker today shows that she has been in persistent atrial flutter for about 3 months. Occasionally her ventricular has been high and this was noted at her followup with her oncologist recently. Today rate control is very good. She is not really aware of the arrhythmia or tachycardia. She denies any chest pain or shortness of breath.  We have previously tried treatment with Intron Multaq, which caused intolerable GI side effects. She did take amiodarone briefly around the time of her pelvic surgery to prevent recurrence of her arrhythmia. She tolerated that without side effects.  INR today is 4.6.    Allergies  Allergen Reactions  . Pindolol Swelling    Throat swelled up  . Iodine Other (See Comments)    BLISTERS  . Shellfish Allergy   . Vibramycin [Doxycycline Calcium] Nausea Only  . Doxycycline Nausea Only  . Levofloxacin Other (See Comments)    DIZZINESS  . Multaq [Dronedarone]     CAUSED SEVERE BURNING GI TRACT    Current Outpatient Prescriptions  Medication Sig Dispense Refill  . Cholecalciferol 2000 UNITS TABS Take 2,000 Units by mouth daily.      Marland Kitchen diltiazem (CARDIZEM CD) 240 MG 24 hr capsule take 1 capsule by mouth once daily  90 capsule  3  . feeding supplement (ENSURE IMMUNE HEALTH) LIQD Take 237 mLs by mouth daily.       Marland Kitchen lactose free nutrition (BOOST) LIQD Take 237 mLs by mouth daily.      . metoprolol  succinate (TOPROL-XL) 50 MG 24 hr tablet Take 1 tablet (50 mg total) by mouth every morning.  90 tablet  3  . ondansetron (ZOFRAN) 4 MG tablet Take 4 mg by mouth every 8 (eight) hours as needed for nausea.       . simvastatin (ZOCOR) 20 MG tablet Take 20 mg by mouth every evening.      . warfarin (COUMADIN) 5 MG tablet Take 5 mg by mouth at bedtime.        No current facility-administered medications for this visit.   Facility-Administered Medications Ordered in Other Visits  Medication Dose Route Frequency Provider Last Rate Last Dose  . heparin 6,000 Units in sodium chloride irrigation 0.9 % 500 mL irrigation   Irrigation Once Emelia Loron, MD      . sodium chloride 0.9 % injection 10 mL  10 mL Intracatheter PRN Ladene Artist, MD   10 mL at 07/18/12 1248    Past Medical History  Diagnosis Date  . Hyperlipidemia   . Rectal carcinoma   . Colon cancer 03/13/12 egd    rectal mass5cm  , 2-3cm proximal to anal verge  . Diverticulosis   . Allergy   . Anxiety     rhematoid  . Colostomy in place   . PONV (postoperative nausea and vomiting)   . Chronic anticoagulation   . Fluid collection (edema) in the arms, legs, hands and  feet   . Urinary frequency     with burning x 2 days  . History of blood transfusion   . Hypertension   . Pacemaker   . Coronary artery disease     DR. Audrina Marten IS PT'S CARDIOLOGIST  . Atrial fibrillation   . Sinus node dysfunction 09/10/2008    Pacemaker St.Jude  . RBBB   . PAT (paroxysmal atrial tachycardia)     Past Surgical History  Procedure Laterality Date  . Permanent pacemaker insertion  09/10/2008    [employer].Jude  . Rib removed  1934    Pleurisy and pneumonia  . Partial hysterectomy  1972  . Bladder neck suspension  1986  . Heart catherization  [id number]  . Abdominal hysterectomy      both ovaries intact,   . Colon resection  04/04/2012    Procedure: COLON RESECTION;  Surgeon: Emelia Loron, MD;  Location: [facility];  Service: General;   Laterality: N/A;  laparotomy with small bowel resection for obstruction with colostomy with gastrostomy tube placement.   . Portacath placement  05/04/2012    Procedure: INSERTION PORT-A-CATH;  Surgeon: Emelia Loron, MD;  Location: [facility];  Service: General;  Laterality: Right;  . Insert / replace / remove pacemaker  2/10  . Laparotomy Bilateral 10/03/2012    Procedure: EXPLORATORY LAPAROTOMY;  Surgeon: Rejeana Brock A. Duard Brady, MD;  Location: [facility];  Service: Gynecology;  Laterality: Bilateral;  WITH BSO, TUMOR DEBULKING  . Colostomy revision N/A 10/03/2012    Procedure: COLOSTOMY REVISION;  Surgeon: Emelia Loron, MD;  Location: [facility];  Service: General;  Laterality: N/A;  COLOSTOMY REVISION, Sigmoid colectomy  . US echocardiography  07/17/2008    EF =>55%,mild mitral annular ca+,mild-mod MR,TR,AOV mildly sclerotic  . Nm myoview ltd  07/17/2008    no ischemia  . Port-a-cath removal N/A 02/21/2013    Procedure: REMOVAL PORT-A-CATH;  Surgeon: Emelia Loron, MD;  Location: [facility];  Service: General;  Laterality: N/A;    Family History  Problem Relation Age of Onset  . Heart disease Mother     History   Social History  . Marital Status: Married    Spouse Name: N/A    Number of Children: 2  . Years of Education: N/A   Occupational History  . Retired     Armed forces operational officer,     Social History Main Topics  . Smoking status: Never Smoker   . Smokeless tobacco: Never Used  . Alcohol Use: No  . Drug Use: No  . Sexual Activity: Not on file   Other Topics Concern  . Not on file   Social History Narrative  . No narrative on file    Review of systems: The patient specifically denies any chest pain at rest or with exertion, dyspnea at rest or with exertion, orthopnea, paroxysmal nocturnal dyspnea, syncope, palpitations, focal neurological deficits, intermittent claudication, lower extremity edema, unexplained weight gain, cough, hemoptysis or wheezing.  The patient also denies  abdominal pain, nausea, vomiting, dysphagia, diarrhea, constipation, polyuria, polydipsia, dysuria, hematuria, frequency, urgency, abnormal bleeding or bruising, fever, chills, unexpected weight changes, mood swings, change in skin or hair texture, change in voice quality, auditory or visual problems, allergic reactions or rashes, new musculoskeletal complaints other than usual "aches and pains".   PHYSICAL EXAM BP 128/80  Pulse 88  Resp 16  Ht 5\' 1"  (1.549 m)  Wt 134 lb 1.6 oz (60.827 kg)  BMI 25.35 kg/m2  General: Alert, oriented x3, no distress Head: no evidence of trauma,  PERRL, EOMI, no exophtalmos or lid lag, no myxedema, no xanthelasma; normal ears, nose and oropharynx Neck: normal jugular venous pulsations and no hepatojugular reflux; brisk carotid pulses without delay and no carotid bruits Chest: clear to auscultation, no signs of consolidation by percussion or palpation, normal fremitus, symmetrical and full respiratory excursions Cardiovascular: normal position and quality of the apical impulse, irregular rhythm, normal first and second heart sounds, no murmurs, rubs or gallops Abdomen: no tenderness or distention, no masses by palpation, no abnormal pulsatility or arterial bruits, normal bowel sounds, no hepatosplenomegaly;  colostomy Extremities: no clubbing, cyanosis or edema; 2+ radial, ulnar and brachial pulses bilaterally; 2+ right femoral, posterior tibial and dorsalis pedis pulses; 2+ left femoral, posterior tibial and dorsalis pedis pulses; no subclavian or femoral bruits Neurological: grossly nonfocal  Lipid Panel     Component Value Date/Time   CHOL 115 04/10/2012 0520   TRIG 80 04/10/2012 0520   HDL 43 05/15/2008 0620   CHOLHDL 3.3 05/15/2008 0620   VLDL 17 05/15/2008 0620   LDLCALC  Value: 82        Total Cholesterol/HDL:CHD Risk Coronary Heart Disease Risk Table                     Men   Women  1/2 Average Risk   3.4   3.3 05/15/2008 0620    BMET    Component  Value Date/Time   NA 135 02/13/2013 0915   NA 135* 11/21/2012 0920   K 4.2 02/13/2013 0915   K 4.3 11/21/2012 0920   CL 101 02/13/2013 0915   CL 103 11/21/2012 0920   CO2 27 02/13/2013 0915   CO2 25 11/21/2012 0920   GLUCOSE 100* 02/13/2013 0915   GLUCOSE 101* 11/21/2012 0920   BUN 18 02/13/2013 0915   BUN 13.3 11/21/2012 0920   CREATININE 0.91 02/13/2013 0915   CREATININE 0.8 11/21/2012 0920   CALCIUM 9.1 02/13/2013 0915   CALCIUM 8.7 11/21/2012 0920   GFRNONAA 57* 02/13/2013 0915   GFRAA 66* 02/13/2013 0915     ASSESSMENT AND PLAN PAF fib/flutter- Hx RFA 2010 She has had problems with atrial tachycardia, atrial flutter and atrial fibrillation. Generally rate control has been satisfactory except for periods when she has atrial tachycardia with one-to-one conduction. Currently while in atrial flutter she has excellent ventricular rate control most of the time, occasionally with heart rates in the 110-120s. We attempted to perform overdrive pacing in the office via the atrial lead of her pacemaker. This was unsuccessful. The atrial flutter cycle length is around 220 ms. Overdrive pacing at cycle lengths of 200 ms, 180 ms and 160 ms were all unsuccessful spite multiple attempts. We never appear to truly engaged the flutter circuit doing pacing even at low cycle lengths. She's not particularly symptomatic. I offered antiarrhythmic therapy with other agents. She did not tolerate Multaq. We discussed tic ascending in a knee odor on. She asked several questions. She very firmly declined to take any other antiarrhythmic therapy. She is clearly more concerned about the side effects of medications and she is about the arrhythmia. She is probably right. She did take amiodarone without problems briefly in the perioperative period of her pelvic surgery and did not have side effects. Nevertheless she has a friend who she thinks lost her vision because of amiodarone and is now taking chances. I have recommended that we  increase the dose of metoprolol to provide better ventricular rate control.  She will hold her  warfarin for the next couple of days because her INR is supratherapeutic at 4.6. She will then resume half the current dose and have a repeat PT/INR on Tuesday. She should continue lifelong warfarin therapy, barring bleeding complications.  S/P St Jude Pace Feb 2006 after failed RFA [name] dual-chamber pacemaker implanted in 2010 with normal function by a full pacemaker check performed in the office today. She has been in persistent atrial flutter with ventricular rates in the 80s to 110s for about 3 months now. On the average ventricular rate control is very good although occasionally her heart rate exceeds 110. The flutter cycle length was around 220 ms. Overdrive atrial pacing with backup VVI was attempted several times without success. Recheck pacemaker function in 3 months. Unfortunately her devices not amenable to home monitoring.   Meds ordered this encounter  Medications  . metoprolol succinate (TOPROL-XL) 50 MG 24 hr tablet    Sig: Take 1 tablet (50 mg total) by mouth every morning.    Dispense:  90 tablet    Refill:  3    Lilliah Priego  Thurmon Fair, MD, [facility] [phone] office [phone] pager

## 2013-04-14 NOTE — Assessment & Plan Note (Signed)
[name] dual-chamber pacemaker implanted in 2010 with normal function by a full pacemaker check performed in the office today. She has been in persistent atrial flutter with ventricular rates in the 80s to 110s for about 3 months now. On the average ventricular rate control is very good although occasionally her heart rate exceeds 110. The flutter cycle length was around 220 ms. Overdrive atrial pacing with backup VVI was attempted several times without success. Recheck pacemaker function in 3 months. Unfortunately her devices not amenable to home monitoring.

## 2013-04-16 ENCOUNTER — Ambulatory Visit (INDEPENDENT_AMBULATORY_CARE_PROVIDER_SITE_OTHER): Payer: Medicare Other | Admitting: Pharmacist Clinician (PhC)/ Clinical Pharmacy Specialist

## 2013-04-16 ENCOUNTER — Telehealth: Payer: Self-pay | Admitting: Pharmacist Clinician (PhC)/ Clinical Pharmacy Specialist

## 2013-04-16 ENCOUNTER — Ambulatory Visit: Payer: Medicare Other | Admitting: Pharmacist Clinician (PhC)/ Clinical Pharmacy Specialist

## 2013-04-16 DIAGNOSIS — I480 Paroxysmal atrial fibrillation: Secondary | ICD-10-CM

## 2013-04-16 DIAGNOSIS — Z7901 Long term (current) use of anticoagulants: Secondary | ICD-10-CM

## 2013-04-16 DIAGNOSIS — I4891 Unspecified atrial fibrillation: Secondary | ICD-10-CM

## 2013-04-16 NOTE — Telephone Encounter (Signed)
Patient is returning your call about her protime results.

## 2013-04-24 ENCOUNTER — Ambulatory Visit (INDEPENDENT_AMBULATORY_CARE_PROVIDER_SITE_OTHER): Payer: Medicare Other | Admitting: Pharmacist Clinician (PhC)/ Clinical Pharmacy Specialist

## 2013-04-24 VITALS — BP 120/68 | HR 84

## 2013-04-24 DIAGNOSIS — I4891 Unspecified atrial fibrillation: Secondary | ICD-10-CM

## 2013-04-24 DIAGNOSIS — I480 Paroxysmal atrial fibrillation: Secondary | ICD-10-CM

## 2013-04-24 DIAGNOSIS — Z7901 Long term (current) use of anticoagulants: Secondary | ICD-10-CM

## 2013-05-15 ENCOUNTER — Ambulatory Visit (INDEPENDENT_AMBULATORY_CARE_PROVIDER_SITE_OTHER): Payer: Medicare Other | Admitting: Pharmacist Clinician (PhC)/ Clinical Pharmacy Specialist

## 2013-05-15 VITALS — BP 110/70

## 2013-05-15 DIAGNOSIS — I4891 Unspecified atrial fibrillation: Secondary | ICD-10-CM

## 2013-05-15 DIAGNOSIS — Z7901 Long term (current) use of anticoagulants: Secondary | ICD-10-CM

## 2013-05-15 DIAGNOSIS — I480 Paroxysmal atrial fibrillation: Secondary | ICD-10-CM

## 2013-05-29 ENCOUNTER — Ambulatory Visit (INDEPENDENT_AMBULATORY_CARE_PROVIDER_SITE_OTHER): Payer: Medicare Other | Admitting: Pharmacist Clinician (PhC)/ Clinical Pharmacy Specialist

## 2013-05-29 VITALS — BP 112/80 | HR 96

## 2013-05-29 DIAGNOSIS — I480 Paroxysmal atrial fibrillation: Secondary | ICD-10-CM

## 2013-05-29 DIAGNOSIS — I4891 Unspecified atrial fibrillation: Secondary | ICD-10-CM

## 2013-05-29 DIAGNOSIS — Z7901 Long term (current) use of anticoagulants: Secondary | ICD-10-CM

## 2013-05-31 ENCOUNTER — Other Ambulatory Visit: Payer: Self-pay | Admitting: Pharmacist Clinician (PhC)/ Clinical Pharmacy Specialist

## 2013-06-26 ENCOUNTER — Ambulatory Visit (INDEPENDENT_AMBULATORY_CARE_PROVIDER_SITE_OTHER): Payer: Medicare Other | Admitting: Pharmacist Clinician (PhC)/ Clinical Pharmacy Specialist

## 2013-06-26 VITALS — BP 120/80 | HR 84

## 2013-06-26 DIAGNOSIS — I480 Paroxysmal atrial fibrillation: Secondary | ICD-10-CM

## 2013-06-26 DIAGNOSIS — Z7901 Long term (current) use of anticoagulants: Secondary | ICD-10-CM

## 2013-06-26 DIAGNOSIS — I4891 Unspecified atrial fibrillation: Secondary | ICD-10-CM

## 2013-06-26 LAB — POCT INR: INR: 2.6

## 2013-07-17 ENCOUNTER — Encounter: Payer: Self-pay | Admitting: Cardiovascular Disease

## 2013-07-17 ENCOUNTER — Ambulatory Visit (INDEPENDENT_AMBULATORY_CARE_PROVIDER_SITE_OTHER): Payer: Medicare Other | Admitting: Cardiovascular Disease

## 2013-07-17 VITALS — BP 124/60 | HR 60 | Ht 61.0 in | Wt 137.4 lb

## 2013-07-17 DIAGNOSIS — I471 Supraventricular tachycardia, unspecified: Secondary | ICD-10-CM

## 2013-07-17 DIAGNOSIS — I480 Paroxysmal atrial fibrillation: Secondary | ICD-10-CM

## 2013-07-17 DIAGNOSIS — I4719 Other supraventricular tachycardia: Secondary | ICD-10-CM

## 2013-07-17 DIAGNOSIS — Z950 Presence of cardiac pacemaker: Secondary | ICD-10-CM

## 2013-07-17 DIAGNOSIS — I4891 Unspecified atrial fibrillation: Secondary | ICD-10-CM

## 2013-07-17 LAB — MDC_IDC_ENUM_SESS_TYPE_INCLINIC
Battery Voltage: 2.81 V
Brady Statistic RV Percent Paced: 1 % — CL
Implantable Pulse Generator Model: 5826
Lead Channel Impedance Value: 392 Ohm
Lead Channel Pacing Threshold Amplitude: 1 V
Lead Channel Sensing Intrinsic Amplitude: 1.2 mV
Lead Channel Sensing Intrinsic Amplitude: 4.5 mV
Lead Channel Setting Pacing Amplitude: 2 V
Lead Channel Setting Pacing Amplitude: 2.5 V

## 2013-07-17 LAB — PACEMAKER DEVICE OBSERVATION

## 2013-07-17 NOTE — Patient Instructions (Signed)
Your physician recommends that you schedule a follow-up appointment in: 3 month pacer check  Your physician recommends that you schedule a follow-up appointment in: 6 months with Dr Royann Shivers

## 2013-07-20 NOTE — Assessment & Plan Note (Signed)
After she spontaneously returned to atrial paced rhythm she feels better. She has more energy, less fatigued and less dyspnea. This contradicts her previous statements that she was asymptomatic. It also suggested she may benefit from future cardioversion and/or antiarrhythmic therapy, in case of arrhythmia recurrence. She remains as always very conservative and would like to minimize the number of medications in procedures that she is exposed to. She should remain on lifelong warfarin anticoagulation barring any bleeding problems. She has had difficult rate control in the past and for this reason she is taking both beta blockers and diltiazem.

## 2013-07-20 NOTE — Assessment & Plan Note (Signed)
Pacemaker check in clinic (industry checked). Normal device function. Thresholds, sensing, impedances consistent with previous measurements. Device programmed to maximize longevity. 17 mode switches (84%)---max dur 22 days + Warfarin. No high ventricular rates noted. Device programmed at appropriate safety margins. Histogram distribution appropriate for patient activity level. Device programmed to optimize intrinsic conduction. Estimated longevity 2.25-3.75 years (with EGMs)---4.25-7.25 years (without EGMs). EGM recordings were therefore turned off to prolong device longevity. Patient will follow up with the device clinic in 3 months and with MD in 6 months.

## 2013-07-20 NOTE — Progress Notes (Signed)
Patient ID: Victoria [name], female   DOB: [birth year], 79 y.o.   MRN: [id number]      Reason for office visit Atrial fibrillation, pacemaker followup  This is a routine followup visit for Victoria [name] and interrogation of her pacemaker and her electrocardiogram today surprisingly showed resolution of long-standing atrial flutter. 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 We tried treatment with antiarrhythmics and drive pacing unsuccessfully . We have discussed cardioversion and amiodarone antiarrhythmic therapy but she stated that since she was asymptomatic she did not want to undergo more procedures. She was afraid of the side effects of amiodarone. Ventricular rate control is fairly difficult to achieve and required combination therapy with diltiazem and metoprolol. When she had her small bowel obstruction and had paroxysmal atrial tachycardia rate control was even more difficult However now she says that she feels better. Over the last several weeks. The improvement coincides with the return to atrial paced rhythm and resolution of atrial fibrillation. She has not had any embolic events or bleeding complications. She denies chest pain or shortness of breath.   Allergies  Allergen Reactions  . Pindolol Swelling    Throat swelled up  . Iodine Other (See Comments)    BLISTERS  . Shellfish Allergy   . Vibramycin [Doxycycline Calcium] Nausea Only  . Doxycycline Nausea Only  . Levofloxacin Other (See Comments)    DIZZINESS  . Multaq [Dronedarone]     CAUSED SEVERE BURNING GI TRACT  . Vicodin [Hydrocodone-Acetaminophen] Other (See Comments)    dizziness    Current Outpatient Prescriptions  Medication Sig Dispense Refill  . Cholecalciferol 2000 UNITS TABS Take 2,000 Units by mouth daily.      Marland Kitchen diltiazem (CARDIZEM CD) 240 MG 24 hr capsule take 1 capsule by mouth once daily  90 capsule  3  .  feeding supplement (ENSURE IMMUNE HEALTH) LIQD Take 237 mLs by mouth daily.       Marland Kitchen lactose free nutrition (BOOST) LIQD Take 237 mLs by mouth daily.      . metoprolol succinate (TOPROL-XL) 50 MG 24 hr tablet Take 1 tablet (50 mg total) by mouth every morning.  90 tablet  3  . ondansetron (ZOFRAN) 4 MG tablet Take 4 mg by mouth every 8 (eight) hours as needed for nausea.       . simvastatin (ZOCOR) 20 MG tablet Take 20 mg by mouth every evening.      . warfarin (COUMADIN) 5 MG tablet take 1 tablet by mouth once daily or as directed  90 tablet  3   No current facility-administered medications for this visit.   Facility-Administered Medications Ordered in Other Visits  Medication Dose Route Frequency Provider Last Rate Last Dose  . heparin 6,000 Units in sodium chloride irrigation 0.9 % 500 mL irrigation   Irrigation Once Emelia Loron, MD      . sodium chloride 0.9 % injection 10 mL  10 mL Intracatheter PRN Ladene Artist, MD   10 mL at 07/18/12 1248    Past Medical History  Diagnosis Date  . Hyperlipidemia   . Rectal carcinoma   . Colon cancer 03/13/12 egd    rectal mass5cm  , 2-3cm proximal to anal verge  . Diverticulosis   . Allergy   . Anxiety     rhematoid  . Colostomy in place   . PONV (postoperative nausea and vomiting)   . Chronic anticoagulation   .  Fluid collection (edema) in the arms, legs, hands and feet   . Urinary frequency     with burning x 2 days  . History of blood transfusion   . Hypertension   . Pacemaker   . Coronary artery disease     DR. Srihaan Mastrangelo IS PT'S CARDIOLOGIST  . Atrial fibrillation   . Sinus node dysfunction 09/10/2008    Pacemaker [employer]  . RBBB   . PAT (paroxysmal atrial tachycardia)     Past Surgical History  Procedure Laterality Date  . Permanent pacemaker insertion  09/10/2008    [employer]  . Rib removed  1934    Pleurisy and pneumonia  . Partial hysterectomy  1972  . Bladder neck suspension  1986  . Heart catherization  [id number]    . Abdominal hysterectomy      both ovaries intact,   . Colon resection  04/04/2012    Procedure: COLON RESECTION;  Surgeon: Emelia Loron, MD;  Location: [facility];  Service: General;  Laterality: N/A;  laparotomy with small bowel resection for obstruction with colostomy with gastrostomy tube placement.   . Portacath placement  05/04/2012    Procedure: INSERTION PORT-A-CATH;  Surgeon: Emelia Loron, MD;  Location: [facility];  Service: General;  Laterality: Right;  . Insert / replace / remove pacemaker  2/10  . Laparotomy Bilateral 10/03/2012    Procedure: EXPLORATORY LAPAROTOMY;  Surgeon: Rejeana Brock A. Duard Brady, MD;  Location: [facility];  Service: Gynecology;  Laterality: Bilateral;  WITH BSO, TUMOR DEBULKING  . Colostomy revision N/A 10/03/2012    Procedure: COLOSTOMY REVISION;  Surgeon: Emelia Loron, MD;  Location: [facility];  Service: General;  Laterality: N/A;  COLOSTOMY REVISION, Sigmoid colectomy  . US echocardiography  07/17/2008    EF =>55%,mild mitral annular ca+,mild-mod MR,TR,AOV mildly sclerotic  . Nm myoview ltd  07/17/2008    no ischemia  . Port-a-cath removal N/A 02/21/2013    Procedure: REMOVAL PORT-A-CATH;  Surgeon: Emelia Loron, MD;  Location: [facility];  Service: General;  Laterality: N/A;    Family History  Problem Relation Age of Onset  . Heart disease Mother     History   Social History  . Marital Status: Married    Spouse Name: N/A    Number of Children: 2  . Years of Education: N/A   Occupational History  . Retired     Armed forces operational officer,     Social History Main Topics  . Smoking status: Never Smoker   . Smokeless tobacco: Never Used  . Alcohol Use: No  . Drug Use: No  . Sexual Activity: Not on file   Other Topics Concern  . Not on file   Social History Narrative  . No narrative on file    Review of systems: The patient specifically denies any chest pain at rest or with exertion, dyspnea at rest or with exertion, orthopnea, paroxysmal nocturnal dyspnea,  syncope, palpitations, focal neurological deficits, intermittent claudication, lower extremity edema, unexplained weight gain, cough, hemoptysis or wheezing.  The patient also denies abdominal pain, nausea, vomiting, dysphagia, diarrhea, constipation, polyuria, polydipsia, dysuria, hematuria, frequency, urgency, abnormal bleeding or bruising, fever, chills, unexpected weight changes, mood swings, change in skin or hair texture, change in voice quality, auditory or visual problems, allergic reactions or rashes, new musculoskeletal complaints other than usual "aches and pains".   PHYSICAL EXAM BP 124/60  Pulse 60  Ht 5\' 1"  (1.549 m)  Wt 137 lb 6.4 oz (62.324 kg)  BMI 25.97 kg/m2 General: Alert, oriented x3, no  distress  Head: no evidence of trauma, PERRL, EOMI, no exophtalmos or lid lag, no myxedema, no xanthelasma; normal ears, nose and oropharynx  Neck: normal jugular venous pulsations and no hepatojugular reflux; brisk carotid pulses without delay and no carotid bruits  Chest: clear to auscultation, no signs of consolidation by percussion or palpation, normal fremitus, symmetrical and full respiratory excursions  Cardiovascular: normal position and quality of the apical impulse, irregular rhythm, normal first and second heart sounds, no murmurs, rubs or gallops  Abdomen: no tenderness or distention, no masses by palpation, no abnormal pulsatility or arterial bruits, normal bowel sounds, no hepatosplenomegaly; colostomy  Extremities: no clubbing, cyanosis or edema; 2+ radial, ulnar and brachial pulses bilaterally; 2+ right femoral, posterior tibial and dorsalis pedis pulses; 2+ left femoral, posterior tibial and dorsalis pedis pulses; no subclavian or femoral bruits  Neurological: grossly nonfocal   EKG: Atrial paced ventricular sensed, chronic right bundle branch  Lipid Panel     Component Value Date/Time   CHOL 115 04/10/2012 0520   TRIG 80 04/10/2012 0520   HDL 43 05/15/2008 0620    CHOLHDL 3.3 05/15/2008 0620   VLDL 17 05/15/2008 0620   LDLCALC  Value: 82        Total Cholesterol/HDL:CHD Risk Coronary Heart Disease Risk Table                     Men   Women  1/2 Average Risk   3.4   3.3 05/15/2008 0620    BMET    Component Value Date/Time   NA 135 02/13/2013 0915   NA 135* 11/21/2012 0920   K 4.2 02/13/2013 0915   K 4.3 11/21/2012 0920   CL 101 02/13/2013 0915   CL 103 11/21/2012 0920   CO2 27 02/13/2013 0915   CO2 25 11/21/2012 0920   GLUCOSE 100* 02/13/2013 0915   GLUCOSE 101* 11/21/2012 0920   BUN 18 02/13/2013 0915   BUN 13.3 11/21/2012 0920   CREATININE 0.91 02/13/2013 0915   CREATININE 0.8 11/21/2012 0920   CALCIUM 9.1 02/13/2013 0915   CALCIUM 8.7 11/21/2012 0920   GFRNONAA 57* 02/13/2013 0915   GFRAA 66* 02/13/2013 0915     ASSESSMENT AND PLAN PAF fib/flutter- Hx RFA 2010 After she spontaneously returned to atrial paced rhythm she feels better. She has more energy, less fatigued and less dyspnea. This contradicts her previous statements that she was asymptomatic. It also suggested she may benefit from future cardioversion and/or antiarrhythmic therapy, in case of arrhythmia recurrence. She remains as always very conservative and would like to minimize the number of medications in procedures that she is exposed to. She should remain on lifelong warfarin anticoagulation barring any bleeding problems. She has had difficult rate control in the past and for this reason she is taking both beta blockers and diltiazem.  S/P [facility] Jude Pace Feb 2006 after failed RFA Pacemaker check in clinic (industry checked). Normal device function. Thresholds, sensing, impedances consistent with previous measurements. Device programmed to maximize longevity. 17 mode switches (84%)---max dur 22 days + Warfarin. No high ventricular rates noted. Device programmed at appropriate safety margins. Histogram distribution appropriate for patient activity level. Device programmed to optimize intrinsic  conduction. Estimated longevity 2.25-3.75 years (with EGMs)---4.25-7.25 years (without EGMs). EGM recordings were therefore turned off to prolong device longevity. Patient will follow up with the device clinic in 3 months and with MD in 6 months.   Orders Placed This Encounter  Procedures  . Implantable device  check  . EKG 12-Lead   No orders of the defined types were placed in this encounter.    Junious Silk, MD, [facility] [phone] office [phone] pager

## 2013-07-23 ENCOUNTER — Encounter: Payer: Self-pay | Admitting: Cardiovascular Disease

## 2013-07-24 ENCOUNTER — Ambulatory Visit (HOSPITAL_BASED_OUTPATIENT_CLINIC_OR_DEPARTMENT_OTHER): Payer: Medicare Other | Admitting: Oncology

## 2013-07-24 ENCOUNTER — Telehealth: Payer: Self-pay | Admitting: *Deleted

## 2013-07-24 ENCOUNTER — Other Ambulatory Visit (HOSPITAL_BASED_OUTPATIENT_CLINIC_OR_DEPARTMENT_OTHER): Payer: Medicare Other

## 2013-07-24 VITALS — BP 112/65 | HR 60 | Temp 97.4°F | Resp 18 | Ht 61.0 in | Wt 140.3 lb

## 2013-07-24 DIAGNOSIS — C785 Secondary malignant neoplasm of large intestine and rectum: Secondary | ICD-10-CM

## 2013-07-24 DIAGNOSIS — C569 Malignant neoplasm of unspecified ovary: Secondary | ICD-10-CM

## 2013-07-24 NOTE — Progress Notes (Signed)
GYN has referred her to [facility] to be seen on 08/06/13 for recurrent UTI's.

## 2013-07-24 NOTE — Progress Notes (Signed)
   [facility]    OFFICE PROGRESS NOTE   INTERVAL HISTORY:   [name] returns for scheduled followup of ovarian cancer. She feels well. No difficulty with bowel function. She reports episodes of recurrent dysuria and a small amount of hematuria. She reports a recent urine culture was negative. She has been referred to Dr. Mena Goes. No difficulty with bowel function.  Objective:  Vital signs in last 24 hours:  Blood pressure 112/65, pulse 60, temperature 97.4 F (36.3 C), temperature source Oral, resp. rate 18, height 5\' 1"  (1.549 m), weight 140 lb 4.8 oz (63.64 kg), SpO2 99.00%.    HEENT: Neck without mass Lymphatics: No cervical, supraclavicular, axillary, or inguinal nodes Resp: Decreased breath sounds at the right lower chest, no respiratory distress Cardio: Regular rate and rhythm GI: Left lower quadrant colostomy with a parastomal hernia. No hepatosplenomegaly, no apparent ascites, nontender, no mass Vascular: No leg edema   Lab Results:  CA 125 pending   Medications: I have reviewed the patient's current medications.  Assessment/Plan: 1. Ovarian cancer-presenting with a rectal mass , status post sigmoidoscopy 03/13/2012 with findings of a 5 cm malignant appearing mass with central ulceration in the rectum 2-3 cm proximal to the anal verge. Pathology showed moderate to poorly differentiated adenocarcinoma with ulceration and prolapse changes. By immunohistochemistry the malignant cells were positive for cytokeratin 7, estrogen receptor and WT-1. They were negative for cytokeratin 20 and CDX-2. This immunohistochemical profile was strongly suggestive of a gynecologic primary. -Status post 3 cycles of Taxol/carboplatin chemotherapy with normalization of the CA 125, restaging CT 08/07/2012 with marked improvement in the pelvic masses and no evidence of progressive ovarian cancer. She completed cycle 4 of Taxol/carboplatin beginning 08/08/2012. 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 She completed cycle 6 Taxol/carboplatin beginning 11/21/2012. 2. CT abdomen/pelvis on 03/02/2012 with a 6.7 cm mass along the right aspect of the rectum; 2 adjacent nodal masses in the right mid abdomen measuring 8.9 x 7.4 x 7.2 cm and an additional 3.0 x 3.0 x 2.6 cm nodal mass posterior to the cecum. 3. PET scan 03/22/2012 with an intensely hypermetabolic large mass along the right aspect of the rectum; an intensely hypermetabolic right iliac fossa mass; increased caliber of small bowel loops containing air-fluid levels. 4. Hospitalization with nausea/vomiting, abdominal distention due to a small bowel obstruction status post bypass procedure and descending loop colostomy, and gastrostomy tube placement 04/04/2012. The gastrostomy tube has been removed. 5. Atrial fibrillation maintained on Coumadin.  6. Status post Port-A-Cath placement 05/04/2012. Port-A-Cath removal 02/21/2013 7. History of neutropenia secondary to chemotherapy causing a treatment delay following cycle 2. The Taxol was dose reduced beginning with cycle 3. 8. 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 9. Dysuria/hematuria-? Related to Coumadin,? Infectious,? Recurrent ovarian cancer   Disposition:  Ms. Kos remains in clinical remission from ovarian cancer. We will followup on the CA 125 today. She has been referred to urology for evaluation of the dysuria/hematuria. She will return for an office and lab visit in 3 months.   Thornton Papas, MD  07/24/2013  3:50 PM

## 2013-07-24 NOTE — Telephone Encounter (Signed)
appts made and printed...td 

## 2013-07-25 ENCOUNTER — Telehealth: Payer: Self-pay | Admitting: *Deleted

## 2013-07-25 NOTE — Telephone Encounter (Signed)
Message copied by Raphael Gibney on Wed Jul 25, 2013 11:33 AM ------      Message from: Thornton Papas B      Created: Wed Jul 25, 2013 11:12 AM       Please call patient, ca125 is normal ------

## 2013-07-25 NOTE — Telephone Encounter (Signed)
Left message for patient to call back regarding lab results.

## 2013-07-25 NOTE — Telephone Encounter (Signed)
Called and informed patient of normal ca 125.  Per Dr. Sherrill.  Patient verbalized understanding.  

## 2013-07-26 DIAGNOSIS — R110 Nausea: Secondary | ICD-10-CM

## 2013-07-26 HISTORY — DX: Nausea: R11.0

## 2013-08-07 ENCOUNTER — Ambulatory Visit (INDEPENDENT_AMBULATORY_CARE_PROVIDER_SITE_OTHER): Payer: Medicare Other | Admitting: Pharmacist Clinician (PhC)/ Clinical Pharmacy Specialist

## 2013-08-07 VITALS — BP 140/70 | HR 72

## 2013-08-07 DIAGNOSIS — Z7901 Long term (current) use of anticoagulants: Secondary | ICD-10-CM

## 2013-08-07 DIAGNOSIS — I4891 Unspecified atrial fibrillation: Secondary | ICD-10-CM

## 2013-08-07 DIAGNOSIS — I480 Paroxysmal atrial fibrillation: Secondary | ICD-10-CM

## 2013-08-07 LAB — POCT INR: INR: 1.6

## 2013-08-28 ENCOUNTER — Ambulatory Visit (INDEPENDENT_AMBULATORY_CARE_PROVIDER_SITE_OTHER): Payer: Medicare Other | Admitting: Pharmacist Clinician (PhC)/ Clinical Pharmacy Specialist

## 2013-08-28 VITALS — BP 132/70 | HR 68

## 2013-08-28 DIAGNOSIS — I480 Paroxysmal atrial fibrillation: Secondary | ICD-10-CM

## 2013-08-28 DIAGNOSIS — I4891 Unspecified atrial fibrillation: Secondary | ICD-10-CM

## 2013-08-28 DIAGNOSIS — Z7901 Long term (current) use of anticoagulants: Secondary | ICD-10-CM

## 2013-08-28 LAB — POCT INR: INR: 2.5

## 2013-09-02 ENCOUNTER — Encounter (HOSPITAL_COMMUNITY): Payer: Self-pay | Admitting: Emergency Medicine

## 2013-09-02 ENCOUNTER — Inpatient Hospital Stay (HOSPITAL_COMMUNITY)
Admission: EM | Admit: 2013-09-02 | Discharge: 2013-09-07 | DRG: 669 | Disposition: A | Discharge status: Home / Self Care | Payer: Medicare Other | Attending: Internal Medicine | Admitting: Internal Medicine

## 2013-09-02 DIAGNOSIS — Z950 Presence of cardiac pacemaker: Secondary | ICD-10-CM

## 2013-09-02 DIAGNOSIS — E46 Unspecified protein-calorie malnutrition: Secondary | ICD-10-CM | POA: Diagnosis present

## 2013-09-02 DIAGNOSIS — D62 Acute posthemorrhagic anemia: Secondary | ICD-10-CM | POA: Diagnosis not present

## 2013-09-02 DIAGNOSIS — C189 Malignant neoplasm of colon, unspecified: Secondary | ICD-10-CM

## 2013-09-02 DIAGNOSIS — Z79899 Other long term (current) drug therapy: Secondary | ICD-10-CM

## 2013-09-02 DIAGNOSIS — E441 Mild protein-calorie malnutrition: Secondary | ICD-10-CM | POA: Diagnosis present

## 2013-09-02 DIAGNOSIS — E860 Dehydration: Secondary | ICD-10-CM

## 2013-09-02 DIAGNOSIS — K5790 Diverticulosis of intestine, part unspecified, without perforation or abscess without bleeding: Secondary | ICD-10-CM

## 2013-09-02 DIAGNOSIS — Z66 Do not resuscitate: Secondary | ICD-10-CM | POA: Diagnosis not present

## 2013-09-02 DIAGNOSIS — Z9049 Acquired absence of other specified parts of digestive tract: Secondary | ICD-10-CM

## 2013-09-02 DIAGNOSIS — Z90710 Acquired absence of both cervix and uterus: Secondary | ICD-10-CM

## 2013-09-02 DIAGNOSIS — R310 Gross hematuria: Secondary | ICD-10-CM | POA: Diagnosis present

## 2013-09-02 DIAGNOSIS — C569 Malignant neoplasm of unspecified ovary: Secondary | ICD-10-CM

## 2013-09-02 DIAGNOSIS — R1900 Intra-abdominal and pelvic swelling, mass and lump, unspecified site: Secondary | ICD-10-CM

## 2013-09-02 DIAGNOSIS — Z933 Colostomy status: Secondary | ICD-10-CM

## 2013-09-02 DIAGNOSIS — I2510 Atherosclerotic heart disease of native coronary artery without angina pectoris: Secondary | ICD-10-CM | POA: Diagnosis present

## 2013-09-02 DIAGNOSIS — I480 Paroxysmal atrial fibrillation: Secondary | ICD-10-CM | POA: Diagnosis present

## 2013-09-02 DIAGNOSIS — R05 Cough: Secondary | ICD-10-CM

## 2013-09-02 DIAGNOSIS — Z8249 Family history of ischemic heart disease and other diseases of the circulatory system: Secondary | ICD-10-CM

## 2013-09-02 DIAGNOSIS — I471 Supraventricular tachycardia: Secondary | ICD-10-CM

## 2013-09-02 DIAGNOSIS — R197 Diarrhea, unspecified: Secondary | ICD-10-CM

## 2013-09-02 DIAGNOSIS — N3289 Other specified disorders of bladder: Secondary | ICD-10-CM | POA: Diagnosis present

## 2013-09-02 DIAGNOSIS — E871 Hypo-osmolality and hyponatremia: Secondary | ICD-10-CM

## 2013-09-02 DIAGNOSIS — K5730 Diverticulosis of large intestine without perforation or abscess without bleeding: Secondary | ICD-10-CM | POA: Diagnosis present

## 2013-09-02 DIAGNOSIS — R059 Cough, unspecified: Secondary | ICD-10-CM

## 2013-09-02 DIAGNOSIS — C679 Malignant neoplasm of bladder, unspecified: Principal | ICD-10-CM | POA: Diagnosis present

## 2013-09-02 DIAGNOSIS — E785 Hyperlipidemia, unspecified: Secondary | ICD-10-CM | POA: Diagnosis present

## 2013-09-02 DIAGNOSIS — K56609 Unspecified intestinal obstruction, unspecified as to partial versus complete obstruction: Secondary | ICD-10-CM

## 2013-09-02 DIAGNOSIS — Z9221 Personal history of antineoplastic chemotherapy: Secondary | ICD-10-CM

## 2013-09-02 DIAGNOSIS — D649 Anemia, unspecified: Secondary | ICD-10-CM

## 2013-09-02 DIAGNOSIS — Z7901 Long term (current) use of anticoagulants: Secondary | ICD-10-CM

## 2013-09-02 DIAGNOSIS — Z8543 Personal history of malignant neoplasm of ovary: Secondary | ICD-10-CM

## 2013-09-02 DIAGNOSIS — I4891 Unspecified atrial fibrillation: Secondary | ICD-10-CM | POA: Diagnosis present

## 2013-09-02 DIAGNOSIS — R319 Hematuria, unspecified: Secondary | ICD-10-CM

## 2013-09-02 DIAGNOSIS — D72829 Elevated white blood cell count, unspecified: Secondary | ICD-10-CM

## 2013-09-02 DIAGNOSIS — C20 Malignant neoplasm of rectum: Secondary | ICD-10-CM

## 2013-09-02 DIAGNOSIS — Z6826 Body mass index (BMI) 26.0-26.9, adult: Secondary | ICD-10-CM

## 2013-09-02 LAB — URINALYSIS, ROUTINE W REFLEX MICROSCOPIC
BILIRUBIN URINE: NEGATIVE
GLUCOSE, UA: NEGATIVE mg/dL
KETONES UR: NEGATIVE mg/dL
Nitrite: NEGATIVE
Protein, ur: >300 mg/dL — AB
Specific Gravity, Urine: 1.029 (ref 1.005–1.030)
UROBILINOGEN UA: 0.2 mg/dL (ref 0.0–1.0)
pH: 7 (ref 5.0–8.0)

## 2013-09-02 LAB — BASIC METABOLIC PANEL
BUN: 13 mg/dL (ref 6–23)
CO2: 22 mEq/L (ref 19–32)
CREATININE: 0.91 mg/dL (ref 0.50–1.10)
Calcium: 8.7 mg/dL (ref 8.4–10.5)
Chloride: 98 mEq/L (ref 96–112)
GFR calc Af Amer: 65 mL/min — ABNORMAL LOW (ref >90)
GFR calc non Af Amer: 56 mL/min — ABNORMAL LOW (ref >90)
Glucose, Bld: 123 mg/dL — ABNORMAL HIGH (ref 70–99)
POTASSIUM: 4.1 meq/L (ref 3.7–5.3)
Sodium: 133 mEq/L — ABNORMAL LOW (ref 137–147)

## 2013-09-02 LAB — CBC
HCT: 30.1 % — ABNORMAL LOW (ref 36.0–46.0)
HEMATOCRIT: 34.2 % — AB (ref 36.0–46.0)
HEMOGLOBIN: 10.3 g/dL — AB (ref 12.0–15.0)
Hemoglobin: 11.9 g/dL — ABNORMAL LOW (ref 12.0–15.0)
MCH: 32.7 pg (ref 26.0–34.0)
MCH: 32.2 pg (ref 26.0–34.0)
MCHC: 34.8 g/dL (ref 30.0–36.0)
MCHC: 34.2 g/dL (ref 30.0–36.0)
MCV: 94 fL (ref 78.0–100.0)
MCV: 94.1 fL (ref 78.0–100.0)
Platelets: 210 10*3/uL (ref 150–400)
Platelets: 181 10*3/uL (ref 150–400)
RBC: 3.64 MIL/uL — ABNORMAL LOW (ref 3.87–5.11)
RBC: 3.2 MIL/uL — AB (ref 3.87–5.11)
RDW: 13.7 % (ref 11.5–15.5)
RDW: 13.6 % (ref 11.5–15.5)
WBC: 8.1 10*3/uL (ref 4.0–10.5)
WBC: 8.5 10*3/uL (ref 4.0–10.5)

## 2013-09-02 LAB — PROTIME-INR
INR: 2.64 — AB (ref 0.00–1.49)
PROTHROMBIN TIME: 27.3 s — AB (ref 11.6–15.2)

## 2013-09-02 LAB — URINE MICROSCOPIC-ADD ON

## 2013-09-02 MED ORDER — SODIUM CHLORIDE 0.9 % IJ SOLN
3.0000 mL | Freq: Two times a day (BID) | INTRAMUSCULAR | Status: DC
Start: 2013-09-02 — End: 2013-09-07
  Administered 2013-09-02 – 2013-09-06 (×8): 3 mL via INTRAVENOUS
  Administered 2013-09-06: 12:00:00 via INTRAVENOUS
  Administered 2013-09-07: 3 mL via INTRAVENOUS

## 2013-09-02 MED ORDER — DILTIAZEM HCL 60 MG PO TABS
60.0000 mg | ORAL_TABLET | Freq: Four times a day (QID) | ORAL | Status: DC
  Administered 2013-09-03 – 2013-09-07 (×7): 60 mg via ORAL
  Filled 2013-09-02 (×24): qty 1

## 2013-09-02 MED ORDER — ACETAMINOPHEN 650 MG RE SUPP: 650.0000 mg | Freq: Four times a day (QID) | RECTAL | Status: DC | PRN

## 2013-09-02 MED ORDER — VITAMIN K1 10 MG/ML IJ SOLN
5.0000 mg | Freq: Once | INTRAMUSCULAR | Status: DC
  Filled 2013-09-02: qty 0.5

## 2013-09-02 MED ORDER — ZOLPIDEM TARTRATE 5 MG PO TABS: 5.0000 mg | ORAL_TABLET | Freq: Every evening | ORAL | Status: DC | PRN

## 2013-09-02 MED ORDER — SODIUM CHLORIDE 0.9 % IV SOLN
INTRAVENOUS | Status: DC
  Administered 2013-09-02: 17:00:00 via INTRAVENOUS

## 2013-09-02 MED ORDER — ENSURE COMPLETE PO LIQD
237.0000 mL | Freq: Three times a day (TID) | ORAL | Status: DC
  Administered 2013-09-03 – 2013-09-06 (×6): 237 mL via ORAL

## 2013-09-02 MED ORDER — VITAMIN K1 10 MG/ML IJ SOLN
2.0000 mg | Freq: Once | INTRAMUSCULAR | Status: AC
  Administered 2013-09-02: 2 mg via SUBCUTANEOUS
  Filled 2013-09-02: qty 0.2

## 2013-09-02 MED ORDER — ACETAMINOPHEN 325 MG PO TABS
650.0000 mg | ORAL_TABLET | Freq: Four times a day (QID) | ORAL | Status: DC | PRN
  Filled 2013-09-02: qty 2

## 2013-09-02 MED ORDER — SODIUM CHLORIDE 0.9 % IV SOLN
INTRAVENOUS | Status: DC
  Administered 2013-09-02 – 2013-09-03 (×2): 75 mL via INTRAVENOUS
  Administered 2013-09-04: 21:00:00 via INTRAVENOUS
  Administered 2013-09-05: 500 mL via INTRAVENOUS

## 2013-09-02 MED ORDER — BOOST PLUS PO LIQD
237.0000 mL | Freq: Every day | ORAL | Status: DC
  Administered 2013-09-04 – 2013-09-07 (×3): 237 mL via ORAL
  Filled 2013-09-02 (×5): qty 237

## 2013-09-02 NOTE — ED Notes (Signed)
Bed: HF41 Expected date: 09/02/13 Expected time: 2:10 PM Means of arrival:  Comments: Hematuria

## 2013-09-02 NOTE — ED Notes (Signed)
New drainage bag placed some bloody output

## 2013-09-02 NOTE — ED Provider Notes (Signed)
CSN: [id number]     Arrival date & time 09/02/13  1418 History   First MD Initiated Contact with Patient 09/02/13 1510     Chief Complaint  Patient presents with  . Hematuria   (Consider location/radiation/quality/duration/timing/severity/associated sxs/prior Treatment) Patient is a 80 y.o. female presenting with hematuria. The history is provided by the patient and a relative.  Hematuria   Pt here with hematuria x 3 days--seen by urology 2 days ago and catheter placed and abd/pelvis ct performed in the office which showed a mas--pt with worsening bloody urine that has leaked around the catheter--denies fever, chills, weakness, pain, or dizziness--sx have been persistent and pt denies abd pain--no tx used pta Past Medical History  Diagnosis Date  . Hyperlipidemia   . Rectal carcinoma   . Colon cancer 03/13/12 egd    rectal mass5cm  , 2-3cm proximal to anal verge  . Diverticulosis   . Allergy   . Anxiety     rhematoid  . Colostomy in place   . PONV (postoperative nausea and vomiting)   . Chronic anticoagulation   . Fluid collection (edema) in the arms, legs, hands and feet   . Urinary frequency     with burning x 2 days  . History of blood transfusion   . Hypertension   . Pacemaker   . Coronary artery disease     DR. CROITORU IS PT'S CARDIOLOGIST  . Atrial fibrillation   . Sinus node dysfunction 09/10/2008    Pacemaker [employer]  . RBBB   . PAT (paroxysmal atrial tachycardia)    Past Surgical History  Procedure Laterality Date  . Permanent pacemaker insertion  09/10/2008    [employer]  . Rib removed  1934    Pleurisy and pneumonia  . Partial hysterectomy  1972  . Bladder neck suspension  1986  . Heart catherization  [id number]  . Abdominal hysterectomy      both ovaries intact,   . Colon resection  04/04/2012    Procedure: COLON RESECTION;  Surgeon: Rolm Bookbinder, MD;  Location: [facility];  Service: General;  Laterality: N/A;  laparotomy with small bowel resection for  obstruction with colostomy with gastrostomy tube placement.   . Portacath placement  05/04/2012    Procedure: INSERTION PORT-A-CATH;  Surgeon: Rolm Bookbinder, MD;  Location: [facility];  Service: General;  Laterality: Right;  . Insert / replace / remove pacemaker  2/10  . Laparotomy Bilateral 10/03/2012    Procedure: EXPLORATORY LAPAROTOMY;  Surgeon: Imagene Gurney A. Alycia Rossetti, MD;  Location: [facility];  Service: Gynecology;  Laterality: Bilateral;  WITH BSO, TUMOR DEBULKING  . Colostomy revision N/A 10/03/2012    Procedure: COLOSTOMY REVISION;  Surgeon: Rolm Bookbinder, MD;  Location: [facility];  Service: General;  Laterality: N/A;  COLOSTOMY REVISION, Sigmoid colectomy  . US echocardiography  07/17/2008    EF =>55%,mild mitral annular ca+,mild-mod MR,TR,AOV mildly sclerotic  . Nm myoview ltd  07/17/2008    no ischemia  . Port-a-cath removal N/A 02/21/2013    Procedure: REMOVAL PORT-A-CATH;  Surgeon: Rolm Bookbinder, MD;  Location: [facility];  Service: General;  Laterality: N/A;   Family History  Problem Relation Age of Onset  . Heart disease Mother    History  Substance Use Topics  . Smoking status: Never Smoker   . Smokeless tobacco: Never Used  . Alcohol Use: No   OB History   Grav Para Term Preterm Abortions TAB SAB Ect Mult Living   2 2  Obstetric Comments   1st menses age 43, 1st pregnancy age 89     Review of Systems  Genitourinary: Positive for hematuria.  All other systems reviewed and are negative.    Allergies  Pindolol; Iodine; Shellfish allergy; Vibramycin; Doxycycline; Levofloxacin; Multaq; and Vicodin  Home Medications   Current Outpatient Rx  Name  Route  Sig  Dispense  Refill  . Cholecalciferol 2000 UNITS TABS   Oral   Take 2,000 Units by mouth daily.         Marland Kitchen diltiazem (CARDIZEM CD) 240 MG 24 hr capsule      take 1 capsule by mouth once daily   90 capsule   3   . feeding supplement (ENSURE IMMUNE HEALTH) LIQD   Oral   Take 237 mLs by mouth daily.           Marland Kitchen lactose free nutrition (BOOST) LIQD   Oral   Take 237 mLs by mouth daily.         . metoprolol succinate (TOPROL-XL) 50 MG 24 hr tablet   Oral   Take 1 tablet (50 mg total) by mouth every morning.   90 tablet   3   . ondansetron (ZOFRAN) 4 MG tablet   Oral   Take 4 mg by mouth every 8 (eight) hours as needed for nausea.          . simvastatin (ZOCOR) 20 MG tablet   Oral   Take 20 mg by mouth every evening.         . warfarin (COUMADIN) 5 MG tablet      take 1 tablet by mouth once daily or as directed   90 tablet   3    BP 120/58  Pulse 64  Temp(Src) 98.7 F (37.1 C) (Oral)  Resp 20  SpO2 96% Physical Exam  Nursing note and vitals reviewed. Constitutional: She is oriented to person, place, and time. She appears well-developed and well-nourished.  Non-toxic appearance. No distress.  HENT:  Head: Normocephalic and atraumatic.  Eyes: Conjunctivae, EOM and lids are normal. Pupils are equal, round, and reactive to light.  Neck: Normal range of motion. Neck supple. No tracheal deviation present. No mass present.  Cardiovascular: Normal rate, regular rhythm and normal heart sounds.  Exam reveals no gallop.   No murmur heard. Pulmonary/Chest: Effort normal and breath sounds normal. No stridor. No respiratory distress. She has no decreased breath sounds. She has no wheezes. She has no rhonchi. She has no rales.  Abdominal: Soft. Normal appearance and bowel sounds are normal. She exhibits no distension. There is no tenderness. There is no rebound and no CVA tenderness.    Musculoskeletal: Normal range of motion. She exhibits no edema and no tenderness.  Neurological: She is alert and oriented to person, place, and time. She has normal strength. No cranial nerve deficit or sensory deficit. GCS eye subscore is 4. GCS verbal subscore is 5. GCS motor subscore is 6.  Skin: Skin is warm and dry. No abrasion and no rash noted.  Psychiatric: She has a normal mood and  affect. Her speech is normal and behavior is normal.    ED Course  Procedures (including critical care time) Labs Review Labs Reviewed  CBC - Abnormal; Notable for the following:    RBC 3.64 (*)    Hemoglobin 11.9 (*)    HCT 34.2 (*)    All other components within normal limits  URINE CULTURE  URINALYSIS, ROUTINE W REFLEX MICROSCOPIC  PROTIME-INR  BASIC METABOLIC PANEL   Imaging Review No results found.  EKG Interpretation   None       MDM  No diagnosis found. Spoke with urology on call and they request the patient admitted to medicine service. I spoke to the patient's medicine physician and he would patient    Leota Jacobsen, MD 09/02/13 1715

## 2013-09-02 NOTE — ED Notes (Addendum)
Pt from home c/o hematuria. She had a catheter placed om Friday for hematuria but has increased blood in urine starting this am. Hx of ovarian CA and recently had CT and showed additional mass. Denies pain, weakness and dizziness. Pt also reports vaginal bleeding.

## 2013-09-02 NOTE — ED Notes (Signed)
Urology at bedside.

## 2013-09-02 NOTE — H&P (Signed)
PCP:   Victoria Lopes, MD   Chief Complaint:  Gross hematuria leaking around catheter  HPI: [name] who has prior extensive ovarian cancer status post resection and treatment with chemotherapy followed by Dr. Learta [name], she also has a colostomy dating back to resection of her masses back in 2014, she was seen by her oncologist towards the end of December, noted to have worsening with sporadic gross hematuria, also evaluated by gynecology, referred to urology, was scheduled to be seen this coming week however developed worsening gross hematuria last week, seen by urology last week, Foley catheter placed, CT scan revealed very large pelvic mass with bladder involvement with the mass having central necrosis and rapid progression compared with previous year. Question primary bladder cancer versus recurrence of previous latency. Patient is status post total total abd hysterectomy and bilateral salpingo-oophorectomy. Over the weekend and especially today developed worsening gross hematuria leaking around her Foley catheter without clots, she is anticoagulated secondary to history of atrial fibrillation and with pacemaker placement. During the weekend, denies any nausea, vomiting, fevers, chills, chest pain, palpitations, presyncope, dizziness or other focal neurologic deficits. Denies blood in stool. She presented to the emergency room for further evaluation and management, seen by urology who noted therapeutic PT/INR, stable hemoglobin of 11.9, hemodynamic stability, and recommended admission by internal medicine with correction of anticoagulation with presumed surgery the left this coming week for tissue biopsy and definitive diagnosis of the mass.  Review of Systems:  Patient states that she feels well except for the bleeding, denies any significant fatigue, orthostasis, dizziness, negative for fevers, chills, visual complaints that are new, does have chronic issues with the ears, but  nonprogressive, denies swallowing difficulties, chest pain, shortness of breath, palpitations, wheezing, coughing denies nausea but positive for abdominal discomfort during pelvic exam by urology. Admits to gross hematuria and bleeding around her catheter, denies any significant muscular skeletal issues that are new, denies rash, focal neurologic deficits alert and oriented without memory issues.  Past Medical History: Past Medical History  Diagnosis Date  . Hyperlipidemia   . Rectal carcinoma   . Colon cancer 03/13/12 egd    rectal mass5cm  , 2-3cm proximal to anal verge  . Diverticulosis   . Allergy   . Anxiety     rhematoid  . Colostomy in place   . PONV (postoperative nausea and vomiting)   . Chronic anticoagulation   . Fluid collection (edema) in the arms, legs, hands and feet   . Urinary frequency     with burning x 2 days  . History of blood transfusion   . Hypertension   . Pacemaker   . Coronary artery disease     Victoria [name] IS PT'S CARDIOLOGIST  . Atrial fibrillation   . Sinus node dysfunction 09/10/2008    Pacemaker [employer]  . RBBB   . PAT (paroxysmal atrial tachycardia)    Past Surgical History  Procedure Laterality Date  . Permanent pacemaker insertion  09/10/2008    [employer]  . Rib removed  1934    Pleurisy and pneumonia  . Partial hysterectomy  1972  . Bladder neck suspension  1986  . Heart catherization  [id number]  . Abdominal hysterectomy      both ovaries intact,   . Colon resection  04/04/2012    Procedure: COLON RESECTION;  Surgeon: Victoria Bookbinder, MD;  Location: [facility];  Service: General;  Laterality: N/A;  laparotomy with small bowel resection for obstruction with colostomy with gastrostomy  tube placement.   . Portacath placement  05/04/2012    Procedure: INSERTION PORT-A-CATH;  Surgeon: Victoria Bookbinder, MD;  Location: [facility];  Service: General;  Laterality: Right;  . Insert / replace / remove pacemaker  2/10  . Laparotomy Bilateral 10/03/2012     Procedure: EXPLORATORY LAPAROTOMY;  Surgeon: Victoria Gurney A. Alycia Rossetti, MD;  Location: [facility];  Service: Gynecology;  Laterality: Bilateral;  WITH BSO, TUMOR DEBULKING  . Colostomy revision N/A 10/03/2012    Procedure: COLOSTOMY REVISION;  Surgeon: Victoria Bookbinder, MD;  Location: [facility];  Service: General;  Laterality: N/A;  COLOSTOMY REVISION, Sigmoid colectomy  . US echocardiography  07/17/2008    EF =>55%,mild mitral annular ca+,mild-mod MR,TR,AOV mildly sclerotic  . Nm myoview ltd  07/17/2008    no ischemia  . Port-a-cath removal N/A 02/21/2013    Procedure: REMOVAL PORT-A-CATH;  Surgeon: Victoria Bookbinder, MD;  Location: [facility];  Service: General;  Laterality: N/A;    Medications: Prior to Admission medications   Medication Sig Start Date End Date Taking? Authorizing Provider  Cholecalciferol 2000 UNITS TABS Take 2,000 Units by mouth daily.   Yes Victoria Fus, MD  diltiazem (CARDIZEM CD) 240 MG 24 hr capsule take 1 capsule by mouth once daily 02/27/13  Yes Victoria Croitoru, MD  feeding supplement (ENSURE IMMUNE HEALTH) LIQD Take 237 mLs by mouth daily.    Yes Historical Provider, MD  lactose free nutrition (BOOST) LIQD Take 237 mLs by mouth daily.   Yes Historical Provider, MD  metoprolol succinate (TOPROL-XL) 50 MG 24 hr tablet Take 1 tablet (50 mg total) by mouth every morning. 04/13/13  Yes Victoria Croitoru, MD  simvastatin (ZOCOR) 20 MG tablet Take 20 mg by mouth every evening.   Yes Historical Provider, MD  warfarin (COUMADIN) 5 MG tablet take 1 tablet by mouth once daily or as directed 05/31/13  Yes Victoria [name], Victoria [name]    Allergies:   Allergies  Allergen Reactions  . Pindolol Nausea Only and Swelling    Throat swelled up  . Iodine Other (See Comments)    BLISTERS  . Shellfish Allergy Diarrhea and Nausea And Vomiting  . Vibramycin [Doxycycline Calcium] Nausea Only and Other (See Comments)    dizziness  . Doxycycline Nausea Only  . Levofloxacin Other (See Comments)    DIZZINESS  .  Multaq [Dronedarone] Other (See Comments)    CAUSED SEVERE BURNING GI TRACT  . Vicodin [Hydrocodone-Acetaminophen] Other (See Comments)    dizziness    Social History:  reports that she has never smoked. She has never used smokeless tobacco. She reports that she does not drink alcohol or use illicit drugs. Patient is married, patient's husband is somewhat disabled secondary to significant DDD, but is able to perform most of her ADLs independently, does use a cane, has multiple children, grandchildren and 2 new great-grandchildren as of last Thursday-twins born in [place]  Family History: Family History  Problem Relation Age of Onset  . Heart disease Mother     Physical Exam: Filed Vitals:   09/02/13 1420 09/02/13 1804  BP: 120/58 118/65  Pulse: 64 68  Temp: 98.7 F (37.1 C)   TempSrc: Oral   Resp: 20 18  SpO2: 96% 98%   Very pleasant female in no apparent distress answering all questions appropriately with no short-term memory deficits, son at bedside listening, only commands Head exam normocephalic and atraumatic, sclera anicteric extraconal movements are intact pupils are equal and round and reactive to light Neck is supple with  no cervical lymphadenopathy Lungs clear to auscultation bilaterally with no respiratory distress and no axillary lymphadenopathy Cardiovascular reveals regular rate, no significant reversal Abdomen, status post multiple surgeries, colostomy clean dry and intact, nontender, bowel sounds present, somewhat protuberant  Extremities exam reveals no edema pedal pulses are intact venous insufficiency changes are present GU exam deferred and performed by urology earlier this afternoon Skin warm and dry and intact Neurologically grossly nonfocal alert and oriented x3, able to move all 4 extremities without hindrance following commands. No tremors   Labs on Admission:   Recent Labs  09/02/13 1510  NA 133*  K 4.1  CL 98  CO2 22  GLUCOSE 123*  BUN 13   CREATININE 0.91  CALCIUM 8.7   No results found for this basename: AST, ALT, ALKPHOS, BILITOT, PROT, ALBUMIN,  in the last 72 hours No results found for this basename: LIPASE, AMYLASE,  in the last 72 hours  Recent Labs  09/02/13 1522  WBC 8.1  HGB 11.9*  HCT 34.2*  MCV 94.0  PLT 210   No results found for this basename: CKTOTAL, CKMB, CKMBINDEX, TROPONINI,  in the last 72 hours No results found for this basename: TSH, T4TOTAL, FREET3, T3FREE, THYROIDAB,  in the last 72 hours No results found for this basename: VITAMINB12, FOLATE, FERRITIN, TIBC, IRON, RETICCTPCT,  in the last 72 hours  Radiological Exams on Admission: Recent CT scan abdomen and pelvis question urogram performed and reviewed by urology, results not in EPIC No results found. Orders placed in visit on 07/23/13  . EKG 12-LEAD   urinalysis with gross hematuria  Assessment/Plan Gross hematuria progressive with associated with large pelvic mass with bladder involvement and history of advanced ovarian cancer-management per urology and oncology, patient scheduled for surgery with tissue diagnosis cystoscopy middle of this coming week will need anticoagulation reversed, currently hemodynamically stable despite gross hematuria Atrial fibrillation/coronary artery disease-hemodynamically stable with no evidence of volume overload on physical exam, will check routine chest x-ray, EKG Pacemaker placement-hemodynamically stable monitored by cardiology Anticoagulation-PT/INR therapeutic for atrial fibrillation however will reverse with vitamin K given no hemodynamic instability however will have low threshold for giving FFP if reversal of PT/INR does not occur before Tuesday or Wednesday or bleeding complications worsen with instability Hypertension-stable on calcium channel blocker as well as beta blocker, will change to short-acting version with directions to hold if systolic blood pressure less than 130 given possibility of  hemodynamic compromise if worsening bleeding  DVT prophylaxis with SCDs DO NOT RESUSCITATE CODE STATUS discussed with the patient she does have a living will and clearly and verbally states that she does not wish to be resuscitated at the age of 73 given her comorbidities  Anirudh Baiz R 09/02/2013, 6:29 PM

## 2013-09-02 NOTE — Consult Note (Signed)
Reason for Consult: Progressive Hematuria, Large Pelvic Mass with Bladder Involvement, h/o Advanced Ovarian Cancer  Referring Physician: Lacretia Leigh MD  Victoria [name] is an 80 y.o. female.   HPI:   1 - Progressive Hematuria, Large Pelvic Mass with Bladder Involvement, h/o Advanced Ovarian Cancer - Pt seen at Urology office 07/2013 with new gross hematuria, CT Urogram with very large pelvic mass with bladder involvement at apparent site of known prior ovarian cancer. Mass with central necrosis and rapid progression (new since CT 07/2012) unusual for primary bladder cancer. Also new large vaginal bleeding s/p TAH/BSO. Presently with foley with large haematuria w/o clots. She is on coumadin for A-Fib, last dose yesterday, no prior CVA. Hgb today 11.9, Cr <1.5.  PMH sig for AFib, Pacer, Colostomy due to rectal involment of prior GYN cancer.   Today Victoria [name] is seen as urgent ER consult for above. She c/o progressive hematuria and now large vaginal bleeding x 1 day. Her medical oncologist is Sherril MD.    Past Medical History  Diagnosis Date  . Hyperlipidemia   . Rectal carcinoma   . Colon cancer 03/13/12 egd    rectal mass5cm  , 2-3cm proximal to anal verge  . Diverticulosis   . Allergy   . Anxiety     rhematoid  . Colostomy in place   . PONV (postoperative nausea and vomiting)   . Chronic anticoagulation   . Fluid collection (edema) in the arms, legs, hands and feet   . Urinary frequency     with burning x 2 days  . History of blood transfusion   . Hypertension   . Pacemaker   . Coronary artery disease     DR. CROITORU IS PT'S CARDIOLOGIST  . Atrial fibrillation   . Sinus node dysfunction 09/10/2008    Pacemaker [employer]  . RBBB   . PAT (paroxysmal atrial tachycardia)     Past Surgical History  Procedure Laterality Date  . Permanent pacemaker insertion  09/10/2008    [employer]  . Rib removed  1934    Pleurisy and pneumonia  . Partial hysterectomy  1972  . Bladder neck  suspension  1986  . Heart catherization  [id number]  . Abdominal hysterectomy      both ovaries intact,   . Colon resection  04/04/2012    Procedure: COLON RESECTION;  Surgeon: Rolm Bookbinder, MD;  Location: [facility];  Service: General;  Laterality: N/A;  laparotomy with small bowel resection for obstruction with colostomy with gastrostomy tube placement.   . Portacath placement  05/04/2012    Procedure: INSERTION PORT-A-CATH;  Surgeon: Rolm Bookbinder, MD;  Location: [facility];  Service: General;  Laterality: Right;  . Insert / replace / remove pacemaker  2/10  . Laparotomy Bilateral 10/03/2012    Procedure: EXPLORATORY LAPAROTOMY;  Surgeon: Imagene Gurney A. Alycia Rossetti, MD;  Location: [facility];  Service: Gynecology;  Laterality: Bilateral;  WITH BSO, TUMOR DEBULKING  . Colostomy revision N/A 10/03/2012    Procedure: COLOSTOMY REVISION;  Surgeon: Rolm Bookbinder, MD;  Location: [facility];  Service: General;  Laterality: N/A;  COLOSTOMY REVISION, Sigmoid colectomy  . US echocardiography  07/17/2008    EF =>55%,mild mitral annular ca+,mild-mod MR,TR,AOV mildly sclerotic  . Nm myoview ltd  07/17/2008    no ischemia  . Port-a-cath removal N/A 02/21/2013    Procedure: REMOVAL PORT-A-CATH;  Surgeon: Rolm Bookbinder, MD;  Location: [facility];  Service: General;  Laterality: N/A;    Family History  Problem Relation Age of Onset  .  Heart disease Mother     Social History:  reports that she has never smoked. She has never used smokeless tobacco. She reports that she does not drink alcohol or use illicit drugs.  Allergies:  Allergies  Allergen Reactions  . Pindolol Nausea Only and Swelling    Throat swelled up  . Iodine Other (See Comments)    BLISTERS  . Shellfish Allergy Diarrhea and Nausea And Vomiting  . Vibramycin [Doxycycline Calcium] Nausea Only and Other (See Comments)    dizziness  . Doxycycline Nausea Only  . Levofloxacin Other (See Comments)    DIZZINESS  . Multaq [Dronedarone] Other (See  Comments)    CAUSED SEVERE BURNING GI TRACT  . Vicodin [Hydrocodone-Acetaminophen] Other (See Comments)    dizziness    Medications: I have reviewed the patient's current medications.  Results for orders placed during the hospital encounter of 09/02/13 (from the past 48 hour(s))  BASIC METABOLIC PANEL     Status: Abnormal   Collection Time    09/02/13  3:10 PM      Result Value Range   Sodium 133 (*) 137 - 147 mEq/L   Potassium 4.1  3.7 - 5.3 mEq/L   Chloride 98  96 - 112 mEq/L   CO2 22  19 - 32 mEq/L   Glucose, Bld 123 (*) 70 - 99 mg/dL   BUN 13  6 - 23 mg/dL   Creatinine, Ser 0.91  0.50 - 1.10 mg/dL   Calcium 8.7  8.4 - 10.5 mg/dL   GFR calc non Af Amer 56 (*) >90 mL/min   GFR calc Af Amer 65 (*) >90 mL/min   Comment: (NOTE)     The eGFR has been calculated using the CKD EPI equation.     This calculation has not been validated in all clinical situations.     eGFR's persistently <90 mL/min signify possible Chronic Kidney     Disease.  CBC     Status: Abnormal   Collection Time    09/02/13  3:22 PM      Result Value Range   WBC 8.1  4.0 - 10.5 K/uL   RBC 3.64 (*) 3.87 - 5.11 MIL/uL   Hemoglobin 11.9 (*) 12.0 - 15.0 g/dL   HCT 34.2 (*) 36.0 - 46.0 %   MCV 94.0  78.0 - 100.0 fL   MCH 32.7  26.0 - 34.0 pg   MCHC 34.8  30.0 - 36.0 g/dL   RDW 13.7  11.5 - 15.5 %   Platelets 210  150 - 400 K/uL  PROTIME-INR     Status: Abnormal   Collection Time    09/02/13  3:23 PM      Result Value Range   Prothrombin Time 27.3 (*) 11.6 - 15.2 seconds   INR 2.64 (*) 0.00 - 1.49  URINALYSIS, ROUTINE W REFLEX MICROSCOPIC     Status: Abnormal   Collection Time    09/02/13  3:59 PM      Result Value Range   Color, Urine RED (*) YELLOW   Comment: BIOCHEMICALS MAY BE AFFECTED BY COLOR   APPearance TURBID (*) CLEAR   Specific Gravity, Urine 1.029  1.005 - 1.030   pH 7.0  5.0 - 8.0   Glucose, UA NEGATIVE  NEGATIVE mg/dL   Hgb urine dipstick LARGE (*) NEGATIVE   Bilirubin Urine NEGATIVE   NEGATIVE   Ketones, ur NEGATIVE  NEGATIVE mg/dL   Protein, ur >300 (*) NEGATIVE mg/dL   Urobilinogen, UA 0.2  0.0 - 1.0 mg/dL   Nitrite NEGATIVE  NEGATIVE   Leukocytes, UA SMALL (*) NEGATIVE  URINE MICROSCOPIC-ADD ON     Status: None   Collection Time    09/02/13  3:59 PM      Result Value Range   WBC, UA FIELD OBSCURED BY RBC'S  <3 WBC/hpf   RBC / HPF TOO NUMEROUS TO COUNT  <3 RBC/hpf   Urine-Other URINALYSIS PERFORMED ON SUPERNATANT     Comment: MICROSCOPIC EXAM PERFORMED ON UNCONCENTRATED URINE     FIELD OBSCURED BY RBC'S    No results found.  Review of Systems  Constitutional: Positive for malaise/fatigue. Negative for fever and chills.  HENT: Negative.   Eyes: Negative.   Respiratory: Negative.   Cardiovascular: Negative.   Gastrointestinal: Positive for abdominal pain.  Genitourinary: Positive for hematuria. Negative for flank pain.       New Large Vaginal Bleeding  Musculoskeletal: Negative.   Skin: Negative.   Neurological: Negative.   Endo/Heme/Allergies: Bruises/bleeds easily.  Psychiatric/Behavioral: Negative.    Blood pressure 120/58, pulse 64, temperature 98.7 F (37.1 C), temperature source Oral, resp. rate 20, SpO2 96.00%. Physical Exam  Constitutional: She is oriented to person, place, and time. She appears well-developed and well-nourished.  Son at bedside  HENT:  Head: Normocephalic and atraumatic.  Eyes: EOM are normal. Pupils are equal, round, and reactive to light.  Neck: Normal range of motion. Neck supple.  Cardiovascular: Normal rate.   Respiratory: Effort normal.  GI: Soft. Bowel sounds are normal. She exhibits mass.  Genitourinary:  Large firm Rt pelvic mass on bimanual exam, Large blood in vaginal vault and in foley catheter.  Musculoskeletal: Normal range of motion.  Neurological: She is alert and oriented to person, place, and time.  Skin: Skin is warm and dry.  Psychiatric: She has a normal mood and affect. Her behavior is normal.  Judgment and thought content normal.    Assessment/Plan:  1 - Progressive Hematuria, Large Pelvic Mass with Bladder Involvement, h/o Advanced Ovarian Cancer - Likely recurrence of prior GYN cancer now with vaginal cuff and bladder involvement and impressive bleeding. Needs tissue diagnosis, probably easiest to obtain cystoscopically. Rec hospitalist admission, stop (?reverse) coumadin, and plan for cysto with transurethral resection / biopsy of large bass after INR normalized. Could likely perform this Tuesday or Wednesday. Further management pending tissue diagnosis (GYN v. GU).  Also consider consult medical oncology (Dr. Ammie Dalton).     Victoria [name] 09/02/2013, 5:28 PM

## 2013-09-03 ENCOUNTER — Other Ambulatory Visit: Payer: Self-pay | Admitting: Urology

## 2013-09-03 ENCOUNTER — Inpatient Hospital Stay (HOSPITAL_COMMUNITY): Payer: Medicare Other

## 2013-09-03 LAB — CBC
HEMATOCRIT: 28.9 % — AB (ref 36.0–46.0)
Hemoglobin: 9.7 g/dL — ABNORMAL LOW (ref 12.0–15.0)
MCH: 31.6 pg (ref 26.0–34.0)
MCHC: 33.6 g/dL (ref 30.0–36.0)
MCV: 94.1 fL (ref 78.0–100.0)
Platelets: 176 10*3/uL (ref 150–400)
RBC: 3.07 MIL/uL — ABNORMAL LOW (ref 3.87–5.11)
RDW: 13.8 % (ref 11.5–15.5)
WBC: 7.8 10*3/uL (ref 4.0–10.5)

## 2013-09-03 LAB — COMPREHENSIVE METABOLIC PANEL
ALBUMIN: 2.9 g/dL — AB (ref 3.5–5.2)
ALT: 15 U/L (ref 0–35)
AST: 21 U/L (ref 0–37)
Alkaline Phosphatase: 83 U/L (ref 39–117)
BUN: 11 mg/dL (ref 6–23)
CALCIUM: 8.2 mg/dL — AB (ref 8.4–10.5)
CO2: 25 mEq/L (ref 19–32)
CREATININE: 0.78 mg/dL (ref 0.50–1.10)
Chloride: 100 mEq/L (ref 96–112)
GFR calc Af Amer: 87 mL/min — ABNORMAL LOW (ref >90)
GFR calc non Af Amer: 75 mL/min — ABNORMAL LOW (ref >90)
Glucose, Bld: 114 mg/dL — ABNORMAL HIGH (ref 70–99)
Potassium: 4 mEq/L (ref 3.7–5.3)
Sodium: 134 mEq/L — ABNORMAL LOW (ref 137–147)
Total Bilirubin: 0.4 mg/dL (ref 0.3–1.2)
Total Protein: 5.2 g/dL — ABNORMAL LOW (ref 6.0–8.3)

## 2013-09-03 LAB — URINE CULTURE
COLONY COUNT: NO GROWTH
CULTURE: NO GROWTH

## 2013-09-03 LAB — MRSA PCR SCREENING: MRSA by PCR: NEGATIVE

## 2013-09-03 LAB — PROTIME-INR
INR: 1.69 — AB (ref 0.00–1.49)
PROTHROMBIN TIME: 19.4 s — AB (ref 11.6–15.2)

## 2013-09-03 MED ORDER — HYOSCYAMINE SULFATE 0.125 MG SL SUBL
0.1250 mg | SUBLINGUAL_TABLET | SUBLINGUAL | Status: DC | PRN
  Filled 2013-09-03: qty 1

## 2013-09-03 MED ORDER — PHYTONADIONE 5 MG PO TABS
10.0000 mg | ORAL_TABLET | Freq: Once | ORAL | Status: AC
  Administered 2013-09-03: 10 mg via ORAL
  Filled 2013-09-03: qty 2

## 2013-09-03 MED ORDER — HYOSCYAMINE SULFATE 0.125 MG SL SUBL
0.1250 mg | SUBLINGUAL_TABLET | SUBLINGUAL | Status: AC
  Administered 2013-09-03 – 2013-09-04 (×3): 0.125 mg via SUBLINGUAL
  Filled 2013-09-03 (×3): qty 1

## 2013-09-03 MED ORDER — HYOSCYAMINE SULFATE 0.125 MG SL SUBL
0.1250 mg | SUBLINGUAL_TABLET | SUBLINGUAL | Status: DC | PRN
  Filled 2013-09-03 (×2): qty 1

## 2013-09-03 MED ORDER — HYOSCYAMINE SULFATE 0.125 MG SL SUBL: 0.1250 mg | SUBLINGUAL_TABLET | SUBLINGUAL | Status: DC | PRN

## 2013-09-03 MED ORDER — HYOSCYAMINE SULFATE 0.125 MG SL SUBL
0.1250 mg | SUBLINGUAL_TABLET | SUBLINGUAL | Status: DC
  Filled 2013-09-03 (×3): qty 1

## 2013-09-03 NOTE — Progress Notes (Signed)
Subjective: Patient is asymptomatic but c/o feeling urge to void w/ current indwelling foley catheter.  Cranberry red urine noted in catheter bag and also leaking around catheter onto her diaper.  Objective: Vital signs in last 24 hours: Temp:  [97.7 F (36.5 C)-98 F (36.7 C)] 97.8 F (36.6 C) (02/09 1200) Pulse Rate:  [52-73] 73 (02/09 1000) Resp:  [13-21] 21 (02/09 1000) BP: (86-136)/(36-74) 113/63 mmHg (02/09 1000) SpO2:  [93 %-99 %] 97 % (02/09 1000) Weight:  [61.2 kg (134 lb 14.7 oz)] 61.2 kg (134 lb 14.7 oz) (02/08 2043)  Intake/Output from previous day: 02/08 0701 - 02/09 0700 In: 753 [I.V.:753] Out: 600 [Urine:600] Intake/Output this shift: Total I/O In: 600 [I.V.:225; Other:375] Out: -   Physical Exam:  General:alert and oriented, comfortable Abdomen is slightly distended but non tender to touch Vagina and urethra area has dried blood from hematuria leaking around the foley catheter    Lab Results:  Recent Labs  09/02/13 1522 09/02/13 2150 09/03/13 0352  HGB 11.9* 10.3* 9.7*  HCT 34.2* 30.1* 28.9*   BMET  Recent Labs  09/02/13 1510 09/03/13 0352  NA 133* 134*  K 4.1 4.0  CL 98 100  CO2 22 25  GLUCOSE 123* 114*  BUN 13 11  CREATININE 0.91 0.78  CALCIUM 8.7 8.2*    Recent Labs  09/02/13 1523 09/03/13 0352  INR 2.64* 1.69*   No results found for this basename: LABURIN,  in the last 72 hours Results for orders placed during the hospital encounter of 09/02/13  MRSA PCR SCREENING     Status: None   Collection Time    09/02/13 11:09 PM      Result Value Range Status   MRSA by PCR NEGATIVE  NEGATIVE Final   Comment:            The GeneXpert MRSA Assay (FDA     approved for NASAL specimens     only), is one component of a     comprehensive MRSA colonization     surveillance program. It is not     intended to diagnose MRSA     infection nor to guide or     monitor treatment for     MRSA infections.    PROCEDURE:  Manual irrigation  was done today w/ total amount of 528ml of sterile water.  Multiple medium size 1cm clots were noted during irrigation.  No clots were noted at the end of the irrigation.  However, unable to clear urine which remains consistently cranberry red color but catheter is patent and no resistance noted during irrigation.  Bladder scan done after irrigation noted 160 ml of residue.  Patient tolerated the procedure well w/o discomfort.  Assessment/Plan: Hematuria - Unresolved hematuria after manual irrigation but able to remove moderate amount of clots during procedure.  Slightly elevated 160 ml residue may be residual clots unable to be cleaned out w/ manual irrigation.  Her Hgb is stable 9.7, vitals stable, afebrile.  Coumadin has been d/c since Friday, INR is acceptable 1.7.  Patient aware of CT result of pelvis mass, informed by Dr. Tresa Moore, urologist on call over the weekend.   -  Urine leakage around catheter is most likely caused by bladder spasm since catheter is draining well.  Plan: - To OR tomorrow for cysto TURBT, pending urine culture result.  Patient has to have neg urine culture prior to procedure.  Dr. Junious Silk will see patient today and discuss with her more in details  about the procedure.  She will need preop evaluation by medicine for overall stability for surgery.  NPO after midnight.  -  Catheter is draining w current 18 Fr foley catheter, will not need CBI. -  Levsin 0.125mg  SL every 4- 6 hours for bladder spasm ordered.    LOS: 1 day   Jonita Hirota N 09/03/2013, 2:36 PM

## 2013-09-03 NOTE — Progress Notes (Signed)
Utilization review completed.  

## 2013-09-03 NOTE — Progress Notes (Addendum)
Patient ID: Victoria [name], female   DOB: 01/24/1927, 80 y.o.   MRN: [id number]   Pt foley not draining. She is voiding around it. Urine dark red.   Bladder irrigation: Initially got a lot of small clots and drained about 200 ml urine from bladder. I irrigated urine until crystal clear. Urine like water. No active bleeding. Observed for several minutes with several in and out irrigation after clear and again no bleeding. Equal return.    I reviewed patient notes, labs and CT images.  No need for CBI at present although she could bleed again.  Discussed with patient again CT findings - nature, r/b/a of TURBT. Risk of bleeding, bladder perforation among others. Goal of procedure - reduce tumor size to prevent further bleeding and get path diagnosis.  Plan for OR Wednesday - I could not get time in OR tomorrow during day. I don't feel like she needs to be placed as an urgent add on given findings on irrigation tonight, only slight decrease in h/h today, but I wouldn't be comfortable sending her home with outpt elective f/u given size of mass and how much she bled.

## 2013-09-03 NOTE — Progress Notes (Signed)
Pt c/o urine leaking around her foley cath, on assessment no urine was noted in the foley bag but pt bed was soaked with bloody urine, attempt was made to irrigate the catheter but the irrigant came right out around the catheter.Bladder scan showed about 22ml of urine in her bladder. Dr Louis Meckel with [facility] was notified, verbal order was received to replace the foley. Foley cath replaced without incident,166ml of bloody urine returned, Pt tolerated procedure ok. Will continue to monitor.

## 2013-09-03 NOTE — Progress Notes (Signed)
Pt leaked a copious amount of urine around foley. Irrigated foley and many large blood clots came out. Will continue to monitor pt.  

## 2013-09-03 NOTE — Progress Notes (Signed)
Subjective: Not having any pain or shortness of breath, had flushing and dysphoria associated with administration of IV vitamin K last night.  Objective: Vital signs in last 24 hours: Temp:  [97.7 F (36.5 C)-98.7 F (37.1 C)] 97.7 F (36.5 C) (02/09 0400) Pulse Rate:  [52-70] 70 (02/09 0220) Resp:  [13-20] 16 (02/09 0220) BP: (86-136)/(41-74) 110/50 mmHg (02/09 0600) SpO2:  [93 %-99 %] 96 % (02/09 0220) Weight:  [61.2 kg (134 lb 14.7 oz)] 61.2 kg (134 lb 14.7 oz) (02/08 2043) Weight change:    Intake/Output from previous day: 02/08 0701 - 02/09 0700 In: 453 [I.V.:453] Out: 400 [Urine:400]   General appearance: alert, cooperative and no distress Resp: clear to auscultation bilaterally Cardio: regular rate and rhythm GI: soft, non-tender; bowel sounds normal; no masses,  no organomegaly Extremities: extremities normal, atraumatic, no cyanosis or edema  Lab Results:  Recent Labs  09/02/13 2150 09/03/13 0352  WBC 8.5 7.8  HGB 10.3* 9.7*  HCT 30.1* 28.9*  PLT 181 176   BMET  Recent Labs  09/02/13 1510 09/03/13 0352  NA 133* 134*  K 4.1 4.0  CL 98 100  CO2 22 25  GLUCOSE 123* 114*  BUN 13 11  CREATININE 0.91 0.78  CALCIUM 8.7 8.2*   CMET CMP     Component Value Date/Time   NA 134* 09/03/2013 0352   NA 135* 11/21/2012 0920   K 4.0 09/03/2013 0352   K 4.3 11/21/2012 0920   CL 100 09/03/2013 0352   CL 103 11/21/2012 0920   CO2 25 09/03/2013 0352   CO2 25 11/21/2012 0920   GLUCOSE 114* 09/03/2013 0352   GLUCOSE 101* 11/21/2012 0920   BUN 11 09/03/2013 0352   BUN 13.3 11/21/2012 0920   CREATININE 0.78 09/03/2013 0352   CREATININE 0.8 11/21/2012 0920   CALCIUM 8.2* 09/03/2013 0352   CALCIUM 8.7 11/21/2012 0920   PROT 5.2* 09/03/2013 0352   PROT 6.7 10/24/2012 0840   ALBUMIN 2.9* 09/03/2013 0352   ALBUMIN 3.3* 10/24/2012 0840   AST 21 09/03/2013 0352   AST 17 10/24/2012 0840   ALT 15 09/03/2013 0352   ALT 8 10/24/2012 0840   ALKPHOS 83 09/03/2013 0352   ALKPHOS 89 10/24/2012 0840   BILITOT 0.4 09/03/2013 0352   BILITOT 0.29 10/24/2012 0840   GFRNONAA 75* 09/03/2013 0352   GFRAA 87* 09/03/2013 0352    CBG (last 3)  No results found for this basename: GLUCAP,  in the last 72 hours  INR RESULTS:   Lab Results  Component Value Date   INR 1.69* 09/03/2013   INR 2.64* 09/02/2013   INR 2.5 08/28/2013   PROTIME 26.4* 12/05/2012   PROTIME 24.0* 11/21/2012   PROTIME 27.6* 10/31/2012     Studies/Results: No results found.  Medications: I have reviewed the patient's current medications.  Assessment/Plan: #1 Hematuria: continues to have gross hematuria with minimal change in hematocrit. Will administer vitamin K po today, recheck INR in the morning.  #2 Atrial fibrillation: stable on cardizem po, given gross hematuria and bladder invasion of mass risks of continued coumadin anticoagulation outweigh benefits. #3 Anemia: fairly stable and will continue to monitor CBC   LOS: 1 day   Emree Locicero G 09/03/2013, 7:25 AM

## 2013-09-03 NOTE — Progress Notes (Signed)
Pts foley catheter not draining very well; spoke with Dr. Junious Silk from Urology, who agrees to switch catheter to 18 french, and he will assess patient when rounding.

## 2013-09-04 LAB — BASIC METABOLIC PANEL
BUN: 7 mg/dL (ref 6–23)
CO2: 26 mEq/L (ref 19–32)
CREATININE: 0.85 mg/dL (ref 0.50–1.10)
Calcium: 8 mg/dL — ABNORMAL LOW (ref 8.4–10.5)
Chloride: 103 mEq/L (ref 96–112)
GFR calc non Af Amer: 61 mL/min — ABNORMAL LOW (ref >90)
GFR, EST AFRICAN AMERICAN: 71 mL/min — AB (ref >90)
GLUCOSE: 100 mg/dL — AB (ref 70–99)
POTASSIUM: 4.4 meq/L (ref 3.7–5.3)
SODIUM: 136 meq/L — AB (ref 137–147)

## 2013-09-04 LAB — HEMOGLOBIN AND HEMATOCRIT, BLOOD
HCT: 26 % — ABNORMAL LOW (ref 36.0–46.0)
Hemoglobin: 8.9 g/dL — ABNORMAL LOW (ref 12.0–15.0)

## 2013-09-04 LAB — PROTIME-INR
INR: 1.15 (ref 0.00–1.49)
Prothrombin Time: 14.5 seconds (ref 11.6–15.2)

## 2013-09-04 LAB — CBC
HEMATOCRIT: 25.7 % — AB (ref 36.0–46.0)
Hemoglobin: 8.8 g/dL — ABNORMAL LOW (ref 12.0–15.0)
MCH: 32.2 pg (ref 26.0–34.0)
MCHC: 34.2 g/dL (ref 30.0–36.0)
MCV: 94.1 fL (ref 78.0–100.0)
Platelets: 148 10*3/uL — ABNORMAL LOW (ref 150–400)
RBC: 2.73 MIL/uL — ABNORMAL LOW (ref 3.87–5.11)
RDW: 13.8 % (ref 11.5–15.5)
WBC: 5.4 10*3/uL (ref 4.0–10.5)

## 2013-09-04 LAB — PREPARE RBC (CROSSMATCH)

## 2013-09-04 MED ORDER — POLYSACCHARIDE IRON COMPLEX 150 MG PO CAPS
150.0000 mg | ORAL_CAPSULE | Freq: Every day | ORAL | Status: DC
  Administered 2013-09-04 – 2013-09-07 (×3): 150 mg via ORAL
  Filled 2013-09-04 (×4): qty 1

## 2013-09-04 NOTE — Progress Notes (Signed)
1 - Progressive Hematuria, Large Pelvic Mass with Bladder Involvement, h/o Advanced Ovarian Cancer - Called by nsg to evaluation possible clogged foley. Exam concurs. Using aseptic technique catheter repalced with 45F hematuria catheter, then hand-irrigated quantitiatively with 1L NS in 60cc aliquots. Clot removed, then connected to NS continuous irrigation to efflux pink.  T+C 2 units, check H/H. Non-tachycardic, dyspnic.  If hgb drops significantly would consider transfusion and possible mass v. Internal iliac embolization.   Proceed as planned for TUR resection / biopsy tomorrow.

## 2013-09-04 NOTE — Progress Notes (Signed)
Pt with bloody urine and blood clots.  Irrigated easily but urine does not return.  Pt feels full and urine leaking around foley. Dr Quenten Raven notified

## 2013-09-04 NOTE — Progress Notes (Signed)
Patient ID: Victoria [name], female   DOB: [birth year], 80 y.o.   MRN: [id number]  Pt without complaints. Some hematuria but reports foley draining well.   PE: NAD Foley - urine light red, no clots Abd soft, NT   Imp -  Pelvic/bladder mass - discussed with patient and son nature, r/b/a to TURBT. Discussed goal of TURBT to diagnose and hopefully slow some of the bleeding/hematuria. Discussed she may have episodes of hematuria on and off until definitive treatment. All questions answered.

## 2013-09-04 NOTE — Progress Notes (Signed)
Subjective: Feels ok with no pain or dyspnea, continues to have some blood in foley catheter  Objective: Vital signs in last 24 hours: Temp:  [97.8 F (36.6 C)-98.5 F (36.9 C)] 98 F (36.7 C) (02/10 0400) Pulse Rate:  [64-78] 71 (02/10 0700) Resp:  [15-25] 16 (02/10 0700) BP: (100-126)/(41-89) 126/62 mmHg (02/10 0600) SpO2:  [91 %-98 %] 96 % (02/10 0700) Weight:  [63.3 kg (139 lb 8.8 oz)] 63.3 kg (139 lb 8.8 oz) (02/10 0500) Weight change: 2.1 kg (4 lb 10.1 oz)   Intake/Output from previous day: 02/09 0701 - 02/10 0700 In: 1810 [I.V.:1810] Out: 3030 [Urine:3030]   General appearance: alert, cooperative and no distress Resp: clear to auscultation bilaterally Cardio: regular rate and rhythm GI: soft, non-tender; bowel sounds normal; no masses,  no organomegaly Extremities: extremities normal, atraumatic, no cyanosis or edema  Lab Results:  Recent Labs  09/03/13 0352 09/04/13 0330  WBC 7.8 5.4  HGB 9.7* 8.8*  HCT 28.9* 25.7*  PLT 176 148*   BMET  Recent Labs  09/03/13 0352 09/04/13 0330  NA 134* 136*  K 4.0 4.4  CL 100 103  CO2 25 26  GLUCOSE 114* 100*  BUN 11 7  CREATININE 0.78 0.85  CALCIUM 8.2* 8.0*   CMET CMP     Component Value Date/Time   NA 136* 09/04/2013 0330   NA 135* 11/21/2012 0920   K 4.4 09/04/2013 0330   K 4.3 11/21/2012 0920   CL 103 09/04/2013 0330   CL 103 11/21/2012 0920   CO2 26 09/04/2013 0330   CO2 25 11/21/2012 0920   GLUCOSE 100* 09/04/2013 0330   GLUCOSE 101* 11/21/2012 0920   BUN 7 09/04/2013 0330   BUN 13.3 11/21/2012 0920   CREATININE 0.85 09/04/2013 0330   CREATININE 0.8 11/21/2012 0920   CALCIUM 8.0* 09/04/2013 0330   CALCIUM 8.7 11/21/2012 0920   PROT 5.2* 09/03/2013 0352   PROT 6.7 10/24/2012 0840   ALBUMIN 2.9* 09/03/2013 0352   ALBUMIN 3.3* 10/24/2012 0840   AST 21 09/03/2013 0352   AST 17 10/24/2012 0840   ALT 15 09/03/2013 0352   ALT 8 10/24/2012 0840   ALKPHOS 83 09/03/2013 0352   ALKPHOS 89 10/24/2012 0840   BILITOT 0.4 09/03/2013  0352   BILITOT 0.29 10/24/2012 0840   GFRNONAA 61* 09/04/2013 0330   GFRAA 71* 09/04/2013 0330    CBG (last 3)  No results found for this basename: GLUCAP,  in the last 72 hours  INR RESULTS:   Lab Results  Component Value Date   INR 1.15 09/04/2013   INR 1.69* 09/03/2013   INR 2.64* 09/02/2013   PROTIME 26.4* 12/05/2012   PROTIME 24.0* 11/21/2012   PROTIME 27.6* 10/31/2012     Studies/Results: X-ray Chest Pa And Lateral   09/03/2013   CLINICAL DATA:  Preop for pelvic mass.  EXAM: CHEST  2 VIEW  COMPARISON:  09/28/2012.  FINDINGS: The power port is been removed. The heart is in large but stable. There is tortuosity and calcification of the thoracic aorta. Chronic bronchitic and emphysematous lung changes but no definite acute pulmonary findings. No pleural effusion. Pacer wires are stable.  IMPRESSION: Stable mild cardiac enlargement and chronic lung changes but no acute pulmonary findings.   Electronically Signed   By: Kalman Jewels M.D.   On: 09/03/2013 07:29    Medications: I have reviewed the patient's current medications.  Assessment/Plan: #1 Atrial Fibrillation: stable on current meds with coumadin stopped due to  gross hematuria. INR is now in normal range. #2 Anemia: interval decrease in hemoglobin from hematuria. Bleeding is stabilizing, and await biopsy tomorrow with definitive surgery for pelvic mass pending results of biopsy. Needs continued inpatient monitoring due to size of mass and potential for severe hemorrhage. #3 Protein Calorie Malnutrition: mild and will add nutrition supplements.   LOS: 2 days   Victoria [name] 09/04/2013, 8:18 AM

## 2013-09-04 NOTE — Progress Notes (Signed)
Pt leaked a copious amount of urine around foley. Irrigated foley and many large blood clots came out. Will continue to monitor pt.

## 2013-09-05 ENCOUNTER — Encounter (HOSPITAL_COMMUNITY): Payer: Self-pay | Admitting: Anesthesiology

## 2013-09-05 ENCOUNTER — Encounter: Payer: Self-pay | Admitting: Radiation Oncology

## 2013-09-05 ENCOUNTER — Inpatient Hospital Stay (HOSPITAL_COMMUNITY): Payer: Medicare Other | Admitting: Anesthesiology

## 2013-09-05 ENCOUNTER — Ambulatory Visit
Admit: 2013-09-05 | Discharge: 2013-09-05 | Disposition: A | Discharge status: Home / Self Care | Payer: Medicare Other | Attending: Radiation Oncology | Admitting: Radiation Oncology

## 2013-09-05 ENCOUNTER — Encounter (HOSPITAL_COMMUNITY): Payer: Medicare Other | Admitting: Anesthesiology

## 2013-09-05 ENCOUNTER — Encounter (HOSPITAL_COMMUNITY)
Admission: EM | Disposition: A | Discharge status: Home / Self Care | Payer: Self-pay | Source: Home / Self Care | Attending: Internal Medicine

## 2013-09-05 DIAGNOSIS — C569 Malignant neoplasm of unspecified ovary: Secondary | ICD-10-CM

## 2013-09-05 HISTORY — PX: TRANSURETHRAL RESECTION OF BLADDER TUMOR WITH GYRUS (TURBT-GYRUS): SHX6458

## 2013-09-05 LAB — CBC
HCT: 21.3 % — ABNORMAL LOW (ref 36.0–46.0)
HEMOGLOBIN: 7.3 g/dL — AB (ref 12.0–15.0)
MCH: 32.7 pg (ref 26.0–34.0)
MCHC: 34.3 g/dL (ref 30.0–36.0)
MCV: 95.5 fL (ref 78.0–100.0)
Platelets: 143 10*3/uL — ABNORMAL LOW (ref 150–400)
RBC: 2.23 MIL/uL — ABNORMAL LOW (ref 3.87–5.11)
RDW: 13.9 % (ref 11.5–15.5)
WBC: 8 10*3/uL (ref 4.0–10.5)

## 2013-09-05 LAB — PREPARE RBC (CROSSMATCH)

## 2013-09-05 SURGERY — TRANSURETHRAL RESECTION OF BLADDER TUMOR WITH GYRUS (TURBT-GYRUS)
Anesthesia: General | Site: Bladder

## 2013-09-05 MED ORDER — SODIUM CHLORIDE 0.9 % IR SOLN
Status: DC | PRN
  Administered 2013-09-05: 39000 mL via INTRAVESICAL

## 2013-09-05 MED ORDER — LIDOCAINE HCL 2 % EX GEL
CUTANEOUS | Status: AC
  Filled 2013-09-05: qty 10

## 2013-09-05 MED ORDER — FENTANYL CITRATE 0.05 MG/ML IJ SOLN: 25.0000 ug | INTRAMUSCULAR | Status: DC | PRN

## 2013-09-05 MED ORDER — CEFAZOLIN SODIUM-DEXTROSE 2-3 GM-% IV SOLR
INTRAVENOUS | Status: AC
  Filled 2013-09-05: qty 50

## 2013-09-05 MED ORDER — CEFAZOLIN SODIUM-DEXTROSE 2-3 GM-% IV SOLR
INTRAVENOUS | Status: DC | PRN
  Administered 2013-09-05: 2 g via INTRAVENOUS

## 2013-09-05 MED ORDER — LIDOCAINE HCL (CARDIAC) 20 MG/ML IV SOLN
INTRAVENOUS | Status: DC | PRN
  Administered 2013-09-05: 40 mg via INTRAVENOUS

## 2013-09-05 MED ORDER — BELLADONNA ALKALOIDS-OPIUM 16.2-60 MG RE SUPP
RECTAL | Status: AC
  Filled 2013-09-05: qty 1

## 2013-09-05 MED ORDER — SODIUM CHLORIDE 0.9 % IJ SOLN
INTRAMUSCULAR | Status: AC
Start: 2013-09-05 — End: 2013-09-06
  Filled 2013-09-05: qty 3

## 2013-09-05 MED ORDER — PROPOFOL 10 MG/ML IV BOLUS
INTRAVENOUS | Status: DC | PRN
  Administered 2013-09-05: 100 mg via INTRAVENOUS
  Administered 2013-09-05 (×2): 30 mg via INTRAVENOUS

## 2013-09-05 MED ORDER — CEFAZOLIN SODIUM-DEXTROSE 2-3 GM-% IV SOLR
2.0000 g | INTRAVENOUS | Status: DC
Start: 2013-09-05 — End: 2013-09-05

## 2013-09-05 MED ORDER — ONDANSETRON HCL 4 MG/2ML IJ SOLN
INTRAMUSCULAR | Status: DC | PRN
  Administered 2013-09-05: 4 mg via INTRAVENOUS

## 2013-09-05 MED ORDER — LIDOCAINE HCL (CARDIAC) 20 MG/ML IV SOLN
INTRAVENOUS | Status: AC
  Filled 2013-09-05: qty 5

## 2013-09-05 MED ORDER — LACTATED RINGERS IV SOLN
INTRAVENOUS | Status: DC | PRN
  Administered 2013-09-05 (×2): via INTRAVENOUS

## 2013-09-05 MED ORDER — MEPERIDINE HCL 50 MG/ML IJ SOLN: 6.2500 mg | INTRAMUSCULAR | Status: DC | PRN

## 2013-09-05 MED ORDER — PROPOFOL 10 MG/ML IV BOLUS
INTRAVENOUS | Status: AC
  Filled 2013-09-05: qty 20

## 2013-09-05 MED ORDER — BELLADONNA ALKALOIDS-OPIUM 16.2-60 MG RE SUPP
RECTAL | Status: DC | PRN
  Administered 2013-09-05: 1 via RECTAL

## 2013-09-05 MED ORDER — FENTANYL CITRATE 0.05 MG/ML IJ SOLN
INTRAMUSCULAR | Status: AC
  Filled 2013-09-05: qty 2

## 2013-09-05 MED ORDER — ONDANSETRON HCL 4 MG/2ML IJ SOLN
INTRAMUSCULAR | Status: AC
  Filled 2013-09-05: qty 2

## 2013-09-05 MED ORDER — FENTANYL CITRATE 0.05 MG/ML IJ SOLN
INTRAMUSCULAR | Status: DC | PRN
  Administered 2013-09-05 (×4): 25 ug via INTRAVENOUS

## 2013-09-05 SURGICAL SUPPLY — 19 items
BAG URINE DRAINAGE (UROLOGICAL SUPPLIES) ×3 IMPLANT
BAG URO CATCHER STRL LF (DRAPE) ×3 IMPLANT
CATH FOLEY 3WAY 30CC 22FR (CATHETERS) ×1 IMPLANT
DRAPE CAMERA CLOSED 9X96 (DRAPES) ×3 IMPLANT
ELECT BUTTON HF 24-28F 2 30DE (ELECTRODE) ×3 IMPLANT
ELECT LOOP MED HF 24F 12D (CUTTING LOOP) IMPLANT
ELECT LOOP MED HF 24F 12D CBL (CLIP) ×5 IMPLANT
ELECT RESECT VAPORIZE 12D CBL (ELECTRODE) IMPLANT
EVACUATOR MICROVAS BLADDER (UROLOGICAL SUPPLIES) ×1 IMPLANT
GLOVE BIOGEL M STRL SZ7.5 (GLOVE) ×3 IMPLANT
GOWN STRL REUS W/TWL XL LVL3 (GOWN DISPOSABLE) ×5 IMPLANT
HOLDER FOLEY CATH W/STRAP (MISCELLANEOUS) IMPLANT
IV NS IRRIG 3000ML ARTHROMATIC (IV SOLUTION) ×12 IMPLANT
MANIFOLD NEPTUNE II (INSTRUMENTS) ×3 IMPLANT
PACK CYSTO (CUSTOM PROCEDURE TRAY) ×3 IMPLANT
SYR 30ML LL (SYRINGE) IMPLANT
SYRINGE IRR TOOMEY STRL 70CC (SYRINGE) ×2 IMPLANT
TUBING CONNECTING 10 (TUBING) ×2 IMPLANT
TUBING CONNECTING 10' (TUBING) ×1

## 2013-09-05 NOTE — Progress Notes (Signed)
I spoke with Dr. Kathlene Cote and reviewed patient's films with him. He thought there may be the possibility of embolizing this mass should the patient continue to have gross hematuria requiring transfusion.   I also called [facility] to determine if radiation or embolization might be helpful in acutely managing an aggressive pelvic mass. They will try to get a message to Dr. Skeet Latch or Dr. Alycia Rossetti to review the CT but neither are here today.

## 2013-09-05 NOTE — Consult Note (Signed)
Reason for Consult: Possible embolization of pelvic tumor Referring Physician: Janeshia Ciliberto is an 80 y.o. female.  HPI: History of ovarian carcinoma and s/p surgery by Dr. Alycia Rossetti and chemo per Dr. Benay Spice.  Now with right pelvic recurrence and 8 cm mass eroding into bladder, causing gross hematuria.  Patient has had 2 U PRBC transfusion this admission.  Cystoscopy today by Dr. Junious Silk with friable, bleeding tumor noted.  After removal of Foley, has had only pink colored urine since cysto w/o clots or bright red blood.  No pelvic pain.  Was on chronic Coumadin on admission for history of a fib.  Past Medical History  Diagnosis Date  . Hyperlipidemia   . Rectal carcinoma   . Colon cancer 03/13/12 egd    rectal mass5cm  , 2-3cm proximal to anal verge  . Diverticulosis   . Allergy   . Anxiety     rhematoid  . Colostomy in place   . PONV (postoperative nausea and vomiting)   . Chronic anticoagulation   . Fluid collection (edema) in the arms, legs, hands and feet   . Urinary frequency     with burning x 2 days  . History of blood transfusion   . Hypertension   . Pacemaker   . Coronary artery disease     DR. CROITORU IS PT'S CARDIOLOGIST  . Atrial fibrillation   . Sinus node dysfunction 09/10/2008    Pacemaker [employer]  . RBBB   . PAT (paroxysmal atrial tachycardia)     Past Surgical History  Procedure Laterality Date  . Permanent pacemaker insertion  09/10/2008    [employer]  . Rib removed  1934    Pleurisy and pneumonia  . Partial hysterectomy  1972  . Bladder neck suspension  1986  . Heart catherization  [id number]  . Abdominal hysterectomy      both ovaries intact,   . Colon resection  04/04/2012    Procedure: COLON RESECTION;  Surgeon: Rolm Bookbinder, MD;  Location: [facility];  Service: General;  Laterality: N/A;  laparotomy with small bowel resection for obstruction with colostomy with gastrostomy tube placement.   . Portacath placement  05/04/2012    Procedure:  INSERTION PORT-A-CATH;  Surgeon: Rolm Bookbinder, MD;  Location: [facility];  Service: General;  Laterality: Right;  . Insert / replace / remove pacemaker  2/10  . Laparotomy Bilateral 10/03/2012    Procedure: EXPLORATORY LAPAROTOMY;  Surgeon: Imagene Gurney A. Alycia Rossetti, MD;  Location: [facility];  Service: Gynecology;  Laterality: Bilateral;  WITH BSO, TUMOR DEBULKING  . Colostomy revision N/A 10/03/2012    Procedure: COLOSTOMY REVISION;  Surgeon: Rolm Bookbinder, MD;  Location: [facility];  Service: General;  Laterality: N/A;  COLOSTOMY REVISION, Sigmoid colectomy  . US echocardiography  07/17/2008    EF =>55%,mild mitral annular ca+,mild-mod MR,TR,AOV mildly sclerotic  . Nm myoview ltd  07/17/2008    no ischemia  . Port-a-cath removal N/A 02/21/2013    Procedure: REMOVAL PORT-A-CATH;  Surgeon: Rolm Bookbinder, MD;  Location: [facility];  Service: General;  Laterality: N/A;    Family History  Problem Relation Age of Onset  . Heart disease Mother     Social History:  reports that she has never smoked. She has never used smokeless tobacco. She reports that she does not drink alcohol or use illicit drugs.  Allergies:  Allergies  Allergen Reactions  . Pindolol Nausea Only and Swelling    Throat swelled up  . Iodine Other (See Comments)  BLISTERS  . Shellfish Allergy Diarrhea and Nausea And Vomiting  . Vibramycin [Doxycycline Calcium] Nausea Only and Other (See Comments)    dizziness  . Doxycycline Nausea Only  . Levofloxacin Other (See Comments)    DIZZINESS  . Multaq [Dronedarone] Other (See Comments)    CAUSED SEVERE BURNING GI TRACT  . Vicodin [Hydrocodone-Acetaminophen] Other (See Comments)    dizziness    Medications: I have reviewed the patient's current medications.  Results for orders placed during the hospital encounter of 09/02/13 (from the past 48 hour(s))  CBC     Status: Abnormal   Collection Time    09/04/13  3:30 AM      Result Value Ref Range   WBC 5.4  4.0 - 10.5 K/uL   RBC  2.73 (*) 3.87 - 5.11 MIL/uL   Hemoglobin 8.8 (*) 12.0 - 15.0 g/dL   HCT 25.7 (*) 36.0 - 46.0 %   MCV 94.1  78.0 - 100.0 fL   MCH 32.2  26.0 - 34.0 pg   MCHC 34.2  30.0 - 36.0 g/dL   RDW 13.8  11.5 - 15.5 %   Platelets 148 (*) 150 - 400 K/uL  PROTIME-INR     Status: None   Collection Time    09/04/13  3:30 AM      Result Value Ref Range   Prothrombin Time 14.5  11.6 - 15.2 seconds   INR 1.15  0.00 - 7.82  BASIC METABOLIC PANEL     Status: Abnormal   Collection Time    09/04/13  3:30 AM      Result Value Ref Range   Sodium 136 (*) 137 - 147 mEq/L   Potassium 4.4  3.7 - 5.3 mEq/L   Chloride 103  96 - 112 mEq/L   CO2 26  19 - 32 mEq/L   Glucose, Bld 100 (*) 70 - 99 mg/dL   BUN 7  6 - 23 mg/dL   Creatinine, Ser 0.85  0.50 - 1.10 mg/dL   Calcium 8.0 (*) 8.4 - 10.5 mg/dL   GFR calc non Af Amer 61 (*) >90 mL/min   GFR calc Af Amer 71 (*) >90 mL/min   Comment: (NOTE)     The eGFR has been calculated using the CKD EPI equation.     This calculation has not been validated in all clinical situations.     eGFR's persistently <90 mL/min signify possible Chronic Kidney     Disease.  PREPARE RBC (CROSSMATCH)     Status: None   Collection Time    09/04/13  6:00 PM      Result Value Ref Range   Order Confirmation ORDER PROCESSED BY BLOOD BANK    HEMOGLOBIN AND HEMATOCRIT, BLOOD     Status: Abnormal   Collection Time    09/04/13  6:45 PM      Result Value Ref Range   Hemoglobin 8.9 (*) 12.0 - 15.0 g/dL   HCT 26.0 (*) 36.0 - 46.0 %  TYPE AND SCREEN     Status: None   Collection Time    09/04/13  6:45 PM      Result Value Ref Range   ABO/RH(D) O NEG     Antibody Screen POS     Sample Expiration 09/07/2013     Antibody Identification ANTI-D     DAT, IgG NEG     Unit Number [id number]     Blood Component Type RED CELLS,LR     Unit division 00  Status of Unit ISSUED     Transfusion Status OK TO TRANSFUSE     Crossmatch Result COMPATIBLE     Unit Number [id number]      Blood Component Type RED CELLS,LR     Unit division 00     Status of Unit ISSUED     Transfusion Status OK TO TRANSFUSE     Crossmatch Result COMPATIBLE     Unit Number [id number]     Blood Component Type RED CELLS,LR     Unit division 00     Status of Unit ALLOCATED     Transfusion Status OK TO TRANSFUSE     Crossmatch Result COMPATIBLE     Unit Number [id number]     Blood Component Type RBC LR PHER2     Unit division 00     Status of Unit ALLOCATED     Transfusion Status OK TO TRANSFUSE     Crossmatch Result COMPATIBLE     Unit Number [id number]     Blood Component Type RED CELLS,LR     Unit division 00     Status of Unit ALLOCATED     Transfusion Status OK TO TRANSFUSE     Crossmatch Result COMPATIBLE     Unit Number [id number]     Blood Component Type RED CELLS,LR     Unit division 00     Status of Unit ALLOCATED     Transfusion Status OK TO TRANSFUSE     Crossmatch Result COMPATIBLE    CBC     Status: Abnormal   Collection Time    09/05/13  3:28 AM      Result Value Ref Range   WBC 8.0  4.0 - 10.5 K/uL   RBC 2.23 (*) 3.87 - 5.11 MIL/uL   Hemoglobin 7.3 (*) 12.0 - 15.0 g/dL   Comment: DELTA CHECK NOTED     REPEATED TO VERIFY   HCT 21.3 (*) 36.0 - 46.0 %   MCV 95.5  78.0 - 100.0 fL   MCH 32.7  26.0 - 34.0 pg   MCHC 34.3  30.0 - 36.0 g/dL   RDW 13.9  11.5 - 15.5 %   Platelets 143 (*) 150 - 400 K/uL  PREPARE RBC (CROSSMATCH)     Status: None   Collection Time    09/05/13  8:00 AM      Result Value Ref Range   Order Confirmation ORDER PROCESSED BY BLOOD BANK      No results found.  Review of Systems  Constitutional: Negative for fever and chills.  Respiratory: Negative for cough and shortness of breath.   Cardiovascular: Negative for chest pain and palpitations.  Gastrointestinal: Negative for nausea, vomiting, abdominal pain and blood in stool.  Genitourinary: Positive for hematuria. Negative for dysuria, urgency and flank pain.   Blood  pressure 118/63, pulse 74, temperature 98.4 F (36.9 C), temperature source Oral, resp. rate 13, height $RemoveBe'5\' 1"'[id number]$  (1.549 m), weight 139 lb 8.8 oz (63.3 kg), SpO2 100.00%. Physical Exam  Nursing note and vitals reviewed.   Assessment/Plan: CT on 2/6 at [facility] reviewed.  Large, necrotic right pelvic mass invades dome of bladder.  Periphery of mass shows avid enhancement and there are prominent pelvic arteries supplying tumor.  Details of potential arteriography and embolization discussed with patient, daughter and granddaughter.  For now, bleeding appears to have slowed.  INR is now normal.  Will hold on embolization unless there is significant recurrent bleeding.  There is a risk of  bladder ischemia with tumor embolization.  Await recommendation from [place] and Oncology regarding other therapeutic options.  We will continue to follow.  Made NPO after MN in event procedure has to be performed tomorrow.  Sharlot Sturkey T 09/05/2013, 6:10 PM

## 2013-09-05 NOTE — Preoperative (Addendum)
Beta Blockers   Reason not to administer Beta Blockers:Not ApplicablePatient is on metoproplol, last taken 09-02-13.  Will monitor and give beta blocker as necessary.

## 2013-09-05 NOTE — Anesthesia Postprocedure Evaluation (Signed)
  Anesthesia Post-op Note  Patient: Victoria [name]  Procedure(s) Performed: Procedure(s) (LRB): TRANSURETHRAL RESECTION OF BLADDER TUMOR WITH GYRUS (TURBT-GYRUS) (N/A)  Patient Location: PACU  Anesthesia Type: General  Level of Consciousness: awake and alert   Airway and Oxygen Therapy: Patient Spontanous Breathing  Post-op Pain: mild  Post-op Assessment: Post-op Vital signs reviewed, Patient's Cardiovascular Status Stable, Respiratory Function Stable, Patent Airway and No signs of Nausea or vomiting  Last Vitals:  Filed Vitals:   09/05/13 1442  BP: 118/63  Pulse: 74  Temp: 36.7 C  Resp: 13    Post-op Vital Signs: stable   Complications: No apparent anesthesia complications

## 2013-09-05 NOTE — Op Note (Signed)
Preoperative diagnosis: Gross hematuria, pelvic mass, bladder mass Postoperative diagnosis: Same  Procedure: Exam under anesthesia, cystoscopy, TURBT and fulguration greater than 5 cm  Surgeon: Junious Silk  Type of anesthesia: Gen.  Findings: On exam under anesthesia, bimanual exam there is a hard but mobile mass present in the right pelvis and right anterior vaginal wall. The vaginal wall and cuff are palpably normal. There was no obvious rectal mass and a B&O suppository was placed.  On cystoscopy there was a large hypervascular tumor that emanated from most of the right posterior lateral wall going from inferior up toward the dome. The trigone and ureteral orifices were normal with clear efflux. The tumor was very vascular and friable consistent with a high-grade tumor. Only a small amount of resection could be accomplished before visibility was lost due to bleeding. Limited resection was possible with the resectoscope. The tumor bled of the scope touched it therefore I felt like a stiff hematuria catheter and balloon would certainly cause this friable tumor to bleed. There was excellent hemostasis at the end of the case therefore I thought it best to give her a chance without a catheter. If she bleeds again she may need to be restarted on CBI after some irrigation and we may need to consider embolization and/or radiation if this might shrink the tumor.  Description of procedure: After consent was obtained patient brought to the operating room. After adequate anesthesia she was placed in lithotomy position and prepped and draped in the usual sterile fashion. An exam under anesthesia was performed.  The resectoscope was passed per urethra and several pictures taken of this large tumor. I began to resect inferiorly but after just a few shallow swipes with the loop the bleeding became so severe visibility was almost lost. I then placed the button and was able to get good hemostasis. Therefore I  alternated between loop for resection and button for fulguration to get pathology. I also attempted to use the button to vaporize the tumor but it did not vaporize well. It seemed to get a layer of coagulation that limited vaporization. Therefore I was able to coagulate most of the inferior and anterior of the tumor. When the bladder was drained to irrigate the chips, if the scope touched the tumor it would bleed in this location. It required fulguration anywhere the scope touched the tumor.  I drained some of the chips that had floated toward the dome and the bubbles from the dome and the tumor bled where ever the scope touched it. This area was fulgurated. There was excellent hemostasis but again anytime the scope touched the tumor there was bleeding. Therefore thought to give her a trial without catheter. This took about 75 minutes. With good hemostasis, some path for diagnosis and seeing the futility in resecting this large mass with jut the resectoscope the procedure was terminated.   Complications: None  Blood loss: CBI was running during the case. Estimated 50 cc.  Drains: None  Specimens: Bladder tumor chips  Disposition: Patient stable to PACU

## 2013-09-05 NOTE — Progress Notes (Addendum)
Patient ID: Victoria [name], female   DOB: Mar 06, 1927, 80 y.o.   MRN: [id number]  Pt without complaints in pre-op area. First PRBC in. I note Dr. Benay Spice hasn't seen patient. I paged him.  I discussed with patient the mass appears to compress bladder and I might not be able to make a lot of progress in terms of decreasing the bleeding and diagnosis, but I hope so. It's worth an EUA and attempt at TURBT. Discussed again nature, R/B/A.   She elects to proceed.    Add: I discussed patient with Dr. Benay Spice. He agreed on endoscopic look today and will see pt in AM. Add: RN Neetu Carrozza is logged in [facility] in [place], I entered addendum under her log in. Georgette Dover, MD

## 2013-09-05 NOTE — Transfer of Care (Signed)
Immediate Anesthesia Transfer of Care Note  Patient: Victoria [name]  Procedure(s) Performed: Procedure(s): TRANSURETHRAL RESECTION OF BLADDER TUMOR WITH GYRUS (TURBT-GYRUS) (N/A)  Patient Location: PACU  Anesthesia Type:General  Level of Consciousness: awake, alert  and oriented  Airway & Oxygen Therapy: Patient Spontanous Breathing and Patient connected to face mask oxygen  Post-op Assessment: Report given to PACU RN and Post -op Vital signs reviewed and stable  Post vital signs: Reviewed and stable  Complications: No apparent anesthesia complications

## 2013-09-05 NOTE — Progress Notes (Signed)
Patient ID: Victoria [name], female   DOB: 1927/02/27, 80 y.o.   MRN: [id number]  Pt feeling great. She's walked the unit twice. She's voided three times and urine is "pink".  She's sitting in a chair and in NAD.   I spoke with Dr. Alycia Rossetti who thought radiation might be helpful to control hematuria/local symptoms. She spoke with Dr. Sondra Come. She also agreed embolization would be an option if hemorrhage/gross hematuria was severe, but fortunately pt is stable for now.

## 2013-09-05 NOTE — Clinical Documentation Improvement (Signed)
THIS DOCUMENT IS NOT A PERMANENT PART OF THE MEDICAL RECORD  Please update your documentation with the medical record to reflect your response to this query. If you need help knowing how to do this please call [phone].  09/05/13  Dear Dr[facility]  In an effort to better capture your patient's severity of illness, reflect appropriate length of stay and utilization of resources, a review of the patient medical record has revealed the following indicators.    Based on your clinical judgment, please clarify and document in a progress note and/or discharge summary the clinical condition associated with the following supporting information:  In responding to this query please exercise your independent judgment.  The fact that a query is asked, does not imply that any particular answer is desired or expected.  Noted 09/05/13 Progr Note.Marland KitchenMarland Kitchen"#3 Anemia: interval significant drop in Hct with transfusion of PRBCs ordered. Labs:09/04/13   0330     09/04/13    1845       09/05/13    0328   HGB           8.8*                  8.9*    7.3* HCT        25.7*     26.0*   21.3*  For accurate Dx specificity & severity can noted "Anemia" be further specified w/ cond being eval'd, mon'd & tx'd. Thank you    Possible Clinical Conditions?  Expected Acute Blood Loss Anemia Acute Blood Loss Anemia Acute on chronic blood loss anemia Chronic blood loss anemia Precipitous drop in Hematocrit Other Condition Cannot Clinically Determine  Supporting Information: Risk Factors: See above note Signs and Symptoms: See above note Diagnostics: See above note Treatments: See above note  Reviewed: additional documentation in the MEDICAL RECORD NUMBER2/17/15: notific noted> proc after cdi rev'd/ path noted, add'l info per dc summ> agree/cls'd. orm  Thank You,  Ermelinda Das, RN, BSN, CCDS Certified Clinical Documentation Specialist Pager: [phone]

## 2013-09-05 NOTE — Progress Notes (Signed)
Radiation Oncology         [phone]) [phone] ________________________________  Initial inpatient Consultation  Name: AMBREA HEGLER MRN: [id number]  Date: 09/05/2013  DOB: 04/24/1927  VH:QIONGEXB,MWUXLK G, MD  Sherol Dade., MD   REFERRING PHYSICIAN: Sherol Dade., MD  DIAGNOSIS: Probable recurrent ovarian carcinoma  HISTORY OF PRESENT ILLNESS::Victoria [name] is a 80 y.o. female who is seen out courtesy of Dr. Nancy Marus for an opinion concerning radiation therapy as part of management of patient's likely recurrent ovarian cancer with significant bladder involvement.   patient originally presented in August of 2013 with a rectal mass ultimately found to be ovarian cancer. Patient proceeded to undergo bypass procedure with descending loop colostomy and gastrostomy tube placement in September 2013. The patient was treated with Taxol carboplatinum with normalization of CA 125. Patient proceeded with a fourth cycle of chemotherapy and then underwent omentectomy and bilateral oophorectomy in March of 2014. There is no gross residual disease following surgery. Patient completed her sixth cycle of Taxol carboplatinum and April 2014.  Patient did well until recently when she presented with significant hematuria requiring hospitalization. A CT scan showed a large right pelvic recurrence with the 8 cm mass eroding into the bladder.  The patient did require transfusion of 2 units of packed red blood cells. Earlier today the patient was taken to the operating room by Dr. Junious Silk and on cystoscopy the patient was noted to have a friable tumor bleeding within the bladder region. Patient was able to have her Foley catheter removed earlier today and thus far is had no more significant hematuria. Radiation therapy is been consulted for consideration for palliative treatment.   PREVIOUS RADIATION THERAPY: No  PAST MEDICAL HISTORY:  has a past medical history of Hyperlipidemia; Rectal carcinoma; Colon cancer  (03/13/12 egd); Diverticulosis; Allergy; Anxiety; Colostomy in place; PONV (postoperative nausea and vomiting); Chronic anticoagulation; Fluid collection (edema) in the arms, legs, hands and feet; Urinary frequency; History of blood transfusion; Hypertension; Pacemaker; Coronary artery disease; Atrial fibrillation; Sinus node dysfunction (09/10/2008); RBBB; and PAT (paroxysmal atrial tachycardia).    PAST SURGICAL HISTORY: Past Surgical History  Procedure Laterality Date  . Permanent pacemaker insertion  09/10/2008    [facility]  . Rib removed  1934    Pleurisy and pneumonia  . Partial hysterectomy  1972  . Bladder neck suspension  1986  . Heart catherization  [id number]  . Abdominal hysterectomy      both ovaries intact,   . Colon resection  04/04/2012    Procedure: COLON RESECTION;  Surgeon: Rolm Bookbinder, MD;  Location: [facility];  Service: General;  Laterality: N/A;  laparotomy with small bowel resection for obstruction with colostomy with gastrostomy tube placement.   . Portacath placement  05/04/2012    Procedure: INSERTION PORT-A-CATH;  Surgeon: Rolm Bookbinder, MD;  Location: [facility];  Service: General;  Laterality: Right;  . Insert / replace / remove pacemaker  2/10  . Laparotomy Bilateral 10/03/2012    Procedure: EXPLORATORY LAPAROTOMY;  Surgeon: Imagene Gurney A. Alycia Rossetti, MD;  Location: [facility];  Service: Gynecology;  Laterality: Bilateral;  WITH BSO, TUMOR DEBULKING  . Colostomy revision N/A 10/03/2012    Procedure: COLOSTOMY REVISION;  Surgeon: Rolm Bookbinder, MD;  Location: [facility];  Service: General;  Laterality: N/A;  COLOSTOMY REVISION, Sigmoid colectomy  . US echocardiography  07/17/2008    EF =>55%,mild mitral annular ca+,mild-mod MR,TR,AOV mildly sclerotic  . Nm myoview ltd  07/17/2008    no ischemia  . Port-a-cath  removal N/A 02/21/2013    Procedure: REMOVAL PORT-A-CATH;  Surgeon: Rolm Bookbinder, MD;  Location: [facility];  Service: General;  Laterality: N/A;    FAMILY HISTORY:  family history includes Heart disease in her mother.  SOCIAL HISTORY:  reports that she has never smoked. She has never used smokeless tobacco. She reports that she does not drink alcohol or use illicit drugs.  ALLERGIES: Pindolol; Iodine; Shellfish allergy; Vibramycin; Doxycycline; Levofloxacin; Multaq; and Vicodin  MEDICATIONS:  No current facility-administered medications for this encounter.   No current outpatient prescriptions on file.   Facility-Administered Medications Ordered in Other Encounters  Medication Dose Route Frequency Provider Last Rate Last Dose  . acetaminophen (TYLENOL) tablet 650 mg  650 mg Oral Q6H PRN Ravisankar R Avva, MD       Or  . acetaminophen (TYLENOL) suppository 650 mg  650 mg Rectal Q6H PRN Ravisankar R Avva, MD      . diltiazem (CARDIZEM) tablet 60 mg  60 mg Oral 4 times per day Ravisankar R Avva, MD   60 mg at 09/04/13 1312  . feeding supplement (ENSURE COMPLETE) (ENSURE COMPLETE) liquid 237 mL  237 mL Oral TID WC Ravisankar R Avva, MD   237 mL at 09/04/13 1800  . heparin 6,000 Units in sodium chloride irrigation 0.9 % 500 mL irrigation   Irrigation Once Rolm Bookbinder, MD      . hyoscyamine (LEVSIN SL) SL tablet 0.125 mg  0.125 mg Sublingual Q4H PRN Blair Dolphin, NP      . iron polysaccharides (NIFEREX) capsule 150 mg  150 mg Oral Daily Leanna Battles, MD   150 mg at 09/04/13 1046  . lactose free nutrition (BOOST PLUS) liquid 237 mL  237 mL Oral Daily Ravisankar R Avva, MD   237 mL at 09/04/13 1045  . sodium chloride 0.9 % injection 10 mL  10 mL Intracatheter PRN Ladell Pier, MD   10 mL at 07/18/12 1248  . sodium chloride 0.9 % injection 3 mL  3 mL Intravenous Q12H Ravisankar R Avva, MD   3 mL at 09/05/13 1509  . sodium chloride 0.9 % injection           . zolpidem (AMBIEN) tablet 5 mg  5 mg Oral QHS PRN Ravisankar R Avva, MD        REVIEW OF SYSTEMS:  A 15 point review of systems is documented in the electronic medical record. This was  obtained by the nursing staff. However, I reviewed this with the patient to discuss relevant findings and make appropriate changes.  Other than hematuria issues patient is been feeling well. She denies any pain within the pelvis region. Her colostomy has been working well. She denies any vaginal bleeding or bleeding from the rectal region. She denies any cough or breathing problems. Her appetite is good.   PHYSICAL EXAM: This is a very pleasant 80 year old female who is sitting up in her hospital chair. She is accompanied by her daughter-in-law on evaluation this afternoon. Examination of the neck and supraclavicular region reveals no evidence for adenopathy. The lungs are clear to auscultation. The heart has a regular rhythm and rate. Vital signs are stable. Abdomen is soft and nontender with normal bowel sounds. On neurological examination motor strength is 5 out of 5 in the proximal and distal muscle groups of the upper or lower committees.    ECOG = 1   1 - Symptomatic but completely ambulatory (Restricted in physically strenuous activity but ambulatory and able  to carry out work of a light or sedentary nature. For example, light housework, office work)   LABORATORY DATA:  Lab Results  Component Value Date   WBC 8.0 09/05/2013   HGB 7.3* 09/05/2013   HCT 21.3* 09/05/2013   MCV 95.5 09/05/2013   PLT 143* 09/05/2013   Lab Results  Component Value Date   NA 136* 09/04/2013   K 4.4 09/04/2013   CL 103 09/04/2013   CO2 26 09/04/2013   Lab Results  Component Value Date   ALT 15 09/03/2013   AST 21 09/03/2013   ALKPHOS 83 09/03/2013   BILITOT 0.4 09/03/2013     RADIOGRAPHY: X-ray Chest Pa And Lateral   09/03/2013   CLINICAL DATA:  Preop for pelvic mass.  EXAM: CHEST  2 VIEW  COMPARISON:  09/28/2012.  FINDINGS: The power port is been removed. The heart is in large but stable. There is tortuosity and calcification of the thoracic aorta. Chronic bronchitic and emphysematous lung changes but no definite  acute pulmonary findings. No pleural effusion. Pacer wires are stable.  IMPRESSION: Stable mild cardiac enlargement and chronic lung changes but no acute pulmonary findings.   Electronically Signed   By: Kalman Jewels M.D.   On: 09/03/2013 07:29   CT ABDOMEN AND PELVIS WITHOUT AND WITH CONTRAST  A necrotic appearing mass occupies the right aspect of the bladder, measuring 6.1 x 8.2 cm. Air and a Foley catheter are seen in the bladder. No pathologically enlarged lymph nodes. Atherosclerotic calcification of the arterial vasculature without abdominal aortic aneurysm. No worrisome lytic or sclerotic lesions. Degenerative changes are seen in the spine. Old posterior right rib fractures.  IMPRESSION: 1. Large bladder mass is most consistent with transitional cell carcinoma. No definite synchronous lesion in the upper urinary tract.      IMPRESSION: Probable recurrent ovarian cancer versus primary bladder carcinoma. Patient is having significant symptoms with gross hematuria requiring transfusions. Patient would be a candidate for palliative radiation therapy directed at the bladder mass. I discussed the treatment course side effects and potential toxicities of radiation therapy in this situation with the patient and her family. She appears to understand and wishes to proceed with planned course of treatment. Interventional radiology has reviewed the patient's situation and will hold on embolization unless severe bleeding occurs. There would be a risk for bladder  ischemia with this procedure.  PLAN: Simulation and planning as well as first treatment tomorrow. Anticipate 10 treatments directed at the bladder mass.  I spent 60 minutes minutes face to face with the patient and more than 50% of that time was spent in counseling and/or coordination of care.   ------------------------------------------------  -----------------------------------  Blair Promise, PhD, MD

## 2013-09-05 NOTE — Anesthesia Preprocedure Evaluation (Addendum)
Anesthesia Evaluation  Patient identified by MRN, date of birth, ID band Patient awake    Reviewed: Allergy & Precautions, H&P , NPO status , Patient's Chart, lab work & pertinent test results  History of Anesthesia Complications (+) PONV and history of anesthetic complications  Airway Mallampati: II TM Distance: >3 FB Neck ROM: Full    Dental no notable dental hx.    Pulmonary neg pulmonary ROS,  breath sounds clear to auscultation  Pulmonary exam normal       Cardiovascular Exercise Tolerance: Good hypertension, Pt. on medications + CAD + dysrhythmias + pacemaker Rhythm:Regular Rate:Normal  Pacemaker.   Neuro/Psych Anxiety negative neurological ROS     GI/Hepatic negative GI ROS, Neg liver ROS,   Endo/Other  negative endocrine ROS  Renal/GU negative Renal ROS  negative genitourinary   Musculoskeletal negative musculoskeletal ROS (+)   Abdominal   Peds negative pediatric ROS (+)  Hematology negative hematology ROS (+)   Anesthesia Other Findings   Reproductive/Obstetrics negative OB ROS                          Anesthesia Physical  Anesthesia Plan  ASA: III  Anesthesia Plan: General   Post-op Pain Management:    Induction: Intravenous  Airway Management Planned: LMA  Additional Equipment:   Intra-op Plan:   Post-operative Plan:   Informed Consent: I have reviewed the patients History and Physical, chart, labs and discussed the procedure including the risks, benefits and alternatives for the proposed anesthesia with the patient or authorized representative who has indicated his/her understanding and acceptance.   Dental advisory given  Plan Discussed with: CRNA  Anesthesia Plan Comments:         Anesthesia Quick Evaluation

## 2013-09-05 NOTE — Progress Notes (Signed)
Subjective: Continues to have gross hematuria, and she denies pelvic pain, dyspnea, or chest pain.  Objective: Vital signs in last 24 hours: Temp:  [97.5 F (36.4 C)-98.1 F (36.7 C)] 98 F (36.7 C) (02/10 2326) Pulse Rate:  [76-88] 76 (02/11 0654) Resp:  [15-25] 25 (02/11 0654) BP: (109-133)/(48-104) 126/60 mmHg (02/11 0536) SpO2:  [92 %-97 %] 96 % (02/11 0654) Weight change:    Intake/Output from previous day: 02/10 0701 - 02/11 0700 In: 14632.5 [P.O.:840; I.V.:1792.5] Out: 16800 [Urine:16600; Stool:200]   General appearance: alert, cooperative and no distress Resp: clear to auscultation bilaterally Cardio: regular rate and rhythm GI: soft, non-tender; bowel sounds normal; no masses,  no organomegaly  Lab Results:  Recent Labs  09/04/13 0330 09/04/13 1845 09/05/13 0328  WBC 5.4  --  8.0  HGB 8.8* 8.9* 7.3*  HCT 25.7* 26.0* 21.3*  PLT 148*  --  143*   BMET  Recent Labs  09/03/13 0352 09/04/13 0330  NA 134* 136*  K 4.0 4.4  CL 100 103  CO2 25 26  GLUCOSE 114* 100*  BUN 11 7  CREATININE 0.78 0.85  CALCIUM 8.2* 8.0*   CMET CMP     Component Value Date/Time   NA 136* 09/04/2013 0330   NA 135* 11/21/2012 0920   K 4.4 09/04/2013 0330   K 4.3 11/21/2012 0920   CL 103 09/04/2013 0330   CL 103 11/21/2012 0920   CO2 26 09/04/2013 0330   CO2 25 11/21/2012 0920   GLUCOSE 100* 09/04/2013 0330   GLUCOSE 101* 11/21/2012 0920   BUN 7 09/04/2013 0330   BUN 13.3 11/21/2012 0920   CREATININE 0.85 09/04/2013 0330   CREATININE 0.8 11/21/2012 0920   CALCIUM 8.0* 09/04/2013 0330   CALCIUM 8.7 11/21/2012 0920   PROT 5.2* 09/03/2013 0352   PROT 6.7 10/24/2012 0840   ALBUMIN 2.9* 09/03/2013 0352   ALBUMIN 3.3* 10/24/2012 0840   AST 21 09/03/2013 0352   AST 17 10/24/2012 0840   ALT 15 09/03/2013 0352   ALT 8 10/24/2012 0840   ALKPHOS 83 09/03/2013 0352   ALKPHOS 89 10/24/2012 0840   BILITOT 0.4 09/03/2013 0352   BILITOT 0.29 10/24/2012 0840   GFRNONAA 61* 09/04/2013 0330   GFRAA 71* 09/04/2013  0330    CBG (last 3)  No results found for this basename: GLUCAP,  in the last 72 hours  INR RESULTS:   Lab Results  Component Value Date   INR 1.15 09/04/2013   INR 1.69* 09/03/2013   INR 2.64* 09/02/2013   PROTIME 26.4* 12/05/2012   PROTIME 24.0* 11/21/2012   PROTIME 27.6* 10/31/2012     Studies/Results: No results found.  Medications: I have reviewed the patient's current medications.  Assessment/Plan: #1 Hematuria: continues with plan for biopsy today to determine ovarian versus bladder origin of mass. Possible embolization of tumor to stop bleeding.  #2 Atrial Fibrillation: stable with a controlled ventricular rate.   #3 Anemia: interval significant drop in Hct with transfusion of PRBCs ordered.   LOS: 3 days   Sherion Dooly G 09/05/2013, 7:24 AM

## 2013-09-06 ENCOUNTER — Telehealth: Payer: Self-pay | Admitting: *Deleted

## 2013-09-06 ENCOUNTER — Encounter (HOSPITAL_COMMUNITY): Payer: Self-pay | Admitting: Urology

## 2013-09-06 ENCOUNTER — Ambulatory Visit
Admit: 2013-09-06 | Discharge: 2013-09-06 | Disposition: A | Discharge status: Home / Self Care | Payer: Medicare Other | Attending: Radiation Oncology | Admitting: Radiation Oncology

## 2013-09-06 DIAGNOSIS — D500 Iron deficiency anemia secondary to blood loss (chronic): Secondary | ICD-10-CM

## 2013-09-06 DIAGNOSIS — Z79899 Other long term (current) drug therapy: Secondary | ICD-10-CM | POA: Insufficient documentation

## 2013-09-06 DIAGNOSIS — C569 Malignant neoplasm of unspecified ovary: Secondary | ICD-10-CM

## 2013-09-06 DIAGNOSIS — N3289 Other specified disorders of bladder: Secondary | ICD-10-CM

## 2013-09-06 DIAGNOSIS — Z510 Encounter for antineoplastic radiation therapy: Secondary | ICD-10-CM | POA: Insufficient documentation

## 2013-09-06 DIAGNOSIS — C679 Malignant neoplasm of bladder, unspecified: Secondary | ICD-10-CM | POA: Insufficient documentation

## 2013-09-06 DIAGNOSIS — R319 Hematuria, unspecified: Secondary | ICD-10-CM

## 2013-09-06 DIAGNOSIS — R350 Frequency of micturition: Secondary | ICD-10-CM | POA: Insufficient documentation

## 2013-09-06 DIAGNOSIS — R300 Dysuria: Secondary | ICD-10-CM | POA: Insufficient documentation

## 2013-09-06 LAB — COMPREHENSIVE METABOLIC PANEL
ALBUMIN: 2.4 g/dL — AB (ref 3.5–5.2)
ALK PHOS: 76 U/L (ref 39–117)
ALT: 10 U/L (ref 0–35)
AST: 19 U/L (ref 0–37)
BUN: 7 mg/dL (ref 6–23)
CO2: 26 mEq/L (ref 19–32)
Calcium: 8.2 mg/dL — ABNORMAL LOW (ref 8.4–10.5)
Chloride: 100 mEq/L (ref 96–112)
Creatinine, Ser: 0.88 mg/dL (ref 0.50–1.10)
GFR calc Af Amer: 68 mL/min — ABNORMAL LOW (ref >90)
GFR calc non Af Amer: 59 mL/min — ABNORMAL LOW (ref >90)
Glucose, Bld: 108 mg/dL — ABNORMAL HIGH (ref 70–99)
POTASSIUM: 3.9 meq/L (ref 3.7–5.3)
SODIUM: 134 meq/L — AB (ref 137–147)
TOTAL PROTEIN: 4.9 g/dL — AB (ref 6.0–8.3)
Total Bilirubin: 0.3 mg/dL (ref 0.3–1.2)

## 2013-09-06 LAB — CBC
HCT: 29.7 % — ABNORMAL LOW (ref 36.0–46.0)
Hemoglobin: 10 g/dL — ABNORMAL LOW (ref 12.0–15.0)
MCH: 31.1 pg (ref 26.0–34.0)
MCHC: 33.7 g/dL (ref 30.0–36.0)
MCV: 92.2 fL (ref 78.0–100.0)
PLATELETS: 144 10*3/uL — AB (ref 150–400)
RBC: 3.22 MIL/uL — ABNORMAL LOW (ref 3.87–5.11)
RDW: 15.2 % (ref 11.5–15.5)
WBC: 8 10*3/uL (ref 4.0–10.5)

## 2013-09-06 NOTE — Progress Notes (Signed)
  Radiation Oncology         (336) [phone] ________________________________  Name: Victoria [name] MRN: [id number]  Date: 09/06/2013  DOB: 01/30/1927  SIMULATION AND TREATMENT PLANNING NOTE  DIAGNOSIS:  Probable recurrent ovarian cancer  NARRATIVE:  The patient was brought to the [facility] suite.  Identity was confirmed.  All relevant records and images related to the planned course of therapy were reviewed.  The patient freely provided informed written consent to proceed with treatment after reviewing the details related to the planned course of therapy. The consent form was witnessed and verified by the simulation staff.  Then, the patient was set-up in a stable reproducible  supine position for radiation therapy.  CT images were obtained.  Surface markings were placed.  The CT images were loaded into the planning software.  Then the target and avoidance structures were contoured.  Treatment planning then occurred.  The radiation prescription was entered and confirmed.  Then, I designed and supervised the construction of a total of 6 medically necessary complex treatment devices.  I have requested : 3D Simulation  I have requested a DVH of the following structures: GTV, fem head/ necks, rectum, small bowel.  I have ordered:dose calc.  PLAN:  The patient will receive 30 Gy in 10 fractions.  ________________________________

## 2013-09-06 NOTE — Progress Notes (Signed)
Patient ID: Victoria [name], female   DOB: [birth year], 80 y.o.   MRN: [id number]  Pt without complaint. Today. Has some urgency but voiding with a good flow and urine remains "pink".  Imp - plan - Likely recurrent and locally advanced ovarian Ca - path pending - s/p TURBT, fulguration. I discussed patient with Dr. Alycia Rossetti yesterday and Dr. Skeet Latch today. They feel like radiation could shrink the tumor, but may take a few fractions. I discussed with the patient and daughter-in-law she may bleed again. In the event of gross hematuria, CBI may need to be restarted. If hemorrhage/transfusion requirements are reasonable, then a repeat cysto/fulguration (bipolar button was effective - on CT the mass has a necrotic core and about a 2-cm avidly enhancing, vascular layer - fulguration of this layer is effective, but given the size of the mass would require a staged procedure to complete) and continued XRT should be attempted as continued blood flow to bladder and tumor would aid in XRT and chemo effectiveness and eliminate risk of bladder necrosis from embolization. As a last resort, if bleeding severe, embolization could be attempted. I discussed above with patient, daughter-in-law.  As a side note, the Aug 31, 2013 CT A/P will load by viewing any of her other images in Epic, but it is not listed in Epic.

## 2013-09-06 NOTE — Progress Notes (Signed)
Subjective: Asleep and is comfortable, reviewed op report from yesterdays cystoscopy.  Objective: Vital signs in last 24 hours: Temp:  [97.8 F (36.6 C)-98.8 F (37.1 C)] 98.4 F (36.9 C) (02/12 0336) Pulse Rate:  [74-89] 78 (02/12 0548) Resp:  [13-24] 18 (02/12 0548) BP: (99-147)/(56-74) 129/64 mmHg (02/12 0548) SpO2:  [94 %-100 %] 94 % (02/12 0548) Weight:  [63.9 kg (140 lb 14 oz)] 63.9 kg (140 lb 14 oz) (02/12 0548) Weight change:    Intake/Output from previous day: 02/11 0701 - 02/12 0700 In: 5523.8 [P.O.:960; I.V.:1216.3; Blood:347.5] Out: 5126 [Urine:4925; Stool:1; Blood:200]   General appearance: asleep and comfortable while partially upright Resp: clear to auscultation bilaterally Cardio: regular rate and rhythm  Lab Results:  Recent Labs  09/05/13 0328 09/06/13 0355  WBC 8.0 8.0  HGB 7.3* 10.0*  HCT 21.3* 29.7*  PLT 143* 144*   BMET  Recent Labs  09/04/13 0330 09/06/13 0355  NA 136* 134*  K 4.4 3.9  CL 103 100  CO2 26 26  GLUCOSE 100* 108*  BUN 7 7  CREATININE 0.85 0.88  CALCIUM 8.0* 8.2*   CMET CMP     Component Value Date/Time   NA 134* 09/06/2013 0355   NA 135* 11/21/2012 0920   K 3.9 09/06/2013 0355   K 4.3 11/21/2012 0920   CL 100 09/06/2013 0355   CL 103 11/21/2012 0920   CO2 26 09/06/2013 0355   CO2 25 11/21/2012 0920   GLUCOSE 108* 09/06/2013 0355   GLUCOSE 101* 11/21/2012 0920   BUN 7 09/06/2013 0355   BUN 13.3 11/21/2012 0920   CREATININE 0.88 09/06/2013 0355   CREATININE 0.8 11/21/2012 0920   CALCIUM 8.2* 09/06/2013 0355   CALCIUM 8.7 11/21/2012 0920   PROT 4.9* 09/06/2013 0355   PROT 6.7 10/24/2012 0840   ALBUMIN 2.4* 09/06/2013 0355   ALBUMIN 3.3* 10/24/2012 0840   AST 19 09/06/2013 0355   AST 17 10/24/2012 0840   ALT 10 09/06/2013 0355   ALT 8 10/24/2012 0840   ALKPHOS 76 09/06/2013 0355   ALKPHOS 89 10/24/2012 0840   BILITOT 0.3 09/06/2013 0355   BILITOT 0.29 10/24/2012 0840   GFRNONAA 59* 09/06/2013 0355   GFRAA 68* 09/06/2013 0355    CBG  (last 3)  No results found for this basename: GLUCAP,  in the last 72 hours  INR RESULTS:   Lab Results  Component Value Date   INR 1.15 09/04/2013   INR 1.69* 09/03/2013   INR 2.64* 09/02/2013   PROTIME 26.4* 12/05/2012   PROTIME 24.0* 11/21/2012   PROTIME 27.6* 10/31/2012     Studies/Results: No results found.  Medications: I have reviewed the patient's current medications.  Assessment/Plan: #1 Hematuria: has slowed with removal of catheter. Embolization may be needed if significant bleeding with radiation therapy, await path results. If she remains stable may be able to transfer her out of stepdown unit tomorrow, with discharge if and when hematuria resolves. #2 Anemia: significant improvement after transfusion of PRBC yesterday. #3 History of Atrial fibrillation: stable with telemetry showing her to be in a sinus rhythm.   LOS: 4 days   Killian Ress G 09/06/2013, 6:59 AM

## 2013-09-06 NOTE — Progress Notes (Addendum)
IP PROGRESS NOTE  Subjective:   Victoria [name] is well-known to me with a history of ovarian cancer. She has been in clinical remission since completing Taxol and carboplatin chemotherapy in April of 2014. She was admitted 09/02/2013 with gross hematuria. A CT at the urology Center revealed a pelvic mass involving the bladder.  She was taken to a cystoscopy and TURBT/fulguration procedure by Dr. Junious Silk on 09/05/2013. A large tumor was noted at the right posterior lateral wall of the bladder. The tumor was vascular and friable. The tumor bled with manipulation and required fulguration to stop bleeding.  She reports the hematuria has improved. Victoria [name] feels well aside from the hematuria.  Interventional radiology was consulted to consider an embolization procedure if needed. Dr. Sondra Come was consulted and plans to begin palliative radiation today.   Objective: Vital signs in last 24 hours: Blood pressure 112/51, pulse 69, temperature 97.6 F (36.4 C), temperature source Oral, resp. rate 18, height 5\' 1"  (1.549 m), weight 140 lb 14 oz (63.9 kg), SpO2 99.00%.  Intake/Output from previous day: 02/11 0701 - 02/12 0700 In: 5523.8 [P.O.:960; I.V.:1216.3; Blood:347.5] Out: 5126 [Urine:4925; Stool:1; Blood:200]  Physical Exam:  HEENT: Neck without mass Lungs: Decreased breath sounds at the right lower chest, no respiratory distress Cardiac: Regular rate and rhythm Abdomen: No hepatosplenomegaly, nontender, no mass, left lower quadrant colostomy Extremities: No leg edema Lymph nodes: No cervical, supraclavicular, axillary, or inguinal nodes    Lab Results:  Recent Labs  09/05/13 0328 09/06/13 0355  WBC 8.0 8.0  HGB 7.3* 10.0*  HCT 21.3* 29.7*  PLT 143* 144*    BMET  Recent Labs  09/04/13 0330 09/06/13 0355  NA 136* 134*  K 4.4 3.9  CL 103 100  CO2 26 26  GLUCOSE 100* 108*  BUN 7 7  CREATININE 0.85 0.88  CALCIUM 8.0* 8.2*    Studies/Results: No results  found.  Medications: I have reviewed the patient's current medications.  Assessment/Plan: 1. Ovarian cancer-presenting with a rectal mass , status post sigmoidoscopy 03/13/2012 with findings of a 5 cm malignant appearing mass with central ulceration in the rectum 2-3 cm proximal to the anal verge. Pathology showed moderate to poorly differentiated adenocarcinoma with ulceration and prolapse changes. By immunohistochemistry the malignant cells were positive for cytokeratin 7, estrogen receptor and WT-1. They were negative for cytokeratin 20 and CDX-2. This immunohistochemical profile was strongly suggestive of a gynecologic primary. -Status post 3 cycles of Taxol/carboplatin chemotherapy with normalization of the CA 125, restaging CT 08/07/2012 with marked improvement in the pelvic masses and no evidence of progressive ovarian cancer. She completed cycle 4 of Taxol/carboplatin beginning 08/08/2012. 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 She completed cycle 6 Taxol/carboplatin beginning 11/21/2012.  2. Atrial fibrillation maintained on Coumadin.   3. Status post Port-A-Cath placement 05/04/2012. Port-A-Cath removal 02/21/2013  4. 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  5.   admission 09/02/2013 with gross hematuria-right pelvic mass with involvement of the bladder confirmed on CT with a cystoscopy confirming a large tumor in the bladder with associated bleeding, status post TURBT and fulguration, pathology pending  6.   anemia secondary to bleeding    [name] was admitted with gross hematuria. There is a mass involving the bladder. The mass is most likely related to progression of ovarian cancer, though a primary bladder tumor is in the differential diagnosis.  I discussed treatment options with [name]. I agree with the  plan for palliative radiation. She does not wish to consider a cystectomy. I discussed the case with Dr. Sondra Come.  We can consider palliative chemotherapy after the completion of radiation. She had an excellent response to chemotherapy in 2014 and there is a significant chance the ovarian cancer would respond to second line therapy. I will check a CA 125 level.  Please call oncology as needed while she is in the hospital. I will schedule outpatient followup after the completion of radiation.     LOS: 4 days   [name]  09/06/2013, 5:38 PM

## 2013-09-06 NOTE — Consult Note (Signed)
Consult requested by Dr Junious Silk for the evaluation of recurrent ovarian cancer and hematuria.  ASSESSMENT PLAN:  This is a lovely 80 year old was without evidence of disease following completion of treatment of her ovarian cancer in May of 2014. She now presents with evidence of a pelvic mass and erosion into the bladder. She denies abdominal pain there is no sign or stigmata of peritoneal carcinomatosis and she has good bowel function. Discussions were had today with both  Dr. Junious Silk and Dr. Sondra Come regarding the immediate  management of the hematuria from recurrent disease.  The recommendation is for external beam radiotherapy particularly since the lesion appears to be confined to one area  the pelvis.    After completion of external beam therapy consideration should be given for adjuvant chemotherapy. Options include single agent carboplatin or Doxil or  combination therapy with Taxol/carboplatin or carboplatin/Doxil.    HPI  Victoria [name] is a lovely 80 y.o. .who noted a change in bowel habits- intermittent diarrhea- in May 2013. Barium enema on 12/22/2011 showed moderately redundant colon with moderate to severe diverticulosis primarily from the splenic flexure and throughout the sigmoid. She developed rectal bleeding in June 2013. Pelvic examination by her gynecologist was notable for a rectal mass.   CT scans of the abdomen and pelvis on 03/02/2012 showed a rectal mass measuring 6.7 cm and 2 adjacent nodal masses in the right mid abdomen measuring 8.9 x 7.4 x 7.2 cm suspicious for nodal metastases and an additional 3.0 x 3.0 x 2.6 cm nodal metastasis posterior to the cecum. A sigmoidoscopy on 03/13/2012 showed a 5 cm malignant appearing mass with central ulceration in the rectum about 2-3 cm proximal to the anal verge. Pathology showed moderate to poorly differentiated adenocarcinoma with ulceration and prolapse changes. Further immuniohistochemistry staining strongly suggested a Gyn primary.   Initial CA125 225. The patient underwent exploratory laparotomy small bowel diversion and descending colostomy performed by Dr. Rolm Bookbinder. She received  4 cycles of dose dense Taxol carboplatin therapy with normalization of her CA 125.   Imaging from 08/07/2012 demonstrated marked ear complete resolution of the pelvic masses bilaterally the right-sided lesion now measures only 5.6 x 2.2 cm and the right sided lesion 3.1 x 3.0 cm.   On  10/03/2012 she underwent exploratory laparotomy bilateral salpingo-oophorectomy tumor debulking with revision of the colostomy.  She is not been seen by GYN oncology service since. She received an additional 2 cycles of Taxol carboplatin chemotherapy which was completed on 11/28/2012. Victoria [name]  did very well until she noted spotting in December 2014. She was seen by Dr. Milta Deiters and referred to Dr. Junious Silk for evaluation of hematuria.  She noted worsening hematuria on February 5 and 6th 2015. She presented to the urology office is on August 31, 2013  at which time a CT scan was ordered and a Foley catheter was inserted. The catheter had to be replaced because of blood clots. She then presented to the emergency room on Sunday, September 03, 2013   Cystoscopy performed on September 05, 2013 was notable for a wide based tumor within the bladder very easily friable even to touch. The Foley was replaced with once again removed because of concern of the balloon itself precipitated bleeding.  I cannot access the images or the report of the CT scan however review of the notes indicate that there is an 8 cm pelvic mass eroding into the bladder no comment is made regarding metastatic disease.    Past Medical  History  Diagnosis Date  . Hyperlipidemia   . Rectal carcinoma   . Colon cancer 03/13/12 egd    rectal mass5cm  , 2-3cm proximal to anal verge  . Diverticulosis   . Allergy   . Anxiety     rhematoid  . Colostomy in place   . PONV (postoperative nausea and  vomiting)   . Chronic anticoagulation   . Fluid collection (edema) in the arms, legs, hands and feet   . Urinary frequency     with burning x 2 days  . History of blood transfusion   . Hypertension   . Pacemaker   . Coronary artery disease     DR. CROITORU IS PT'S CARDIOLOGIST  . Atrial fibrillation   . Sinus node dysfunction 09/10/2008    Pacemaker [employer]  . RBBB   . PAT (paroxysmal atrial tachycardia)      Past Surgical History  Procedure Laterality Date  . Permanent pacemaker insertion  09/10/2008    [employer]  . Rib removed  1934    Pleurisy and pneumonia  . Partial hysterectomy  1972  . Bladder neck suspension  1986  . Heart catherization  [id number]  . Abdominal hysterectomy      both ovaries intact,   . Colon resection  04/04/2012    Procedure: COLON RESECTION;  Surgeon: Rolm Bookbinder, MD;  Location: [facility];  Service: General;  Laterality: N/A;  laparotomy with small bowel resection for obstruction with colostomy with gastrostomy tube placement.   . Portacath placement  05/04/2012    Procedure: INSERTION PORT-A-CATH;  Surgeon: Rolm Bookbinder, MD;  Location: [facility];  Service: General;  Laterality: Right;  . Insert / replace / remove pacemaker  2/10  . Laparotomy Bilateral 10/03/2012    Procedure: EXPLORATORY LAPAROTOMY;  Surgeon: Imagene Gurney A. Alycia Rossetti, MD;  Location: [facility];  Service: Gynecology;  Laterality: Bilateral;  WITH BSO, TUMOR DEBULKING  . Colostomy revision N/A 10/03/2012    Procedure: COLOSTOMY REVISION;  Surgeon: Rolm Bookbinder, MD;  Location: [facility];  Service: General;  Laterality: N/A;  COLOSTOMY REVISION, Sigmoid colectomy  . US echocardiography  07/17/2008    EF =>55%,mild mitral annular ca+,mild-mod MR,TR,AOV mildly sclerotic  . Nm myoview ltd  07/17/2008    no ischemia  . Port-a-cath removal N/A 02/21/2013    Procedure: REMOVAL PORT-A-CATH;  Surgeon: Rolm Bookbinder, MD;  Location: [facility];  Service: General;  Laterality: N/A;  . Transurethral  resection of bladder tumor with gyrus (turbt-gyrus) N/A 09/05/2013    Procedure: TRANSURETHRAL RESECTION OF BLADDER TUMOR WITH GYRUS (TURBT-GYRUS);  Surgeon: Fredricka Bonine, MD;  Location: [facility];  Service: Urology;  Laterality: N/A;      ROS ; denies cough chest pain hemoptysis. No abdominal pain reports ostomy is functioning well. Denies rectal bleeding, states that her urine is pink tinged. Reports excellent appetite no shortness of breath or chest pain denies changes in her weight.   Physical Exam  WD female in NAD  BP 112/51  Pulse 69  Temp(Src) 97.6 F (36.4 C) (Oral)  Resp 18  Ht 5\' 1"  (1.549 m)  Wt 140 lb 14 oz (63.9 kg)  BMI 26.63 kg/m2  SpO2 99% Wt Readings from Last 3 Encounters:  09/06/13 140 lb 14 oz (63.9 kg)  09/06/13 140 lb 14 oz (63.9 kg)  07/24/13 140 lb 4.8 oz (63.64 kg)   HEENT: Normocephalic, NROM  LN; No cervical supraclavicular or inguinal adenopathy  ABDNOMEN: Soft distended no palpable omental cake no tenderness or  guarding, functioning colostomy nontender BACK: No CVA tenderness  CHEST: Symmetrical chest excursions  HEART: RRR  PELVIC: Normal external genitalia Bartholin's urethra and Skene's glands. No palpable masses within the vagina or cul-de-sac RECTAL: Good tone no cul-de-sac masses no rectovaginal septum nodularity EXT: 2+ lower extremity edema

## 2013-09-06 NOTE — Progress Notes (Signed)
  Radiation Oncology         [phone] ________________________________  Name: Victoria [name] MRN: [id number]  Date: 09/06/2013  DOB: 04-07-1927  Simulation Verification Note  Status: inpatient  NARRATIVE: The patient was brought to the treatment unit and placed in the planned treatment position. The clinical setup was verified. Then port films were obtained and uploaded to the radiation oncology medical record software.  The treatment beams were carefully compared against the planned radiation fields. The position location and shape of the radiation fields was reviewed. They targeted volume of tissue appears to be appropriately covered by the radiation beams. Organs at risk appear to be excluded as planned.  Based on my personal review, I approved the simulation verification. The patient's treatment will proceed as planned.  -----------------------------------  Blair Promise, PhD, MD

## 2013-09-06 NOTE — Telephone Encounter (Signed)
Fax received and given to [facility], [place].

## 2013-09-06 NOTE — Progress Notes (Addendum)
Patient received from radiation oncology via wheelchair, alert and oriented.  She is ambulating in the room without problems, NSL in place, site unremarkable.  Patient oriented to floor and room, ordering meals, no complaints of pain.  Patient has a colostomy which she cares for and has pink tinged urine when voiding.

## 2013-09-06 NOTE — Telephone Encounter (Signed)
Call from Santiago Glad, nurse w/ Radiation Oncology.  Stated she faxed a form for Dr. Sallyanne Kuster to sign earlier this morning and they need it back ASAP.  Stated it is an emergency b/c pt is receiving treatment now.  Informed Pamala Hurry will be notified so it can be faxed back.  Dr. Rosie Fate, [place] notified and no fax received.  RN was able to find contact number for Santiago Glad and called back.  Informed no fax received.  Santiago Glad will fax it again to [employer].  Will give to Dr. Loletha Grayer once received.

## 2013-09-07 ENCOUNTER — Ambulatory Visit: Payer: Medicare Other

## 2013-09-07 ENCOUNTER — Telehealth: Payer: Self-pay | Admitting: Oncology

## 2013-09-07 ENCOUNTER — Other Ambulatory Visit: Payer: Self-pay | Admitting: *Deleted

## 2013-09-07 DIAGNOSIS — Z7901 Long term (current) use of anticoagulants: Secondary | ICD-10-CM

## 2013-09-07 DIAGNOSIS — R310 Gross hematuria: Secondary | ICD-10-CM | POA: Diagnosis present

## 2013-09-07 DIAGNOSIS — E46 Unspecified protein-calorie malnutrition: Secondary | ICD-10-CM | POA: Diagnosis present

## 2013-09-07 DIAGNOSIS — I4891 Unspecified atrial fibrillation: Secondary | ICD-10-CM

## 2013-09-07 DIAGNOSIS — C679 Malignant neoplasm of bladder, unspecified: Secondary | ICD-10-CM

## 2013-09-07 LAB — CA 125: CA 125: 12.3 U/mL (ref 0.0–30.2)

## 2013-09-07 MED ORDER — POLYSACCHARIDE IRON COMPLEX 150 MG PO CAPS: 150.0000 mg | ORAL_CAPSULE | Freq: Every day | ORAL | Status: DC

## 2013-09-07 MED ORDER — ACETAMINOPHEN 325 MG PO TABS: 650.0000 mg | ORAL_TABLET | Freq: Four times a day (QID) | ORAL | Status: DC | PRN

## 2013-09-07 NOTE — Discharge Summary (Addendum)
Physician Discharge Summary  Patient ID: Victoria [name] MRN: [id number] DOB/AGE: August 01, 1926 80 y.o.  Admit date: 09/02/2013 Discharge date: 09/09/2013   Discharge Diagnoses:  Principal Problem:   Hematuria, gross Active Problems:   Chronic anticoagulation   Acute blood loss anemia   Pelvic mass in female   PAF fib/flutter- Hx RFA 2010   Unspecified protein-calorie malnutrition   S/P [facility] Feb 2006 after failed RFA   Discharged Condition: good  Hospital Course: [name] who has prior extensive ovarian cancer status post resection and treatment with chemotherapy followed by Dr. Learta Codding, she also has a colostomy dating back to resection of her masses back in 2014, she was seen by her oncologist towards the end of December, noted to have worsening with sporadic gross hematuria, also evaluated by gynecology, referred to urology, was scheduled to be seen this coming week however developed worsening gross hematuria last week, seen by urology last week, Foley catheter placed, CT scan revealed very large pelvic mass with bladder involvement with the mass having central necrosis and rapid progression compared with previous year. Question primary bladder cancer versus recurrence of previous latency. Patient is status post total total abd hysterectomy and bilateral salpingo-oophorectomy. Over the weekend and especially today developed worsening gross hematuria leaking around her Foley catheter without clots, she is anticoagulated secondary to history of atrial fibrillation and with pacemaker placement. During the weekend, denies any nausea, vomiting, fevers, chills, chest pain, palpitations, presyncope, dizziness or other focal neurologic deficits. Denies blood in stool. She presented to the emergency room for further evaluation and management, seen by urology who noted therapeutic PT/INR, stable hemoglobin of 11.9, hemodynamic stability, and recommended admission by internal  medicine with correction of anticoagulation with presumed surgery the left this coming week for tissue biopsy and definitive diagnosis of the mass.  She initially had moderately severe gross hematuria, so her coumadin anticoagulation was reversed with vitamin K. The hematuria improved with this. She had a transfusion of 2 units of PRBCs due to severe anemia, and had cystoscopy with TURBT and fulguration of a very friable mass seen on the right posterior and lateral wall of the bladder. A biopsy of the mass was done with results pending. A CA-125 level was only 12.3. She was seen by multiple consultants from urology Phebe Colla), interventional radiology Aletta Edouard), radiation oncology Gery Pray), medical oncology Julieanne Manson), and gyn onc Janie Morning). Embolization of the tumor was considered, although felt likely to cause bladder ischemia, so the plan was to use embolization only if she has significant recurrent hematuria. She had initial external beam radiation therapy to the mass with plan for 10 treatments in total. Subsequent treatments are to be determined when radiation therapy is completed and final pathology results are in. On the day of discharge she felt fine, with no dizziness or shortness of breath, and only a slight pink tinge to her urine. She was not having significant pelvic discomfort as well. There are no complications from her hospitalization. Path results showed high-grade T3 poorly differentiated urothelial carcinoma for which she plans on doing palliative chemoradiation approach with 5-FU and external beam radiation therapy.    Consults: hematology/oncology and urology, gyn oncology, radiation oncology, interventional radiology  Significant Diagnostic Studies:  No results found.  12-lead EKG shows following: Normal sinus rhythm with occasional paced beats, premature atrial contractions, right bundle branch block  Labs: Lab Results  Component Value Date   WBC 8.0  09/06/2013   HGB  10.0* 09/06/2013   HCT 29.7* 09/06/2013   MCV 92.2 09/06/2013   PLT 144* 09/06/2013      Recent Labs Lab 09/06/13 0355  NA 134*  K 3.9  CL 100  CO2 26  BUN 7  CREATININE 0.88  CALCIUM 8.2*  PROT 4.9*  BILITOT 0.3  ALKPHOS 76  ALT 10  AST 19  GLUCOSE 108*       Lab Results  Component Value Date   INR 1.15 09/04/2013   INR 1.69* 09/03/2013   INR 2.64* 09/02/2013   PROTIME 26.4* 12/05/2012   PROTIME 24.0* 11/21/2012   PROTIME 27.6* 10/31/2012     Recent Results (from the past 240 hour(s))  URINE CULTURE     Status: None   Collection Time    09/02/13  3:59 PM      Result Value Ref Range Status   Specimen Description URINE, CATHETERIZED   Final   Special Requests NONE   Final   Culture  Setup Time     Final   Value: 09/02/2013 22:28     Performed at [facility] Count     Final   Value: NO GROWTH     Performed at [facility]   Culture     Final   Value: NO GROWTH     Performed at [facility]   Report Status 09/03/2013 FINAL   Final  MRSA PCR SCREENING     Status: None   Collection Time    09/02/13 11:09 PM      Result Value Ref Range Status   MRSA by PCR NEGATIVE  NEGATIVE Final   Comment:            The GeneXpert MRSA Assay (FDA     approved for NASAL specimens     only), is one component of a     comprehensive MRSA colonization     surveillance program. It is not     intended to diagnose MRSA     infection nor to guide or     monitor treatment for     MRSA infections.      Discharge Exam: Blood pressure 115/53, pulse 74, temperature 97.9 F (36.6 C), temperature source Oral, resp. rate 18, height 5\' 1"  (1.549 m), weight 63.9 kg (140 lb 14 oz), SpO2 95.00%.  Physical Exam: In general, she is an elderly white woman who was in no apparent distress while standing upright in her room. HEENT exam was within normal limits, neck was supple without jugular venous distention, chest was clear to auscultation, heart  had a regular rate and rhythm and was without significant murmur or gallop, abdomen had normal bowel sounds and no hepatosplenomegaly or tenderness, she had bilateral trace ankle edema and bilateral leg varicose veins. She was alert and well oriented with normal affect, and able to move all extremities well.  Disposition: Today she will complete her second fraction of external beam radiation therapy, following which she'll be discharged to home with plans to return to complete outpatient radiation therapy.      Discharge Orders   Future Appointments Provider Department Dept Phone   09/10/2013 3:30 PM [facility] [phone]   09/11/2013 11:30 AM Ladell Pier, [facility] [phone]   09/11/2013 3:30 PM [facility] [phone]   09/12/2013 3:35 PM [facility] [phone]   09/13/2013 3:30 PM [facility] [phone]   09/14/2013 3:30 PM [facility] [phone]   09/17/2013 6:05 PM [facility] [phone]   09/18/2013 4:15 PM [facility] [phone]   09/19/2013 4:45 PM [facility] [phone]   09/20/2013 3:15 PM [facility] [phone]   10/15/2013 10:00 AM [facility] [phone]   10/15/2013 10:30 AM Tommy Medal, RPH-CPP [facility] [phone]   10/23/2013 9:45 AM [facility] [phone]   10/23/2013 10:15 AM Ladell Pier, MD [facility] [phone]   Future  Orders Complete By Expires   Call MD for:  As directed    Comments:     Call for increased blood in the urine, dizziness, difficulty breathing, chest pain, or any other concerning symptoms   Diet - low sodium heart healthy  As directed    Discharge instructions  As directed    Comments:     Do not restart coumadin at home, and take no aspirin or NSAID drugs (examples: motrin, aleve, advil, ibuprofen) as these drugs could result in increased bleeding from the bladder   Increase activity slowly  As directed        Medication List    STOP taking these medications       metoprolol succinate 50 MG 24 hr tablet  Commonly known as:  TOPROL-XL     warfarin 5 MG tablet  Commonly known as:  COUMADIN      TAKE these medications       acetaminophen 325 MG tablet  Commonly known as:  TYLENOL  Take 2 tablets (650 mg total) by mouth every 6 (six) hours as needed for mild pain (or Fever >/= 101).     Cholecalciferol 2000 UNITS Tabs  Take 2,000 Units by mouth daily.     diltiazem 240 MG 24 hr capsule  Commonly known as:  CARDIZEM CD  take 1 capsule by mouth once daily     feeding supplement Liqd  Take 237 mLs by mouth daily.     lactose free nutrition Liqd  Take 237 mLs by mouth daily.     iron polysaccharides 150 MG capsule  Commonly known as:  NIFEREX  Take 1 capsule (150 mg total) by mouth daily.     simvastatin 20 MG tablet  Commonly known as:  ZOCOR  Take 20 mg by mouth every evening.       Follow-up Information   Follow up with Donnajean Lopes, MD. Schedule an appointment as soon as possible for a visit in 1 week.   Specialty:  Internal Medicine   Contact information:   [place] [address] [phone]       Follow up with Fredricka Bonine, MD. (6-8 weeks)    Specialty:  Urology   Contact information:   [facility]  [place] [address] [phone]       Schedule an appointment as soon as  possible for a visit with [name], MD.   Specialty:  Oncology   Contact information:   [place] [address] [phone]       Signed: Leanna Battles  G 09/09/2013, 7:17 AM

## 2013-09-07 NOTE — Progress Notes (Signed)
IP PROGRESS NOTE  Subjective:   Ms. Desaulniers reports no gross hematuria. The urine is "pink ". She has noted fullness at the anal verge since undergoing a rectal exam yesterday. She asked me to check this area.  Objective: Vital signs in last 24 hours: Blood pressure 115/53, pulse 74, temperature 97.9 F (36.6 C), temperature source Oral, resp. rate 18, height 5\' 1"  (1.549 m), weight 140 lb 14 oz (63.9 kg), SpO2 95.00%.  Intake/Output from previous day: 02/12 0701 - 02/13 0700 In: 720 [P.O.:720] Out: 225 [Urine:225]  Physical Exam: GI: Small hemorrhoid at the right side of the anal verge.    Lab Results:  Recent Labs  09/05/13 0328 09/06/13 0355  WBC 8.0 8.0  HGB 7.3* 10.0*  HCT 21.3* 29.7*  PLT 143* 144*    BMET  Recent Labs  09/06/13 0355  NA 134*  K 3.9  CL 100  CO2 26  GLUCOSE 108*  BUN 7  CREATININE 0.88  CALCIUM 8.2*   CA 125 12.3 on 09/06/2013  Studies/Results: No results found.  Medications: I have reviewed the patient's current medications.  Assessment/Plan: 1. Ovarian cancer-presenting with a rectal mass , status post sigmoidoscopy 03/13/2012 with findings of a 5 cm malignant appearing mass with central ulceration in the rectum 2-3 cm proximal to the anal verge. Pathology showed moderate to poorly differentiated adenocarcinoma with ulceration and prolapse changes. By immunohistochemistry the malignant cells were positive for cytokeratin 7, estrogen receptor and WT-1. They were negative for cytokeratin 20 and CDX-2. This immunohistochemical profile was strongly suggestive of a gynecologic primary. -Status post 3 cycles of Taxol/carboplatin chemotherapy with normalization of the CA 125, restaging CT 08/07/2012 with marked improvement in the pelvic masses and no evidence of progressive ovarian cancer. She completed cycle 4 of Taxol/carboplatin beginning 08/08/2012. 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 She completed cycle 6 Taxol/carboplatin beginning 11/21/2012.  2. Atrial fibrillation maintained on Coumadin.   3. Status post Port-A-Cath placement 05/04/2012. Port-A-Cath removal 02/21/2013  4. 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  5.   admission 09/02/2013 with gross hematuria-right pelvic mass with involvement of the bladder confirmed on CT with a cystoscopy confirming a large tumor in the bladder with associated bleeding, status post TURBT and fulguration, pathology consistent with a high-grade urothelial carcinoma.  6.   anemia secondary to bleeding    I discussed the pathology with Ms. Sorter and her son. I reviewed the case with Dr. Junious Silk. Dr. Sondra Come is out today. She appears to have a primary bladder tumor. She does not wish to consider a cystectomy. I contacted Dr. Lisbeth Renshaw and he agrees with discontinuing the current course of palliative radiation. I will see her next week and consult with Dr. Sondra Come. We will likely recommend a more protracted course of radiation with concurrent 5 fluorouracil.  She will be scheduled for an appointment in medical oncology on 09/11/2013.     LOS: 5 days   Laquan Beier  09/07/2013, 2:13 PM

## 2013-09-07 NOTE — Progress Notes (Signed)
Pt declined being set up on [email or web address] due to her not having a computer at home.

## 2013-09-07 NOTE — Telephone Encounter (Signed)
appt scheduled Tania RN will call pt

## 2013-09-07 NOTE — Progress Notes (Signed)
Patient ID: Victoria [name], female   DOB: [birth year], 80 y.o.   MRN: [id number] Patient is doing well this morning. She is happy to be going home. She has no gross hematuria.  Impression / plan  - high grade T3 disease-pathology appears to be a high-grade poorly differentiated urothelial carcinoma. I reviewed the case with Dr. Nicoletta Dress as Dr. Saralyn Pilar is out of the office today and they will send the slides for immunohistochemistry for confirmation. Clinically the mass was palpable and mobile in the right pelvis along the right bladder which is conceivably more consistent with the urothelial cancer as Dr. Skeet Latch mentioned she was expecting to find a lesion fixed to the pelvic sidewall with a GYN malignancy.  Multidisciplinary tumor Board happened to meet this morning and I was able to review the patient and that forum. Given the patient's age and stage it was felt that a palliative approach was best with 5-FU and radiation.   I discussed the patient with Dr. Benay Spice and he reports in his discussions with the patient she did not want cystectomy and he was in agreement with a palliative chemoradiation approach. He will discuss the pathology and continue treatment with Dr. Sondra Come.   I had a long discussion with the patient this morning and whenever the path results. We discussed if this were a primary bladder cancer she would likely need neoadjuvant chemotherapy followed by radical cystectomy and ileal conduit for curative intent. We discussed the nature of cystectomy and ileal conduit. We discussed the long-term prognosis. She does not want to proceed with cystectomy. She said she has lived a long life and spent a lot of time with her grandchildren and does not want to be aggressive with curative intent.   We discussed alternatives to cystectomy for curative intent would be a maximum transurethral resection followed by chemotherapy and radiation. Currently the mass is too advanced and too vascular for a "maximum  TUR"  that we might consider when radiation and chemotherapy are given for curative intent. For this reason TUR alone is not feasible.   She understands and she wants to proceed with chemotherapy radiation for palliative intent primarily to control hematuria. We discussed there is a good likelihood she could develop gross hematuria again and if she does another attempt at resection and fulguration would be reasonable. Again the button electrode seemed to do a good job with fulguration and controlling the bleeding.  In the long run I would want to restage her after she completes her chemotherapy and radiation and at that point it might be worthwhile to go in and try to further resect this mass to control local symptoms in the future.

## 2013-09-09 LAB — TYPE AND SCREEN
ABO/RH(D): O NEG
Antibody Screen: POSITIVE
DAT, IGG: NEGATIVE
UNIT DIVISION: 0
UNIT DIVISION: 0
UNIT DIVISION: 0
Unit division: 0
Unit division: 0
Unit division: 0

## 2013-09-10 ENCOUNTER — Ambulatory Visit: Payer: Medicare Other

## 2013-09-11 ENCOUNTER — Ambulatory Visit: Payer: Medicare Other | Admitting: Oncology

## 2013-09-11 ENCOUNTER — Ambulatory Visit: Payer: Medicare Other

## 2013-09-11 ENCOUNTER — Telehealth: Payer: Self-pay | Admitting: Oncology

## 2013-09-11 ENCOUNTER — Other Ambulatory Visit: Payer: Self-pay | Admitting: *Deleted

## 2013-09-11 NOTE — Telephone Encounter (Signed)
Called Victoria [name] to let her know that her treatment may begin again on Thursday per Dr. Sondra Come.  She is wondering if she can have her treatment before 3:00.  Will ask Linac 1 if time can be moved.  Advised Peggy we would call her back when we know for sure when she will be starting with the appointment time.

## 2013-09-12 ENCOUNTER — Telehealth: Payer: Self-pay | Admitting: Oncology

## 2013-09-12 ENCOUNTER — Ambulatory Visit: Payer: Medicare Other

## 2013-09-12 NOTE — Telephone Encounter (Signed)
gv and printed appt sched and avs for pt for Feb....done

## 2013-09-13 ENCOUNTER — Ambulatory Visit: Payer: Medicare Other

## 2013-09-13 ENCOUNTER — Ambulatory Visit
Admission: RE | Admit: 2013-09-13 | Discharge: 2013-09-13 | Disposition: A | Discharge status: Home / Self Care | Payer: Medicare Other | Source: Ambulatory Visit | Attending: Radiation Oncology | Admitting: Radiation Oncology

## 2013-09-14 ENCOUNTER — Telehealth: Payer: Self-pay | Admitting: Oncology

## 2013-09-14 ENCOUNTER — Ambulatory Visit
Admission: RE | Admit: 2013-09-14 | Discharge: 2013-09-14 | Disposition: A | Discharge status: Home / Self Care | Payer: Medicare Other | Source: Ambulatory Visit | Attending: Radiation Oncology | Admitting: Radiation Oncology

## 2013-09-14 ENCOUNTER — Ambulatory Visit (HOSPITAL_BASED_OUTPATIENT_CLINIC_OR_DEPARTMENT_OTHER): Payer: Medicare Other | Admitting: Oncology

## 2013-09-14 ENCOUNTER — Ambulatory Visit: Payer: Medicare Other

## 2013-09-14 VITALS — BP 142/66 | HR 74 | Resp 18 | Ht 61.0 in | Wt 139.6 lb

## 2013-09-14 DIAGNOSIS — C679 Malignant neoplasm of bladder, unspecified: Secondary | ICD-10-CM

## 2013-09-14 DIAGNOSIS — C569 Malignant neoplasm of unspecified ovary: Secondary | ICD-10-CM

## 2013-09-14 DIAGNOSIS — I4891 Unspecified atrial fibrillation: Secondary | ICD-10-CM

## 2013-09-14 DIAGNOSIS — D649 Anemia, unspecified: Secondary | ICD-10-CM

## 2013-09-14 DIAGNOSIS — C50919 Malignant neoplasm of unspecified site of unspecified female breast: Secondary | ICD-10-CM

## 2013-09-14 DIAGNOSIS — C779 Secondary and unspecified malignant neoplasm of lymph node, unspecified: Secondary | ICD-10-CM

## 2013-09-14 DIAGNOSIS — D500 Iron deficiency anemia secondary to blood loss (chronic): Secondary | ICD-10-CM

## 2013-09-14 NOTE — Progress Notes (Signed)
[place]    OFFICE PROGRESS NOTE   INTERVAL HISTORY:   [name] returns as scheduled. Radiation to the bladder was resumed on 09/13/2013. She reports intermittent hematuria following urination. She passed a clot yesterday. She otherwise feels well. She has noted mild ankle swelling. She relates this to eating salt. The stool has turned dark since beginning iron therapy  Objective:  Vital signs in last 24 hours:  Blood pressure 142/66, pulse 74, resp. rate 18, height 5\' 1"  (1.549 m), weight 139 lb 9.6 oz (63.322 kg), SpO2 98.00%.  Resp: Lungs clear bilaterally with decreased breath sounds at the right lower chest, no respiratory distress Cardio: Regular rate and rhythm GI: Left lower quadrant colostomy, no hepatomegaly, no mass, nontender. Slight fullness in the right suprapubic region Vascular: Trace low pretibial/ankle edema bilaterally     Lab Results:  Lab Results  Component Value Date   WBC 8.0 09/06/2013   HGB 10.0* 09/06/2013   HCT 29.7* 09/06/2013   MCV 92.2 09/06/2013   PLT 144* 09/06/2013   NEUTROABS 3.2 02/13/2013      Medications: I have reviewed the patient's current medications.  Assessment/Plan: 1. Ovarian cancer-presenting with a rectal mass , status post sigmoidoscopy 03/13/2012 with findings of a 5 cm malignant appearing mass with central ulceration in the rectum 2-3 cm proximal to the anal verge. Pathology showed moderate to poorly differentiated adenocarcinoma with ulceration and prolapse changes. By immunohistochemistry the malignant cells were positive for cytokeratin 7, estrogen receptor and WT-1. They were negative for cytokeratin 20 and CDX-2. This immunohistochemical profile was strongly suggestive of a gynecologic primary. -Status post 3 cycles of Taxol/carboplatin chemotherapy with normalization of the CA 125, restaging CT 08/07/2012 with marked improvement in the pelvic masses and no evidence of progressive ovarian cancer. She  completed cycle 4 of Taxol/carboplatin beginning 08/08/2012. 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 She completed cycle 6 Taxol/carboplatin beginning 11/21/2012. 2. History of Atrial fibrillation  3. Status post Port-A-Cath placement 05/04/2012. Port-A-Cath removal 02/21/2013 4. 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       5. admission 09/02/2013 with gross hematuria-right pelvic mass with involvement of the bladder confirmed on CT with a cystoscopy confirming a large tumor in the bladder with associated bleeding, status post TURBT and fulguration, pathology consistent with a high-grade carcinoma. Immunohistochemical stains returned positive for cytokeratin 7 and WT-1,, and negative for cytokeratin 20 and cytokeratin 903.        6. anemia secondary to bleeding   Disposition:  Victoria [name] has been diagnosed with tumor involving the bladder after she presented with gross hematuria. The hematuria has partially improved following a fulguration procedure on 09/05/2013. I discussed the pathology findings with Victoria [name] and her son. It is unclear whether the current bladder tumor is related to ovarian cancer versus a bladder primary. She again indicated that she does not wish to consider a cystectomy procedure. I recommend 5-fluorouracil or capecitabine to be given concurrently with radiation. The goal is to diminish the bleeding and provide durable palliation of the bladder mass. We discussed the potential toxicities associated with 5-fluorouracil including the chance for mucositis, diarrhea, the hand/foot syndrome, and hyperpigmentation.  Victoria [name] does not wish to receive chemotherapy at present. She would like to complete radiation alone. She will consider salvage chemotherapy in the future as indicated.  The plan is to  proceed  with daily radiation as directed by Dr. Sondra Come. We will check a CBC when she returns on 09/17/2013. Victoria [name] will be scheduled for an office visit on 09/27/2013.   Victoria Coder, MD  09/14/2013  10:16 AM

## 2013-09-14 NOTE — Telephone Encounter (Signed)
Gave pt appt for lab and MD , February and March 2015

## 2013-09-17 ENCOUNTER — Encounter (INDEPENDENT_AMBULATORY_CARE_PROVIDER_SITE_OTHER): Payer: Self-pay

## 2013-09-17 ENCOUNTER — Ambulatory Visit
Admission: RE | Admit: 2013-09-17 | Discharge: 2013-09-17 | Disposition: A | Discharge status: Home / Self Care | Payer: Medicare Other | Source: Ambulatory Visit | Attending: Radiation Oncology | Admitting: Radiation Oncology

## 2013-09-17 ENCOUNTER — Other Ambulatory Visit (HOSPITAL_BASED_OUTPATIENT_CLINIC_OR_DEPARTMENT_OTHER): Payer: Medicare Other

## 2013-09-17 ENCOUNTER — Ambulatory Visit: Payer: Medicare Other

## 2013-09-17 DIAGNOSIS — C679 Malignant neoplasm of bladder, unspecified: Secondary | ICD-10-CM

## 2013-09-17 DIAGNOSIS — D649 Anemia, unspecified: Secondary | ICD-10-CM

## 2013-09-17 DIAGNOSIS — D500 Iron deficiency anemia secondary to blood loss (chronic): Secondary | ICD-10-CM

## 2013-09-17 DIAGNOSIS — C569 Malignant neoplasm of unspecified ovary: Secondary | ICD-10-CM

## 2013-09-17 LAB — CBC WITH DIFFERENTIAL/PLATELET
BASO%: 0.3 % (ref 0.0–2.0)
Basophils Absolute: 0 10*3/uL (ref 0.0–0.1)
EOS%: 1.5 % (ref 0.0–7.0)
Eosinophils Absolute: 0.1 10*3/uL (ref 0.0–0.5)
HCT: 33.6 % — ABNORMAL LOW (ref 34.8–46.6)
HEMOGLOBIN: 11.1 g/dL — AB (ref 11.6–15.9)
LYMPH#: 0.8 10*3/uL — AB (ref 0.9–3.3)
LYMPH%: 12.3 % — ABNORMAL LOW (ref 14.0–49.7)
MCH: 32.2 pg (ref 25.1–34.0)
MCHC: 33.1 g/dL (ref 31.5–36.0)
MCV: 97.3 fL (ref 79.5–101.0)
MONO#: 0.6 10*3/uL (ref 0.1–0.9)
MONO%: 8.1 % (ref 0.0–14.0)
NEUT#: 5.4 10*3/uL (ref 1.5–6.5)
NEUT%: 77.8 % — ABNORMAL HIGH (ref 38.4–76.8)
Platelets: 265 10*3/uL (ref 145–400)
RBC: 3.46 10*6/uL — ABNORMAL LOW (ref 3.70–5.45)
RDW: 15.5 % — ABNORMAL HIGH (ref 11.2–14.5)
WBC: 6.9 10*3/uL (ref 3.9–10.3)

## 2013-09-18 ENCOUNTER — Ambulatory Visit: Payer: Medicare Other | Admitting: Radiation Oncology

## 2013-09-18 ENCOUNTER — Ambulatory Visit: Payer: Medicare Other

## 2013-09-19 ENCOUNTER — Ambulatory Visit
Admission: RE | Admit: 2013-09-19 | Discharge: 2013-09-19 | Disposition: A | Discharge status: Home / Self Care | Payer: Medicare Other | Source: Ambulatory Visit | Attending: Radiation Oncology | Admitting: Radiation Oncology

## 2013-09-19 ENCOUNTER — Ambulatory Visit: Payer: Medicare Other

## 2013-09-19 VITALS — BP 123/62 | HR 71 | Temp 97.8°F | Ht 61.0 in | Wt 140.5 lb

## 2013-09-19 DIAGNOSIS — C569 Malignant neoplasm of unspecified ovary: Secondary | ICD-10-CM

## 2013-09-19 NOTE — Progress Notes (Signed)
[facility]     Rexene Edison, M.D. [place], [place] [address]               Blair Promise, M.D., Ph.D. Phone: [phone]      [place] A. Tammi Klippel, M.D. Fax: [phone]      Jodelle Gross, M.D., Ph.D.         Thea Silversmith, M.D.         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 The chart was checked and CBCT  were reviewed. She is tolerating the treatments well at this time without any appreciable side effects. She denies any further hematuria. Patient has elected not to proceed with radiosensitizing chemotherapy as part of her overall management.  Pindolol; Iodine; Shellfish allergy; Vibramycin; Doxycycline; Levofloxacin; Multaq; and Vicodin Current Outpatient Prescriptions  Medication Sig Dispense Refill  . diltiazem (CARDIZEM CD) 240 MG 24 hr capsule take 1 capsule by mouth once daily  90 capsule  3  . feeding supplement (ENSURE IMMUNE HEALTH) LIQD Take 237 mLs by mouth daily.       . iron polysaccharides (NIFEREX) 150 MG capsule Take 1 capsule (150 mg total) by mouth daily.  30 capsule  6  . lactose free nutrition (BOOST) LIQD Take 237 mLs by mouth daily.      Marland Kitchen acetaminophen (TYLENOL) 325 MG tablet Take 2 tablets (650 mg total) by mouth every 6 (six) hours as needed for mild pain (or Fever >/= 101).  100 tablet  12  . Cholecalciferol 2000 UNITS TABS Take 2,000 Units by mouth daily.      . simvastatin (ZOCOR) 20 MG tablet Take 20 mg by mouth every evening.       No current facility-administered medications  for this encounter.   Facility-Administered Medications Ordered in Other Encounters  Medication Dose Route Frequency Provider Last Rate Last Dose  . heparin 6,000 Units in sodium chloride irrigation 0.9 % 500 mL irrigation   Irrigation Once Rolm Bookbinder, MD      . sodium chloride 0.9 % injection 10 mL  10 mL Intracatheter PRN Ladell Pier, MD   10 mL at 07/18/12 1248   Labs:  Lab Results  Component Value Date   WBC 6.9 09/17/2013   HGB 11.1* 09/17/2013   HCT 33.6* 09/17/2013   MCV 97.3 09/17/2013   PLT 265 09/17/2013   Lab Results  Component Value Date   CREATININE 0.88 09/06/2013   BUN 7 09/06/2013   NA 134* 09/06/2013   K 3.9 09/06/2013   CL 100 09/06/2013   CO2 26 09/06/2013   Lab Results  Component Value Date   ALT 10 09/06/2013   AST 19 09/06/2013   PHOS 3.1 04/13/2012   BILITOT 0.3 09/06/2013    Physical Examination:  Filed Vitals:   09/19/13 0939  BP: 123/62  Pulse: 71  Temp: 97.8 F (36.6 C)    Wt Readings from Last 3  Encounters:  09/19/13 140 lb 8 oz (63.73 kg)  09/14/13 139 lb 9.6 oz (63.322 kg)  09/06/13 140 lb 14 oz (63.9 kg)     Lungs - Normal respiratory effort, chest expands symmetrically. Lungs are clear to auscultation, no crackles or wheezes.  Heart has regular rhythm and rate  Abdomen is soft and non tender with normal bowel sounds  Assessment:  Patient tolerating treatments well  Plan: Continue treatment per original radiation prescription.

## 2013-09-19 NOTE — Progress Notes (Signed)
Victoria [name] has had 5 fractions to her bladder.  She denies pain, dysuria and fatigue.  She does report having to urinate every 50 minutes during the night.  She said she does drink a lot of water.  She reports passing a blood clot last Thursday but has not had any hematuria since then.  She was given the Radiation Therapy and You book and discussed side effects management of fatigue, diarrhea, bladder changes and skin changes.  She was advised to contact nursing with any questions or concerns.

## 2013-09-20 ENCOUNTER — Ambulatory Visit: Payer: Medicare Other

## 2013-09-21 ENCOUNTER — Ambulatory Visit: Payer: Medicare Other

## 2013-09-21 ENCOUNTER — Ambulatory Visit
Admission: RE | Admit: 2013-09-21 | Discharge: 2013-09-21 | Disposition: A | Discharge status: Home / Self Care | Payer: Medicare Other | Source: Ambulatory Visit | Attending: Radiation Oncology | Admitting: Radiation Oncology

## 2013-09-24 ENCOUNTER — Ambulatory Visit
Admission: RE | Admit: 2013-09-24 | Discharge: 2013-09-24 | Disposition: A | Discharge status: Home / Self Care | Payer: Medicare Other | Source: Ambulatory Visit | Attending: Radiation Oncology | Admitting: Radiation Oncology

## 2013-09-25 ENCOUNTER — Ambulatory Visit
Admission: RE | Admit: 2013-09-25 | Discharge: 2013-09-25 | Disposition: A | Discharge status: Home / Self Care | Payer: Medicare Other | Source: Ambulatory Visit | Attending: Radiation Oncology | Admitting: Radiation Oncology

## 2013-09-25 ENCOUNTER — Encounter: Payer: Self-pay | Admitting: Radiation Oncology

## 2013-09-25 VITALS — BP 116/69 | HR 73 | Resp 16 | Wt 138.7 lb

## 2013-09-25 DIAGNOSIS — C569 Malignant neoplasm of unspecified ovary: Secondary | ICD-10-CM

## 2013-09-25 NOTE — Addendum Note (Signed)
Encounter addended by: Heywood Footman, RN on: 09/25/2013 12:10 PM<BR>     Documentation filed: Chief Complaint Section, Flowsheet VN, Notes Section, Vitals Section

## 2013-09-25 NOTE — Progress Notes (Signed)
Patient reports mild dysuria. Reports hematuria a week and a half ago in the amount of a tablespoon. Denies diarrhea. Reports that she has a great appetite. Weight stable.

## 2013-09-25 NOTE — Progress Notes (Signed)
  Radiation Oncology         [phone] ________________________________  Name: Victoria [name] MRN: [id number]  Date: 09/25/2013  DOB: 26-Jun-1927  Weekly Radiation Therapy Management  Current Dose: 15.6 Gy     Planned Dose:  ~60 Gy  Narrative . . . . . . . . The patient presents for routine under treatment assessment.                                   The patient is without complaint.  She did have one episode of mild hematuria approximately a week ago but none since.                                 Set-up films were reviewed.                                 The chart was checked. Physical Findings. . .  vitals were not taken for this visit.. Weight essentially stable.  No significant changes. Impression . . . . . . . The patient is tolerating radiation. Plan . . . . . . . . . . . . Continue treatment as planned.  ________________________________  -----------------------------------  Blair Promise, PhD, MD

## 2013-09-26 ENCOUNTER — Ambulatory Visit
Admission: RE | Admit: 2013-09-26 | Discharge: 2013-09-26 | Disposition: A | Discharge status: Home / Self Care | Payer: Medicare Other | Source: Ambulatory Visit | Attending: Radiation Oncology | Admitting: Radiation Oncology

## 2013-09-27 ENCOUNTER — Ambulatory Visit
Admission: RE | Admit: 2013-09-27 | Discharge: 2013-09-27 | Disposition: A | Discharge status: Home / Self Care | Payer: Medicare Other | Source: Ambulatory Visit | Attending: Radiation Oncology | Admitting: Radiation Oncology

## 2013-09-27 ENCOUNTER — Ambulatory Visit (HOSPITAL_BASED_OUTPATIENT_CLINIC_OR_DEPARTMENT_OTHER): Payer: Medicare Other | Admitting: Nurse Practitioner

## 2013-09-27 ENCOUNTER — Other Ambulatory Visit (HOSPITAL_BASED_OUTPATIENT_CLINIC_OR_DEPARTMENT_OTHER): Payer: Medicare Other

## 2013-09-27 VITALS — BP 139/65 | HR 67 | Temp 97.8°F | Resp 18 | Ht 61.0 in | Wt 138.1 lb

## 2013-09-27 DIAGNOSIS — D649 Anemia, unspecified: Secondary | ICD-10-CM

## 2013-09-27 DIAGNOSIS — C569 Malignant neoplasm of unspecified ovary: Secondary | ICD-10-CM

## 2013-09-27 DIAGNOSIS — D62 Acute posthemorrhagic anemia: Secondary | ICD-10-CM

## 2013-09-27 DIAGNOSIS — C779 Secondary and unspecified malignant neoplasm of lymph node, unspecified: Secondary | ICD-10-CM

## 2013-09-27 DIAGNOSIS — C679 Malignant neoplasm of bladder, unspecified: Secondary | ICD-10-CM

## 2013-09-27 DIAGNOSIS — C50919 Malignant neoplasm of unspecified site of unspecified female breast: Secondary | ICD-10-CM

## 2013-09-27 DIAGNOSIS — R310 Gross hematuria: Secondary | ICD-10-CM

## 2013-09-27 DIAGNOSIS — D500 Iron deficiency anemia secondary to blood loss (chronic): Secondary | ICD-10-CM

## 2013-09-27 LAB — CBC WITH DIFFERENTIAL/PLATELET
BASO%: 0.4 % (ref 0.0–2.0)
BASOS ABS: 0 10*3/uL (ref 0.0–0.1)
EOS ABS: 0.1 10*3/uL (ref 0.0–0.5)
EOS%: 1.7 % (ref 0.0–7.0)
HCT: 35.4 % (ref 34.8–46.6)
HGB: 11.6 g/dL (ref 11.6–15.9)
LYMPH%: 16.3 % (ref 14.0–49.7)
MCH: 31.6 pg (ref 25.1–34.0)
MCHC: 32.8 g/dL (ref 31.5–36.0)
MCV: 96.5 fL (ref 79.5–101.0)
MONO#: 0.5 10*3/uL (ref 0.1–0.9)
MONO%: 9.8 % (ref 0.0–14.0)
NEUT%: 71.8 % (ref 38.4–76.8)
NEUTROS ABS: 3.4 10*3/uL (ref 1.5–6.5)
Platelets: 177 10*3/uL (ref 145–400)
RBC: 3.67 10*6/uL — AB (ref 3.70–5.45)
RDW: 15.1 % — ABNORMAL HIGH (ref 11.2–14.5)
WBC: 4.8 10*3/uL (ref 3.9–10.3)
lymph#: 0.8 10*3/uL — ABNORMAL LOW (ref 0.9–3.3)

## 2013-09-27 LAB — CA 125: CA 125: 10 U/mL (ref 0.0–30.2)

## 2013-09-27 NOTE — Progress Notes (Signed)
OFFICE PROGRESS NOTE  Interval history:  Victoria [name] returns as scheduled. She continues radiation. She had a few episodes of hematuria on 09/22/2013 and 09/23/2013. She has had no bleeding since. She reports continued urinary frequency. She otherwise feels well. She has a good appetite. She denies pain. Ostomy is functioning normally.   Objective: Filed Vitals:   09/27/13 1225  BP: 139/65  Pulse: 67  Temp: 97.8 F (36.6 C)  Resp: 18   No thrush or ulcerations. Breath sounds diminished at the right lung base. Regular cardiac rhythm. Abdomen soft and nontender. Left lower quadrant colostomy. No hepatomegaly. No leg edema. Calves nontender.   Lab Results: Lab Results  Component Value Date   WBC 4.8 09/27/2013   HGB 11.6 09/27/2013   HCT 35.4 09/27/2013   MCV 96.5 09/27/2013   PLT 177 09/27/2013   NEUTROABS 3.4 09/27/2013    Chemistry:    Chemistry      Component Value Date/Time   NA 134* 09/06/2013 0355   NA 135* 11/21/2012 0920   K 3.9 09/06/2013 0355   K 4.3 11/21/2012 0920   CL 100 09/06/2013 0355   CL 103 11/21/2012 0920   CO2 26 09/06/2013 0355   CO2 25 11/21/2012 0920   BUN 7 09/06/2013 0355   BUN 13.3 11/21/2012 0920   CREATININE 0.88 09/06/2013 0355   CREATININE 0.8 11/21/2012 0920      Component Value Date/Time   CALCIUM 8.2* 09/06/2013 0355   CALCIUM 8.7 11/21/2012 0920   ALKPHOS 76 09/06/2013 0355   ALKPHOS 89 10/24/2012 0840   AST 19 09/06/2013 0355   AST 17 10/24/2012 0840   ALT 10 09/06/2013 0355   ALT 8 10/24/2012 0840   BILITOT 0.3 09/06/2013 0355   BILITOT 0.29 10/24/2012 0840       Studies/Results: X-ray Chest Pa And Lateral   09/03/2013   CLINICAL DATA:  Preop for pelvic mass.  EXAM: CHEST  2 VIEW  COMPARISON:  09/28/2012.  FINDINGS: The power port is been removed. The heart is in large but stable. There is tortuosity and calcification of the thoracic aorta. Chronic bronchitic and emphysematous lung changes but no definite acute pulmonary findings. No pleural effusion. Pacer  wires are stable.  IMPRESSION: Stable mild cardiac enlargement and chronic lung changes but no acute pulmonary findings.   Electronically Signed   By: Kalman Jewels M.D.   On: 09/03/2013 07:29    Medications: I have reviewed the patient's current medications.  Assessment/Plan: 1. Ovarian cancer-presenting with a rectal mass , status post sigmoidoscopy 03/13/2012 with findings of a 5 cm malignant appearing mass with central ulceration in the rectum 2-3 cm proximal to the anal verge. Pathology showed moderate to poorly differentiated adenocarcinoma with ulceration and prolapse changes. By immunohistochemistry the malignant cells were positive for cytokeratin 7, estrogen receptor and WT-1. They were negative for cytokeratin 20 and CDX-2. This immunohistochemical profile was strongly suggestive of a gynecologic primary. -Status post 3 cycles of Taxol/carboplatin chemotherapy with normalization of the CA 125, restaging CT 08/07/2012 with marked improvement in the pelvic masses and no evidence of progressive ovarian cancer. She completed cycle 4 of Taxol/carboplatin beginning 08/08/2012. 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 She completed cycle 6 Taxol/carboplatin beginning 11/21/2012. 2. History of Atrial fibrillation  3. Status post Port-A-Cath placement 05/04/2012. Port-A-Cath removal 02/21/2013 4. 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  5. Admission 09/02/2013 with gross hematuria-right pelvic mass with involvement of the bladder confirmed on CT with a cystoscopy confirming a large tumor in the bladder with associated bleeding, status post TURBT and fulguration, pathology consistent with a high-grade carcinoma. Immunohistochemical stains returned positive for cytokeratin 7 and WT-1, and negative for cytokeratin 20 and cytokeratin  903.  6. Anemia secondary to bleeding. Improved. She continues iron.  Dispositon-she appears stable. Hemoglobin is better. She will complete the course of palliative radiation on 10/26/2013.  She has a scheduled followup visit with Dr. Benay Spice on 10/23/2013. We will repeat a CBC at the time of that visit. She knows she can contact the office prior to that visit with any problems.  Plan reviewed with Dr. Benay Spice.   Ned Card ANP/GNP-BC

## 2013-09-28 ENCOUNTER — Ambulatory Visit
Admission: RE | Admit: 2013-09-28 | Discharge: 2013-09-28 | Disposition: A | Discharge status: Home / Self Care | Payer: Medicare Other | Source: Ambulatory Visit | Attending: Radiation Oncology | Admitting: Radiation Oncology

## 2013-10-01 ENCOUNTER — Ambulatory Visit
Admission: RE | Admit: 2013-10-01 | Discharge: 2013-10-01 | Disposition: A | Discharge status: Home / Self Care | Payer: Medicare Other | Source: Ambulatory Visit | Attending: Radiation Oncology | Admitting: Radiation Oncology

## 2013-10-02 ENCOUNTER — Ambulatory Visit: Payer: Medicare Other | Admitting: Radiation Oncology

## 2013-10-02 ENCOUNTER — Ambulatory Visit: Admission: RE | Admit: 2013-10-02 | Payer: Medicare Other | Source: Ambulatory Visit

## 2013-10-03 ENCOUNTER — Ambulatory Visit
Admission: RE | Admit: 2013-10-03 | Discharge: 2013-10-03 | Disposition: A | Discharge status: Home / Self Care | Payer: Medicare Other | Source: Ambulatory Visit | Attending: Radiation Oncology | Admitting: Radiation Oncology

## 2013-10-04 ENCOUNTER — Ambulatory Visit
Admission: RE | Admit: 2013-10-04 | Discharge: 2013-10-04 | Disposition: A | Discharge status: Home / Self Care | Payer: Medicare Other | Source: Ambulatory Visit | Attending: Radiation Oncology | Admitting: Radiation Oncology

## 2013-10-04 VITALS — BP 126/79 | HR 70 | Temp 97.9°F | Ht 61.0 in | Wt 139.1 lb

## 2013-10-04 DIAGNOSIS — C569 Malignant neoplasm of unspecified ovary: Secondary | ICD-10-CM

## 2013-10-04 NOTE — Progress Notes (Signed)
Victoria [name] has had 14 fractions to her bladder.  She denies pain.  She reports urinary frequency and has had to go every 15 minutes today.  She reports getting up every 45 minutes during the night.  She had a spot of blood in her urine on Sunday.  She said it comes after she urinates.  She reports buring with urination.  She denies diarrhea and has a colostomy bag.  She denies nausea.  She does report fatigue.  She denies skin irritation in the treatment area.

## 2013-10-04 NOTE — Progress Notes (Signed)
[place]     Rexene Edison, M.D. [place], [place] [address]               Blair Promise, M.D., Ph.D. Phone: [phone]      [place] A. Tammi Klippel, M.D. Fax: [phone]      Jodelle Gross, M.D., Ph.D.         Thea Silversmith, M.D.         Wyvonnia Lora, M.D Weekly Treatment Management Note  Name: Victoria [name]     MRN: [id number]        CSN: [id number] Date: 10/04/2013      DOB: April 11, 1927  CC: Donnajean Lopes, MD         Philip Aspen    Status: Outpatient  Diagnosis: Bladder cancer  Current Dose: 27 gy  Current Fraction: 14  Planned Dose: 67 GY  Narrative: Cereniti H Musso was seen today for weekly treatment management. The chart was checked and CBCT  were reviewed. She is tolerating the treatments well at this time. She denies any significant hematuria. She does have some dysuria and I discussed consideration for a pyridium but at this time the patient feels comfortable with no medication. She has been taking raisins  at bedtime which helps her to reduce her urinary frequency at night. With raisins her frequency is every 2 hours without raisins every 45 minutes.  Pindolol; Iodine; Shellfish allergy; Vibramycin; Doxycycline; Levofloxacin; Multaq; and Vicodin Current Outpatient Prescriptions  Medication Sig Dispense Refill  . diltiazem (CARDIZEM CD) 240 MG 24 hr capsule take 1 capsule by mouth once daily  90 capsule  3  . feeding supplement (ENSURE IMMUNE HEALTH) LIQD Take 237 mLs by mouth daily.       . iron polysaccharides (NIFEREX) 150 MG capsule Take 1 capsule (150 mg total) by mouth daily.  30 capsule  6  . lactose free nutrition (BOOST) LIQD Take 237 mLs by mouth daily.       No current facility-administered medications for this encounter.   Facility-Administered Medications Ordered in Other Encounters  Medication Dose Route Frequency Provider Last Rate Last Dose  . heparin 6,000 Units in sodium chloride  irrigation 0.9 % 500 mL irrigation   Irrigation Once Rolm Bookbinder, MD      . sodium chloride 0.9 % injection 10 mL  10 mL Intracatheter PRN Ladell Pier, MD   10 mL at 07/18/12 1248   Labs:  Lab Results  Component Value Date   WBC 4.8 09/27/2013   HGB 11.6 09/27/2013   HCT 35.4 09/27/2013   MCV 96.5 09/27/2013   PLT 177 09/27/2013   Lab Results  Component Value Date   CREATININE 0.88 09/06/2013   BUN 7 09/06/2013   NA 134* 09/06/2013   K 3.9 09/06/2013   CL 100 09/06/2013   CO2 26 09/06/2013   Lab Results  Component Value Date   ALT 10 09/06/2013   AST 19 09/06/2013   PHOS 3.1 04/13/2012   BILITOT 0.3 09/06/2013    Physical Examination:  Filed Vitals:   10/04/13 1150  BP: 126/79  Pulse: 70  Temp: 97.9 F (36.6 C)    Wt Readings from Last 3 Encounters:  10/04/13 139 lb 1.6 oz (63.095 kg)  09/27/13 138 lb 1.6 oz (62.642 kg)  09/25/13 138 lb 11.2 oz (62.914 kg)     Lungs - Normal respiratory effort, chest expands symmetrically. Lungs are clear to auscultation,  no crackles or wheezes.  Heart has regular rhythm and rate  Abdomen is soft and non tender with normal bowel sounds  Assessment:  Patient tolerating treatments well except for above issues  Plan: Continue treatment per original radiation prescription. She will undergo a repeat treatment planning CT scan next week to see if she would be a candidate for additional radiation therapy past 48 gray.

## 2013-10-05 ENCOUNTER — Ambulatory Visit
Admission: RE | Admit: 2013-10-05 | Discharge: 2013-10-05 | Disposition: A | Discharge status: Home / Self Care | Payer: Medicare Other | Source: Ambulatory Visit | Attending: Radiation Oncology | Admitting: Radiation Oncology

## 2013-10-05 ENCOUNTER — Ambulatory Visit: Payer: Self-pay | Admitting: Pharmacist Clinician (PhC)/ Clinical Pharmacy Specialist

## 2013-10-05 DIAGNOSIS — Z7901 Long term (current) use of anticoagulants: Secondary | ICD-10-CM

## 2013-10-05 DIAGNOSIS — I480 Paroxysmal atrial fibrillation: Secondary | ICD-10-CM

## 2013-10-08 ENCOUNTER — Ambulatory Visit
Admission: RE | Admit: 2013-10-08 | Discharge: 2013-10-08 | Disposition: A | Discharge status: Home / Self Care | Payer: Medicare Other | Source: Ambulatory Visit | Attending: Radiation Oncology | Admitting: Radiation Oncology

## 2013-10-09 ENCOUNTER — Ambulatory Visit
Admission: RE | Admit: 2013-10-09 | Discharge: 2013-10-09 | Disposition: A | Discharge status: Home / Self Care | Payer: Medicare Other | Source: Ambulatory Visit | Attending: Radiation Oncology | Admitting: Radiation Oncology

## 2013-10-09 ENCOUNTER — Encounter: Payer: Self-pay | Admitting: Radiation Oncology

## 2013-10-09 VITALS — BP 129/73 | HR 69 | Temp 97.7°F | Resp 20 | Wt 139.0 lb

## 2013-10-09 DIAGNOSIS — C569 Malignant neoplasm of unspecified ovary: Secondary | ICD-10-CM

## 2013-10-09 LAB — URINALYSIS, MICROSCOPIC - CHCC
Bilirubin (Urine): NEGATIVE
GLUCOSE UR CHCC: NEGATIVE mg/dL
Ketones: 15 mg/dL
NITRITE: NEGATIVE
PH: 6 (ref 4.6–8.0)
Protein: 100 mg/dL
Specific Gravity, Urine: 1.03 (ref 1.003–1.035)
UROBILINOGEN UR: 0.2 mg/dL (ref 0.2–1)

## 2013-10-09 MED ORDER — PHENAZOPYRIDINE HCL 200 MG PO TABS: 200.0000 mg | ORAL_TABLET | Freq: Three times a day (TID) | ORAL | Status: DC | PRN

## 2013-10-09 NOTE — Progress Notes (Signed)
  Radiation Oncology         [phone] ________________________________  Name: Victoria [name] MRN: [id number]  Date: 10/09/2013  DOB: [birth year]  Weekly Radiation Therapy Management  Current Dose: 31.8 Gy     Planned Dose:  ~55 Gy  Narrative . . . . . . . . The patient presents for routine under treatment assessment.                                   The patient is without complaint except for increasing urinary symptoms. She has dysuria as well as increased frequency. She is having to urinate every hour and a half at night. She denies any diarrhea or hematuria. Patient was taking raisins which initially helped her symptoms but not at this time.                                 Set-up films were reviewed.                                 The chart was checked. Physical Findings. . .  weight is 139 lb (63.05 kg). Her oral temperature is 97.7 F (36.5 C). Her blood pressure is 129/73 and her pulse is 69. Her respiration is 20. . Weight essentially stable.  No significant changes. Impression . . . . . . . The patient is tolerating radiation. Plan . . . . . . . . . . . . Continue treatment as planned. The patient will present to the lab for a urinalysis culture and sensitivity to rule out infection. I also place her on Pyridium or equivalent medication for her symptoms. Patient did undergo repeat  treatment planning CT scan of the pelvis today to assess for any potential bladder mass shrinkage and to plan for her boost field.  ________________________________  -----------------------------------  Blair Promise, PhD, MD

## 2013-10-09 NOTE — Progress Notes (Addendum)
Weekly rad txs bladder 17 tx complete, patient still having dysuria, freqency, every 1-1.4 hours last night, raisons not working now Friday night I had to go every 15 minutes, no sleep Friday  Night, last night she  slept between 1.5, hours, then 1.4 hours, then 1.3 hours, no diarrhea, hematuria, no nausea no pain, , wants to discusss CT scan from [facility] Daughter in with patient 11:12 AM t

## 2013-10-10 ENCOUNTER — Ambulatory Visit
Admission: RE | Admit: 2013-10-10 | Discharge: 2013-10-10 | Disposition: A | Discharge status: Home / Self Care | Payer: Medicare Other | Source: Ambulatory Visit | Attending: Radiation Oncology | Admitting: Radiation Oncology

## 2013-10-10 LAB — URINE CULTURE

## 2013-10-11 ENCOUNTER — Telehealth: Payer: Self-pay | Admitting: Oncology

## 2013-10-11 ENCOUNTER — Ambulatory Visit
Admission: RE | Admit: 2013-10-11 | Discharge: 2013-10-11 | Disposition: A | Discharge status: Home / Self Care | Payer: Medicare Other | Source: Ambulatory Visit | Attending: Radiation Oncology | Admitting: Radiation Oncology

## 2013-10-11 NOTE — Telephone Encounter (Signed)
Called Peggy and let her know that her urine culture showed now signs of infection per Dr. Sondra Come.

## 2013-10-12 ENCOUNTER — Ambulatory Visit
Admission: RE | Admit: 2013-10-12 | Discharge: 2013-10-12 | Disposition: A | Discharge status: Home / Self Care | Payer: Medicare Other | Source: Ambulatory Visit | Attending: Radiation Oncology | Admitting: Radiation Oncology

## 2013-10-15 ENCOUNTER — Ambulatory Visit: Payer: Medicare Other | Admitting: Pharmacist Clinician (PhC)/ Clinical Pharmacy Specialist

## 2013-10-15 ENCOUNTER — Ambulatory Visit
Admission: RE | Admit: 2013-10-15 | Discharge: 2013-10-15 | Disposition: A | Discharge status: Home / Self Care | Payer: Medicare Other | Source: Ambulatory Visit | Attending: Radiation Oncology | Admitting: Radiation Oncology

## 2013-10-16 ENCOUNTER — Ambulatory Visit (INDEPENDENT_AMBULATORY_CARE_PROVIDER_SITE_OTHER): Payer: 59

## 2013-10-16 ENCOUNTER — Ambulatory Visit
Admission: RE | Admit: 2013-10-16 | Discharge: 2013-10-16 | Disposition: A | Discharge status: Home / Self Care | Payer: Medicare Other | Source: Ambulatory Visit | Attending: Radiation Oncology | Admitting: Radiation Oncology

## 2013-10-16 ENCOUNTER — Ambulatory Visit: Payer: Medicare Other | Admitting: Radiation Oncology

## 2013-10-16 ENCOUNTER — Encounter: Payer: Self-pay | Admitting: Cardiovascular Disease

## 2013-10-16 DIAGNOSIS — I4891 Unspecified atrial fibrillation: Secondary | ICD-10-CM

## 2013-10-16 DIAGNOSIS — I480 Paroxysmal atrial fibrillation: Secondary | ICD-10-CM

## 2013-10-16 DIAGNOSIS — I471 Supraventricular tachycardia: Secondary | ICD-10-CM

## 2013-10-16 LAB — MDC_IDC_ENUM_SESS_TYPE_INCLINIC
Battery Impedance: 1400 Ohm
Battery Voltage: 2.79 V
Lead Channel Impedance Value: 376 Ohm
Lead Channel Impedance Value: 402 Ohm
Lead Channel Pacing Threshold Pulse Width: 0.4 ms
Lead Channel Sensing Intrinsic Amplitude: 1.5 mV
Lead Channel Setting Pacing Amplitude: 2 V
Lead Channel Setting Pacing Amplitude: 2.5 V
Lead Channel Setting Pacing Pulse Width: 0.4 ms
Lead Channel Setting Sensing Sensitivity: 1.5 mV
MDC IDC MSMT LEADCHNL RA PACING THRESHOLD AMPLITUDE: 0.75 V
MDC IDC MSMT LEADCHNL RA PACING THRESHOLD PULSEWIDTH: 0.4 ms
MDC IDC MSMT LEADCHNL RV PACING THRESHOLD AMPLITUDE: 1.25 V
MDC IDC MSMT LEADCHNL RV SENSING INTR AMPL: 5 mV
MDC IDC PG SERIAL: 2195755
MDC IDC SESS DTM: 20150324142558
MDC IDC STAT BRADY RA PERCENT PACED: 55 %
MDC IDC STAT BRADY RV PERCENT PACED: 1 % — AB

## 2013-10-17 ENCOUNTER — Ambulatory Visit
Admission: RE | Admit: 2013-10-17 | Discharge: 2013-10-17 | Disposition: A | Discharge status: Home / Self Care | Payer: Medicare Other | Source: Ambulatory Visit | Attending: Radiation Oncology | Admitting: Radiation Oncology

## 2013-10-18 ENCOUNTER — Ambulatory Visit
Admission: RE | Admit: 2013-10-18 | Discharge: 2013-10-18 | Disposition: A | Discharge status: Home / Self Care | Payer: Medicare Other | Source: Ambulatory Visit | Attending: Radiation Oncology | Admitting: Radiation Oncology

## 2013-10-18 VITALS — BP 115/65 | HR 64 | Temp 97.9°F | Ht 61.0 in | Wt 140.2 lb

## 2013-10-18 DIAGNOSIS — C569 Malignant neoplasm of unspecified ovary: Secondary | ICD-10-CM

## 2013-10-18 NOTE — Progress Notes (Signed)
  Radiation Oncology         [phone] ________________________________  Name: Victoria [name] MRN: [id number]  Date: 10/18/2013  DOB: 07/16/1927  Weekly Radiation Therapy Management  Current Dose: 45 Gy     Planned Dose:  48 Gy  Narrative . . . . . . . . The patient presents for routine under treatment assessment.                                   The patient is without complaint except for intense burning with urination. She was given Pyridium last week which did not help this issue. She denies any hematuria or clots. She denies any pain except that associated with urination.                                 Set-up films were reviewed.                                 The chart was checked. Physical Findings. . .  height is 5\' 1"  (1.549 m) and weight is 140 lb 3.2 oz (63.594 kg). Her temperature is 97.9 F (36.6 C). Her blood pressure is 115/65 and her pulse is 64. . Weight essentially stable.  Examination of the pelvis region reveals mild erythema to the skin of the suprapubic area. Examination of the vulvar and lower vaginal area as well as urethra reveals no significant inflammation or signs of infection. Impression . . . . . . . The patient is tolerating radiation. No more recent hematuria or clots. Plan . . . . . . . . . . . . Continue treatment as planned.  ________________________________   Blair Promise, PhD, MD

## 2013-10-18 NOTE — Progress Notes (Signed)
Victoria [name] has had 23 fractions to her bladder.  She denies pain now but reports severe pain with urination in her bladder area.  She reports the pain started last week and she has started taking pyridium which she thinks it is making it worse.  She reports that she is urinating every 20 minutes day and night.  Her skin on her lower abdomen/pelvic area is pink.  She denies skin changes in her vaginal/rectal area.  She reports that the "skin inside feels raw and irritated." She denies hematuria except for some pale pink after she wiped this morning.  She denies nausea.  She reports fatigue.

## 2013-10-19 ENCOUNTER — Ambulatory Visit
Admission: RE | Admit: 2013-10-19 | Discharge: 2013-10-19 | Disposition: A | Discharge status: Home / Self Care | Payer: Medicare Other | Source: Ambulatory Visit | Attending: Radiation Oncology | Admitting: Radiation Oncology

## 2013-10-22 ENCOUNTER — Ambulatory Visit
Admission: RE | Admit: 2013-10-22 | Discharge: 2013-10-22 | Disposition: A | Discharge status: Home / Self Care | Payer: Medicare Other | Source: Ambulatory Visit | Attending: Radiation Oncology | Admitting: Radiation Oncology

## 2013-10-22 VITALS — BP 124/79 | HR 69 | Temp 97.4°F | Ht 61.0 in | Wt 139.7 lb

## 2013-10-22 DIAGNOSIS — C569 Malignant neoplasm of unspecified ovary: Secondary | ICD-10-CM

## 2013-10-22 NOTE — Progress Notes (Signed)
Victoria [name] completed treatment today with 26 fractions to her bladder.  She is having severe pain when she urinates.  She said it feels like "knives."  She continues to urinated every 20 minutes.  She reports seeing pink on the toilet tissue this morning.  She denies diarrhea.  She reports fatigue.  She reports that her skin in her groin/vaginal/rectal area is intact.

## 2013-10-22 NOTE — Progress Notes (Signed)
Pacemaker check in clinic. Normal device function. Thresholds, sensing, impedances consistent with previous measurements. Device programmed to maximize longevity. 13 mode switches (<1%)----max dur. 8 hours, Max A 197, Last 3/13----No EGMs-Warfarin D/C'd due to bleed related to bladder CA. No high ventricular rates noted. Device programmed at appropriate safety margins. Histogram distribution appropriate for patient activity level. Device programmed to optimize intrinsic conduction. Estimated longevity 4-6.25 years. Patient will follow up with [facility] in 6 months.

## 2013-10-22 NOTE — Progress Notes (Signed)
  Radiation Oncology         [phone] ________________________________  Name: Victoria [name] MRN: [id number]  Date: 10/22/2013  DOB: Oct 08, 1926  Weekly Radiation Therapy Management  Current Dose: 48 Gy     Planned Dose:  48 Gy  Narrative . . . . . . . . The patient presents for routine under treatment assessment.                                   The patient is without complaint except for an intense burning with urination. She is comfortable otherwise. She denies any hematuria.                                 Set-up films were reviewed.                                 The chart was checked. Physical Findings. . .  height is 5\' 1"  (1.549 m) and weight is 139 lb 11.2 oz (63.368 kg). Her temperature is 97.4 F (36.3 C). Her blood pressure is 124/79 and her pulse is 69. . Weight essentially stable.  No significant changes. Impression . . . . . . . The patient is tolerating radiation. Plan . . . . . . . . . . . Marland Kitchen routine followup in one month.  ________________________________   Blair Promise, PhD, MD

## 2013-10-23 ENCOUNTER — Other Ambulatory Visit (HOSPITAL_BASED_OUTPATIENT_CLINIC_OR_DEPARTMENT_OTHER): Payer: 59

## 2013-10-23 ENCOUNTER — Telehealth: Payer: Self-pay | Admitting: Oncology

## 2013-10-23 ENCOUNTER — Ambulatory Visit (HOSPITAL_BASED_OUTPATIENT_CLINIC_OR_DEPARTMENT_OTHER): Payer: 59 | Admitting: Nurse Practitioner

## 2013-10-23 ENCOUNTER — Ambulatory Visit: Payer: Medicare Other

## 2013-10-23 VITALS — BP 135/73 | HR 70 | Temp 97.0°F | Resp 18 | Ht 61.0 in | Wt 138.8 lb

## 2013-10-23 DIAGNOSIS — C569 Malignant neoplasm of unspecified ovary: Secondary | ICD-10-CM

## 2013-10-23 DIAGNOSIS — D62 Acute posthemorrhagic anemia: Secondary | ICD-10-CM

## 2013-10-23 DIAGNOSIS — R310 Gross hematuria: Secondary | ICD-10-CM

## 2013-10-23 DIAGNOSIS — D500 Iron deficiency anemia secondary to blood loss (chronic): Secondary | ICD-10-CM

## 2013-10-23 DIAGNOSIS — C785 Secondary malignant neoplasm of large intestine and rectum: Secondary | ICD-10-CM

## 2013-10-23 DIAGNOSIS — C679 Malignant neoplasm of bladder, unspecified: Secondary | ICD-10-CM

## 2013-10-23 LAB — CBC WITH DIFFERENTIAL/PLATELET
BASO%: 0.2 % (ref 0.0–2.0)
BASOS ABS: 0 10*3/uL (ref 0.0–0.1)
EOS%: 1.4 % (ref 0.0–7.0)
Eosinophils Absolute: 0.1 10*3/uL (ref 0.0–0.5)
HCT: 37.2 % (ref 34.8–46.6)
HEMOGLOBIN: 12.3 g/dL (ref 11.6–15.9)
LYMPH#: 0.3 10*3/uL — AB (ref 0.9–3.3)
LYMPH%: 6.2 % — ABNORMAL LOW (ref 14.0–49.7)
MCH: 32 pg (ref 25.1–34.0)
MCHC: 33.1 g/dL (ref 31.5–36.0)
MCV: 96.8 fL (ref 79.5–101.0)
MONO#: 0.5 10*3/uL (ref 0.1–0.9)
MONO%: 10 % (ref 0.0–14.0)
NEUT%: 82.2 % — ABNORMAL HIGH (ref 38.4–76.8)
NEUTROS ABS: 4.2 10*3/uL (ref 1.5–6.5)
Platelets: 216 10*3/uL (ref 145–400)
RBC: 3.84 10*6/uL (ref 3.70–5.45)
RDW: 14.4 % (ref 11.2–14.5)
WBC: 5.1 10*3/uL (ref 3.9–10.3)

## 2013-10-23 NOTE — Progress Notes (Signed)
[facility] OFFICE PROGRESS NOTE   INTERVAL HISTORY:   She returns as scheduled. She completed the course of radiation on 10/22/2013. She has noted a "pink tinge" on the toilet tissue intermittently over the past 2 days. She continues to urinate frequently. She has had significant pain with urination over the past 2 weeks. Yesterday she began experiencing a rectal discharge. He discharge is nonbloody and seems to be decreasing. She has a good appetite.  Objective:  Vital signs in last 24 hours:  Blood pressure 135/73, pulse 70, temperature 97 F (36.1 C), temperature source Oral, resp. rate 18, height 5\' 1"  (1.549 m), weight 138 lb 12.8 oz (62.959 kg), SpO2 98.00%.    HEENT: No thrush or ulcerations. Lymphatics: No palpable cervical, supraclavicular, axillary or inguinal lymph nodes. Resp: Lungs are clear. Breath sounds diminished at the right base. Cardio: Regular cardiac rhythm. GI: Abdomen soft and nontender. No hepatomegaly. No mass. Left lower quadrant colostomy. Vascular: No leg edema. Rectal: No mass.   Lab Results:  Lab Results  Component Value Date   WBC 5.1 10/23/2013   HGB 12.3 10/23/2013   HCT 37.2 10/23/2013   MCV 96.8 10/23/2013   PLT 216 10/23/2013   NEUTROABS 4.2 10/23/2013    Lab Results  Component Value Date   NA 134* 09/06/2013    Lab Results  Component Value Date   CEA 3.0 03/29/2012    Imaging:  No results found.  Medications: I have reviewed the patient's current medications.  Assessment/Plan: 1. Ovarian cancer-presenting with a rectal mass , status post sigmoidoscopy 03/13/2012 with findings of a 5 cm malignant appearing mass with central ulceration in the rectum 2-3 cm proximal to the anal verge. Pathology showed moderate to poorly differentiated adenocarcinoma with ulceration and prolapse changes. By immunohistochemistry the malignant cells were positive for cytokeratin 7, estrogen receptor and WT-1. They were negative for  cytokeratin 20 and CDX-2. This immunohistochemical profile was strongly suggestive of a gynecologic primary. -Status post 3 cycles of Taxol/carboplatin chemotherapy with normalization of the CA 125, restaging CT 08/07/2012 with marked improvement in the pelvic masses and no evidence of progressive ovarian cancer. She completed cycle 4 of Taxol/carboplatin beginning 08/08/2012. 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 She completed cycle 6 Taxol/carboplatin beginning 11/21/2012. 2. History of Atrial fibrillation  3. Status post Port-A-Cath placement 05/04/2012. Port-A-Cath removal 02/21/2013 4. 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 5. Admission 09/02/2013 with gross hematuria-right pelvic mass with involvement of the bladder confirmed on CT with a cystoscopy confirming a large tumor in the bladder with associated bleeding, status post TURBT and fulguration, pathology consistent with a high-grade carcinoma. Immunohistochemical stains returned positive for cytokeratin 7 and WT-1, and negative for cytokeratin 20 and cytokeratin 903. She completed a course of palliative radiation 09/13/2013 through 10/22/2013. 6. Anemia secondary to bleeding. Improved. She continues iron. 7. Dysuria and frequent urination. Likely secondary to radiation. 8. Rectal discharge. Question related to radiation.   Disposition: She appears stable. She has completed the course of radiation. She has had no further significant bleeding and the hemoglobin has corrected into normal range.  She will return for labs and a followup visit in 2 months. She will contact the office in the interim with any problems.   Plan reviewed with Dr. Benay Spice.    Ned Card ANP/GNP-BC   10/23/2013  10:58 AM

## 2013-10-23 NOTE — Telephone Encounter (Signed)
[facility] APPT SCHEDULE FOR MAY

## 2013-10-24 ENCOUNTER — Ambulatory Visit: Payer: Medicare Other

## 2013-10-25 ENCOUNTER — Ambulatory Visit: Payer: Medicare Other

## 2013-10-26 ENCOUNTER — Telehealth: Payer: Self-pay | Admitting: *Deleted

## 2013-10-26 ENCOUNTER — Ambulatory Visit: Payer: Medicare Other

## 2013-10-26 DIAGNOSIS — C20 Malignant neoplasm of rectum: Secondary | ICD-10-CM

## 2013-10-26 NOTE — Telephone Encounter (Signed)
Reports persistent bladder pain over past two weeks-Ibuprofen and Tylenol not very effective. Has some tramadol 50 mg on hand and asks if OK to try this? Approved her to use her tramadol 50 mg-use maximum of 4/day and call Monday if not effective.

## 2013-10-29 ENCOUNTER — Ambulatory Visit: Payer: Medicare Other

## 2013-10-31 ENCOUNTER — Encounter: Payer: Self-pay | Admitting: Radiation Oncology

## 2013-10-31 NOTE — Progress Notes (Signed)
  Radiation Oncology         [phone] ________________________________  Name: Victoria [name] MRN: [id number]  Date: 10/31/2013  DOB: [birth year]  End of Treatment Note  Diagnosis:      Bladder, transurethral resection - HIGH GRADE, POORLY DIFFERENTIATED CARCINOMA. - SEE MICROSCOPIC DESCRIPTION. Microscopic Comment The specimen is extensively involved by high grade, poorly differentiated carcinoma and the morphologic feature are consistent with high grade poorly differentiated urothelial carcinoma. There is a small amount of stroma present which focally shows desmoplasia suspicious for invasion. Muscularis propria tissue is not identified. Called to Dr. Lyndal Rainbow office on 09/06/2013. (JDP:ecj/kh 09/06/2013) ADDENDUM: Immunohistochemistry is performed and the tumor is positive with cytokeratin 7 and WT-1. The tumor is negative with cytokeratin 20 and cytokeratin 903. The presence of WT-1 positivity suggests that the differential diagnosis includes recurrent ovarian carcinoma.  Indication for treatment:  Significant gross hematuria and local regional control   Radiation treatment dates:   February 12 through March 30  Site/dose:   Bladder, 48 gray in 26 fractions  Beams/energy:   3-D conformal  Narrative: The patient tolerated radiation treatment relatively well.  Towards the end of her therapy however she experienced significant dysuria which did not respond well to medication. There was no evidence of a urinary tract infection  Plan: The patient has completed radiation treatment. The patient will return to radiation oncology clinic for routine followup in one month. I advised them to call or return sooner if they have any questions or concerns related to their recovery or treatment.  -----------------------------------  Blair Promise, PhD, MD

## 2013-11-16 ENCOUNTER — Encounter: Payer: Self-pay | Admitting: *Deleted

## 2013-11-22 ENCOUNTER — Ambulatory Visit: Payer: Self-pay | Admitting: Radiation Oncology

## 2013-11-22 ENCOUNTER — Ambulatory Visit: Payer: 59 | Admitting: Radiation Oncology

## 2013-12-13 ENCOUNTER — Other Ambulatory Visit: Payer: Self-pay | Admitting: Dermatology

## 2013-12-18 ENCOUNTER — Telehealth: Payer: Self-pay | Admitting: Oncology

## 2013-12-18 ENCOUNTER — Ambulatory Visit (HOSPITAL_BASED_OUTPATIENT_CLINIC_OR_DEPARTMENT_OTHER): Payer: 59 | Admitting: Oncology

## 2013-12-18 ENCOUNTER — Ambulatory Visit (HOSPITAL_BASED_OUTPATIENT_CLINIC_OR_DEPARTMENT_OTHER): Payer: Medicare Other

## 2013-12-18 ENCOUNTER — Other Ambulatory Visit (HOSPITAL_BASED_OUTPATIENT_CLINIC_OR_DEPARTMENT_OTHER): Payer: 59

## 2013-12-18 VITALS — BP 121/55 | HR 64 | Temp 97.6°F | Resp 18 | Ht 61.0 in | Wt 143.3 lb

## 2013-12-18 DIAGNOSIS — D500 Iron deficiency anemia secondary to blood loss (chronic): Secondary | ICD-10-CM

## 2013-12-18 DIAGNOSIS — C569 Malignant neoplasm of unspecified ovary: Secondary | ICD-10-CM

## 2013-12-18 DIAGNOSIS — R300 Dysuria: Secondary | ICD-10-CM

## 2013-12-18 DIAGNOSIS — I4891 Unspecified atrial fibrillation: Secondary | ICD-10-CM

## 2013-12-18 DIAGNOSIS — R350 Frequency of micturition: Secondary | ICD-10-CM

## 2013-12-18 LAB — CBC WITH DIFFERENTIAL/PLATELET
BASO%: 0.6 % (ref 0.0–2.0)
Basophils Absolute: 0 10*3/uL (ref 0.0–0.1)
EOS ABS: 0.1 10*3/uL (ref 0.0–0.5)
EOS%: 2.6 % (ref 0.0–7.0)
HCT: 36.6 % (ref 34.8–46.6)
HEMOGLOBIN: 12.1 g/dL (ref 11.6–15.9)
LYMPH#: 1 10*3/uL (ref 0.9–3.3)
LYMPH%: 20.4 % (ref 14.0–49.7)
MCH: 31.5 pg (ref 25.1–34.0)
MCHC: 33 g/dL (ref 31.5–36.0)
MCV: 95.6 fL (ref 79.5–101.0)
MONO#: 0.5 10*3/uL (ref 0.1–0.9)
MONO%: 9.5 % (ref 0.0–14.0)
NEUT#: 3.3 10*3/uL (ref 1.5–6.5)
NEUT%: 66.9 % (ref 38.4–76.8)
Platelets: 179 10*3/uL (ref 145–400)
RBC: 3.83 10*6/uL (ref 3.70–5.45)
RDW: 14.4 % (ref 11.2–14.5)
WBC: 4.9 10*3/uL (ref 3.9–10.3)

## 2013-12-18 LAB — URINALYSIS, MICROSCOPIC - CHCC
BILIRUBIN (URINE): NEGATIVE
GLUCOSE UR CHCC: NEGATIVE mg/dL
KETONES: NEGATIVE mg/dL
Nitrite: NEGATIVE
Protein: <30 mg/dL
SPECIFIC GRAVITY, URINE: 1.01 (ref 1.003–1.035)
Urobilinogen, UR: 0.2 mg/dL (ref 0.2–1)
pH: 6.5 (ref 4.6–8.0)

## 2013-12-18 NOTE — Telephone Encounter (Signed)
gve the pt her aug 2015 appt calendar.

## 2013-12-18 NOTE — Progress Notes (Signed)
  [facility] OFFICE PROGRESS NOTE   Diagnosis: Ovarian cancer  INTERVAL HISTORY:   Victoria [name] returns as scheduled. She completed radiation 10/22/2013. She feels well. No pain. Good appetite. She had "pink "urine one day last week. No gross hematuria. The colostomy is functioning well. She urinates frequently.  Objective:  Vital signs in last 24 hours:  Blood pressure 121/55, pulse 64, temperature 97.6 F (36.4 C), temperature source Oral, resp. rate 18, height 5\' 1"  (1.549 m), weight 143 lb 4.8 oz (65 kg).    HEENT: Neck without mass Lymphatics: No cervical, supraclavicular, axillary, or inguinal nodes Resp: Lungs clear bilaterally with decreased breath sounds at the right lower chest, no respiratory distress Cardio: Regular rate and rhythm GI: No hepatomegaly, left lower quadrant colostomy, no mass, no apparent ascites, nontender Vascular: No leg edema   Lab Results:  Lab Results  Component Value Date   WBC 4.9 12/18/2013   HGB 12.1 12/18/2013   HCT 36.6 12/18/2013   MCV 95.6 12/18/2013   PLT 179 12/18/2013   NEUTROABS 3.3 12/18/2013     Medications: I have reviewed the patient's current medications.  Assessment/Plan: 1. Ovarian cancer-presenting with a rectal mass , status post sigmoidoscopy 03/13/2012 with findings of a 5 cm malignant appearing mass with central ulceration in the rectum 2-3 cm proximal to the anal verge. Pathology showed moderate to poorly differentiated adenocarcinoma with ulceration and prolapse changes. By immunohistochemistry the malignant cells were positive for cytokeratin 7, estrogen receptor and WT-1. They were negative for cytokeratin 20 and CDX-2. This immunohistochemical profile was strongly suggestive of a gynecologic primary. -Status post 3 cycles of Taxol/carboplatin chemotherapy with normalization of the CA 125, restaging CT 08/07/2012 with marked improvement in the pelvic masses and no evidence of progressive ovarian cancer. She  completed cycle 4 of Taxol/carboplatin beginning 08/08/2012. 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 She completed cycle 6 Taxol/carboplatin beginning 11/21/2012. 2. History of Atrial fibrillation  3. Status post Port-A-Cath placement 05/04/2012. Port-A-Cath removal 02/21/2013 4. 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 5. Admission 09/02/2013 with gross hematuria-right pelvic mass with involvement of the bladder confirmed on CT with a cystoscopy confirming a large tumor in the bladder with associated bleeding, status post TURBT and fulguration, pathology consistent with a high-grade carcinoma. Immunohistochemical stains returned positive for cytokeratin 7 and WT-1, and negative for cytokeratin 20 and cytokeratin 903. She completed a course of palliative radiation 09/13/2013 through 10/22/2013. 6. Anemia secondary to bleeding. Improved. She continues iron. 7. Urinary frequency-we will check a urinalysis today   Disposition:  [name] is in clinical remission from ovarian cancer. She will return for an office and lab visit in 3 months. She will contact us in the interim for gross hematuria or new symptoms. We will check a urinalysis today.  Ladell Pier, MD  12/18/2013  11:08 AM

## 2013-12-19 ENCOUNTER — Telehealth: Payer: Self-pay | Admitting: Oncology

## 2013-12-19 ENCOUNTER — Telehealth: Payer: Self-pay | Admitting: *Deleted

## 2013-12-19 DIAGNOSIS — R300 Dysuria: Secondary | ICD-10-CM

## 2013-12-19 MED ORDER — CIPROFLOXACIN HCL 500 MG PO TABS: 500.0000 mg | ORAL_TABLET | Freq: Two times a day (BID) | ORAL | Status: DC

## 2013-12-19 NOTE — Telephone Encounter (Signed)
Message copied by Brien Few on Wed Dec 19, 2013 11:56 AM ------      Message from: Ladell Pier      Created: Tue Dec 18, 2013  9:27 PM       Please call patient, urine with wbcs, send urine culture, start ciprofloxacin 500mg  bid for 5 days ------

## 2013-12-19 NOTE — Telephone Encounter (Addendum)
Pt has intolerance to Levofloxacin- reviewed with Dr. Benay Spice: Noted, OK to try Cipro. Called pt with instructions

## 2013-12-19 NOTE — Telephone Encounter (Signed)
pt called and request that appt be mailed....mailed pt appt sched/avs and letter for Aug

## 2013-12-20 LAB — URINE CULTURE

## 2013-12-21 ENCOUNTER — Telehealth: Payer: Self-pay | Admitting: *Deleted

## 2013-12-21 NOTE — Telephone Encounter (Signed)
Called pt with results. She voiced understanding, knows to call office for bleeding.

## 2013-12-21 NOTE — Telephone Encounter (Signed)
Message copied by Brien Few on Fri Dec 21, 2013  4:12 PM ------      Message from: [name] B      Created: Thu Dec 20, 2013  5:57 PM       Please call patient, culture is negative, finish antibiotics, call for hematuria ------

## 2014-01-08 ENCOUNTER — Other Ambulatory Visit: Payer: Self-pay

## 2014-01-08 DIAGNOSIS — Z1231 Encounter for screening mammogram for malignant neoplasm of breast: Secondary | ICD-10-CM

## 2014-02-13 ENCOUNTER — Encounter: Payer: Self-pay | Admitting: *Deleted

## 2014-02-27 ENCOUNTER — Encounter: Payer: Self-pay | Admitting: *Deleted

## 2014-03-05 ENCOUNTER — Telehealth: Payer: Self-pay | Admitting: Cardiovascular Disease

## 2014-03-07 NOTE — Telephone Encounter (Signed)
Closed encounter °

## 2014-03-13 ENCOUNTER — Other Ambulatory Visit: Payer: Self-pay | Admitting: Cardiovascular Disease

## 2014-03-14 NOTE — Telephone Encounter (Signed)
Rx was sent to pharmacy electronically. 

## 2014-03-19 ENCOUNTER — Telehealth: Payer: Self-pay | Admitting: Oncology

## 2014-03-19 ENCOUNTER — Other Ambulatory Visit (HOSPITAL_BASED_OUTPATIENT_CLINIC_OR_DEPARTMENT_OTHER): Payer: 59

## 2014-03-19 ENCOUNTER — Ambulatory Visit (HOSPITAL_BASED_OUTPATIENT_CLINIC_OR_DEPARTMENT_OTHER): Payer: Medicare Other | Admitting: Oncology

## 2014-03-19 VITALS — BP 135/61 | HR 60 | Temp 97.4°F | Resp 20 | Ht 61.0 in | Wt 142.9 lb

## 2014-03-19 DIAGNOSIS — C20 Malignant neoplasm of rectum: Secondary | ICD-10-CM

## 2014-03-19 DIAGNOSIS — C569 Malignant neoplasm of unspecified ovary: Secondary | ICD-10-CM

## 2014-03-19 DIAGNOSIS — R310 Gross hematuria: Secondary | ICD-10-CM

## 2014-03-19 DIAGNOSIS — Z8543 Personal history of malignant neoplasm of ovary: Secondary | ICD-10-CM

## 2014-03-19 DIAGNOSIS — R300 Dysuria: Secondary | ICD-10-CM

## 2014-03-19 LAB — CBC WITH DIFFERENTIAL/PLATELET
BASO%: 0.6 % (ref 0.0–2.0)
Basophils Absolute: 0 10*3/uL (ref 0.0–0.1)
EOS ABS: 0.1 10*3/uL (ref 0.0–0.5)
EOS%: 3 % (ref 0.0–7.0)
HCT: 40.3 % (ref 34.8–46.6)
HGB: 13.4 g/dL (ref 11.6–15.9)
LYMPH%: 20.3 % (ref 14.0–49.7)
MCH: 31.9 pg (ref 25.1–34.0)
MCHC: 33.3 g/dL (ref 31.5–36.0)
MCV: 95.8 fL (ref 79.5–101.0)
MONO#: 0.4 10*3/uL (ref 0.1–0.9)
MONO%: 9.6 % (ref 0.0–14.0)
NEUT#: 2.9 10*3/uL (ref 1.5–6.5)
NEUT%: 66.5 % (ref 38.4–76.8)
PLATELETS: 169 10*3/uL (ref 145–400)
RBC: 4.2 10*6/uL (ref 3.70–5.45)
RDW: 14.2 % (ref 11.2–14.5)
WBC: 4.4 10*3/uL (ref 3.9–10.3)
lymph#: 0.9 10*3/uL (ref 0.9–3.3)

## 2014-03-19 NOTE — Telephone Encounter (Signed)
gv adn printed appts ched and avs for pt fro NOV °

## 2014-03-19 NOTE — Progress Notes (Signed)
  [facility] OFFICE PROGRESS NOTE   Diagnosis: Ovarian cancer  INTERVAL HISTORY:   Victoria [name] returns as scheduled. She feels well. Good appetite and energy level. She is active getting out of the home. She reports an episode of blood-streaked urine approximately 2 weeks ago. No dysuria.  She reports a transient episode of dysarthria 2 months ago after "stress "dropping her husband today doctor's appointment. The dysarthria resolved over approximately 30 minutes.  Objective:  Vital signs in last 24 hours:  Blood pressure 135/61, pulse 60, temperature 97.4 F (36.3 C), temperature source Oral, resp. rate 20, height 5\' 1"  (1.549 m), weight 142 lb 14.4 oz (64.819 kg).    Resp: Decreased breath sounds at the right compared to the left chest, no respiratory distress Cardio: Regular rate and rhythm GI: No hepatomegaly, no apparent ascites, left lower quadrant colostomy, nontender, no mass Vascular: No leg edema Neuro: Alert and oriented      Lab Results:  Lab Results  Component Value Date   WBC 4.4 03/19/2014   HGB 13.4 03/19/2014   HCT 40.3 03/19/2014   MCV 95.8 03/19/2014   PLT 169 03/19/2014   NEUTROABS 2.9 03/19/2014    Medications: I have reviewed the patient's current medications.  Assessment/Plan: 1. Ovarian cancer-presenting with a rectal mass , status post sigmoidoscopy 03/13/2012 with findings of a 5 cm malignant appearing mass with central ulceration in the rectum 2-3 cm proximal to the anal verge. Pathology showed moderate to poorly differentiated adenocarcinoma with ulceration and prolapse changes. By immunohistochemistry the malignant cells were positive for cytokeratin 7, estrogen receptor and WT-1. They were negative for cytokeratin 20 and CDX-2. This immunohistochemical profile was strongly suggestive of a gynecologic primary. -Status post 3 cycles of Taxol/carboplatin chemotherapy with normalization of the CA 125, restaging CT 08/07/2012 with marked  improvement in the pelvic masses and no evidence of progressive ovarian cancer. She completed cycle 4 of Taxol/carboplatin beginning 08/08/2012. 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 She completed cycle 6 Taxol/carboplatin beginning 11/21/2012. 2. History of Atrial fibrillation  3. Status post Port-A-Cath placement 05/04/2012. Port-A-Cath removal 02/21/2013 4. 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 5. Admission 09/02/2013 with gross hematuria-right pelvic mass with involvement of the bladder confirmed on CT with a cystoscopy confirming a large tumor in the bladder with associated bleeding, status post TURBT and fulguration, pathology consistent with a high-grade carcinoma. Immunohistochemical stains returned positive for cytokeratin 7 and WT-1, and negative for cytokeratin 20 and cytokeratin 903. She completed a course of palliative radiation 09/13/2013 through 10/22/2013. 6. History of Anemia secondary to bleeding. Improved. She continues iron.      Disposition:  Victoria [name] remains in clinical remission from ovarian cancer. We checked a urinalysis today to look for evidence of infection. She will contact us if the gross hematuria becomes more consistent. She is not anemic.  We will followup on the CA 125 from today. She will return for an office visit in 3 months. She will contact Dr. Philip Aspen for recurrent neurologic symptoms Victoria Coder, MD  03/19/2014  9:23 AM

## 2014-03-20 ENCOUNTER — Telehealth: Payer: Self-pay | Admitting: *Deleted

## 2014-03-20 LAB — CA 125(PREVIOUS METHOD): CA 125: 8.2 U/mL (ref 0.0–30.2)

## 2014-03-20 LAB — CA 125: CA 125: 9 U/mL (ref <35)

## 2014-03-20 NOTE — Telephone Encounter (Signed)
Message copied by Domenic Schwab on Wed Mar 20, 2014  5:03 PM ------      Message from: [name] B      Created: Wed Mar 20, 2014  2:28 PM       Please call patient, ca125 is normal ------

## 2014-03-20 NOTE — Telephone Encounter (Signed)
Per Dr. Sherrill; notified pt that ca 125 is normal.  Pt verbalized understanding and expressed appreciation for call 

## 2014-03-28 ENCOUNTER — Encounter: Payer: Self-pay | Admitting: Cardiovascular Disease

## 2014-03-28 ENCOUNTER — Ambulatory Visit (INDEPENDENT_AMBULATORY_CARE_PROVIDER_SITE_OTHER): Payer: 59 | Admitting: Cardiovascular Disease

## 2014-03-28 VITALS — BP 118/76 | HR 62 | Resp 11 | Ht 61.0 in | Wt 144.0 lb

## 2014-03-28 DIAGNOSIS — I480 Paroxysmal atrial fibrillation: Secondary | ICD-10-CM

## 2014-03-28 DIAGNOSIS — I495 Sick sinus syndrome: Secondary | ICD-10-CM

## 2014-03-28 DIAGNOSIS — Z950 Presence of cardiac pacemaker: Secondary | ICD-10-CM

## 2014-03-28 DIAGNOSIS — I4891 Unspecified atrial fibrillation: Secondary | ICD-10-CM

## 2014-03-28 DIAGNOSIS — I471 Supraventricular tachycardia, unspecified: Secondary | ICD-10-CM

## 2014-03-28 LAB — MDC_IDC_ENUM_SESS_TYPE_INCLINIC
Battery Voltage: 2.75 V
Brady Statistic RA Percent Paced: 69 %
Implantable Pulse Generator Model: 5826
Implantable Pulse Generator Serial Number: 2195755
Lead Channel Impedance Value: 387 Ohm
Lead Channel Pacing Threshold Amplitude: 1.25 V
Lead Channel Pacing Threshold Pulse Width: 0.4 ms
Lead Channel Setting Pacing Amplitude: 2 V
MDC IDC MSMT LEADCHNL RA PACING THRESHOLD AMPLITUDE: 0.75 V
MDC IDC MSMT LEADCHNL RA PACING THRESHOLD PULSEWIDTH: 0.4 ms
MDC IDC MSMT LEADCHNL RA SENSING INTR AMPL: 1.2 mV
MDC IDC MSMT LEADCHNL RV IMPEDANCE VALUE: 400 Ohm
MDC IDC MSMT LEADCHNL RV SENSING INTR AMPL: 5.2 mV
MDC IDC SET LEADCHNL RV PACING AMPLITUDE: 2.5 V
MDC IDC SET LEADCHNL RV PACING PULSEWIDTH: 0.4 ms
MDC IDC SET LEADCHNL RV SENSING SENSITIVITY: 1.5 mV
MDC IDC STAT BRADY RV PERCENT PACED: 1 % — AB

## 2014-03-28 NOTE — Progress Notes (Signed)
Patient ID: Victoria [name], female   DOB: 1926/09/27, 80 y.o.   MRN: [id number]     Reason for office visit Atrial fibrillation/atrial flutter, pacemaker followup  Victoria [name] has done very well since her last appointment. She has not had symptoms to suggest recurrent arrhythmia.  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 Over the winter, she actually spent several months in atrial flutter or atrial fibrillation and then spontaneously converted to normal rhythm early this year.  Interrogation of her pacemaker shows that she had another episode of lengthy persistent atrial fibrillation that occurred between mid-May and early June (a total of about 38 days) but since then all her episodes of atrial arrhythmia been relatively brief period most of them have been measured in seconds or minutes, she did have 3 hours and 24 minutes of atrial fibrillation on August 11. None of these episodes were symptomatic and her rate control appears to be fair with average ventricular rates no higher than 100-110. During all these times she has not had episodes of stroke or other notable embolic events, despite the fact that she is not receiving any stroke prevention medication. Anticoagulants and even aspirin had to be stopped because of recurrent hematuria. Even now her urine is "pink"intermittently. Dr. Gearldine Shown note from a few days ago states that she had an episode of dysarthria that lasted for about 30 minutes, but [name] insisted this was simply excessive stress that made it difficult for her to concentrate.   Allergies  Allergen Reactions  . Pindolol Nausea Only and Swelling    Throat swelled up  . Iodine Other (See Comments)    BLISTERS  . Shellfish Allergy Diarrhea and Nausea And Vomiting  . Vibramycin [Doxycycline Calcium] Nausea Only and Other (See Comments)    dizziness  . Doxycycline  Nausea Only  . Levofloxacin Other (See Comments)    DIZZINESS  . Multaq [Dronedarone] Other (See Comments)    CAUSED SEVERE BURNING GI TRACT  . Vicodin [Hydrocodone-Acetaminophen] Other (See Comments)    dizziness    Current Outpatient Prescriptions  Medication Sig Dispense Refill  . CARTIA XT 240 MG 24 hr capsule take 1 capsule by mouth once daily  90 capsule  1  . feeding supplement (ENSURE IMMUNE HEALTH) LIQD Take 237 mLs by mouth daily.       . iron polysaccharides (NIFEREX) 150 MG capsule Take 1 capsule (150 mg total) by mouth daily.  30 capsule  6  . lactose free nutrition (BOOST) LIQD Take 237 mLs by mouth daily.       No current facility-administered medications for this visit.   Facility-Administered Medications Ordered in Other Visits  Medication Dose Route Frequency Provider Last Rate Last Dose  . heparin 6,000 Units in sodium chloride irrigation 0.9 % 500 mL irrigation   Irrigation Once Rolm Bookbinder, MD      . sodium chloride 0.9 % injection 10 mL  10 mL Intracatheter PRN Ladell Pier, MD   10 mL at 07/18/12 1248    Past Medical History  Diagnosis Date  . Hyperlipidemia   . Rectal carcinoma   . Colon cancer 03/13/12 egd    rectal mass5cm  , 2-3cm proximal to anal verge  . Diverticulosis   . Allergy   . Anxiety     rhematoid  . Colostomy in place   . PONV (postoperative nausea and vomiting)   .  Chronic anticoagulation   . Fluid collection (edema) in the arms, legs, hands and feet   . Urinary frequency     with burning x 2 days  . History of blood transfusion   . Hypertension   . Pacemaker   . Coronary artery disease     DR. Gwendlyon Zumbro IS PT'S CARDIOLOGIST  . Atrial fibrillation   . Sinus node dysfunction 09/10/2008    Pacemaker [employer]  . RBBB   . PAT (paroxysmal atrial tachycardia)     Past Surgical History  Procedure Laterality Date  . Permanent pacemaker insertion  09/10/2008    [employer]  . Rib removed  1934    Pleurisy and pneumonia  .  Partial hysterectomy  1972  . Bladder neck suspension  1986  . Heart catherization  [id number]  . Abdominal hysterectomy      both ovaries intact,   . Colon resection  04/04/2012    Procedure: COLON RESECTION;  Surgeon: Rolm Bookbinder, MD;  Location: [facility];  Service: General;  Laterality: N/A;  laparotomy with small bowel resection for obstruction with colostomy with gastrostomy tube placement.   . Portacath placement  05/04/2012    Procedure: INSERTION PORT-A-CATH;  Surgeon: Rolm Bookbinder, MD;  Location: [facility];  Service: General;  Laterality: Right;  . Insert / replace / remove pacemaker  2/10  . Laparotomy Bilateral 10/03/2012    Procedure: EXPLORATORY LAPAROTOMY;  Surgeon: Imagene Gurney A. Alycia Rossetti, MD;  Location: [facility];  Service: Gynecology;  Laterality: Bilateral;  WITH BSO, TUMOR DEBULKING  . Colostomy revision N/A 10/03/2012    Procedure: COLOSTOMY REVISION;  Surgeon: Rolm Bookbinder, MD;  Location: [facility];  Service: General;  Laterality: N/A;  COLOSTOMY REVISION, Sigmoid colectomy  . US echocardiography  07/17/2008    EF =>55%,mild mitral annular ca+,mild-mod MR,TR,AOV mildly sclerotic  . Nm myoview ltd  07/17/2008    no ischemia  . Port-a-cath removal N/A 02/21/2013    Procedure: REMOVAL PORT-A-CATH;  Surgeon: Rolm Bookbinder, MD;  Location: [facility];  Service: General;  Laterality: N/A;  . Transurethral resection of bladder tumor with gyrus (turbt-gyrus) N/A 09/05/2013    Procedure: TRANSURETHRAL RESECTION OF BLADDER TUMOR WITH GYRUS (TURBT-GYRUS);  Surgeon: Fredricka Bonine, MD;  Location: [facility];  Service: Urology;  Laterality: N/A;    Family History  Problem Relation Age of Onset  . Heart disease Mother     History   Social History  . Marital Status: Married    Spouse Name: N/A    Number of Children: 2  . Years of Education: N/A   Occupational History  . Retired     Event organiser,     Social History Main Topics  . Smoking status: Never Smoker   . Smokeless  tobacco: Never Used  . Alcohol Use: No  . Drug Use: No  . Sexual Activity: Not on file   Other Topics Concern  . Not on file   Social History Narrative  . No narrative on file    Review of systems: The patient specifically denies any chest pain at rest or with exertion, dyspnea at rest or with exertion, orthopnea, paroxysmal nocturnal dyspnea, syncope, palpitations, focal neurological deficits, intermittent claudication, lower extremity edema, unexplained weight gain, cough, hemoptysis or wheezing.  The patient also denies abdominal pain, nausea, vomiting, dysphagia, diarrhea, constipation, polyuria, polydipsia, dysuria, hematuria, frequency, urgency, abnormal bleeding or bruising, fever, chills, unexpected weight changes, mood swings, change in skin or hair texture, change in voice quality, auditory or visual problems, allergic  reactions or rashes, new musculoskeletal complaints other than usual "aches and pains".   PHYSICAL EXAM BP 118/76  Pulse 62  Resp 11  Ht 5\' 1"  (1.549 m)  Wt 144 lb (65.318 kg)  BMI 27.22 kg/m2 General: Alert, oriented x3, no distress  Head: no evidence of trauma, PERRL, EOMI, no exophtalmos or lid lag, no myxedema, no xanthelasma; normal ears, nose and oropharynx  Neck: normal jugular venous pulsations and no hepatojugular reflux; brisk carotid pulses without delay and no carotid bruits  Chest: clear to auscultation, no signs of consolidation by percussion or palpation, normal fremitus, symmetrical and full respiratory excursions  Cardiovascular: normal position and quality of the apical impulse, irregular rhythm, normal first and second heart sounds, no murmurs, rubs or gallops  Abdomen: no tenderness or distention, no masses by palpation, no abnormal pulsatility or arterial bruits, normal bowel sounds, no hepatosplenomegaly; colostomy  Extremities: no clubbing, cyanosis or edema; 2+ radial, ulnar and brachial pulses bilaterally; 2+ right femoral, posterior  tibial and dorsalis pedis pulses; 2+ left femoral, posterior tibial and dorsalis pedis pulses; no subclavian or femoral bruits  Neurological: grossly nonfocal   EKG: Atrial paced, ventricular sense, right bundle branch block, no repolarization abnormalities  Lipid Panel     Component Value Date/Time   CHOL 115 04/10/2012 0520   TRIG 80 04/10/2012 0520   HDL 43 05/15/2008 0620   CHOLHDL 3.3 05/15/2008 0620   VLDL 17 05/15/2008 0620   LDLCALC  Value: 82        Total Cholesterol/HDL:CHD Risk Coronary Heart Disease Risk Table                     Men   Women  1/2 Average Risk   3.4   3.3 05/15/2008 0620    BMET    Component Value Date/Time   NA 134* 09/06/2013 0355   NA 135* 11/21/2012 0920   K 3.9 09/06/2013 0355   K 4.3 11/21/2012 0920   CL 100 09/06/2013 0355   CL 103 11/21/2012 0920   CO2 26 09/06/2013 0355   CO2 25 11/21/2012 0920   GLUCOSE 108* 09/06/2013 0355   GLUCOSE 101* 11/21/2012 0920   BUN 7 09/06/2013 0355   BUN 13.3 11/21/2012 0920   CREATININE 0.88 09/06/2013 0355   CREATININE 0.8 11/21/2012 0920   CALCIUM 8.2* 09/06/2013 0355   CALCIUM 8.7 11/21/2012 0920   GFRNONAA 59* 09/06/2013 0355   GFRAA 68* 09/06/2013 0355     ASSESSMENT AND PLAN PAF fib/flutter- Hx RFA 2010  In the past, she seemed to be symptomatic when in atrial fibrillation, but the lengthy episode occurred in May seemed to be very well tolerated. I am most concerned by the fact that she is not receiving any stroke prevention secondary to her recurrent hematuria. I would like to resume at least 81 mg aspirin daily. Ideally she should be on full warfarin anticoagulation or equivalent novel agent. S/P [facility] Pace Feb 2006 after failed RFA  Pacemaker check in clinic today shows normal device function. Thresholds, sensing, impedances consistent with previous measurements. Device programmed to maximize longevity. Overall atrial fibrillation burden and 12%. Atrial pacing 69%. Ventricular pacing less than 1%. Device  programmed to optimize intrinsic conduction. Estimated longevity 4-6.25 years (without EGMs). EGM recordings were turned off to prolong device longevity.  Patient will follow up with me for a pacemaker check in 6 months. I have asked her to please discuss reinitiation of aspirin therapy with Dr. Benay Spice at  their upcoming appointment.  Orders Placed This Encounter  Procedures  . EKG 12-Lead   No orders of the defined types were placed in this encounter.    Holli Humbles, MD, [place] [phone] office [phone] pager

## 2014-03-28 NOTE — Patient Instructions (Signed)
Dr. Sallyanne Kuster recommends that you schedule a follow-up appointment in: 6 months with pacemaker check.

## 2014-03-29 ENCOUNTER — Telehealth: Payer: Self-pay | Admitting: Cardiovascular Disease

## 2014-03-29 NOTE — Telephone Encounter (Signed)
i spoke to [facility] and told pt not worry about the heparin and flush on the AVS sheet that it probable populates automatically to AVS sheet

## 2014-03-29 NOTE — Telephone Encounter (Signed)
Please call,question about papers she received yesterday regarding her office visit.

## 2014-04-01 NOTE — Progress Notes (Signed)
Please ask her to resume ASA 81 mg daily

## 2014-04-02 NOTE — Progress Notes (Signed)
[facility] 04/02/14 1:27 pm

## 2014-04-03 ENCOUNTER — Ambulatory Visit
Admission: RE | Admit: 2014-04-03 | Discharge: 2014-04-03 | Disposition: A | Discharge status: Home / Self Care | Payer: Medicare Other | Source: Ambulatory Visit

## 2014-04-03 DIAGNOSIS — Z1231 Encounter for screening mammogram for malignant neoplasm of breast: Secondary | ICD-10-CM

## 2014-04-04 ENCOUNTER — Telehealth: Payer: Self-pay | Admitting: Cardiovascular Disease

## 2014-04-04 NOTE — Telephone Encounter (Signed)
Received message will also send to Dr. Loletha Grayer for review.

## 2014-04-04 NOTE — Telephone Encounter (Signed)
I understand. Please reassure her Dr. Benay Spice agreed it was safe to start ASA

## 2014-04-04 NOTE — Telephone Encounter (Signed)
She have not started on her 81 mg of aspirin.She have started spotting again and she afraid is to take it.

## 2014-04-05 NOTE — Telephone Encounter (Signed)
Called patient to give her Dr. Lurline Del reponse.  She still wants to wait a while till the spotting has clear and then she will restart the ASA 81mg .  States it really scares her when she sees the blood.

## 2014-04-22 ENCOUNTER — Telehealth: Payer: Self-pay | Admitting: *Deleted

## 2014-04-22 NOTE — Telephone Encounter (Signed)
Call from pt reporting blood in urine for past two nights. Has not noticed during the day. Feels "great" denies dysuria. Reviewed with Dr. Benay Spice: Monitor for now, will need to see urologist if this persists. Pt verbalized understanding.

## 2014-04-24 ENCOUNTER — Encounter: Payer: Self-pay | Admitting: Cardiology

## 2014-05-27 ENCOUNTER — Encounter: Payer: Self-pay | Admitting: Cardiovascular Disease

## 2014-06-11 ENCOUNTER — Other Ambulatory Visit (HOSPITAL_BASED_OUTPATIENT_CLINIC_OR_DEPARTMENT_OTHER): Payer: Medicare Other

## 2014-06-11 ENCOUNTER — Ambulatory Visit (HOSPITAL_BASED_OUTPATIENT_CLINIC_OR_DEPARTMENT_OTHER): Payer: Medicare Other | Admitting: Nurse Practitioner

## 2014-06-11 ENCOUNTER — Telehealth: Payer: Self-pay | Admitting: Nurse Practitioner

## 2014-06-11 VITALS — BP 151/77 | HR 66 | Temp 97.6°F | Resp 19 | Ht 61.0 in | Wt 145.4 lb

## 2014-06-11 DIAGNOSIS — C569 Malignant neoplasm of unspecified ovary: Secondary | ICD-10-CM

## 2014-06-11 DIAGNOSIS — C561 Malignant neoplasm of right ovary: Secondary | ICD-10-CM

## 2014-06-11 DIAGNOSIS — D500 Iron deficiency anemia secondary to blood loss (chronic): Secondary | ICD-10-CM

## 2014-06-11 DIAGNOSIS — R310 Gross hematuria: Secondary | ICD-10-CM

## 2014-06-11 DIAGNOSIS — C7911 Secondary malignant neoplasm of bladder: Secondary | ICD-10-CM

## 2014-06-11 DIAGNOSIS — R319 Hematuria, unspecified: Secondary | ICD-10-CM

## 2014-06-11 LAB — URINALYSIS, MICROSCOPIC - CHCC
BILIRUBIN (URINE): NEGATIVE
GLUCOSE UR CHCC: NEGATIVE mg/dL
KETONES: NEGATIVE mg/dL
Nitrite: NEGATIVE
Protein: 100 mg/dL
SPECIFIC GRAVITY, URINE: 1.005 (ref 1.003–1.035)
Urobilinogen, UR: 0.2 mg/dL (ref 0.2–1)
pH: 6.5 (ref 4.6–8.0)

## 2014-06-11 NOTE — Progress Notes (Signed)
  [place] OFFICE PROGRESS NOTE   Diagnosis:  Ovarian cancer  INTERVAL HISTORY:   [name] returns as scheduled. She feels "good". She denies pain. She has a good appetite and remains very active. She denies nausea/vomiting. Ostomy is functioning well. Only complaint today is increased occurrences of hematuria. She denies dysuria.  Objective:  Vital signs in last 24 hours:  Blood pressure 151/77, pulse 66, temperature 97.6 F (36.4 C), temperature source Oral, resp. rate 19, height 5\' 1"  (1.549 m), weight 145 lb 6.4 oz (65.953 kg).    HEENT: no thrush or ulcers. Lymphatics: no palpable cervical, supraclavicular or axillary lymph nodes. Resp: breath sounds diminished right lung field. No respiratory distress. Cardio: regular rate and rhythm. GI: abdomen is soft and nontender. No hepatomegaly. Left lower quadrant colostomy. No mass. Vascular: no leg edema. Calves soft and nontender. Neuro:alert and oriented.     Lab Results:  Lab Results  Component Value Date   WBC 4.4 03/19/2014   HGB 13.4 03/19/2014   HCT 40.3 03/19/2014   MCV 95.8 03/19/2014   PLT 169 03/19/2014   NEUTROABS 2.9 03/19/2014    Imaging:  No results found.  Medications: I have reviewed the patient's current medications.  Assessment/Plan: 1. Ovarian cancer-presenting with a rectal mass , status post sigmoidoscopy 03/13/2012 with findings of a 5 cm malignant appearing mass with central ulceration in the rectum 2-3 cm proximal to the anal verge. Pathology showed moderate to poorly differentiated adenocarcinoma with ulceration and prolapse changes. By immunohistochemistry the malignant cells were positive for cytokeratin 7, estrogen receptor and WT-1. They were negative for cytokeratin 20 and CDX-2. This immunohistochemical profile was strongly suggestive of a gynecologic primary. -Status post 3 cycles of Taxol/carboplatin chemotherapy with normalization of the CA 125, restaging CT  08/07/2012 with marked improvement in the pelvic masses and no evidence of progressive ovarian cancer. She completed cycle 4 of Taxol/carboplatin beginning 08/08/2012. 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 She completed cycle 6 Taxol/carboplatin beginning 11/21/2012. 2. History of Atrial fibrillation  3. Status post Port-A-Cath placement 05/04/2012. Port-A-Cath removal 02/21/2013 4. 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 5. Admission 09/02/2013 with gross hematuria-right pelvic mass with involvement of the bladder confirmed on CT with a cystoscopy confirming a large tumor in the bladder with associated bleeding, status post TURBT and fulguration, pathology consistent with a high-grade carcinoma. Immunohistochemical stains returned positive for cytokeratin 7 and WT-1, and negative for cytokeratin 20 and cytokeratin 903. She completed a course of palliative radiation 09/13/2013 through 10/22/2013. 6. History of anemia secondary to bleeding. She continues iron.   Disposition: [name] appears stable. She continues to have a good performance status. We will followup on the CA 125 from today.   She is experiencing more frequent episodes of hematuria. We will contact urology to schedule an appointment.   She will return for a followup visit here in 3 months. She will contact the office in the interim with any problems.  Plan reviewed with Dr. Benay Spice.    Ned Card ANP/GNP-BC   06/11/2014  10:46 AM

## 2014-06-11 NOTE — Telephone Encounter (Signed)
Gave avs & cal Feb 2016.

## 2014-06-12 ENCOUNTER — Telehealth: Payer: Self-pay | Admitting: *Deleted

## 2014-06-12 LAB — CA 125(PREVIOUS METHOD): CA 125: 7.6 U/mL (ref 0.0–30.2)

## 2014-06-12 LAB — CA 125: CA 125: 11 U/mL (ref <35)

## 2014-06-12 NOTE — Telephone Encounter (Signed)
Called and informed patient that ca125 is normal.  Per Dr. Benay Spice.  Patient verbalized understanding.

## 2014-06-12 NOTE — Telephone Encounter (Signed)
-----   Message from Ladell Pier, MD sent at 06/11/2014  9:08 PM EST ----- Please call patient, ca125 is normal

## 2014-06-19 ENCOUNTER — Other Ambulatory Visit: Payer: Self-pay | Admitting: Urology

## 2014-06-24 NOTE — Patient Instructions (Addendum)
Dayna ARRON MCNAUGHT  06/24/2014   Your procedure is scheduled on:  06/27/2014    Come thru the Emergency Room Entrance.   Follow the Signs to [place] at  0530      am  Call this number if you have problems the morning of surgery: [phone]   Remember:   Do not eat food or drink liquids after midnight.   Take these medicines the morning of surgery with A SIP OF WATER: Cardiazem   Do not wear jewelry, make-up or nail polish.  Do not wear lotions, powders, or perfumes.  deodorant.  Do not shave 48 hours prior to surgery. .  Do not bring valuables to the hospital.  Contacts, dentures or bridgework may not be worn into surgery.  Leave suitcase in the car. After surgery it may be brought to your room.  For patients admitted to the hospital, checkout time is 11:00 AM the day of  discharge.      Please read over the following fact sheets that you were given: , coughing and deep breathing exercises, leg exercises            [place] - Preparing for Surgery Before surgery, you can play an important role.  Because skin is not sterile, your skin needs to be as free of germs as possible.  You can reduce the number of germs on your skin by washing with CHG (chlorahexidine gluconate) soap before surgery.  CHG is an antiseptic cleaner which kills germs and bonds with the skin to continue killing germs even after washing. Please DO NOT use if you have an allergy to CHG or antibacterial soaps.  If your skin becomes reddened/irritated stop using the CHG and inform your nurse when you arrive at Short Stay. Do not shave (including legs and underarms) for at least 48 hours prior to the first CHG shower.  You may shave your face/neck. Please follow these instructions carefully:  1.  Shower with CHG Soap the night before surgery and the  morning of Surgery.  2.  If you choose to wash your hair, wash your hair first as usual with your  normal  shampoo.  3.  After you shampoo, rinse your hair and body  thoroughly to remove the  shampoo.                           4.  Use CHG as you would any other liquid soap.  You can apply chg directly  to the skin and wash                       Gently with a scrungie or clean washcloth.  5.  Apply the CHG Soap to your body ONLY FROM THE NECK DOWN.   Do not use on face/ open                           Wound or open sores. Avoid contact with eyes, ears mouth and genitals (private parts).                       Wash face,  Genitals (private parts) with your normal soap.             6.  Wash thoroughly, paying special attention to the area where your surgery  will be performed.  7.  Thoroughly rinse your body  with warm water from the neck down.  8.  DO NOT shower/wash with your normal soap after using and rinsing off  the CHG Soap.                9.  Pat yourself dry with a clean towel.            10.  Wear clean pajamas.            11.  Place clean sheets on your bed the night of your first shower and do not  sleep with pets. Day of Surgery : Do not apply any lotions/deodorants the morning of surgery.  Please wear clean clothes to the hospital/surgery center.  FAILURE TO FOLLOW THESE INSTRUCTIONS MAY RESULT IN THE CANCELLATION OF YOUR SURGERY PATIENT SIGNATURE_________________________________  NURSE SIGNATURE__________________________________  ________________________________________________________________________

## 2014-06-24 NOTE — Progress Notes (Signed)
No ordersin EPIC.  Called and left message with Noemi Chapel.  Regarding no orders yet in EPIC.

## 2014-06-25 ENCOUNTER — Encounter (HOSPITAL_COMMUNITY)
Admission: RE | Admit: 2014-06-25 | Discharge: 2014-06-25 | Disposition: A | Discharge status: Home / Self Care | Payer: Medicare Other | Source: Ambulatory Visit | Attending: Urology | Admitting: Urology

## 2014-06-25 ENCOUNTER — Encounter (HOSPITAL_COMMUNITY): Payer: Self-pay

## 2014-06-25 ENCOUNTER — Telehealth: Payer: Self-pay | Admitting: *Deleted

## 2014-06-25 DIAGNOSIS — Z933 Colostomy status: Secondary | ICD-10-CM | POA: Diagnosis not present

## 2014-06-25 DIAGNOSIS — D303 Benign neoplasm of bladder: Secondary | ICD-10-CM | POA: Diagnosis present

## 2014-06-25 DIAGNOSIS — I10 Essential (primary) hypertension: Secondary | ICD-10-CM | POA: Diagnosis not present

## 2014-06-25 DIAGNOSIS — D649 Anemia, unspecified: Secondary | ICD-10-CM | POA: Diagnosis not present

## 2014-06-25 DIAGNOSIS — Z883 Allergy status to other anti-infective agents status: Secondary | ICD-10-CM | POA: Diagnosis not present

## 2014-06-25 DIAGNOSIS — Z950 Presence of cardiac pacemaker: Secondary | ICD-10-CM | POA: Diagnosis not present

## 2014-06-25 DIAGNOSIS — I2510 Atherosclerotic heart disease of native coronary artery without angina pectoris: Secondary | ICD-10-CM | POA: Diagnosis not present

## 2014-06-25 DIAGNOSIS — Z885 Allergy status to narcotic agent status: Secondary | ICD-10-CM | POA: Diagnosis not present

## 2014-06-25 DIAGNOSIS — C569 Malignant neoplasm of unspecified ovary: Secondary | ICD-10-CM | POA: Diagnosis not present

## 2014-06-25 DIAGNOSIS — C7911 Secondary malignant neoplasm of bladder: Secondary | ICD-10-CM | POA: Diagnosis not present

## 2014-06-25 DIAGNOSIS — F419 Anxiety disorder, unspecified: Secondary | ICD-10-CM | POA: Diagnosis not present

## 2014-06-25 DIAGNOSIS — Z79899 Other long term (current) drug therapy: Secondary | ICD-10-CM | POA: Diagnosis not present

## 2014-06-25 DIAGNOSIS — Z91013 Allergy to seafood: Secondary | ICD-10-CM | POA: Diagnosis not present

## 2014-06-25 DIAGNOSIS — Z923 Personal history of irradiation: Secondary | ICD-10-CM | POA: Diagnosis not present

## 2014-06-25 DIAGNOSIS — E785 Hyperlipidemia, unspecified: Secondary | ICD-10-CM | POA: Diagnosis not present

## 2014-06-25 DIAGNOSIS — Z85038 Personal history of other malignant neoplasm of large intestine: Secondary | ICD-10-CM | POA: Diagnosis not present

## 2014-06-25 HISTORY — DX: Malignant neoplasm of bladder, unspecified: C67.9

## 2014-06-25 LAB — BASIC METABOLIC PANEL
Anion gap: 11 (ref 5–15)
BUN: 15 mg/dL (ref 6–23)
CO2: 25 mEq/L (ref 19–32)
Calcium: 9.3 mg/dL (ref 8.4–10.5)
Chloride: 100 mEq/L (ref 96–112)
Creatinine, Ser: 0.99 mg/dL (ref 0.50–1.10)
GFR calc non Af Amer: 51 mL/min — ABNORMAL LOW (ref >90)
GFR, EST AFRICAN AMERICAN: 59 mL/min — AB (ref >90)
Glucose, Bld: 111 mg/dL — ABNORMAL HIGH (ref 70–99)
POTASSIUM: 4.1 meq/L (ref 3.7–5.3)
Sodium: 136 mEq/L — ABNORMAL LOW (ref 137–147)

## 2014-06-25 LAB — CBC
HEMATOCRIT: 38.6 % (ref 36.0–46.0)
Hemoglobin: 12.8 g/dL (ref 12.0–15.0)
MCH: 31.5 pg (ref 26.0–34.0)
MCHC: 33.2 g/dL (ref 30.0–36.0)
MCV: 95.1 fL (ref 78.0–100.0)
PLATELETS: 185 10*3/uL (ref 150–400)
RBC: 4.06 MIL/uL (ref 3.87–5.11)
RDW: 13.7 % (ref 11.5–15.5)
WBC: 4.3 10*3/uL (ref 4.0–10.5)

## 2014-06-25 NOTE — Telephone Encounter (Signed)
Signed perioperative Rx for implanted cardiac device programming faxed to [facility] pre-surgical.

## 2014-06-25 NOTE — Progress Notes (Signed)
CXR 09/03/13 EPIC  EKG- 03/28/14- EPIC  LOV with DR Croituri- 03/28/14 EPIC  Last Device Check- 03/28/14 EPIC

## 2014-06-26 NOTE — Anesthesia Preprocedure Evaluation (Addendum)
Anesthesia Evaluation  Patient identified by MRN, date of birth, ID band Patient awake    Reviewed: Allergy & Precautions, H&P , NPO status , Patient's Chart, lab work & pertinent test results  History of Anesthesia Complications (+) PONV and history of anesthetic complications  Airway Mallampati: II  TM Distance: >3 FB Neck ROM: Full    Dental no notable dental hx. (+) Dental Advisory Given   Pulmonary neg pulmonary ROS,  breath sounds clear to auscultation  Pulmonary exam normal       Cardiovascular hypertension, Pt. on medications + CAD negative cardio ROS  + dysrhythmias (rbbb) + pacemaker Rhythm:Regular Rate:Normal     Neuro/Psych PSYCHIATRIC DISORDERS Anxiety negative neurological ROS  negative psych ROS   GI/Hepatic negative GI ROS, Neg liver ROS,   Endo/Other  negative endocrine ROS  Renal/GU negative Renal ROS Bladder dysfunction   negative genitourinary   Musculoskeletal negative musculoskeletal ROS (+)   Abdominal   Peds negative pediatric ROS (+)  Hematology negative hematology ROS (+)   Anesthesia Other Findings Ovarian cancer  Reproductive/Obstetrics negative OB ROS                            Anesthesia Physical Anesthesia Plan  ASA: III  Anesthesia Plan: General   Post-op Pain Management:    Induction: Intravenous  Airway Management Planned: LMA  Additional Equipment:   Intra-op Plan:   Post-operative Plan: Extubation in OR  Informed Consent: I have reviewed the patients History and Physical, chart, labs and discussed the procedure including the risks, benefits and alternatives for the proposed anesthesia with the patient or authorized representative who has indicated his/her understanding and acceptance.   Dental advisory given  Plan Discussed with: CRNA  Anesthesia Plan Comments:         Anesthesia Quick Evaluation

## 2014-06-27 ENCOUNTER — Observation Stay (HOSPITAL_COMMUNITY)
Admission: RE | Admit: 2014-06-27 | Discharge: 2014-06-28 | Disposition: A | Discharge status: Home / Self Care | Payer: Medicare Other | Source: Ambulatory Visit | Attending: Urology | Admitting: Urology

## 2014-06-27 ENCOUNTER — Ambulatory Visit (HOSPITAL_COMMUNITY): Payer: Medicare Other | Admitting: Anesthesiology

## 2014-06-27 ENCOUNTER — Encounter (HOSPITAL_COMMUNITY)
Admission: RE | Disposition: A | Discharge status: Home / Self Care | Payer: Self-pay | Source: Ambulatory Visit | Attending: Urology

## 2014-06-27 ENCOUNTER — Encounter (HOSPITAL_COMMUNITY): Payer: Self-pay | Admitting: *Deleted

## 2014-06-27 DIAGNOSIS — F419 Anxiety disorder, unspecified: Secondary | ICD-10-CM | POA: Insufficient documentation

## 2014-06-27 DIAGNOSIS — Z91013 Allergy to seafood: Secondary | ICD-10-CM | POA: Insufficient documentation

## 2014-06-27 DIAGNOSIS — Z923 Personal history of irradiation: Secondary | ICD-10-CM | POA: Insufficient documentation

## 2014-06-27 DIAGNOSIS — D649 Anemia, unspecified: Secondary | ICD-10-CM | POA: Insufficient documentation

## 2014-06-27 DIAGNOSIS — C671 Malignant neoplasm of dome of bladder: Secondary | ICD-10-CM | POA: Diagnosis present

## 2014-06-27 DIAGNOSIS — I10 Essential (primary) hypertension: Secondary | ICD-10-CM | POA: Insufficient documentation

## 2014-06-27 DIAGNOSIS — C7911 Secondary malignant neoplasm of bladder: Secondary | ICD-10-CM | POA: Diagnosis not present

## 2014-06-27 DIAGNOSIS — Z85038 Personal history of other malignant neoplasm of large intestine: Secondary | ICD-10-CM | POA: Insufficient documentation

## 2014-06-27 DIAGNOSIS — Z950 Presence of cardiac pacemaker: Secondary | ICD-10-CM | POA: Insufficient documentation

## 2014-06-27 DIAGNOSIS — Z933 Colostomy status: Secondary | ICD-10-CM | POA: Insufficient documentation

## 2014-06-27 DIAGNOSIS — I2510 Atherosclerotic heart disease of native coronary artery without angina pectoris: Secondary | ICD-10-CM | POA: Insufficient documentation

## 2014-06-27 DIAGNOSIS — E785 Hyperlipidemia, unspecified: Secondary | ICD-10-CM | POA: Insufficient documentation

## 2014-06-27 DIAGNOSIS — Z885 Allergy status to narcotic agent status: Secondary | ICD-10-CM | POA: Insufficient documentation

## 2014-06-27 DIAGNOSIS — C569 Malignant neoplasm of unspecified ovary: Secondary | ICD-10-CM | POA: Diagnosis not present

## 2014-06-27 DIAGNOSIS — Z883 Allergy status to other anti-infective agents status: Secondary | ICD-10-CM | POA: Insufficient documentation

## 2014-06-27 DIAGNOSIS — Z79899 Other long term (current) drug therapy: Secondary | ICD-10-CM | POA: Insufficient documentation

## 2014-06-27 HISTORY — PX: CYSTOSCOPY: SHX5120

## 2014-06-27 HISTORY — PX: TRANSURETHRAL RESECTION OF BLADDER TUMOR: SHX2575

## 2014-06-27 SURGERY — TURBT (TRANSURETHRAL RESECTION OF BLADDER TUMOR)
Anesthesia: General

## 2014-06-27 MED ORDER — ATROPINE SULFATE 0.4 MG/ML IJ SOLN
INTRAMUSCULAR | Status: AC
  Filled 2014-06-27: qty 2

## 2014-06-27 MED ORDER — FENTANYL CITRATE 0.05 MG/ML IJ SOLN: 25.0000 ug | INTRAMUSCULAR | Status: DC | PRN

## 2014-06-27 MED ORDER — BELLADONNA ALKALOIDS-OPIUM 16.2-60 MG RE SUPP
RECTAL | Status: AC
  Filled 2014-06-27: qty 1

## 2014-06-27 MED ORDER — SODIUM CHLORIDE 0.9 % IJ SOLN
INTRAMUSCULAR | Status: AC
  Filled 2014-06-27: qty 10

## 2014-06-27 MED ORDER — ONDANSETRON HCL 4 MG/2ML IJ SOLN
INTRAMUSCULAR | Status: AC
  Filled 2014-06-27: qty 2

## 2014-06-27 MED ORDER — LIDOCAINE HCL (CARDIAC) 20 MG/ML IV SOLN
INTRAVENOUS | Status: AC
  Filled 2014-06-27: qty 5

## 2014-06-27 MED ORDER — SODIUM CHLORIDE 0.9 % IR SOLN
3000.0000 mL | Status: DC
  Administered 2014-06-27: 3000 mL

## 2014-06-27 MED ORDER — DILTIAZEM HCL ER COATED BEADS 240 MG PO CP24: 240.0000 mg | ORAL_CAPSULE | Freq: Every morning | ORAL | Status: DC

## 2014-06-27 MED ORDER — ONDANSETRON HCL 4 MG/2ML IJ SOLN: 4.0000 mg | Freq: Once | INTRAMUSCULAR | Status: DC | PRN

## 2014-06-27 MED ORDER — PHENYLEPHRINE HCL 10 MG/ML IJ SOLN
INTRAMUSCULAR | Status: DC | PRN
  Administered 2014-06-27: 40 ug via INTRAVENOUS

## 2014-06-27 MED ORDER — ONDANSETRON HCL 4 MG/2ML IJ SOLN
INTRAMUSCULAR | Status: DC | PRN
  Administered 2014-06-27: 4 mg via INTRAVENOUS

## 2014-06-27 MED ORDER — PROPOFOL 10 MG/ML IV BOLUS
INTRAVENOUS | Status: DC | PRN
  Administered 2014-06-27: 100 mg via INTRAVENOUS

## 2014-06-27 MED ORDER — CEFAZOLIN SODIUM-DEXTROSE 2-3 GM-% IV SOLR
INTRAVENOUS | Status: AC
  Filled 2014-06-27: qty 50

## 2014-06-27 MED ORDER — PHENYLEPHRINE 40 MCG/ML (10ML) SYRINGE FOR IV PUSH (FOR BLOOD PRESSURE SUPPORT)
PREFILLED_SYRINGE | INTRAVENOUS | Status: AC
  Filled 2014-06-27: qty 10

## 2014-06-27 MED ORDER — SODIUM CHLORIDE 0.9 % IR SOLN
Status: DC | PRN
  Administered 2014-06-27 (×2): 6000 mL

## 2014-06-27 MED ORDER — SODIUM CHLORIDE 0.45 % IV SOLN
INTRAVENOUS | Status: DC
  Administered 2014-06-27: 10:00:00 via INTRAVENOUS

## 2014-06-27 MED ORDER — FENTANYL CITRATE 0.05 MG/ML IJ SOLN
INTRAMUSCULAR | Status: DC | PRN
  Administered 2014-06-27 (×2): 25 ug via INTRAVENOUS

## 2014-06-27 MED ORDER — BELLADONNA ALKALOIDS-OPIUM 16.2-60 MG RE SUPP
RECTAL | Status: DC | PRN
  Administered 2014-06-27: 1 via RECTAL

## 2014-06-27 MED ORDER — LIDOCAINE HCL (CARDIAC) 20 MG/ML IV SOLN
INTRAVENOUS | Status: DC | PRN
  Administered 2014-06-27: 50 mg via INTRAVENOUS

## 2014-06-27 MED ORDER — PROPOFOL 10 MG/ML IV BOLUS
INTRAVENOUS | Status: AC
  Filled 2014-06-27: qty 20

## 2014-06-27 MED ORDER — FENTANYL CITRATE 0.05 MG/ML IJ SOLN
INTRAMUSCULAR | Status: AC
  Filled 2014-06-27: qty 2

## 2014-06-27 MED ORDER — LACTATED RINGERS IV SOLN
INTRAVENOUS | Status: DC | PRN
  Administered 2014-06-27: 07:00:00 via INTRAVENOUS

## 2014-06-27 MED ORDER — CEFAZOLIN SODIUM-DEXTROSE 2-3 GM-% IV SOLR
2.0000 g | INTRAVENOUS | Status: AC
  Administered 2014-06-27: 2 g via INTRAVENOUS

## 2014-06-27 MED ORDER — DOCUSATE SODIUM 100 MG PO CAPS
100.0000 mg | ORAL_CAPSULE | Freq: Two times a day (BID) | ORAL | Status: DC
  Administered 2014-06-27: 100 mg via ORAL
  Filled 2014-06-27 (×2): qty 1

## 2014-06-27 MED ORDER — CEPHALEXIN 500 MG PO CAPS
500.0000 mg | ORAL_CAPSULE | Freq: Two times a day (BID) | ORAL | Status: DC
  Administered 2014-06-27: 500 mg via ORAL
  Filled 2014-06-27 (×2): qty 1

## 2014-06-27 MED ORDER — EPHEDRINE SULFATE 50 MG/ML IJ SOLN
INTRAMUSCULAR | Status: AC
  Filled 2014-06-27: qty 1

## 2014-06-27 MED ORDER — LIDOCAINE HCL 2 % EX GEL
CUTANEOUS | Status: AC
  Filled 2014-06-27: qty 10

## 2014-06-27 MED ORDER — ACETAMINOPHEN 325 MG PO TABS: 650.0000 mg | ORAL_TABLET | ORAL | Status: DC | PRN

## 2014-06-27 MED ORDER — 0.9 % SODIUM CHLORIDE (POUR BTL) OPTIME
TOPICAL | Status: DC | PRN
  Administered 2014-06-27: 1000 mL

## 2014-06-27 MED ORDER — BELLADONNA ALKALOIDS-OPIUM 16.2-60 MG RE SUPP: 1.0000 | Freq: Four times a day (QID) | RECTAL | Status: DC | PRN

## 2014-06-27 MED ORDER — ONDANSETRON HCL 4 MG/2ML IJ SOLN: 4.0000 mg | INTRAMUSCULAR | Status: DC | PRN

## 2014-06-27 SURGICAL SUPPLY — 19 items
BAG URINE DRAINAGE (UROLOGICAL SUPPLIES) IMPLANT
BAG URO CATCHER STRL LF (DRAPE) ×3 IMPLANT
CATH FOLEY 3WAY 30CC 22FR (CATHETERS) ×2 IMPLANT
DRAPE CAMERA CLOSED 9X96 (DRAPES) ×3 IMPLANT
ELECT LOOP MED HF 24F 12D CBL (CLIP) ×2 IMPLANT
ELECT REM PT RETURN 9FT ADLT (ELECTROSURGICAL)
ELECTRODE REM PT RTRN 9FT ADLT (ELECTROSURGICAL) ×1 IMPLANT
EVACUATOR MICROVAS BLADDER (UROLOGICAL SUPPLIES) ×2 IMPLANT
GLOVE BIOGEL M 8.0 STRL (GLOVE) ×3 IMPLANT
GOWN STRL REUS W/ TWL XL LVL3 (GOWN DISPOSABLE) ×1 IMPLANT
GOWN STRL REUS W/TWL XL LVL3 (GOWN DISPOSABLE) ×6 IMPLANT
KIT ASPIRATION TUBING (SET/KITS/TRAYS/PACK) ×2 IMPLANT
LOOPS RESECTOSCOPE DISP (ELECTROSURGICAL) ×3 IMPLANT
MANIFOLD NEPTUNE II (INSTRUMENTS) ×3 IMPLANT
PACK CYSTO (CUSTOM PROCEDURE TRAY) ×3 IMPLANT
SYRINGE IRR TOOMEY STRL 70CC (SYRINGE) IMPLANT
TUBING CONNECTING 10 (TUBING) ×2 IMPLANT
TUBING CONNECTING 10' (TUBING) ×1
WATER STERILE IRR 3000ML UROMA (IV SOLUTION) ×3 IMPLANT

## 2014-06-27 NOTE — Transfer of Care (Signed)
Immediate Anesthesia Transfer of Care Note  Patient: Victoria [name]  Procedure(s) Performed: Procedure(s): TRANSURETHRAL RESECTION OF BLADDER TUMOR (TURBT) (N/A) CYSTOSCOPY (N/A)  Patient Location: PACU  Anesthesia Type:General  Level of Consciousness: awake, alert , oriented and patient cooperative  Airway & Oxygen Therapy: Patient Spontanous Breathing and Patient connected to face mask oxygen  Post-op Assessment: Report given to PACU RN, Post -op Vital signs reviewed and stable and Patient moving all extremities  Post vital signs: Reviewed and stable  Complications: No apparent anesthesia complications

## 2014-06-27 NOTE — Progress Notes (Signed)
Peripad put in place

## 2014-06-27 NOTE — Anesthesia Postprocedure Evaluation (Signed)
  Anesthesia Post-op Note  Patient: Victoria [name]  Procedure(s) Performed: Procedure(s) (LRB): TRANSURETHRAL RESECTION OF BLADDER TUMOR (TURBT) (N/A) CYSTOSCOPY (N/A)  Patient Location: PACU  Anesthesia Type: General  Level of Consciousness: awake and alert   Airway and Oxygen Therapy: Patient Spontanous Breathing  Post-op Pain: mild  Post-op Assessment: Post-op Vital signs reviewed, Patient's Cardiovascular Status Stable, Respiratory Function Stable, Patent Airway and No signs of Nausea or vomiting  Last Vitals:  Filed Vitals:   06/27/14 0545  BP: 123/71  Pulse: 60  Temp: 36.3 C  Resp: 16    Post-op Vital Signs: stable   Complications: No apparent anesthesia complications

## 2014-06-27 NOTE — Plan of Care (Signed)
Problem: Consults Goal: TURP/TURBT Patient Education (See Patient Education module for education specifics.) Outcome: Completed/Met Date Met:  06/27/14 Goal: Skin Care Protocol Initiated - if Braden Score 18 or less If consults are not indicated, leave blank or document N/A Outcome: Not Applicable Date Met:  82/42/35 Goal: Nutrition Consult-if indicated Outcome: Not Applicable Date Met:  36/14/43 Goal: Diabetes Guidelines if Diabetic/Glucose > 140 If diabetic or lab glucose is > 140 mg/dl - Initiate Diabetes/Hyperglycemia Guidelines & Document Interventions  Outcome: Not Applicable Date Met:  15/40/08  Problem: Phase I Progression Outcomes Goal: Pain controlled with appropriate interventions Outcome: Completed/Met Date Met:  06/27/14 Goal: Bedrest x 6 hrs for spinal anesthesia Outcome: Not Applicable Date Met:  67/61/95 Goal: No urinary obstruction (foley to traction) Outcome: Completed/Met Date Met:  06/27/14 Goal: Initial discharge plan identified Outcome: Completed/Met Date Met:  06/27/14 Goal: Tubes/drains patent Outcome: Completed/Met Date Met:  06/27/14 Goal: Continue bladder and/or hand irrigation Outcome: Completed/Met Date Met:  06/27/14 Goal: Hemodynamically stable Outcome: Completed/Met Date Met:  06/27/14 Goal: Tolerating diet Outcome: Completed/Met Date Met:  06/27/14 Goal: No symptoms of Post-TURP Syndrome Outcome: Completed/Met Date Met:  06/27/14 Goal: Other Phase I Outcomes/Goals Outcome: Completed/Met Date Met:  06/27/14

## 2014-06-27 NOTE — Progress Notes (Signed)
Dr. Diona Fanti  Notified of patient having frequent clear-colored bubbly  Drainage  From perineum.

## 2014-06-27 NOTE — H&P (Signed)
H&P  Chief Complaint: Blood in urine  History of Present Illness: Victoria [name] is a 80 y.o. year old female who presents for TUR-BT of a large, fungating bladder tumor, felt to be an extension  Of contiguous bladder cancer.  Her detailed history follows:    Victoria [name] is an 80 year old female who presented with a bowel obstruction and a large right sided 8 cm right pelvic mass in August 2013 requiring surgical intervention including colostomy and gastrostomy tube placement. She underwent a biopsy of the mass with findings indicating carcinoma and immunohistochemical stains suggesting positivity for cytokeratin 7, estrogen receptor, and WT-1 consistent with a gynecologic primary (likely ovarian) malignancy. She was treated by Dr. Benay Spice with 4 cycles of Taxol and carboplatin which resulted in normalization of her CA-125 and an excellent radiologic response. This was followed by evaluation and surgical therapy by Dr. Alycia Rossetti in March 2014 which included bilateral oophorectomy and omentectomy. During surgical consolidation, she was noted to have a very small amount of residual disease consistent with ovarian serous carcinoma. She was disease free with no evidence of serologic recurrence until January 2015 when she began having gross hematuria. She presented to Dr. Junious Silk and was found to have a large right sided bladder mass encompassing the majority of the right anterior bladder. She underwent a TUR biopsy and palliative resection to stop her hematuria and for further diagnostic purposes. Pathology demonstrated a high grade, poorly differentiated carcinoma and initially it was read as a probable urothelial tumor. However, further immunohistochemical stains revealed that the tumor was positive for cytokeratin 7 and WT-1 consistent with recurrent ovarian cancer. Her hematuria has now stopped. She has seen Dr. Benay Spice and has elected to avoid further systemic chemotherapy treatment. She also did not wish to  proceed with a radical cystectomy. She has been evaluated by Dr. Sondra Come and has received palliative radiation therapy (48Gy) to the pelvis completed in March 2015.   Over the past few months, she has had intermittent gross hematuria without pain. She has had, at times, frequent urination with urgency. Overactive bladder medications did not really help, but her symptoms actually resolved with time. She is not currently having any pain, but she has frequent gross painless hematuria sometimes with clots. She has had no fever or abdominal pain. She has not had abdominal cross sectional imaging in about 9 months. Except for her hematuria, she is relatively asymptomatic. Cysto in the office recently revealed the large bladder tumor.   Past Medical History  Diagnosis Date  . Hyperlipidemia   . Diverticulosis   . Allergy   . Anxiety     rhematoid  . Colostomy in place   . PONV (postoperative nausea and vomiting)   . Chronic anticoagulation   . Fluid collection (edema) in the arms, legs, hands and feet   . Urinary frequency     with burning x 2 days  . History of blood transfusion   . Hypertension   . Pacemaker   . Coronary artery disease     DR. CROITORU IS PT'S CARDIOLOGIST  . Atrial fibrillation   . Sinus node dysfunction 09/10/2008    Pacemaker [employer]  . RBBB   . PAT (paroxysmal atrial tachycardia)   . Urinary frequency   . Rectal carcinoma   . Colon cancer 03/13/12 egd    rectal mass5cm  , 2-3cm proximal to anal verge  . Bladder cancer   . Ovarian cancer   . Anemia     Past  Surgical History  Procedure Laterality Date  . Permanent pacemaker insertion  09/10/2008    [employer]  . Rib removed  1934    Pleurisy and pneumonia  . Partial hysterectomy  1972  . Bladder neck suspension  1986  . Heart catherization  [id number]  . Abdominal hysterectomy      both ovaries intact,   . Colon resection  04/04/2012    Procedure: COLON RESECTION;  Surgeon: Rolm Bookbinder, MD;  Location: [facility]  [facility];  Service: General;  Laterality: N/A;  laparotomy with small bowel resection for obstruction with colostomy with gastrostomy tube placement.   . Portacath placement  05/04/2012    Procedure: INSERTION PORT-A-CATH;  Surgeon: Rolm Bookbinder, MD;  Location: [facility];  Service: General;  Laterality: Right;  . Insert / replace / remove pacemaker  2/10  . Laparotomy Bilateral 10/03/2012    Procedure: EXPLORATORY LAPAROTOMY;  Surgeon: Imagene Gurney A. Alycia Rossetti, MD;  Location: [facility];  Service: Gynecology;  Laterality: Bilateral;  WITH BSO, TUMOR DEBULKING  . Colostomy revision N/A 10/03/2012    Procedure: COLOSTOMY REVISION;  Surgeon: Rolm Bookbinder, MD;  Location: [facility];  Service: General;  Laterality: N/A;  COLOSTOMY REVISION, Sigmoid colectomy  . US echocardiography  07/17/2008    EF =>55%,mild mitral annular ca+,mild-mod MR,TR,AOV mildly sclerotic  . Nm myoview ltd  07/17/2008    no ischemia  . Port-a-cath removal N/A 02/21/2013    Procedure: REMOVAL PORT-A-CATH;  Surgeon: Rolm Bookbinder, MD;  Location: [facility];  Service: General;  Laterality: N/A;  . Transurethral resection of bladder tumor with gyrus (turbt-gyrus) N/A 09/05/2013    Procedure: TRANSURETHRAL RESECTION OF BLADDER TUMOR WITH GYRUS (TURBT-GYRUS);  Surgeon: Fredricka Bonine, MD;  Location: [facility];  Service: Urology;  Laterality: N/A;    Home Medications:  Medications Prior to Admission  Medication Sig Dispense Refill  . diltiazem (CARDIZEM CD) 240 MG 24 hr capsule Take 240 mg by mouth every morning.    . iron polysaccharides (NIFEREX) 150 MG capsule Take 1 capsule (150 mg total) by mouth daily. 30 capsule 6  . lactose free nutrition (BOOST) LIQD Take 237 mLs by mouth daily.      Allergies:  Allergies  Allergen Reactions  . Pindolol Nausea Only and Swelling    Throat swelled up  . Iodine Other (See Comments)    BLISTERS  . Shellfish Allergy Diarrhea and Nausea And Vomiting  . Vibramycin [Doxycycline Calcium] Nausea Only  and Other (See Comments)    dizziness  . Doxycycline Nausea Only  . Levofloxacin Other (See Comments)    DIZZINESS  . Multaq [Dronedarone] Other (See Comments)    CAUSED SEVERE BURNING GI TRACT  . Vicodin [Hydrocodone-Acetaminophen] Other (See Comments)    dizziness    Family History  Problem Relation Age of Onset  . Heart disease Mother     Social History:  reports that she has never smoked. She has never used smokeless tobacco. She reports that she does not drink alcohol or use illicit drugs.  ROS: A complete review of systems was performed.  All systems are negative except for pertinent findings as noted.  Physical Exam:  Vital signs in last 24 hours: Temp:  [97.4 F (36.3 C)] 97.4 F (36.3 C) (12/03 0545) Pulse Rate:  [60] 60 (12/03 0545) Resp:  [16] 16 (12/03 0545) BP: (123)/(71) 123/71 mmHg (12/03 0545) SpO2:  [97 %] 97 % (12/03 0545) General:  Alert and oriented, No acute distress HEENT: Normocephalic, atraumatic Neck: No JVD or lymphadenopathy  Cardiovascular: Regular rate and rhythm Lungs: Clear bilaterally Abdomen: Soft, nontender, nondistended, no abdominal masses Back: No CVA tenderness Extremities: No edema Neurologic: Grossly intact  Laboratory Data:  No results found for this or any previous visit (from the past 24 hour(s)). No results found for this or any previous visit (from the past 240 hour(s)). Creatinine:  Recent Labs  06/25/14 0930  CREATININE 0.99    Radiologic Imaging: No results found.  Impression/Assessment:  Bladder tumor > 5 cm, most likely progression of ovarian carcinoma  Plan:  TUR-BT  Jorja Loa 06/27/2014, 6:03 AM  Lillette Boxer. Kamyrah Feeser MD

## 2014-06-27 NOTE — Op Note (Signed)
Preoperative diagnosis: Large bladder tumor, greater than 5 cm, most likely of ovarian source  Postoperative diagnosis: Same   Procedure: Cystoscopy, TURBT of greater than 5 cm bladder tumor    Surgeon: Lillette Boxer. Kimm Ungaro, M.D.   Anesthesia: Gen.   Complications: None  Specimen(s): Bladder tumor fragments, to pathology  Drain(s): 62 French Foley catheter, three-way, to CBI  Indications: 79 year old female with progressive ovarian cancer. She has gross hematuria and has a history of a resection of a bladder tumor with pathology revealing ovarian primary. The patient presents at this time with recurrent bleeding and a recurrent greater than 5 cm bladder mass. We have discussed palliative resection, as well as the fact that this will likely recurrent some point. As she is minimally symptomatic other than gross hematuria, palliative resection is to be performed.    Technique and findings: The patient was identified in the holding area. She received preoperative IV antibiotics. She was taken to the operating room where general anesthetic was administered. She was placed in the dorsolithotomy position. Genitalia and perineum were prepped and draped. Proper timeout was performed.  35 French panendoscope was advanced in her bladder. Inspection was performed. Ureteral orifices were normal in configuration and location. Other than a fungating tumor at the right dome of the bladder slightly posteriorly, the bladder urothelium was normal.  The 28th French resectoscope sheath was placed using the obturator. The cutting loop was then attached. Resection was then begun. I started resection anteriorly, caring dissection down in a posterior direction, and staying circumferentially first. The large lobular tumor was then systematically resected. It was evident that the tumor penetrated deep in the bladder, possibly outside the normal bladder wall. I took care not to carry the resection to deep, fearing  perforation. I did an adequate resection, taking the tumor down to a flat surface. A large volume of tumor was resected. Following resection, I used the loop to cauterize, first the edges of the normal bladder mucosa, then carrying this centrally towards the resected tumor. Following resection, there was no bleeding. There was no fungating mass remaining. The bladder tumor fragments were irrigated from the bladder. I then inspected the resected surface, and no bleeding was noted. At this point, the scope was removed. I then placed a 22 [place], three-way Foley catheter. This was hooked to CBI with normal saline. The patient was awakened and taken to the PACU in stable condition. She tolerated the procedure well.

## 2014-06-27 NOTE — Progress Notes (Signed)
Assumed care of patient from previous RN.  Agree with previous RN's assessment of patient. Patient resting comfortably in bed.  Will continue to monitor.

## 2014-06-28 ENCOUNTER — Encounter (HOSPITAL_COMMUNITY): Payer: Self-pay | Admitting: Urology

## 2014-06-28 DIAGNOSIS — C7911 Secondary malignant neoplasm of bladder: Secondary | ICD-10-CM | POA: Diagnosis not present

## 2014-06-28 MED ORDER — CEPHALEXIN 500 MG PO CAPS: 500.0000 mg | ORAL_CAPSULE | Freq: Two times a day (BID) | ORAL | Status: DC

## 2014-06-28 NOTE — Progress Notes (Signed)
Pt was D/C home with family in stable condition. Pt was D/C with foley and stated that she will attend her follow up appointment on Monday.

## 2014-06-28 NOTE — Discharge Instructions (Signed)

## 2014-06-28 NOTE — Plan of Care (Signed)
Problem: Phase II Progression Outcomes Goal: Pain controlled Outcome: Completed/Met Date Met:  06/28/14 Goal: OOB/Ambulate TID Outcome: Progressing Goal: Discharge plan established Outcome: Completed/Met Date Met:  06/28/14 Goal: Urinary output increased Outcome: Completed/Met Date Met:  06/28/14 Goal: Tolerating diet Outcome: Completed/Met Date Met:  06/28/14 Goal: Hemodynamically stable Outcome: Completed/Met Date Met:  06/28/14 Goal: Other Phase II Outcomes/Goals Outcome: Not Applicable Date Met:  19/47/12

## 2014-07-03 ENCOUNTER — Other Ambulatory Visit: Payer: Self-pay | Admitting: *Deleted

## 2014-07-03 ENCOUNTER — Telehealth: Payer: Self-pay | Admitting: Oncology

## 2014-07-04 ENCOUNTER — Telehealth: Payer: Self-pay | Admitting: Oncology

## 2014-07-04 ENCOUNTER — Encounter: Payer: Self-pay | Admitting: Oncology

## 2014-07-04 ENCOUNTER — Ambulatory Visit (HOSPITAL_BASED_OUTPATIENT_CLINIC_OR_DEPARTMENT_OTHER): Payer: Medicare Other | Admitting: Oncology

## 2014-07-04 VITALS — BP 145/70 | HR 69 | Temp 97.7°F | Resp 18 | Ht 61.0 in | Wt 144.1 lb

## 2014-07-04 DIAGNOSIS — C569 Malignant neoplasm of unspecified ovary: Secondary | ICD-10-CM

## 2014-07-04 DIAGNOSIS — C561 Malignant neoplasm of right ovary: Secondary | ICD-10-CM

## 2014-07-04 DIAGNOSIS — C7989 Secondary malignant neoplasm of other specified sites: Secondary | ICD-10-CM

## 2014-07-04 NOTE — Telephone Encounter (Signed)
Pt confirmed labs/ov per 12/10 POF, gave pt AVS.... KJ

## 2014-07-04 NOTE — Progress Notes (Signed)
[place] OFFICE PROGRESS NOTE   Diagnosis: Ovarian cancer  INTERVAL HISTORY:   Victoria [name] returns as scheduled. She saw Dr. Diona Fanti for evaluation of hematuria. A cystoscopy on 06/19/2014 revealed a 5.5 cm tumor  at the dome of the bladder. She was taken to the operating room for a transurethral resection on 06/27/2014. The large lobular tumor was resected.  The pathology [id number]) revealed a high-grade poorly differentiated carcinoma similar to the previous transurethral resection from February 2015.  Victoria [name] reports no hematuria since surgery. She feels well. She is able to go about her usual activities. Objective:  Vital signs in last 24 hours:  Blood pressure 145/70, pulse 69, temperature 97.7 F (36.5 C), temperature source Oral, resp. rate 18, height 5\' 1"  (1.549 m), weight 144 lb 1.6 oz (65.363 kg).    HEENT: Neck without mass Lymphatics: No cervical, supra-clavicular, axillary, or inguinal nodes Resp: Lungs decreased breath sounds throughout the right chest, clear bilaterally Cardio: Regular rate and rhythm GI: No hepatomegaly, left lower quadrant colostomy, no mass Vascular: No leg edema   Lab Results:  Lab Results  Component Value Date   WBC 4.3 06/25/2014   HGB 12.8 06/25/2014   HCT 38.6 06/25/2014   MCV 95.1 06/25/2014   PLT 185 06/25/2014   NEUTROABS 2.9 03/19/2014    Medications: I have reviewed the patient's current medications.  Assessment/Plan: 1. Ovarian cancer-presenting with a rectal mass , status post sigmoidoscopy 03/13/2012 with findings of a 5 cm malignant appearing mass with central ulceration in the rectum 2-3 cm proximal to the anal verge. Pathology showed moderate to poorly differentiated adenocarcinoma with ulceration and prolapse changes. By immunohistochemistry the malignant cells were positive for cytokeratin 7, estrogen receptor and WT-1. They were negative for cytokeratin 20 and CDX-2. This immunohistochemical  profile was strongly suggestive of a gynecologic primary. -Status post 3 cycles of Taxol/carboplatin chemotherapy with normalization of the CA 125, restaging CT 08/07/2012 with marked improvement in the pelvic masses and no evidence of progressive ovarian cancer. She completed cycle 4 of Taxol/carboplatin beginning 08/08/2012. 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 She completed cycle 6 Taxol/carboplatin beginning 11/21/2012. 2. History of Atrial fibrillation  3. Status post Port-A-Cath placement 05/04/2012. Port-A-Cath removal 02/21/2013 4. 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 5. Admission 09/02/2013 with gross hematuria-right pelvic mass with involvement of the bladder confirmed on CT with a cystoscopy confirming a large tumor in the bladder with associated bleeding, status post TURBT and fulguration, pathology consistent with a high-grade carcinoma. Immunohistochemical stains returned positive for cytokeratin 7 and WT-1, and negative for cytokeratin 20 and cytokeratin 903. She completed a course of palliative radiation 09/13/2013 through 10/22/2013.  Status post transurethral resection of a greater than 5 cm tumor at the right dome of the bladder 06/27/2014, pathology confirmed high-grade poorly differentiated carcinoma 6. History of anemia secondary to bleeding. She continues iron.  Disposition:  Victoria [name] has been diagnosed with recurrent carcinoma involving a bladder mass. This explains the recent gross hematuria. She appears to have recurrent ovarian cancer.  I discussed treatment options with [name]. She understands the tumor will likely progress in the bladder and potentially elsewhere. There is a high chance of a response with systemic therapy. She does not wish to receive further therapy or testing at present.  Victoria [name] will contact Dr. Diona Fanti for recurrent bleeding. She will  return for an office visit here in 3 months.  [name], MD  07/04/2014  4:27 PM

## 2014-07-10 NOTE — Discharge Summary (Signed)
Patient ID: Victoria [name] MRN: [id number] DOB/AGE: 09/28/1926 80 y.o.  Admit date: 06/27/2014 Discharge date: 07/10/2014  Primary Care Physician:  Victoria Lopes, MD  Discharge Diagnoses:   Present on Admission:  . Cancer of dome of urinary bladder  Consults:  None     Discharge Medications:   Medication List    TAKE these medications        diltiazem 240 MG 24 hr capsule  Commonly known as:  CARDIZEM CD  Take 240 mg by mouth every morning.     iron polysaccharides 150 MG capsule  Commonly known as:  NIFEREX  Take 1 capsule (150 mg total) by mouth daily.     lactose free nutrition Liqd  Take 237 mLs by mouth daily.         Significant Diagnostic Studies:  No results found.  Brief H and P: For complete details please refer to admission H and P, but in brief the patient was admitted for palliative resection of a fungating mass at the dome of her bladder, most likely secondary to ovarian primary.  Hospital Course:  Active Problems:   Cancer of dome of urinary bladder  [name] tolerated her TURBT quite well. She was discharged on postoperative day #1 after catheter had been removed and she voided adequately.  Day of Discharge BP 95/71 mmHg  Pulse 60  Temp(Src) 98 F (36.7 C) (Oral)  Resp 18  Ht 5\' 1"  (1.549 m)  Wt 65.7 kg (144 lb 13.5 oz)  BMI 27.38 kg/m2  SpO2 93%  No results found for this or any previous visit (from the past 24 hour(s)).  Physical Exam: General: Alert and awake oriented x3 not in any acute distress. HEENT: anicteric sclera, pupils reactive to light and accommodation CVS: S1-S2 clear no murmur rubs or gallops Chest: clear to auscultation bilaterally, no wheezing rales or rhonchi Abdomen: soft nontender, nondistended, normal bowel sounds, no organomegaly Extremities: no cyanosis, clubbing or edema noted bilaterally Neuro: Cranial nerves II-XII intact, no focal neurological deficits  Disposition:  Home  Diet:  No  restrictions  Activity:  Discussed with patient   Disposition and Follow-up:     Discharge Instructions    Discharge patient    Complete by:  As directed             I will call with follow-up and pathology results  TESTS THAT NEED FOLLOW-UP  Pathology results  DISCHARGE FOLLOW-UP Follow-up Information    Follow up with Victoria Loa, MD.   Specialty:  Urology   Why:  as scheduled   Contact information:   [address] [place] [address] [phone]       Time spent on Discharge:  15 minutes  Signed: Jorja [name] 07/10/2014, 7:50 PM

## 2014-07-25 IMAGING — CR DG ABDOMEN ACUTE W/ 1V CHEST
3 series · 3 of 3 positions shown · non-contrast
Comparison: Chest radiograph 03/20/2012
Correlation:  CT abdomen from PET CT 03/22/2012

CLINICAL DATA: Rectal mass, nausea, vomiting, abdominal pain and
distention

ACUTE ABDOMEN SERIES (ABDOMEN 2 VIEW & CHEST 1 VIEW)

[w chest pa]
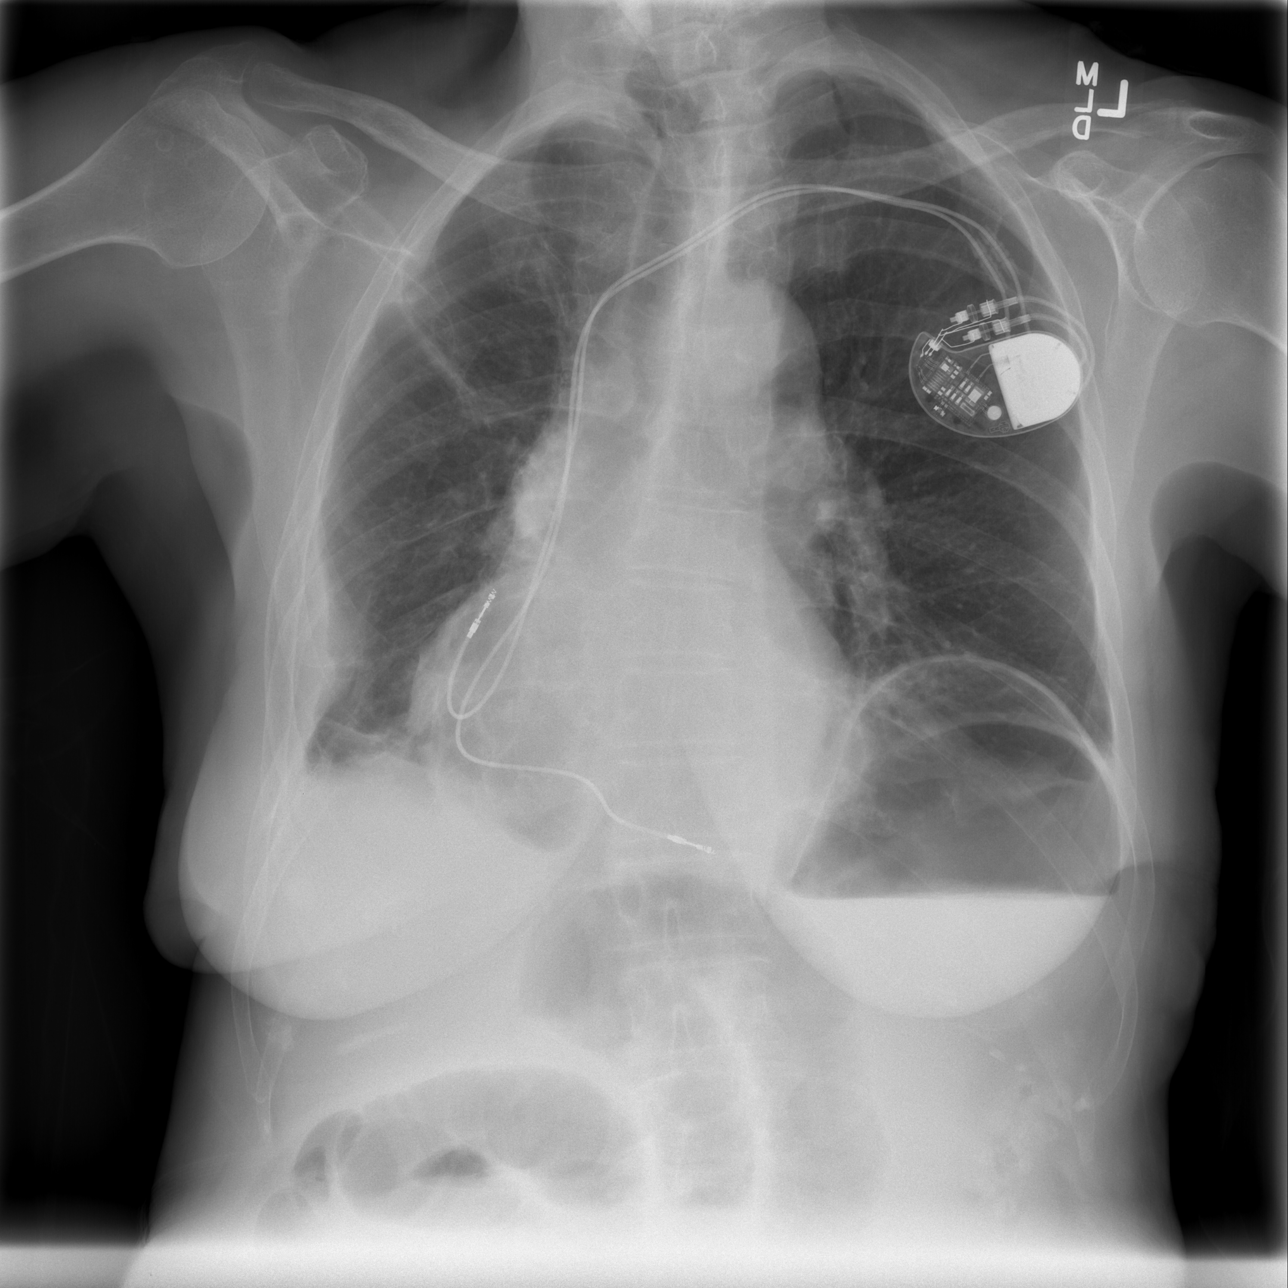

[w abdomen upright *]
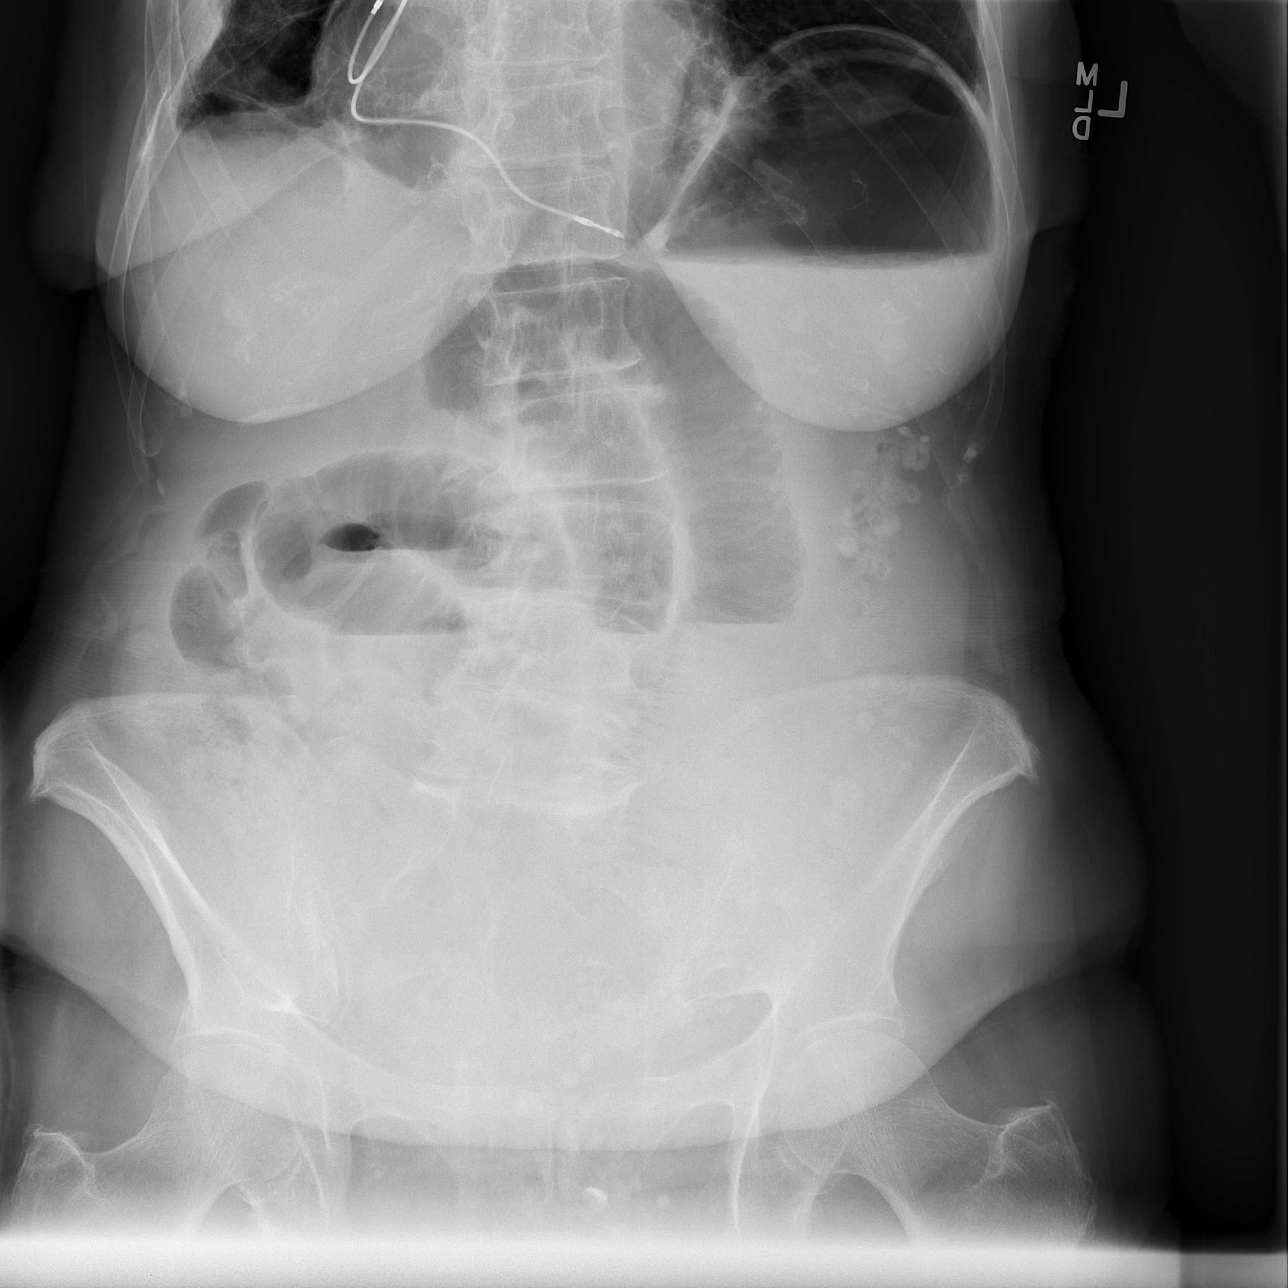

[t abdomen supine]
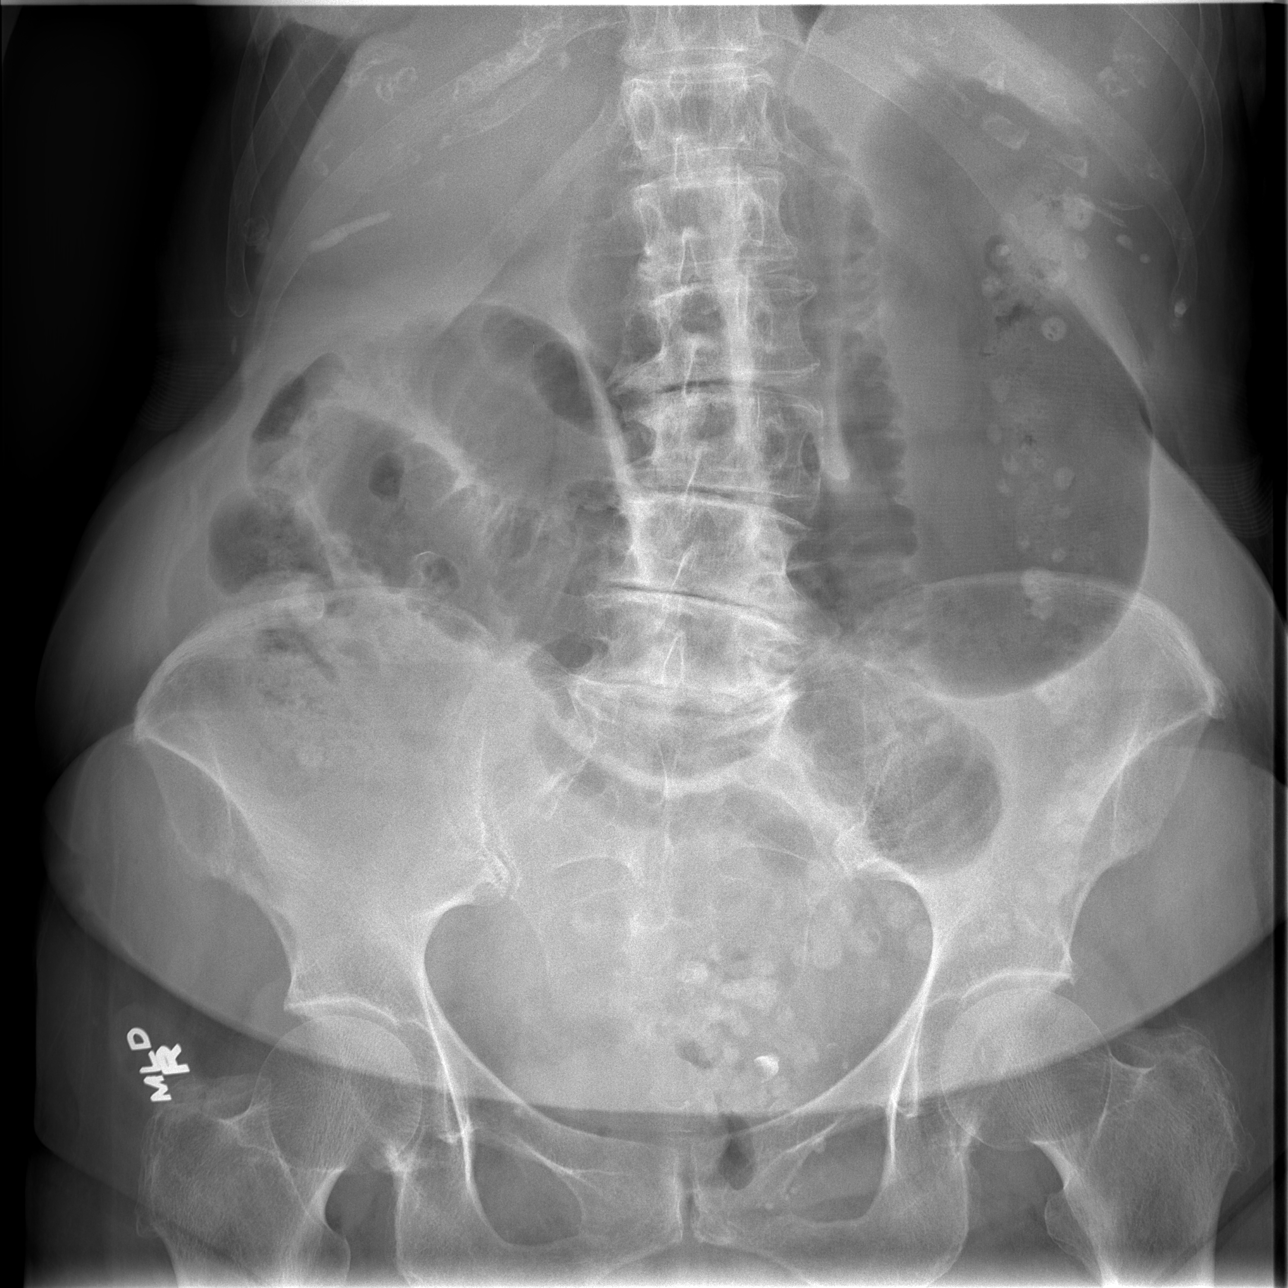

[3 of 3 positions shown; findings below may reference images not displayed]

FINDINGS: Left subclavian sequential transvenous pacemaker leads project at
right atrium and right ventricle.
Enlargement of cardiac silhouette.
Tortuous aorta.
Mediastinal contours and pulmonary vascularity normal.
Eventration left diaphragm.
Emphysematous changes with right upper lobe scarring.
Increased right pleural effusion and basilar atelectasis.
No definite pulmonary infiltrate or pneumothorax.
Diffuse osseous demineralization.

Dilated small bowel loops with distention of the stomach with
prominent air fluid level.
Findings compatible with small bowel obstruction.
Colon decompressed with multiple diverticula at the descending and
sigmoid colon.
No definite bowel wall thickening.
On the upright view there is questionably free air under the left
hemidiaphragm though this could be an artifact secondary to stomach
riding under a focal eventration.
Thoracolumbar scoliosis.
No definite urinary tract calcification.
IMPRESSION: Increased right pleural effusion and basilar atelectasis.
Gastric and proximal small bowel loops distention consistent with
small bowel obstruction.
Colonic diverticulosis.
Small bowel distention has increased since recent PET CT exam.
Questionable free air under the left diaphragm versus artifact from
eventration; recommend left lateral decubitus abdominal radiograph
to exclude free intraperitoneal air.

Critical Value/emergent results were called by telephone at the
time of interpretation on 03/24/2012 at 8066 hours to patient's
nurse Adarsh RN on 3 [address], who verbally acknowledged these
results.

## 2014-07-28 IMAGING — CR DG ABDOMEN 2V
2 series · 2 of 2 positions shown · non-contrast
Comparison: the previous day's study

CLINICAL DATA: Small bowel obstruction

ABDOMEN - 2 VIEW

[w abdomen upright]
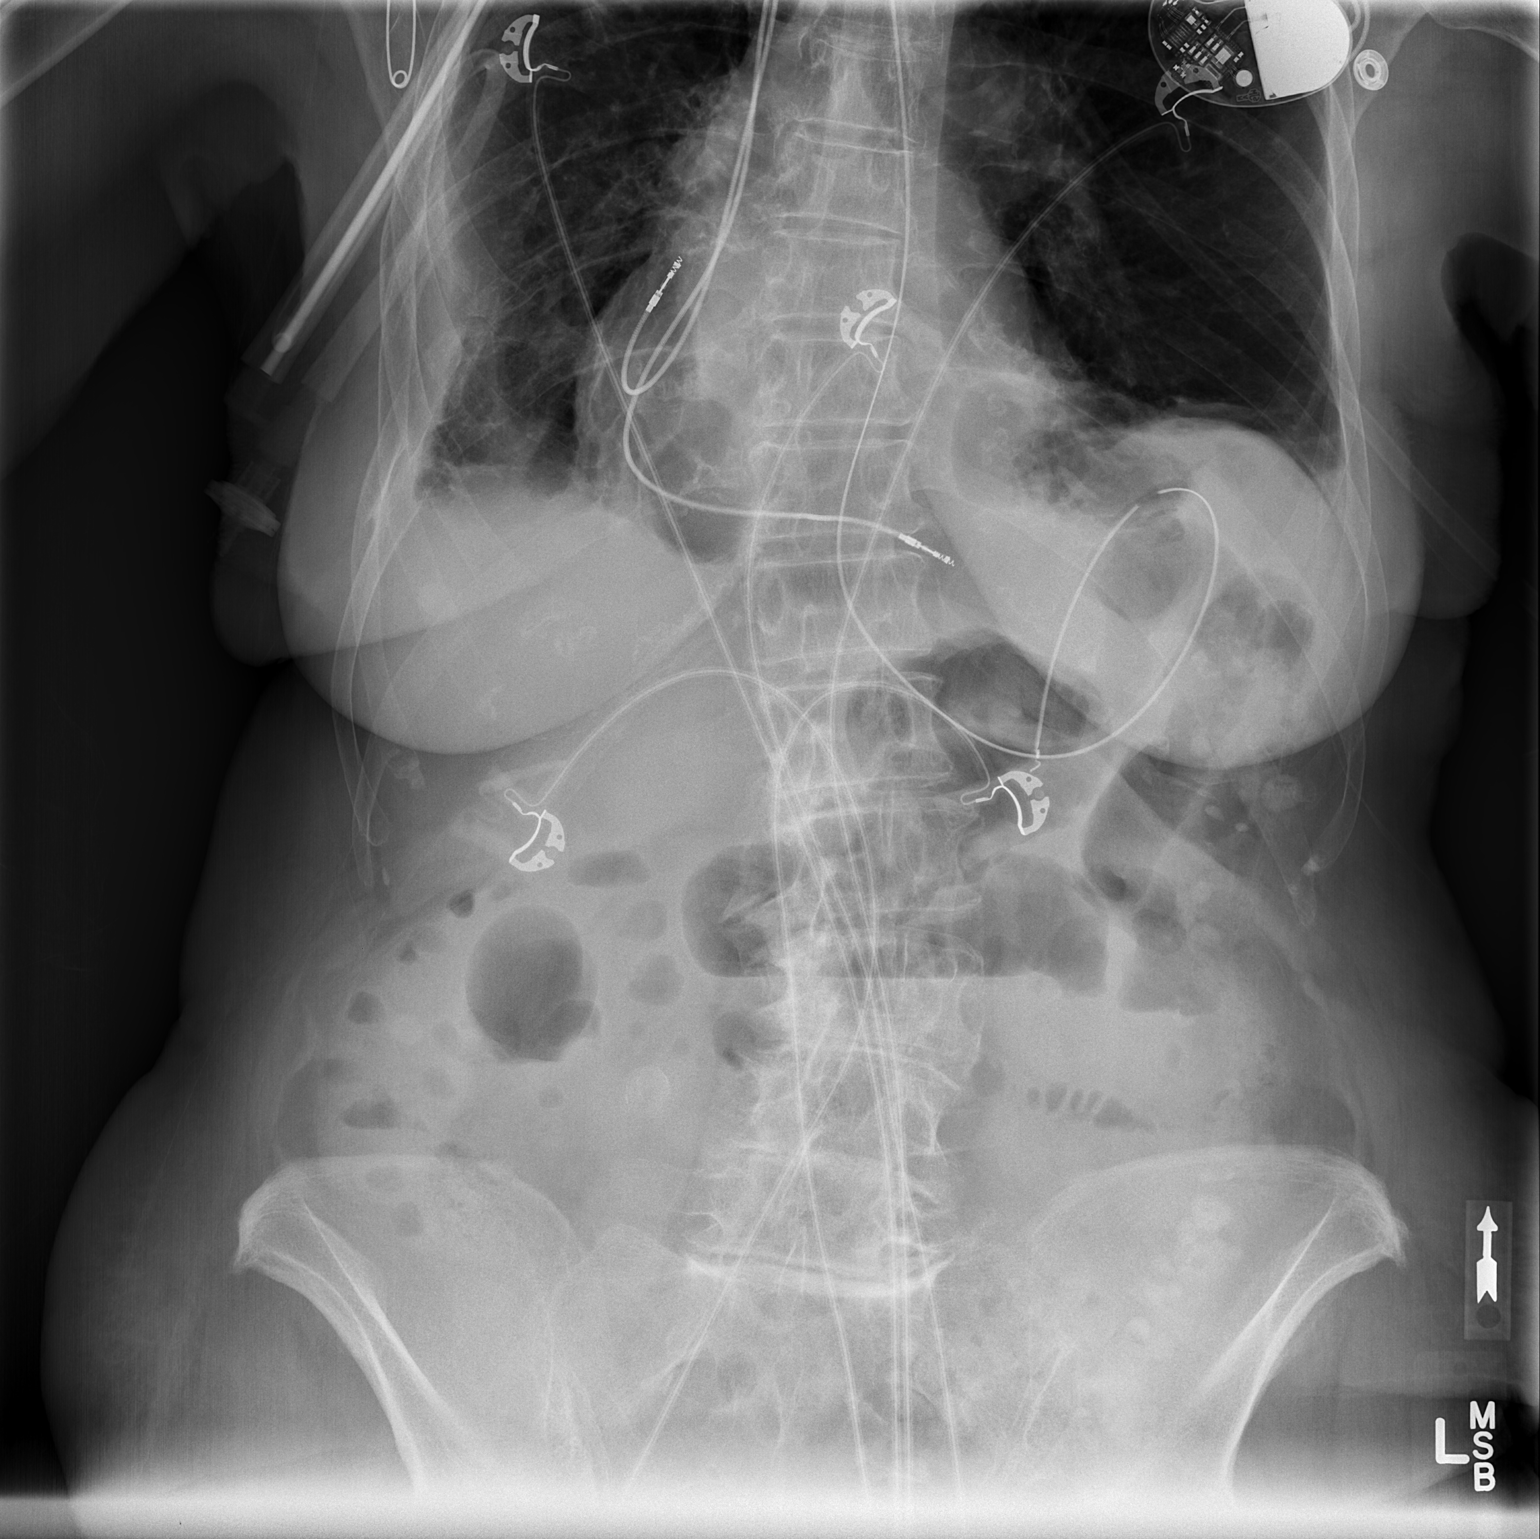

[t abdomen supine]
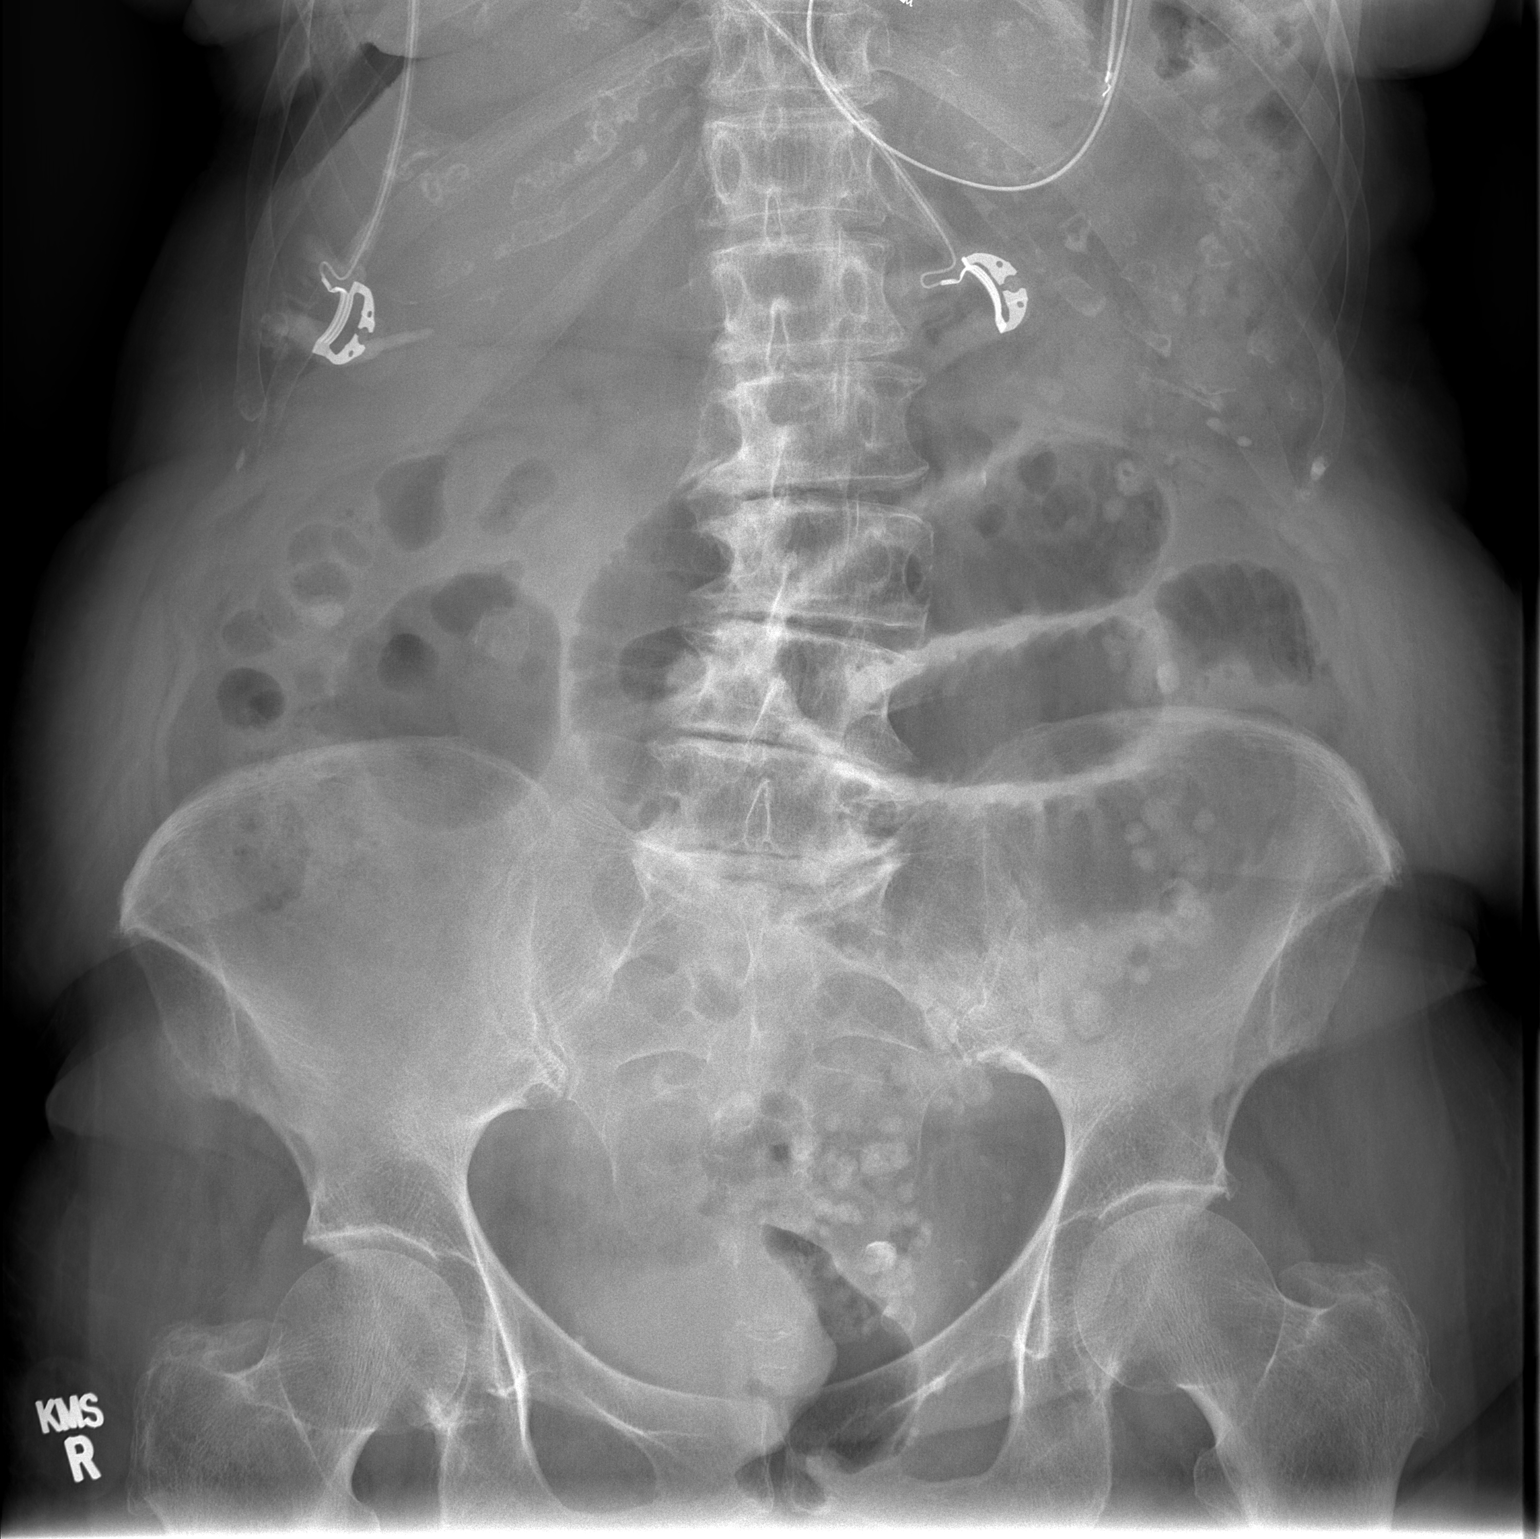

[2 of 2 positions shown; findings below may reference images not displayed]

FINDINGS: Nasogastric tube loops in the decompressed stomach.
Transvenous pacing leads are partially seen.  There are a few gas
dilated mid abdominal small bowel loops, with fluid levels on the
erect radiograph, stable in number and size since previous exam.
No free air.  Normal distribution of gas and stool throughout the
colon.  Residual oral contrast opacifies multiple descending and
sigmoid diverticula.  Multilevel degenerative changes in the lumbar
spine.
IMPRESSION: 1.  Persistent small bowel obstruction, with nasogastric tube in
place.
2.  No free air.

## 2014-08-08 ENCOUNTER — Encounter (HOSPITAL_COMMUNITY): Payer: Self-pay | Admitting: Cardiovascular Disease

## 2014-09-10 ENCOUNTER — Ambulatory Visit: Payer: Medicare Other | Admitting: Oncology

## 2014-09-10 ENCOUNTER — Other Ambulatory Visit: Payer: Medicare Other

## 2014-09-12 ENCOUNTER — Other Ambulatory Visit: Payer: Self-pay | Admitting: Cardiovascular Disease

## 2014-09-12 NOTE — Telephone Encounter (Signed)
Rx(s) sent to pharmacy electronically.  

## 2014-09-24 ENCOUNTER — Ambulatory Visit (INDEPENDENT_AMBULATORY_CARE_PROVIDER_SITE_OTHER): Payer: Medicare Other | Admitting: Cardiovascular Disease

## 2014-09-24 ENCOUNTER — Encounter: Payer: Self-pay | Admitting: Cardiovascular Disease

## 2014-09-24 VITALS — BP 126/78 | HR 98 | Resp 16 | Ht 61.0 in | Wt 146.3 lb

## 2014-09-24 DIAGNOSIS — I480 Paroxysmal atrial fibrillation: Secondary | ICD-10-CM

## 2014-09-24 DIAGNOSIS — I471 Supraventricular tachycardia: Secondary | ICD-10-CM

## 2014-09-24 DIAGNOSIS — R310 Gross hematuria: Secondary | ICD-10-CM

## 2014-09-24 DIAGNOSIS — Z950 Presence of cardiac pacemaker: Secondary | ICD-10-CM

## 2014-09-24 LAB — MDC_IDC_ENUM_SESS_TYPE_INCLINIC
Battery Voltage: 2.78 V
Brady Statistic RA Percent Paced: 79 %
Brady Statistic RV Percent Paced: 1.1 %
Implantable Pulse Generator Serial Number: 2195755
Lead Channel Impedance Value: 373 Ohm
Lead Channel Impedance Value: 387 Ohm
Lead Channel Pacing Threshold Amplitude: 1.25 V
Lead Channel Pacing Threshold Pulse Width: 0.4 ms
Lead Channel Sensing Intrinsic Amplitude: 1.2 mV
Lead Channel Sensing Intrinsic Amplitude: 4.9 mV
Lead Channel Setting Pacing Amplitude: 2 V
Lead Channel Setting Sensing Sensitivity: 1.5 mV
MDC IDC SET LEADCHNL RV PACING AMPLITUDE: 2.5 V
MDC IDC SET LEADCHNL RV PACING PULSEWIDTH: 0.4 ms

## 2014-09-24 MED ORDER — DILTIAZEM HCL ER COATED BEADS 360 MG PO CP24
360.0000 mg | ORAL_CAPSULE | Freq: Every day | ORAL | Status: DC
Start: 2014-09-24 — End: 2015-10-21

## 2014-09-24 NOTE — Patient Instructions (Addendum)
INCREASE Diltiazem to 360mg  daily.  Dr. Sallyanne Kuster recommends that you schedule a follow-up appointment in: 3 months with pacemaker check ([employer]).

## 2014-09-30 ENCOUNTER — Encounter: Payer: Self-pay | Admitting: Cardiovascular Disease

## 2014-09-30 NOTE — Progress Notes (Signed)
Patient ID: Victoria [name], female   DOB: April 19, 1927, 81 y.o.   MRN: [id number]     Cardiology Office Note   Date:  09/30/2014   ID:  Victoria [name], DOB 1926/08/28, MRN [id number]  PCP:  Donnajean Lopes, MD  Cardiologist:   Sanda Klein, MD   Chief Complaint  Patient presents with  . ROV 6 months    patient reports sweats-several occas where she has woken up with sweats      History of Present Illness: Victoria [name] is a 81 y.o. female who presents for recurrent persistent atrial flutter that was difficult to rate control when she had problems with bowel obstruction related to pelvic carcinoma. Over the winter, she actually spent several months in atrial flutter or atrial fibrillation and then spontaneously converted to normal rhythm early this year. She is in atrial flutter since August 29, 2014, according to interrogation of her pacemake. Rate control appears to be fair with average ventricular rates of 102 bpm. Otherwise, there has been 79% atrial pacing and 1.1% ventricular pacing. During all these times she has not had episodes of stroke or other notable embolic events, despite the fact that she is not receiving any stroke prevention medication. Anticoagulants and even aspirin had to be stopped because of recurrent hematuria. Even now her urine is "pink"intermittently. She had bladder surgery December 3 for recurrent cancer. She occasionally feels hot and flushed and sweaty. Hard to say if there is any correlation with rapid heart rates.   Past Medical History  Diagnosis Date  . Hyperlipidemia   . Diverticulosis   . Allergy   . Anxiety     rhematoid  . Colostomy in place   . PONV (postoperative nausea and vomiting)   . Chronic anticoagulation   . Fluid collection (edema) in the arms, legs, hands and feet   . Urinary frequency     with burning x 2 days  . History of blood transfusion   . Hypertension   . Pacemaker   . Coronary artery disease     DR. Celisse Ciulla IS PT'S  CARDIOLOGIST  . Atrial fibrillation   . Sinus node dysfunction 09/10/2008    Pacemaker [employer]  . RBBB   . PAT (paroxysmal atrial tachycardia)   . Urinary frequency   . Rectal carcinoma   . Colon cancer 03/13/12 egd    rectal mass5cm  , 2-3cm proximal to anal verge  . Bladder cancer   . Ovarian cancer   . Anemia     Past Surgical History  Procedure Laterality Date  . Permanent pacemaker insertion  09/10/2008    [employer]  . Rib removed  1934    Pleurisy and pneumonia  . Partial hysterectomy  1972  . Bladder neck suspension  1986  . Heart catherization  [id number]  . Abdominal hysterectomy      both ovaries intact,   . Colon resection  04/04/2012    Procedure: COLON RESECTION;  Surgeon: Rolm Bookbinder, MD;  Location: [facility];  Service: General;  Laterality: N/A;  laparotomy with small bowel resection for obstruction with colostomy with gastrostomy tube placement.   . Portacath placement  05/04/2012    Procedure: INSERTION PORT-A-CATH;  Surgeon: Rolm Bookbinder, MD;  Location: [facility];  Service: General;  Laterality: Right;  . Insert / replace / remove pacemaker  2/10  . Laparotomy Bilateral 10/03/2012    Procedure: EXPLORATORY LAPAROTOMY;  Surgeon: Imagene Gurney A. Alycia Rossetti, MD;  Location: [facility];  Service: Gynecology;  Laterality: Bilateral;  WITH BSO, TUMOR DEBULKING  . Colostomy revision N/A 10/03/2012    Procedure: COLOSTOMY REVISION;  Surgeon: Rolm Bookbinder, MD;  Location: [facility];  Service: General;  Laterality: N/A;  COLOSTOMY REVISION, Sigmoid colectomy  . US echocardiography  07/17/2008    EF =>55%,mild mitral annular ca+,mild-mod MR,TR,AOV mildly sclerotic  . Nm myoview ltd  07/17/2008    no ischemia  . Port-a-cath removal N/A 02/21/2013    Procedure: REMOVAL PORT-A-CATH;  Surgeon: Rolm Bookbinder, MD;  Location: [facility];  Service: General;  Laterality: N/A;  . Transurethral resection of bladder tumor with gyrus (turbt-gyrus) N/A 09/05/2013    Procedure: TRANSURETHRAL RESECTION  OF BLADDER TUMOR WITH GYRUS (TURBT-GYRUS);  Surgeon: Fredricka Bonine, MD;  Location: [facility];  Service: Urology;  Laterality: N/A;  . Transurethral resection of bladder tumor N/A 06/27/2014    Procedure: TRANSURETHRAL RESECTION OF BLADDER TUMOR (TURBT);  Surgeon: Jorja Loa, MD;  Location: [facility];  Service: Urology;  Laterality: N/A;  . Cystoscopy N/A 06/27/2014    Procedure: CYSTOSCOPY;  Surgeon: Jorja Loa, MD;  Location: [facility];  Service: Urology;  Laterality: N/A;     Current Outpatient Prescriptions  Medication Sig Dispense Refill  . iron polysaccharides (NIFEREX) 150 MG capsule Take 1 capsule (150 mg total) by mouth daily. 30 capsule 6  . lactose free nutrition (BOOST) LIQD Take 237 mLs by mouth daily.    Marland Kitchen diltiazem (CARDIZEM CD) 360 MG 24 hr capsule Take 1 capsule (360 mg total) by mouth daily. 90 capsule 3   No current facility-administered medications for this visit.   Facility-Administered Medications Ordered in Other Visits  Medication Dose Route Frequency Provider Last Rate Last Dose  . heparin 6,000 Units in sodium chloride irrigation 0.9 % 500 mL irrigation   Irrigation Once Rolm Bookbinder, MD      . sodium chloride 0.9 % injection 10 mL  10 mL Intracatheter PRN Ladell Pier, MD   10 mL at 07/18/12 1248    Allergies:   Pindolol; Iodine; Shellfish allergy; Vibramycin; Doxycycline; Levofloxacin; Multaq; and Vicodin    Social History:  The patient  reports that she has never smoked. She has never used smokeless tobacco. She reports that she does not drink alcohol or use illicit drugs.   Family History:  The patient's family history includes Heart disease in her mother.    ROS:  Please see the history of present illness.   Otherwise, review of systems are positive for none.   All other systems are reviewed and negative.    PHYSICAL EXAM: VS:  BP 126/78 mmHg  Pulse 98  Resp 16  Ht 5\' 1"  (1.549 m)  Wt 146 lb 4.8 oz (66.361 kg)  BMI 27.66  kg/m2 , BMI Body mass index is 27.66 kg/(m^2). General: Alert, oriented x3, no distress  Head: no evidence of trauma, PERRL, EOMI, no exophtalmos or lid lag, no myxedema, no xanthelasma; normal ears, nose and oropharynx  Neck: normal jugular venous pulsations and no hepatojugular reflux; brisk carotid pulses without delay and no carotid bruits  Chest: clear to auscultation, no signs of consolidation by percussion or palpation, normal fremitus, symmetrical and full respiratory excursions  Cardiovascular: normal position and quality of the apical impulse, irregular rhythm, normal first and second heart sounds, no murmurs, rubs or gallops  Abdomen: no tenderness or distention, no masses by palpation, no abnormal pulsatility or arterial bruits, normal bowel sounds, no hepatosplenomegaly; colostomy  Extremities: no clubbing, cyanosis or edema; 2+ radial, ulnar  and brachial pulses bilaterally; 2+ right femoral, posterior tibial and dorsalis pedis pulses; 2+ left femoral, posterior tibial and dorsalis pedis pulses; no subclavian or femoral bruits  Neurological: grossly nonfocal Psych: euthymic mood, full affect   EKG:  EKG is ordered today. The ekg ordered today demonstrates atrial flutter, variable AV block, 98 bpm. RBBB (old) QTc 469 ms.   Recent Labs: 06/25/2014: BUN 15; Creatinine 0.99; Hemoglobin 12.8; Platelets 185; Potassium 4.1; Sodium 136*    Lipid Panel    Component Value Date/Time   CHOL 115 04/10/2012 0520   TRIG 80 04/10/2012 0520   HDL 43 05/15/2008 0620   CHOLHDL 3.3 05/15/2008 0620   VLDL 17 05/15/2008 0620   LDLCALC  05/15/2008 0620    82        Total Cholesterol/HDL:CHD Risk Coronary Heart Disease Risk Table                     Men   Women  1/2 Average Risk   3.4   3.3      Wt Readings from Last 3 Encounters:  09/24/14 146 lb 4.8 oz (66.361 kg)  07/04/14 144 lb 1.6 oz (65.363 kg)  06/11/14 145 lb 6.4 oz (65.953 kg)      Other studies Reviewed: Additional  studies/ records that were reviewed today include: notes from Dr. Benay Spice and Dr. Diona Fanti ("the tumor will likely progress in the bladder and potentially elsewhere. There is a high chance of a response with systemic therapy. She does not wish to receive further therapy or testing at present. Ms. Karn will contact Dr. Diona Fanti for recurrent bleeding")   ASSESSMENT AND PLAN:  PAF fib/flutter- Hx RFA 2010  Ideally she should be on full warfarin anticoagulation or equivalent novel agent, but she has hematuria even off therapy. Rate control is imperfect. Increase diltiazem. S/P [facility] Jude Pace Feb 2006 after failed RFA  Pacemaker check in clinic today shows normal device function. Estimated longevity 3.5-6 years. EGM recordings are turned off to prolong device longevity.   Current medicines are reviewed at length with the patient today.  The patient does not have concerns regarding medicines.  The following changes have been made:  INCREASE Diltiazem to 360mg  daily.  Labs/ tests ordered today include:  Orders Placed This Encounter  Procedures  . Implantable device check  . EKG 12-Lead    Patient Instructions  INCREASE Diltiazem to 360mg  daily.  Dr. Sallyanne Kuster recommends that you schedule a follow-up appointment in: 3 months with pacemaker check ([employer]).      Mikael Spray, MD  09/30/2014 7:33 PM    [facility] [address], [place], [place]  [address] Phone: [phone]; Fax: [phone]

## 2014-10-03 ENCOUNTER — Telehealth: Payer: Self-pay | Admitting: Oncology

## 2014-10-03 ENCOUNTER — Ambulatory Visit (HOSPITAL_BASED_OUTPATIENT_CLINIC_OR_DEPARTMENT_OTHER): Payer: Medicare Other | Admitting: Oncology

## 2014-10-03 ENCOUNTER — Other Ambulatory Visit (HOSPITAL_BASED_OUTPATIENT_CLINIC_OR_DEPARTMENT_OTHER): Payer: Medicare Other

## 2014-10-03 VITALS — BP 132/77 | HR 94 | Temp 98.0°F | Resp 18 | Ht 61.0 in | Wt 145.2 lb

## 2014-10-03 DIAGNOSIS — C569 Malignant neoplasm of unspecified ovary: Secondary | ICD-10-CM

## 2014-10-03 DIAGNOSIS — R310 Gross hematuria: Secondary | ICD-10-CM

## 2014-10-03 DIAGNOSIS — C671 Malignant neoplasm of dome of bladder: Secondary | ICD-10-CM

## 2014-10-03 DIAGNOSIS — C561 Malignant neoplasm of right ovary: Secondary | ICD-10-CM

## 2014-10-03 LAB — URINALYSIS, MICROSCOPIC - CHCC
BILIRUBIN (URINE): NEGATIVE
Glucose: NEGATIVE mg/dL
Ketones: NEGATIVE mg/dL
Nitrite: NEGATIVE
Protein: 100 mg/dL
SPECIFIC GRAVITY, URINE: 1.025 (ref 1.003–1.035)
UROBILINOGEN UR: 0.2 mg/dL (ref 0.2–1)
pH: 6 (ref 4.6–8.0)

## 2014-10-03 LAB — CBC WITH DIFFERENTIAL/PLATELET
BASO%: 0.4 % (ref 0.0–2.0)
Basophils Absolute: 0 10*3/uL (ref 0.0–0.1)
EOS%: 1.5 % (ref 0.0–7.0)
Eosinophils Absolute: 0.1 10*3/uL (ref 0.0–0.5)
HEMATOCRIT: 39.9 % (ref 34.8–46.6)
HGB: 13.3 g/dL (ref 11.6–15.9)
LYMPH#: 1 10*3/uL (ref 0.9–3.3)
LYMPH%: 18.8 % (ref 14.0–49.7)
MCH: 31.7 pg (ref 25.1–34.0)
MCHC: 33.3 g/dL (ref 31.5–36.0)
MCV: 95.2 fL (ref 79.5–101.0)
MONO#: 0.5 10*3/uL (ref 0.1–0.9)
MONO%: 9.9 % (ref 0.0–14.0)
NEUT#: 3.6 10*3/uL (ref 1.5–6.5)
NEUT%: 69.4 % (ref 38.4–76.8)
Platelets: 176 10*3/uL (ref 145–400)
RBC: 4.19 10*6/uL (ref 3.70–5.45)
RDW: 13.7 % (ref 11.2–14.5)
WBC: 5.2 10*3/uL (ref 3.9–10.3)

## 2014-10-03 NOTE — Progress Notes (Signed)
  [place] OFFICE PROGRESS NOTE   Diagnosis: Ovarian cancer  INTERVAL HISTORY:   Victoria [name] returns as scheduled. She feels well. Good appetite. She is active. The abdomen is distended secondary to a "hernia". She continues to have intermittent hematuria. The urine is pink and occasionally there are streaks of blood.  Objective:  Vital signs in last 24 hours:  Blood pressure 132/77, pulse 94, temperature 98 F (36.7 C), temperature source Oral, resp. rate 18, height 5\' 1"  (1.549 m), weight 145 lb 3.2 oz (65.862 kg), SpO2 98 %.    HEENT: Neck without mass Lymphatics: No cervical, supra-clavicular, axillary, or inguinal nodes Resp: Lungs with decreased breath sounds at the right lower chest, no respiratory distress Cardio: Regular in rhythm GI: No hepatosplenomegaly, left lower quadrant colostomy, small incisional hernia, no mass Vascular: No leg edema  Lab Results:  Lab Results  Component Value Date   WBC 5.2 10/03/2014   HGB 13.3 10/03/2014   HCT 39.9 10/03/2014   MCV 95.2 10/03/2014   PLT 176 10/03/2014   NEUTROABS 3.6 10/03/2014   Urinalysis: Nitrite negative, leukocyte esterase small, 3-6 white cells, 3-6 red cells, few bacteria, moderate blood  Medications: I have reviewed the patient's current medications.  Assessment/Plan: 1. Ovarian cancer-presenting with a rectal mass , status post sigmoidoscopy 03/13/2012 with findings of a 5 cm malignant appearing mass with central ulceration in the rectum 2-3 cm proximal to the anal verge. Pathology showed moderate to poorly differentiated adenocarcinoma with ulceration and prolapse changes. By immunohistochemistry the malignant cells were positive for cytokeratin 7, estrogen receptor and WT-1. They were negative for cytokeratin 20 and CDX-2. This immunohistochemical profile was strongly suggestive of a gynecologic primary. -Status post 3 cycles of Taxol/carboplatin chemotherapy with normalization of the CA 125,  restaging CT 08/07/2012 with marked improvement in the pelvic masses and no evidence of progressive ovarian cancer. She completed cycle 4 of Taxol/carboplatin beginning 08/08/2012. 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 She completed cycle 6 Taxol/carboplatin beginning 11/21/2012. 2. History of Atrial fibrillation  3. Status post Port-A-Cath placement 05/04/2012. Port-A-Cath removal 02/21/2013 4. 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 5. Admission 09/02/2013 with gross hematuria-right pelvic mass with involvement of the bladder confirmed on CT with a cystoscopy confirming a large tumor in the bladder with associated bleeding, status post TURBT and fulguration, pathology consistent with a high-grade carcinoma. Immunohistochemical stains returned positive for cytokeratin 7 and WT-1, and negative for cytokeratin 20 and cytokeratin 903. She completed a course of palliative radiation 09/13/2013 through 10/22/2013.  Status post transurethral resection of a greater than 5 cm tumor at the right dome of the bladder 06/27/2014, pathology confirmed high-grade poorly differentiated carcinoma 6. History of anemia secondary to bleeding. She continues iron.   Disposition:  Victoria [name] appears stable. She continues to have intermittent gross hematuria, likely related to tumor in the bladder. She would like to see Dr. Diona Fanti to discuss the indication for a repeat cystoscopy/local therapy. She does not wish to receive further chemotherapy.  Victoria [name] will return for an office visit in 3 months.  [name], MD  10/03/2014  11:21 AM

## 2014-10-03 NOTE — Telephone Encounter (Signed)
Faxed pt medical records to Dr. Eulogio Ditch [phone]

## 2014-10-03 NOTE — Telephone Encounter (Signed)
gv and printed appt sched and avs for pt for April and Jne.Marland KitchenMarland KitchenMarland KitchenPt sched to see Dr. Eulogio Ditch on 4.20 @ 10am

## 2014-10-04 LAB — CA 125: CA 125: 12 U/mL (ref <35)

## 2014-10-07 ENCOUNTER — Telehealth: Payer: Self-pay | Admitting: *Deleted

## 2014-10-07 NOTE — Telephone Encounter (Signed)
-----   Message from [facility], LPN sent at 2/70/7867  9:35 AM EDT -----   ----- Message -----    From: Ladell Pier, MD    Sent: 10/04/2014   5:12 PM      To: Brien Few, RN, Amy P Horton, RN, #  Please call patient, ca125 is normal

## 2014-10-07 NOTE — Telephone Encounter (Signed)
Pt called and informed of lab results. She was inquiring the parameters and we discussed her last results. She verbalized an understanding and had no further concerns. Pt is feeling very well today.

## 2014-10-08 DIAGNOSIS — R001 Bradycardia, unspecified: Secondary | ICD-10-CM | POA: Diagnosis not present

## 2014-10-14 ENCOUNTER — Encounter: Payer: Self-pay | Admitting: Cardiovascular Disease

## 2014-11-13 ENCOUNTER — Other Ambulatory Visit: Payer: Self-pay | Admitting: Urology

## 2014-11-14 ENCOUNTER — Encounter (HOSPITAL_BASED_OUTPATIENT_CLINIC_OR_DEPARTMENT_OTHER): Payer: Self-pay | Admitting: *Deleted

## 2014-11-14 NOTE — Progress Notes (Signed)
NPO AFTER MN.  ARRIVE AT 0600. NEEDS ISTAT.  CURRENT EKG IN CHART AND EPIC. WILL TAKE CARDIZEM AM DOS W/ SIPS OF WATER.

## 2014-11-17 NOTE — H&P (Signed)
H&P  Chief Complaint: Gross hematuria  History of Present Illness: Victoria [name] is a 81 y.o. year old female with a history of primary ovarian carcinoma with locally aggressive spread, presents at this time for cystoscopy, TURBT of a large bladder tumor which is from contiguous spread of her pelvic mass. She was recently seen in the office with recurrent gross hematuria with clots. Because of the symptomatic issue, she presents for palliative resection of this recurrent cancer.  Past Medical History  Diagnosis Date  . Hyperlipidemia   . Diverticulosis   . Colostomy in place   . PONV (postoperative nausea and vomiting)   . Chronic anticoagulation   . Fluid collection (edema) in the arms, legs, hands and feet   . Urinary frequency     with burning x 2 days  . Hypertension   . RBBB   . PAT (paroxysmal atrial tachycardia)   . Coronary artery disease     DR. CROITORU IS PT'S CARDIOLOGIST  . Anxiety   . Iron deficiency anemia   . Gross hematuria   . Cardiac pacemaker in [place]-- PLACEMENT 09-10-2008  . Paroxysmal atrial flutter     CARDIOLOGIST--  DR CROITORU  . SSS (sick sinus syndrome)     S/P PACEMAKER  . Bladder cancer recurrent bladder tumor     secondarty to ovarian cancer--  palliative radiation 09-13-2013 to 10-22-2013  . Malignant neoplasm of ovary ONOCOLOGIST--  DR Benay Spice  03-13-2012  w/ rectal mass    dx  primay ovarian cancer , +cytokeratin 7, estrogen receptor and WT-1--  chemotherapy and s/p omentectomy and bilateral oophorectomy03-05-2013    Past Surgical History  Procedure Laterality Date  . Permanent pacemaker insertion  09-10-2008    [employer]  . Rib removed  1934    Pleurisy and pneumonia  . Bladder neck suspension  1986  . Colon resection  04/04/2012    Procedure: COLON RESECTION;  Surgeon: Rolm Bookbinder, MD;  Location: [facility];  Service: General;  Laterality: N/A;  laparotomy with small bowel resection for obstruction with colostomy with  gastrostomy tube placement.   . Portacath placement  05/04/2012    Procedure: INSERTION PORT-A-CATH;  Surgeon: Rolm Bookbinder, MD;  Location: [facility];  Service: General;  Laterality: Right;  . Laparotomy Bilateral 10/03/2012    Procedure: EXPLORATORY LAPAROTOMY;  Surgeon: Imagene Gurney A. Alycia Rossetti, MD;  Location: [facility];  Service: Gynecology;  Laterality: Bilateral;  WITH BSO, TUMOR DEBULKING  . Colostomy revision N/A 10/03/2012    Procedure: COLOSTOMY REVISION;  Surgeon: Rolm Bookbinder, MD;  Location: [facility];  Service: General;  Laterality: N/A;  COLOSTOMY REVISION, Sigmoid colectomy  . Port-a-cath removal N/A 02/21/2013    Procedure: REMOVAL PORT-A-CATH;  Surgeon: Rolm Bookbinder, MD;  [facility]: [facility];  Service: General;  Laterality: N/A;  . Transurethral resection of bladder tumor with gyrus (turbt-gyrus) N/A 09/05/2013    Procedure: TRANSURETHRAL RESECTION OF BLADDER TUMOR WITH GYRUS (TURBT-GYRUS);  Surgeon: Fredricka Bonine, MD;  [facility]: [facility];  Service: Urology;  Laterality: N/A;  . Transurethral resection of bladder tumor N/A 06/27/2014    Procedure: TRANSURETHRAL RESECTION OF BLADDER TUMOR (TURBT);  Surgeon: Jorja Loa, MD;  [facility];  Service: Urology;  Laterality: N/A;  . Cystoscopy N/A 06/27/2014    Procedure: CYSTOSCOPY;  Surgeon: Jorja Loa, MD;  Location: [facility];  Service: Urology;  Laterality: N/A;  . Abdominal hysterectomy  1972  . Cardiac electrophysiology mapping and ablation  05-21-2008  dr Caryl Comes    a-flutter  . Electrophysiology study  09-26-2008  dr Caryl Comes  03-27-2012  dr croitoru    cardiac ,  no ablation in 2010/   2013  overdrive pacing atrial flutter  . Transthoracic echocardiogram  07-17-2008      ef >55%,  mild to moderate MR and TR,  mild  sclerotic AV without stenosis,    . Cardiovascular stress test  07-17-2008  dr croitoru    normal myocardial perfusion w/ attenuation artifact in the anterior region of mycardium;  no ischemia or  infarct/scar/  low risk scan/  normal LV function and wall motion , ef  83%    Home Medications:  No prescriptions prior to admission    Allergies:  Allergies  Allergen Reactions  . Pindolol Nausea Only and Swelling    Throat swells  . Iodine Other (See Comments)    BLISTERS  . Shellfish Allergy Diarrhea and Nausea And Vomiting  . Vibramycin [Doxycycline Calcium] Nausea Only and Other (See Comments)    dizziness  . Doxycycline Nausea Only  . Levofloxacin Other (See Comments)    DIZZINESS  . Multaq [Dronedarone] Other (See Comments)    CAUSED SEVERE BURNING GI TRACT  . Vicodin [Hydrocodone-Acetaminophen] Other (See Comments)    dizziness    Family History  Problem Relation Age of Onset  . Heart disease Mother     Social History:  reports that she has never smoked. She has never used smokeless tobacco. She reports that she does not drink alcohol or use illicit drugs.  ROS: A complete review of systems was performed.  All systems are negative except for pertinent findings as noted.  Physical Exam:  Vital signs in last 24 hours:   General:  Alert and oriented, No acute distress HEENT: Normocephalic, atraumatic Neck: No JVD or lymphadenopathy Cardiovascular: Regular rate and rhythm Lungs: Clear bilaterally Abdomen: Soft, nontender, nondistended, no abdominal masses Back: No CVA tenderness Extremities: No edema Neurologic: Grossly intact  Laboratory Data:  No results found for this or any previous visit (from the past 24 hour(s)). No results found for this or any previous visit (from the past 240 hour(s)). Creatinine: No results for input(s): CREATININE in the last 168 hours.  Radiologic Imaging: No results found.  Impression/Assessment:  Recurrent local progression of primary ovarian cancer involving the bladder  Plan:  TURBT greater than 5 cm bladder tumor  Jorja Loa 11/17/2014, 9:34 AM  Lillette Boxer. Ollie Esty MD

## 2014-11-18 ENCOUNTER — Ambulatory Visit (HOSPITAL_BASED_OUTPATIENT_CLINIC_OR_DEPARTMENT_OTHER): Payer: Medicare Other | Admitting: Anesthesiology

## 2014-11-18 ENCOUNTER — Observation Stay (HOSPITAL_BASED_OUTPATIENT_CLINIC_OR_DEPARTMENT_OTHER)
Admission: RE | Admit: 2014-11-18 | Discharge: 2014-11-19 | Disposition: A | Discharge status: Home / Self Care | Payer: Medicare Other | Source: Ambulatory Visit | Attending: Urology | Admitting: Urology

## 2014-11-18 ENCOUNTER — Encounter (HOSPITAL_BASED_OUTPATIENT_CLINIC_OR_DEPARTMENT_OTHER): Payer: Self-pay | Admitting: *Deleted

## 2014-11-18 ENCOUNTER — Encounter (HOSPITAL_COMMUNITY)
Admission: RE | Disposition: A | Discharge status: Home / Self Care | Payer: Self-pay | Source: Ambulatory Visit | Attending: Urology

## 2014-11-18 DIAGNOSIS — Z7901 Long term (current) use of anticoagulants: Secondary | ICD-10-CM | POA: Insufficient documentation

## 2014-11-18 DIAGNOSIS — I4891 Unspecified atrial fibrillation: Secondary | ICD-10-CM | POA: Diagnosis not present

## 2014-11-18 DIAGNOSIS — E785 Hyperlipidemia, unspecified: Secondary | ICD-10-CM | POA: Insufficient documentation

## 2014-11-18 DIAGNOSIS — I2510 Atherosclerotic heart disease of native coronary artery without angina pectoris: Secondary | ICD-10-CM | POA: Insufficient documentation

## 2014-11-18 DIAGNOSIS — Z950 Presence of cardiac pacemaker: Secondary | ICD-10-CM | POA: Diagnosis not present

## 2014-11-18 DIAGNOSIS — Z79899 Other long term (current) drug therapy: Secondary | ICD-10-CM | POA: Insufficient documentation

## 2014-11-18 DIAGNOSIS — C569 Malignant neoplasm of unspecified ovary: Secondary | ICD-10-CM | POA: Diagnosis present

## 2014-11-18 DIAGNOSIS — Z933 Colostomy status: Secondary | ICD-10-CM | POA: Diagnosis not present

## 2014-11-18 DIAGNOSIS — Z8543 Personal history of malignant neoplasm of ovary: Secondary | ICD-10-CM | POA: Diagnosis not present

## 2014-11-18 DIAGNOSIS — C7911 Secondary malignant neoplasm of bladder: Principal | ICD-10-CM | POA: Insufficient documentation

## 2014-11-18 DIAGNOSIS — I10 Essential (primary) hypertension: Secondary | ICD-10-CM | POA: Diagnosis not present

## 2014-11-18 DIAGNOSIS — K5790 Diverticulosis of intestine, part unspecified, without perforation or abscess without bleeding: Secondary | ICD-10-CM | POA: Diagnosis not present

## 2014-11-18 DIAGNOSIS — R310 Gross hematuria: Secondary | ICD-10-CM | POA: Diagnosis present

## 2014-11-18 HISTORY — DX: Sick sinus syndrome: I49.5

## 2014-11-18 HISTORY — DX: Iron deficiency anemia, unspecified: D50.9

## 2014-11-18 HISTORY — PX: CYSTOSCOPY: SHX5120

## 2014-11-18 HISTORY — DX: Presence of cardiac pacemaker: Z95.0

## 2014-11-18 HISTORY — PX: TRANSURETHRAL RESECTION OF BLADDER TUMOR: SHX2575

## 2014-11-18 HISTORY — DX: Malignant neoplasm of unspecified ovary: C56.9

## 2014-11-18 HISTORY — DX: Unspecified atrial flutter: I48.92

## 2014-11-18 HISTORY — DX: Gross hematuria: R31.0

## 2014-11-18 LAB — POCT I-STAT 4, (NA,K, GLUC, HGB,HCT)
Glucose, Bld: 113 mg/dL — ABNORMAL HIGH (ref 70–99)
HCT: 40 % (ref 36.0–46.0)
HEMOGLOBIN: 13.6 g/dL (ref 12.0–15.0)
POTASSIUM: 3.8 mmol/L (ref 3.5–5.1)
Sodium: 140 mmol/L (ref 135–145)

## 2014-11-18 SURGERY — TURBT (TRANSURETHRAL RESECTION OF BLADDER TUMOR)
Anesthesia: General | Site: Bladder

## 2014-11-18 MED ORDER — DOCUSATE SODIUM 100 MG PO CAPS
100.0000 mg | ORAL_CAPSULE | Freq: Two times a day (BID) | ORAL | Status: DC
  Administered 2014-11-18 – 2014-11-19 (×2): 100 mg via ORAL
  Filled 2014-11-18 (×3): qty 1

## 2014-11-18 MED ORDER — SODIUM CHLORIDE 0.45 % IV SOLN
INTRAVENOUS | Status: DC
  Administered 2014-11-18 – 2014-11-19 (×2): via INTRAVENOUS

## 2014-11-18 MED ORDER — CEFAZOLIN SODIUM-DEXTROSE 2-3 GM-% IV SOLR
2.0000 g | INTRAVENOUS | Status: AC
  Administered 2014-11-18: 2 g via INTRAVENOUS
  Filled 2014-11-18: qty 50

## 2014-11-18 MED ORDER — CEFAZOLIN SODIUM-DEXTROSE 2-3 GM-% IV SOLR
INTRAVENOUS | Status: AC
  Filled 2014-11-18: qty 50

## 2014-11-18 MED ORDER — CEFAZOLIN SODIUM 1-5 GM-% IV SOLN
1.0000 g | INTRAVENOUS | Status: DC
  Filled 2014-11-18: qty 50

## 2014-11-18 MED ORDER — LIDOCAINE HCL (CARDIAC) 20 MG/ML IV SOLN
INTRAVENOUS | Status: DC | PRN
  Administered 2014-11-18: 50 mg via INTRAVENOUS

## 2014-11-18 MED ORDER — ONDANSETRON HCL 4 MG/2ML IJ SOLN
INTRAMUSCULAR | Status: DC | PRN
  Administered 2014-11-18 (×2): 4 mg via INTRAVENOUS

## 2014-11-18 MED ORDER — CEPHALEXIN 250 MG PO CAPS
250.0000 mg | ORAL_CAPSULE | Freq: Two times a day (BID) | ORAL | Status: DC
  Administered 2014-11-18 – 2014-11-19 (×3): 250 mg via ORAL
  Filled 2014-11-18 (×4): qty 1

## 2014-11-18 MED ORDER — OXYBUTYNIN CHLORIDE 5 MG PO TABS: 5.0000 mg | ORAL_TABLET | Freq: Three times a day (TID) | ORAL | Status: DC | PRN

## 2014-11-18 MED ORDER — PHENYLEPHRINE HCL 10 MG/ML IJ SOLN
INTRAMUSCULAR | Status: DC | PRN
  Administered 2014-11-18 (×3): 40 ug via INTRAVENOUS

## 2014-11-18 MED ORDER — FENTANYL CITRATE (PF) 100 MCG/2ML IJ SOLN
INTRAMUSCULAR | Status: DC | PRN
  Administered 2014-11-18: 25 ug via INTRAVENOUS

## 2014-11-18 MED ORDER — DILTIAZEM HCL ER COATED BEADS 180 MG PO CP24
360.0000 mg | ORAL_CAPSULE | Freq: Every morning | ORAL | Status: DC
  Administered 2014-11-19: 360 mg via ORAL
  Filled 2014-11-18: qty 2

## 2014-11-18 MED ORDER — ACETAMINOPHEN 325 MG PO TABS: 650.0000 mg | ORAL_TABLET | ORAL | Status: DC | PRN

## 2014-11-18 MED ORDER — PROPOFOL 10 MG/ML IV BOLUS
INTRAVENOUS | Status: DC | PRN
  Administered 2014-11-18: 100 mg via INTRAVENOUS

## 2014-11-18 MED ORDER — LACTATED RINGERS IV SOLN
INTRAVENOUS | Status: DC
  Administered 2014-11-18: 07:00:00 via INTRAVENOUS
  Filled 2014-11-18: qty 1000

## 2014-11-18 MED ORDER — FENTANYL CITRATE (PF) 100 MCG/2ML IJ SOLN
INTRAMUSCULAR | Status: AC
  Filled 2014-11-18: qty 6

## 2014-11-18 MED ORDER — STERILE WATER FOR IRRIGATION IR SOLN
Status: DC | PRN
  Administered 2014-11-18: 30 mL

## 2014-11-18 MED ORDER — BOOST PO LIQD
237.0000 mL | Freq: Every day | ORAL | Status: DC
  Filled 2014-11-18 (×2): qty 237

## 2014-11-18 MED ORDER — SODIUM CHLORIDE 0.9 % IR SOLN
Status: DC | PRN
  Administered 2014-11-18: 3000 mL
  Administered 2014-11-18: 6500 mL
  Administered 2014-11-18: 6000 mL
  Administered 2014-11-18: 3000 mL

## 2014-11-18 SURGICAL SUPPLY — 29 items
BAG DRAIN URO-CYSTO SKYTR STRL (DRAIN) ×3 IMPLANT
BAG DRN ANRFLXCHMBR STRAP LEK (BAG)
BAG DRN UROCATH (DRAIN) ×1
BAG URINE DRAINAGE (UROLOGICAL SUPPLIES) IMPLANT
BAG URINE LEG 19OZ MD ST LTX (BAG) IMPLANT
CANISTER SUCT LVC 12 LTR MEDI- (MISCELLANEOUS) ×6 IMPLANT
CATH FOLEY 2WAY SLVR  5CC 20FR (CATHETERS)
CATH FOLEY 2WAY SLVR  5CC 22FR (CATHETERS)
CATH FOLEY 2WAY SLVR 5CC 20FR (CATHETERS) IMPLANT
CATH FOLEY 2WAY SLVR 5CC 22FR (CATHETERS) IMPLANT
CATH FOLEY 3WAY 30CC 22F (CATHETERS) ×2 IMPLANT
CLOTH BEACON ORANGE TIMEOUT ST (SAFETY) ×3 IMPLANT
ELECT BIVAP BIPO 22/24 DONUT (ELECTROSURGICAL) ×3
ELECT REM PT RETURN 9FT ADLT (ELECTROSURGICAL) ×3
ELECTRD BIVAP BIPO 22/24 DONUT (ELECTROSURGICAL) IMPLANT
ELECTRODE REM PT RTRN 9FT ADLT (ELECTROSURGICAL) ×1 IMPLANT
GLOVE BIO SURGEON STRL SZ8 (GLOVE) ×3 IMPLANT
GOWN STRL REUS W/ TWL XL LVL3 (GOWN DISPOSABLE) ×1 IMPLANT
GOWN STRL REUS W/TWL XL LVL3 (GOWN DISPOSABLE) ×6
HOLDER FOLEY CATH W/STRAP (MISCELLANEOUS) IMPLANT
IV NS IRRIG 3000ML ARTHROMATIC (IV SOLUTION) ×8 IMPLANT
LOOP CUT BIPOLAR 24F LRG (ELECTROSURGICAL) ×2 IMPLANT
NS IRRIG 500ML POUR BTL (IV SOLUTION) ×2 IMPLANT
PACK CYSTO (CUSTOM PROCEDURE TRAY) ×3 IMPLANT
PLUG CATH AND CAP STER (CATHETERS) IMPLANT
SET ASPIRATION TUBING (TUBING) ×2 IMPLANT
SYR 30ML LL (SYRINGE) ×2 IMPLANT
SYRINGE IRR TOOMEY STRL 70CC (SYRINGE) ×2 IMPLANT
WATER STERILE IRR 500ML POUR (IV SOLUTION) ×2 IMPLANT

## 2014-11-18 NOTE — Anesthesia Postprocedure Evaluation (Signed)
  Anesthesia Post-op Note  Patient: Victoria [name]  Procedure(s) Performed: Procedure(s): TRANSURETHRAL RESECTION OF BLADDER TUMOR (TURBT) (N/A) CYSTOSCOPY (N/A)  Patient Location: PACU  Anesthesia Type:General  Level of Consciousness: awake and alert   Airway and Oxygen Therapy: Patient Spontanous Breathing  Post-op Pain: none  Post-op Assessment: Post-op Vital signs reviewed, Patient's Cardiovascular Status Stable and Respiratory Function Stable  Post-op Vital Signs: Reviewed  Filed Vitals:   11/18/14 0930  BP: 118/81  Pulse: 97  Temp:   Resp: 18    Complications: No apparent anesthesia complications

## 2014-11-18 NOTE — Discharge Instructions (Signed)
Transurethral Resection of Bladder Tumor (TURBT)  °Definition:  °Transurethral Resection of the Bladder Tumor is a surgical procedure used to diagnose and remove tumors within the bladder. TURBT is the most common treatment for early stage bladder cancer.  ° °General instructions:  °Your recent bladder surgery requires very little post hospital care but some definite precautions.  °Despite the fact that no skin incisions were used, the area around the tumor removal site is raw and covered with scabs to promote healing and prevent bleeding. Certain precautions are needed to insure that the scabs are not disturbed over the next 2-4 weeks while the healing proceeds.  °Because the raw surface inside your bladder and the irritating effects of urine you may expect frequency of urination and/or urgency (a stronger desire to urinate) and perhaps even getting up at night more often. This will usually resolve or improve slowly over the healing period. You may see some blood in your urine over the first 6 weeks. Do not be alarmed, even if the urine was clear for a while. Get off your feet and drink lots of fluids until clearing occurs. If you start to pass clots or don't improve call us.  ° °Diet:  °You may return to your normal diet immediately. Because of the raw surface of your bladder, alcohol, spicy foods, foods high in acid and drinks with caffeine may cause irritation or frequency and should be used in moderation. To keep your urine flowing freely and avoid constipation, drink plenty of fluids during the day (8-10 glasses). Tip: Avoid cranberry juice because it is very acidic. °  °Activity:  °Your physical activity needs to be restricted somewhat.  We suggest that you reduce your activity under the circumstances until the bleeding has stopped. Heavy lifting (greater than 20 lbs.) and heavy exertion should be limited for 2-3 weeks. ° °Bowels:  °It is important to keep your bowels regular during the postoperative period.  Straining with bowel movements can cause bleeding. A bowel movement every other day is reasonable. Use a mild laxative if needed, such as milk of magnesia 2-3 tablespoons, or 2 Dulcolax tablets. Call if you continue to have problems. If you had been taking narcotics for pain, before, during or after your surgery, you may be constipated.  ° °Medication:  °You should resume your pre-surgery medications unless told not to. In addition you may be given an antibiotic to prevent or treat infection. Antibiotics are not always necessary. All medication should be taken as prescribed until the bottles are finished unless you are having an unusual reaction to one of the drugs.  °General Anesthetic, Adult  °A doctor specialized in giving anesthesia (anesthesiologist) or a nurse specialized in giving anesthesia (nurse anesthetist) gives medicine that makes you sleep while a procedure is performed (general anesthetic). Once the general anesthetic has been administered, you will be in a sleeplike state in which you feel no pain. After having a general anesthetic you may feel:  °Dizzy.  °Weak.  °Drowsy.  °Confused.  °These feelings are normal and can be expected to last for up to 24 hours after the procedure. ° ° °LET YOUR CAREGIVER KNOW ABOUT:  °Allergies you have.  °Medications you are taking, including herbs, eye drops, over the counter medications, dietary supplements, and creams.  °Previous problems you have had with anesthetics or numbing medicines.  °Use of cigarettes, alcohol, or illicit drugs.  °Possibility of pregnancy, if this applies.  °History of bleeding or blood disorders, including blood clots and clotting   disorders.  °Previous surgeries you have had and types of anesthetics you have received.  °Family medical history, especially anesthetic problems.  °Other health problems.  ° °AFTER THE PROCEDURE  °After surgery, you will be taken to the recovery area where a nurse will monitor your progress. You will be allowed  to go home when you are awake, stable, taking fluids well, and without serious pain or complications.  °For the first 24 hours following an anesthetic:  °Have a responsible person with you.  °Do not drive a car. If you are alone, do not take public transportation.  °Do not engage in strenuous activity. You may usually resume normal activities the next day, or as advised by your caregiver.  °Do not drink alcohol.  °Do not take medicine that has not been prescribed by your caregiver.  °Do not sign important papers or make important decisions as your judgement may be impaired.  °You may resume a normal diet as directed.  °Change bandages (dressings) as directed.  °Only take over-the-counter or prescription medicines for pain, discomfort, or fever as directed by your caregiver.  °If you have questions or problems that seem related to the anesthetic, call the hospital and ask for the anesthetist, anesthesiologist, or anesthesia department.  ° °SEEK IMMEDIATE MEDICAL CARE IF:  °You develop a rash.  °You have difficulty breathing.  °You have chest pain.  °You have allergic problems.  °You have uncontrolled nausea.  °You have uncontrolled vomiting.  °You develop any serious bleeding, especially from the incision site.  °Document Released: 10/19/2007 Document Revised: 03/24/2011 Document Reviewed: 11/12/2010  °[employer]® Patient Information ©2012 [employer].  ° ° °

## 2014-11-18 NOTE — Anesthesia Procedure Notes (Signed)
Procedure Name: LMA Insertion Date/Time: 11/18/2014 7:36 AM Performed by: Denna Haggard D Pre-anesthesia Checklist: Patient identified, Emergency Drugs available, Suction available and Patient being monitored Patient Re-evaluated:Patient Re-evaluated prior to inductionOxygen Delivery Method: Circle System Utilized Preoxygenation: Pre-oxygenation with 100% oxygen Intubation Type: IV induction Ventilation: Mask ventilation without difficulty LMA: LMA inserted LMA Size: 4.0 Number of attempts: 1 Airway Equipment and Method: Bite block Placement Confirmation: positive ETCO2 Tube secured with: Tape Dental Injury: Teeth and Oropharynx as per pre-operative assessment

## 2014-11-18 NOTE — Op Note (Signed)
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 Anna Beaird, M.D.   Anesthesia: Gen.   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 She underwent prior palliative resection due to hematuria in December, 2015. She has had recurrent hematuria, despite not having any significant pain. Recent cystoscopy revealed a fairly large recurrence within her bladder. Because of the patient's persistent hematuria, she requests repeat resection. Risks and complications of the procedure have been discussed with the patient. She understands these and desires to proceed.    Technique and findings: The patient was identified in the holding area per she received preoperative IV antibiotics. She was taken to the operating room where general anesthetic was administered. She was placed in the dorsolithotomy position. Genitalia and perineum were prepped and draped. Proper timeout was performed.  A 22 French panendoscope was advanced into her bladder. Circumference inspection was performed. Was a large fungating mass in her right posterior dome. This had papillary and nodular insistence into it. No other lesions were seen. There were no trabeculations or foreign bodies. Ureteral orifices were normal.  I placed a 28 French resectoscope sheath with a large adding loop. The tumor was resected, starting at the most anterior aspect, and moving posteriorly. A large amount of tumor was resected. It was not a very vascular tumor, however after the majority the tumor had been resected, I then placed the gyrus button, and the tumor was fulgurated  with both cutting and coagulation currents. Following vigorous fulguration and cauterization, the tumor was nicely resected down to a flat surface. There was no significant bleeding. At this point, resection/procedure was terminated. Small chips were evacuated with the Toomey syringe. These were saved for permanent specimen, labeled "bladder tumor". Proper specimen timeout was performed.  At this point, the scope was removed. A 22 French three-way Foley catheter was placed with 30 mL of water placed in the balloon. This was hooked to CBI. The patient tolerated procedure well and she is awakened and taken to the PACU in stable condition.

## 2014-11-18 NOTE — Transfer of Care (Signed)
Immediate Anesthesia Transfer of Care Note  Patient: Victoria [name]  Procedure(s) Performed: Procedure(s) (LRB): TRANSURETHRAL RESECTION OF BLADDER TUMOR (TURBT) (N/A) CYSTOSCOPY (N/A)  Patient Location: PACU  Anesthesia Type: General  Level of Consciousness: awake, oriented, sedated and patient cooperative  Airway & Oxygen Therapy: Patient Spontanous Breathing and Patient connected to face mask oxygen  Post-op Assessment: Report given to PACU RN and Post -op Vital signs reviewed and stable  Post vital signs: Reviewed and stable  Complications: No apparent anesthesia complications

## 2014-11-18 NOTE — Anesthesia Preprocedure Evaluation (Addendum)
Anesthesia Evaluation  Patient identified by MRN, date of birth, ID band Patient awake    Reviewed: Allergy & Precautions, H&P , NPO status , Patient's Chart, lab work & pertinent test results  History of Anesthesia Complications (+) PONV  Airway Mallampati: II  TM Distance: >3 FB Neck ROM: Full    Dental no notable dental hx. (+) Teeth Intact, Dental Advisory Given   Pulmonary neg pulmonary ROS,  breath sounds clear to auscultation  Pulmonary exam normal       Cardiovascular hypertension, Pt. on medications + CAD + dysrhythmias Atrial Fibrillation + pacemaker Rhythm:Regular Rate:Normal     Neuro/Psych negative neurological ROS  negative psych ROS   GI/Hepatic negative GI ROS, Neg liver ROS,   Endo/Other  negative endocrine ROS  Renal/GU negative Renal ROS  negative genitourinary   Musculoskeletal   Abdominal   Peds  Hematology negative hematology ROS (+)   Anesthesia Other Findings   Reproductive/Obstetrics negative OB ROS                            Anesthesia Physical Anesthesia Plan  ASA: III  Anesthesia Plan: General   Post-op Pain Management:    Induction: Intravenous  Airway Management Planned: LMA  Additional Equipment:   Intra-op Plan:   Post-operative Plan: Extubation in OR  Informed Consent: I have reviewed the patients History and Physical, chart, labs and discussed the procedure including the risks, benefits and alternatives for the proposed anesthesia with the patient or authorized representative who has indicated his/her understanding and acceptance.   Dental advisory given  Plan Discussed with: CRNA  Anesthesia Plan Comments:         Anesthesia Quick Evaluation

## 2014-11-18 NOTE — Interval H&P Note (Signed)
Will proceed as planed--discussed with patient. Will leave in OBS overnight.

## 2014-11-19 ENCOUNTER — Encounter (HOSPITAL_BASED_OUTPATIENT_CLINIC_OR_DEPARTMENT_OTHER): Payer: Self-pay | Admitting: Urology

## 2014-11-19 DIAGNOSIS — C7911 Secondary malignant neoplasm of bladder: Secondary | ICD-10-CM | POA: Diagnosis not present

## 2014-11-19 NOTE — Progress Notes (Signed)
UR completed 

## 2014-11-19 NOTE — Progress Notes (Signed)
Patient ID: Victoria [name], female   DOB: 1927/02/28, 81 y.o.   MRN: [id number]  1 Day Post-Op Subjective: Pt doing well s/p TURBT yesterday.  No complaints this am.  Objective: Vital signs in last 24 hours: Temp:  [97.6 F (36.4 C)-98.9 F (37.2 C)] 98.9 F (37.2 C) (04/26 0525) Pulse Rate:  [68-106] 106 (04/26 0525) Resp:  [11-20] 20 (04/26 0525) BP: (87-125)/(57-81) 125/75 mmHg (04/26 0525) SpO2:  [92 %-100 %] 94 % (04/26 0525)  Intake/Output from previous day: 04/25 0701 - 04/26 0700 In: 3622.5 [P.O.:940; I.V.:1182.5] Out: 4600 [Urine:4600] Intake/Output this shift:    Physical Exam:  General: Alert and oriented Abdomen: Soft, ND GU: Urine clear  Lab Results:  Recent Labs  11/18/14 0722  HGB 13.6  HCT 40.0   BMET  Recent Labs  11/18/14 0722  NA 140  K 3.8  GLUCOSE 113*     Studies/Results: No results found.  Assessment/Plan: - D/C catheter - D/C home     Staisha Winiarski,LES 11/19/2014, 7:30 AM

## 2014-12-26 ENCOUNTER — Ambulatory Visit (HOSPITAL_BASED_OUTPATIENT_CLINIC_OR_DEPARTMENT_OTHER): Payer: Medicare Other | Admitting: *Deleted

## 2014-12-26 ENCOUNTER — Ambulatory Visit (HOSPITAL_BASED_OUTPATIENT_CLINIC_OR_DEPARTMENT_OTHER): Payer: Medicare Other | Admitting: Nurse Practitioner

## 2014-12-26 ENCOUNTER — Telehealth: Payer: Self-pay | Admitting: Oncology

## 2014-12-26 VITALS — BP 153/70 | HR 72 | Temp 98.1°F | Resp 18 | Ht 61.0 in | Wt 143.3 lb

## 2014-12-26 DIAGNOSIS — D500 Iron deficiency anemia secondary to blood loss (chronic): Secondary | ICD-10-CM

## 2014-12-26 DIAGNOSIS — R319 Hematuria, unspecified: Secondary | ICD-10-CM

## 2014-12-26 DIAGNOSIS — C569 Malignant neoplasm of unspecified ovary: Secondary | ICD-10-CM

## 2014-12-26 DIAGNOSIS — C561 Malignant neoplasm of right ovary: Secondary | ICD-10-CM

## 2014-12-26 DIAGNOSIS — C7911 Secondary malignant neoplasm of bladder: Secondary | ICD-10-CM

## 2014-12-26 DIAGNOSIS — R310 Gross hematuria: Secondary | ICD-10-CM

## 2014-12-26 LAB — CBC WITH DIFFERENTIAL/PLATELET
BASO%: 0.5 % (ref 0.0–2.0)
Basophils Absolute: 0 10*3/uL (ref 0.0–0.1)
EOS ABS: 0.1 10*3/uL (ref 0.0–0.5)
EOS%: 2.8 % (ref 0.0–7.0)
HCT: 38.5 % (ref 34.8–46.6)
HGB: 12.7 g/dL (ref 11.6–15.9)
LYMPH%: 21.8 % (ref 14.0–49.7)
MCH: 31.4 pg (ref 25.1–34.0)
MCHC: 33 g/dL (ref 31.5–36.0)
MCV: 95.3 fL (ref 79.5–101.0)
MONO#: 0.3 10*3/uL (ref 0.1–0.9)
MONO%: 7.9 % (ref 0.0–14.0)
NEUT#: 2.9 10*3/uL (ref 1.5–6.5)
NEUT%: 67 % (ref 38.4–76.8)
Platelets: 161 10*3/uL (ref 145–400)
RBC: 4.04 10*6/uL (ref 3.70–5.45)
RDW: 13.9 % (ref 11.2–14.5)
WBC: 4.3 10*3/uL (ref 3.9–10.3)
lymph#: 0.9 10*3/uL (ref 0.9–3.3)

## 2014-12-26 LAB — URINALYSIS, MICROSCOPIC - CHCC
BILIRUBIN (URINE): NEGATIVE
GLUCOSE UR CHCC: NEGATIVE mg/dL
KETONES: NEGATIVE mg/dL
NITRITE: NEGATIVE
PH: 6 (ref 4.6–8.0)
Protein: <30 mg/dL
SPECIFIC GRAVITY, URINE: 1.005 (ref 1.003–1.035)
Urobilinogen, UR: 0.2 mg/dL (ref 0.2–1)

## 2014-12-26 NOTE — Telephone Encounter (Signed)
Gave and printed appt sched and avs for pt for SEpt °

## 2014-12-26 NOTE — Progress Notes (Signed)
  [facility] OFFICE PROGRESS NOTE   Diagnosis:  Ovarian cancer  INTERVAL HISTORY:   Ms. Kerrigan returns as scheduled. She feels well. She noted a small amount of blood with urination today. Otherwise she has had no bleeding since the TURBT in April. She has a good appetite. She denies pain. No constipation or diarrhea. She has occasional mild shortness of breath.  Objective:  Vital signs in last 24 hours:  Blood pressure 153/70, pulse 72, temperature 98.1 F (36.7 C), temperature source Oral, resp. rate 18, height 5\' 1"  (1.549 m), weight 143 lb 4.8 oz (65 kg).    HEENT: No thrush or ulcers. Lymphatics: No palpable cervical, supraclavicular or inguinal lymph nodes. Resp: Breath sounds diminished right lower lung field. Lungs are clear. No respiratory distress. Cardio: Regular rate and rhythm. GI: Abdomen soft and nontender. No organomegaly. Left lower quadrant colostomy. Vascular: No leg edema.  Lab Results:  Lab Results  Component Value Date   WBC 4.3 12/26/2014   HGB 12.7 12/26/2014   HCT 38.5 12/26/2014   MCV 95.3 12/26/2014   PLT 161 12/26/2014   NEUTROABS 2.9 12/26/2014    Imaging:  No results found.  Medications: I have reviewed the patient's current medications.  Assessment/Plan: 1. Ovarian cancer-presenting with a rectal mass , status post sigmoidoscopy 03/13/2012 with findings of a 5 cm malignant appearing mass with central ulceration in the rectum 2-3 cm proximal to the anal verge. Pathology showed moderate to poorly differentiated adenocarcinoma with ulceration and prolapse changes. By immunohistochemistry the malignant cells were positive for cytokeratin 7, estrogen receptor and WT-1. They were negative for cytokeratin 20 and CDX-2. This immunohistochemical profile was strongly suggestive of a gynecologic primary. -Status post 3 cycles of Taxol/carboplatin chemotherapy with normalization of the CA 125, restaging CT 08/07/2012 with marked  improvement in the pelvic masses and no evidence of progressive ovarian cancer. She completed cycle 4 of Taxol/carboplatin beginning 08/08/2012. 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 She completed cycle 6 Taxol/carboplatin beginning 11/21/2012. 2. History of Atrial fibrillation  3. Status post Port-A-Cath placement 05/04/2012. Port-A-Cath removal 02/21/2013 4. 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 5. Admission 09/02/2013 with gross hematuria-right pelvic mass with involvement of the bladder confirmed on CT with a cystoscopy confirming a large tumor in the bladder with associated bleeding, status post TURBT and fulguration, pathology consistent with a high-grade carcinoma. Immunohistochemical stains returned positive for cytokeratin 7 and WT-1, and negative for cytokeratin 20 and cytokeratin 903. She completed a course of palliative radiation 09/13/2013 through 10/22/2013.  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 History of anemia secondary to bleeding. She continues iron.   Disposition: Victoria [name] appears stable. The gross hematuria markedly improved following the TURBT procedure in April of this year. She continues follow-up with Dr. Diona Fanti. She does not wish to consider chemotherapy. She will return for a follow-up visit in 3-4 months.  Plan reviewed with Dr. Benay Spice.    Ned Card ANP/GNP-BC   12/26/2014  10:57 AM

## 2014-12-27 ENCOUNTER — Encounter: Payer: Self-pay | Admitting: *Deleted

## 2014-12-27 ENCOUNTER — Telehealth: Payer: Self-pay | Admitting: *Deleted

## 2014-12-27 LAB — CA 125: CA 125: 11 U/mL (ref <35)

## 2014-12-27 NOTE — Progress Notes (Signed)
[facility] Psychosocial Distress Screening Clinical Social Work  Clinical Social Work was referred by distress screening protocol.  The patient scored a 8 on the Psychosocial Distress Thermometer which indicates severe distress. Clinical Social Worker phoned pt Victoria [name],   daughter's number was not listed in [place]) to assess for distress and other psychosocial needs. Pt reports she is doing well currently and denies being anxious or nervous. She feels like all of her concerns were addressed at her appointment re pain and other symptoms. Pt reports to have great support and denies no needs currently. She agrees to seek out CSW team as needed.   ONCBCN DISTRESS SCREENING 12/26/2014  Screening Type Initial Screening  Distress experienced in past week (1-10) 8  Emotional problem type Nervousness/Anxiety;Adjusting to illness  Physical Problem type Pain;Nausea/vomiting;Sleep/insomnia;Getting around;Breathing  Physician notified of physical symptoms Yes  Referral to clinical social work Yes  Other May Contact Daughter [place] Social Worker follow up needed: No.  If yes, follow up plan: [name], [name]  [facility] Phone: [phone] Fax: [phone]

## 2014-12-27 NOTE — Progress Notes (Signed)
Called and informed patient that CA 125 is normal. Per Dr. Benay Spice.  Patient verbalized understanding.

## 2014-12-27 NOTE — Telephone Encounter (Signed)
-----   Message from Victoria Pier, MD sent at 12/27/2014  2:31 PM EDT ----- Please call patient, ca 125 is normal

## 2014-12-27 NOTE — Telephone Encounter (Signed)
Encounter opened in error

## 2014-12-31 ENCOUNTER — Ambulatory Visit (INDEPENDENT_AMBULATORY_CARE_PROVIDER_SITE_OTHER): Payer: Medicare Other | Admitting: Cardiovascular Disease

## 2014-12-31 ENCOUNTER — Encounter: Payer: Self-pay | Admitting: Cardiovascular Disease

## 2014-12-31 VITALS — BP 104/62 | HR 60 | Ht 61.0 in | Wt 143.1 lb

## 2014-12-31 DIAGNOSIS — I480 Paroxysmal atrial fibrillation: Secondary | ICD-10-CM

## 2014-12-31 DIAGNOSIS — I471 Supraventricular tachycardia: Secondary | ICD-10-CM | POA: Diagnosis not present

## 2014-12-31 DIAGNOSIS — Z950 Presence of cardiac pacemaker: Secondary | ICD-10-CM | POA: Diagnosis not present

## 2014-12-31 LAB — CUP PACEART INCLINIC DEVICE CHECK
Battery Impedance: 1500 Ohm
Battery Voltage: 2.76 V
Brady Statistic RA Percent Paced: 73 %
Brady Statistic RV Percent Paced: 1.1 %
Date Time Interrogation Session: 20160607132910
Lead Channel Impedance Value: 395 Ohm
Lead Channel Pacing Threshold Amplitude: 1 V
Lead Channel Pacing Threshold Amplitude: 1.25 V
Lead Channel Sensing Intrinsic Amplitude: 5.3 mV
Lead Channel Setting Pacing Amplitude: 2.5 V
Lead Channel Setting Pacing Pulse Width: 0.4 ms
Lead Channel Setting Sensing Sensitivity: 1.5 mV
MDC IDC MSMT LEADCHNL RA IMPEDANCE VALUE: 376 Ohm
MDC IDC MSMT LEADCHNL RA PACING THRESHOLD PULSEWIDTH: 0.4 ms
MDC IDC MSMT LEADCHNL RA SENSING INTR AMPL: 1.1 mV
MDC IDC MSMT LEADCHNL RV PACING THRESHOLD PULSEWIDTH: 0.4 ms
MDC IDC PG SERIAL: 2195755
MDC IDC SET LEADCHNL RA PACING AMPLITUDE: 2 V
Pulse Gen Model: 5826

## 2014-12-31 NOTE — Progress Notes (Signed)
Patient ID: Victoria [name], female   DOB: 10/26/1926, 81 y.o.   MRN: [id number]      Cardiology Office Note   Date:  12/31/2014   ID:  Victoria [name], DOB 1926-10-17, MRN [id number]  PCP:  Donnajean Lopes, MD  Cardiologist:   Sanda Klein, MD   Chief Complaint  Patient presents with  . Follow-up    3 month:  No complaints.      History of Present Illness: Victoria [name] is a 81 y.o. female who presents for atrial tachyarrhythmia and pacemaker check  She had persistent atrial flutter that was difficult to rate control when she had problems with bowel obstruction related to pelvic carcinoma. Over the winter, she spent several months in atrial flutter or atrial fibrillation and then spontaneously converted to normal rhythm early this year. She was again in atrial flutter, almost continuously from early February until April 27, by interrogation of her pacemaker ([employer], 2010, normal lead parameters, estimated longevity 3.75-6.25 years).   Quite remarkably, she has not had any atrial fibrillation since April 27.  Otherwise, there has been 73% atrial pacing and 1.1% ventricular pacing. During all these times she has not had episodes of stroke or other notable embolic events, despite the fact that she is not receiving any stroke prevention medication. Anticoagulants and even aspirin had to be stopped because of recurrent hematuria. She had bladder surgery December 3 for recurrent cancer and a TURBT procedure in April for persistent gross hematuria. Since then she has much less urinary bleeding.  Past Medical History  Diagnosis Date  . Hyperlipidemia   . Diverticulosis   . Colostomy in place   . PONV (postoperative nausea and vomiting)   . Chronic anticoagulation   . Fluid collection (edema) in the arms, legs, hands and feet   . Urinary frequency     with burning x 2 days  . Hypertension   . RBBB   . PAT (paroxysmal atrial tachycardia)   . Coronary artery disease     DR. Teyana Pierron IS PT'S CARDIOLOGIST  . Anxiety   . Iron deficiency anemia   . Gross hematuria   . Cardiac pacemaker in [place]-- PLACEMENT 09-10-2008  . Paroxysmal atrial flutter     CARDIOLOGIST--  DR Raelynne Ludwick  . SSS (sick sinus syndrome)     S/P PACEMAKER  . Bladder cancer recurrent bladder tumor     secondarty to ovarian cancer--  palliative radiation 09-13-2013 to 10-22-2013  . Malignant neoplasm of ovary ONOCOLOGIST--  DR Benay Spice  03-13-2012  w/ rectal mass    dx  primay ovarian cancer , +cytokeratin 7, estrogen receptor and WT-1--  chemotherapy and s/p omentectomy and bilateral oophorectomy03-05-2013    Past Surgical History  Procedure Laterality Date  . Permanent pacemaker insertion  09-10-2008    [employer]  . Rib removed  1934    Pleurisy and pneumonia  . Bladder neck suspension  1986  . Colon resection  04/04/2012    Procedure: COLON RESECTION;  Surgeon: Rolm Bookbinder, MD;  Location: [facility];  Service: General;  Laterality: N/A;  laparotomy with small bowel resection for obstruction with colostomy with gastrostomy tube placement.   . Portacath placement  05/04/2012    Procedure: INSERTION PORT-A-CATH;  Surgeon: Rolm Bookbinder, MD;  Location: [facility];  Service: General;  Laterality: Right;  . Laparotomy Bilateral 10/03/2012    Procedure: EXPLORATORY LAPAROTOMY;  Surgeon: Imagene Gurney A. Alycia Rossetti, MD;  Location: [facility];  Service: Gynecology;  Laterality: Bilateral;  WITH BSO, TUMOR DEBULKING  . Colostomy revision N/A 10/03/2012    Procedure: COLOSTOMY REVISION;  Surgeon: Rolm Bookbinder, MD;  Location: [facility];  Service: General;  Laterality: N/A;  COLOSTOMY REVISION, Sigmoid colectomy  . Port-a-cath removal N/A 02/21/2013    Procedure: REMOVAL PORT-A-CATH;  Surgeon: Rolm Bookbinder, MD;  Location: [facility];  Service: General;  Laterality: N/A;  . Transurethral resection of bladder tumor with gyrus (turbt-gyrus) N/A 09/05/2013    Procedure: TRANSURETHRAL RESECTION OF BLADDER  TUMOR WITH GYRUS (TURBT-GYRUS);  Surgeon: Fredricka Bonine, MD;  Location: [facility];  Service: Urology;  Laterality: N/A;  . Transurethral resection of bladder tumor N/A 06/27/2014    Procedure: TRANSURETHRAL RESECTION OF BLADDER TUMOR (TURBT);  Surgeon: Jorja Loa, MD;  Location: [facility];  Service: Urology;  Laterality: N/A;  . Cystoscopy N/A 06/27/2014    Procedure: CYSTOSCOPY;  Surgeon: Jorja Loa, MD;  Location: [facility];  Service: Urology;  Laterality: N/A;  . Abdominal hysterectomy  1972  . Cardiac electrophysiology mapping and ablation  05-21-2008   dr Caryl Comes    a-flutter  . Electrophysiology study  09-26-2008  dr Caryl Comes  03-27-2012  dr Habiba Treloar    cardiac ,  no ablation in 2010/   2013  overdrive pacing atrial flutter  . Transthoracic echocardiogram  07-17-2008      ef >55%,  mild to moderate MR and TR,  mild  sclerotic AV without stenosis,    . Cardiovascular stress test  07-17-2008  dr Dauntae Derusha    normal myocardial perfusion w/ attenuation artifact in the anterior region of mycardium;  no ischemia or infarct/scar/  low risk scan/  normal LV function and wall motion , ef  83%  . Transurethral resection of bladder tumor N/A 11/18/2014    Procedure: TRANSURETHRAL RESECTION OF BLADDER TUMOR (TURBT);  Surgeon: Franchot Gallo, MD;  Location: [facility];  Service: Urology;  Laterality: N/A;  . Cystoscopy N/A 11/18/2014    Procedure: CYSTOSCOPY;  Surgeon: Franchot Gallo, MD;  Location: [facility];  Service: Urology;  Laterality: N/A;     Current Outpatient Prescriptions  Medication Sig Dispense Refill  . diltiazem (CARDIZEM CD) 360 MG 24 hr capsule Take 1 capsule (360 mg total) by mouth daily. (Patient taking differently: Take 360 mg by mouth every morning. ) 90 capsule 3  . iron polysaccharides (NIFEREX) 150 MG capsule Take 1 capsule (150 mg total) by mouth daily. (Patient taking differently: Take 150 mg by mouth every evening. ) 30  capsule 6  . lactose free nutrition (BOOST) LIQD Take 237 mLs by mouth 2 (two) times a week.      No current facility-administered medications for this visit.   Facility-Administered Medications Ordered in Other Visits  Medication Dose Route Frequency Provider Last Rate Last Dose  . heparin 6,000 Units in sodium chloride irrigation 0.9 % 500 mL irrigation   Irrigation Once Rolm Bookbinder, MD      . sodium chloride 0.9 % injection 10 mL  10 mL Intracatheter PRN Ladell Pier, MD   10 mL at 07/18/12 1248    Allergies:   Pindolol; Iodine; Shellfish allergy; Vibramycin; Doxycycline; Levofloxacin; Multaq; and Vicodin    Social History:  The patient  reports that she has never smoked. She has never used smokeless tobacco. She reports that she does not drink alcohol or use illicit drugs.   Family History:  The patient's family history includes Heart disease in her  mother.    ROS:  Please see the history of present illness.    Otherwise, review of systems positive for none.   All other systems are reviewed and negative.    PHYSICAL EXAM: VS:  BP 104/62 mmHg  Pulse 60  Ht 5\' 1"  (1.549 m)  Wt 64.91 kg (143 lb 1.6 oz)  BMI 27.05 kg/m2 , BMI Body mass index is 27.05 kg/(m^2).  General: Alert, oriented x3, no distress Head: no evidence of trauma, PERRL, EOMI, no exophtalmos or lid lag, no myxedema, no xanthelasma; normal ears, nose and oropharynx Neck: normal jugular venous pulsations and no hepatojugular reflux; brisk carotid pulses without delay and no carotid bruits Chest: clear to auscultation, no signs of consolidation by percussion or palpation, normal fremitus, symmetrical and full respiratory excursions Cardiovascular: normal position and quality of the apical impulse, regular rhythm, normal first and second heart sounds, no murmurs, rubs or gallops Abdomen: no tenderness or distention, no masses by palpation, no abnormal pulsatility or arterial bruits, normal bowel sounds, no  hepatosplenomegaly Extremities: no clubbing, cyanosis or edema; 2+ radial, ulnar and brachial pulses bilaterally; 2+ right femoral, posterior tibial and dorsalis pedis pulses; 2+ left femoral, posterior tibial and dorsalis pedis pulses; no subclavian or femoral bruits Neurological: grossly nonfocal Psych: euthymic mood, full affect   EKG:  EKG is not ordered today. Recent Labs: 06/25/2014: BUN 15; Creatinine 0.99 11/18/2014: Potassium 3.8; Sodium 140 12/26/2014: Hemoglobin 12.7; Platelets 161    Lipid Panel    Component Value Date/Time   CHOL 115 04/10/2012 0520   TRIG 80 04/10/2012 0520   HDL 43 05/15/2008 0620   CHOLHDL 3.3 05/15/2008 0620   VLDL 17 05/15/2008 0620   LDLCALC  05/15/2008 0620    82        Total Cholesterol/HDL:CHD Risk Coronary Heart Disease Risk Table                     Men   Women  1/2 Average Risk   3.4   3.3      Wt Readings from Last 3 Encounters:  12/31/14 64.91 kg (143 lb 1.6 oz)  12/26/14 65 kg (143 lb 4.8 oz)  10/03/14 65.862 kg (145 lb 3.2 oz)       ASSESSMENT AND PLAN:  1. Persistent atrial flutter and atrial fibrillation - quite remarkably she has had no arrhythmia in the last 6 weeks or so. She has not been able to take either aspirin or anticoagulate due to recurrent urinary bleeding. No change in medications. When she had arrhythmia, the Cardizem 360 mg daily appeared to provide satisfactory rate control. The risk of stroke remains high (CHADSVasc2 score is 4), but she has no interest in starting anticoagulate at this time.  2. Normal pacemaker check - she is not pacemaker dependent. Her device is not amenable to remote monitoring.   Current medicines are reviewed at length with the patient today.  The patient does not have concerns regarding medicines.  The following changes have been made:  no change  Labs/ tests ordered today include:  Orders Placed This Encounter  Procedures  . Implantable device check    Patient Instructions    Dr Sallyanne Kuster recommends that you schedule a follow-up appointment in 6 months with device check. You will receive a reminder letter in the mail two months in advance. If you don't receive a letter, please call our office to schedule the follow-up appointment.    Mikael Spray, MD  12/31/2014 4:34  PM    Sanda Klein, MD, [facility] [phone] office [phone] pager

## 2014-12-31 NOTE — Patient Instructions (Signed)
Dr Sallyanne Kuster recommends that you schedule a follow-up appointment in 6 months with device check. You will receive a reminder letter in the mail two months in advance. If you don't receive a letter, please call our office to schedule the follow-up appointment.

## 2015-01-01 NOTE — Discharge Summary (Signed)
Patient ID: Victoria [name] MRN: [id number] DOB/AGE: 1927/03/01 81 y.o.  Admit date: 11/18/2014 Discharge date: 01/01/2015  Primary Care Physician:  Donnajean Lopes, MD  Discharge Diagnoses:   Present on Admission:  . Cancer involving bladder by direct extension from ovary  Consults:  None   Discharge Medications:   Medication List    TAKE these medications        diltiazem 360 MG 24 hr capsule  Commonly known as:  CARDIZEM CD  Take 1 capsule (360 mg total) by mouth daily.     iron polysaccharides 150 MG capsule  Commonly known as:  NIFEREX  Take 1 capsule (150 mg total) by mouth daily.     lactose free nutrition Liqd  Take 237 mLs by mouth 2 (two) times a week.         Significant Diagnostic Studies:  No results found.  Brief H and P: For complete details please refer to admission H and P, but in brief the patient is admitted for TUR-BT/resection of a bladder lesion felt to be contiguous with a known ovarian primary  Hospital Course:  Active Problems:   Cancer involving bladder by direct extension from ovary   Day of Discharge BP 125/75 mmHg  Pulse 106  Temp(Src) 98.9 F (37.2 C) (Oral)  Resp 20  Ht 5\' 1"  (1.549 m)  Wt 65.318 kg (144 lb)  BMI 27.22 kg/m2  SpO2 94%  No results found for this or any previous visit (from the past 24 hour(s)).  Physical Exam: General: Alert and awake oriented x3 not in any acute distress. HEENT: anicteric sclera, pupils reactive to light and accommodation CVS: S1-S2 clear no murmur rubs or gallops Chest: clear to auscultation bilaterally, no wheezing rales or rhonchi Abdomen: soft nontender, nondistended, normal bowel sounds, no organomegaly Extremities: no cyanosis, clubbing or edema noted bilaterally Neuro: Cranial nerves II-XII intact, no focal neurological deficits  Disposition:  Home  Diet:  No restrictions  Activity:  Gradually increase   Disposition and Follow-up:    Followup in office  TESTS THAT NEED  FOLLOW-UP  Path review  DISCHARGE FOLLOW-UP     Follow-up Information    Follow up with Jorja Loa, MD On 01/17/2015.   Specialty:  Urology   Why:  10 AM   Contact information:   [place] [address] [phone]       Time spent on Discharge:  10 minutes  Signed: Jorja Loa 01/01/2015, 6:13 AM

## 2015-01-17 ENCOUNTER — Encounter: Payer: Self-pay | Admitting: Cardiovascular Disease

## 2015-03-05 ENCOUNTER — Other Ambulatory Visit: Payer: Self-pay

## 2015-03-05 DIAGNOSIS — Z1231 Encounter for screening mammogram for malignant neoplasm of breast: Secondary | ICD-10-CM

## 2015-03-17 ENCOUNTER — Other Ambulatory Visit: Payer: Self-pay | Admitting: Obstetrics & Gynecology

## 2015-03-18 LAB — CYTOLOGY - PAP

## 2015-04-07 ENCOUNTER — Ambulatory Visit
Admission: RE | Admit: 2015-04-07 | Discharge: 2015-04-07 | Disposition: A | Discharge status: Home / Self Care | Payer: Medicare Other | Source: Ambulatory Visit

## 2015-04-07 DIAGNOSIS — Z1231 Encounter for screening mammogram for malignant neoplasm of breast: Secondary | ICD-10-CM

## 2015-04-24 ENCOUNTER — Other Ambulatory Visit (HOSPITAL_BASED_OUTPATIENT_CLINIC_OR_DEPARTMENT_OTHER): Payer: Medicare Other

## 2015-04-24 ENCOUNTER — Telehealth: Payer: Self-pay | Admitting: Oncology

## 2015-04-24 ENCOUNTER — Ambulatory Visit (HOSPITAL_BASED_OUTPATIENT_CLINIC_OR_DEPARTMENT_OTHER): Payer: Medicare Other | Admitting: Oncology

## 2015-04-24 VITALS — BP 131/74 | HR 96 | Temp 97.7°F | Resp 18 | Ht 61.0 in | Wt 140.2 lb

## 2015-04-24 DIAGNOSIS — C561 Malignant neoplasm of right ovary: Secondary | ICD-10-CM

## 2015-04-24 DIAGNOSIS — D500 Iron deficiency anemia secondary to blood loss (chronic): Secondary | ICD-10-CM | POA: Diagnosis not present

## 2015-04-24 DIAGNOSIS — C7911 Secondary malignant neoplasm of bladder: Secondary | ICD-10-CM

## 2015-04-24 DIAGNOSIS — C569 Malignant neoplasm of unspecified ovary: Secondary | ICD-10-CM

## 2015-04-24 DIAGNOSIS — Z23 Encounter for immunization: Secondary | ICD-10-CM | POA: Diagnosis not present

## 2015-04-24 DIAGNOSIS — C785 Secondary malignant neoplasm of large intestine and rectum: Secondary | ICD-10-CM

## 2015-04-24 LAB — URINALYSIS, MICROSCOPIC - CHCC
Bilirubin (Urine): NEGATIVE
Glucose: NEGATIVE mg/dL
Ketones: NEGATIVE mg/dL
Nitrite: NEGATIVE
Protein: 300 mg/dL
SPECIFIC GRAVITY, URINE: 1.02 (ref 1.003–1.035)
UROBILINOGEN UR: 0.2 mg/dL (ref 0.2–1)
pH: 6 (ref 4.6–8.0)

## 2015-04-24 LAB — CBC WITH DIFFERENTIAL/PLATELET
BASO%: 0.5 % (ref 0.0–2.0)
BASOS ABS: 0 10*3/uL (ref 0.0–0.1)
EOS ABS: 0.1 10*3/uL (ref 0.0–0.5)
EOS%: 1.9 % (ref 0.0–7.0)
HCT: 40.4 % (ref 34.8–46.6)
HGB: 13.6 g/dL (ref 11.6–15.9)
LYMPH#: 1 10*3/uL (ref 0.9–3.3)
LYMPH%: 17.1 % (ref 14.0–49.7)
MCH: 31.7 pg (ref 25.1–34.0)
MCHC: 33.6 g/dL (ref 31.5–36.0)
MCV: 94.2 fL (ref 79.5–101.0)
MONO#: 0.5 10*3/uL (ref 0.1–0.9)
MONO%: 9 % (ref 0.0–14.0)
NEUT#: 4.1 10*3/uL (ref 1.5–6.5)
NEUT%: 71.5 % (ref 38.4–76.8)
Platelets: 186 10*3/uL (ref 145–400)
RBC: 4.28 10*6/uL (ref 3.70–5.45)
RDW: 14.2 % (ref 11.2–14.5)
WBC: 5.7 10*3/uL (ref 3.9–10.3)

## 2015-04-24 MED ORDER — INFLUENZA VAC SPLIT QUAD 0.5 ML IM SUSY
0.5000 mL | PREFILLED_SYRINGE | Freq: Once | INTRAMUSCULAR | Status: AC
  Administered 2015-04-24: 0.5 mL via INTRAMUSCULAR
  Filled 2015-04-24: qty 0.5

## 2015-04-24 NOTE — Telephone Encounter (Signed)
Gave and printed appt sched and avs for pt for March 2017 °

## 2015-04-24 NOTE — Progress Notes (Signed)
  [facility] OFFICE PROGRESS NOTE   Diagnosis: Ovarian cancer  INTERVAL HISTORY:   Victoria [name] returns as scheduled. She feels well. Good appetite and energy level. No pain. The colostomy is functioning well. She has intermittent episodes of gross hematuria. She is followed by Victoria [name].  Objective:  Vital signs in last 24 hours:  Blood pressure 131/74, pulse 96, temperature 97.7 F (36.5 C), temperature source Oral, resp. rate 18, height 5\' 1"  (1.549 m), weight 140 lb 3.2 oz (63.594 kg), SpO2 96 %.    HEENT: Neck without mass Lymphatics: No cervical, supraclavicular, axillary, or inguinal nodes Resp: Lungs clear bilaterally Cardio: Regular rate and rhythm GI: No hepatomegaly, left lower quadrant colostomy with a parastomal hernia, no mass, nontender Vascular: No leg edema   Lab Results:  Lab Results  Component Value Date   WBC 5.7 04/24/2015   HGB 13.6 04/24/2015   HCT 40.4 04/24/2015   MCV 94.2 04/24/2015   PLT 186 04/24/2015   NEUTROABS 4.1 04/24/2015     12/26/2014-CA 125    11  04/24/2015: Urinalysis-moderate bacteria, 21-50 red cells, 21-50 white cells   Medications: I have reviewed the patient's current medications.  Assessment/Plan: 1. Ovarian cancer-presenting with a rectal mass , status post sigmoidoscopy 03/13/2012 with findings of a 5 cm malignant appearing mass with central ulceration in the rectum 2-3 cm proximal to the anal verge. Pathology showed moderate to poorly differentiated adenocarcinoma with ulceration and prolapse changes. By immunohistochemistry the malignant cells were positive for cytokeratin 7, estrogen receptor and WT-1. They were negative for cytokeratin 20 and CDX-2. This immunohistochemical profile was strongly suggestive of a gynecologic primary. -Status post 3 cycles of Taxol/carboplatin chemotherapy with normalization of the CA 125, restaging CT 08/07/2012 with marked improvement in the pelvic masses and no evidence  of progressive ovarian cancer. She completed cycle 4 of Taxol/carboplatin beginning 08/08/2012. 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 She completed cycle 6 Taxol/carboplatin beginning 11/21/2012. 2. History of Atrial fibrillation  3. Status post Port-A-Cath placement 05/04/2012. Port-A-Cath removal 02/21/2013 4. 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 5. Admission 09/02/2013 with gross hematuria-right pelvic mass with involvement of the bladder confirmed on CT with a cystoscopy confirming a large tumor in the bladder with associated bleeding, status post TURBT and fulguration, pathology consistent with a high-grade carcinoma. Immunohistochemical stains returned positive for cytokeratin 7 and WT-1, and negative for cytokeratin 20 and cytokeratin 903. She completed a course of palliative radiation 09/13/2013 through 10/22/2013.  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 History of anemia secondary to bleeding. She continues iron. The hematoma is normal     Disposition:  [name] appears well. The intermittent hematuria is likely related to ovarian cancer involving the bladder. She otherwise appears asymptomatic. No evidence of systemic disease progression. The urinalysis findings are likely related to tumor in the bladder. She does not have symptoms of a urinary tract infection.  She will contact Dr. Ishmael [name] if the hematuria progresses.  She will return for an office and lab visit in 6 months.  Victoria Coder, MD  04/24/2015  10:28 AM

## 2015-04-25 ENCOUNTER — Telehealth: Payer: Self-pay | Admitting: *Deleted

## 2015-04-25 LAB — CA 125: CA 125: 11 U/mL (ref <35)

## 2015-04-25 NOTE — Telephone Encounter (Signed)
-----   Message from Victoria Pier, MD sent at 04/25/2015  4:03 PM EDT ----- Please call patient, ca125 is normal

## 2015-04-25 NOTE — Telephone Encounter (Signed)
Left message on voicemail for pt to call the office for lab results. CA125 normal. UA showed blood and mod bacteria, pt will need to call office or Dr. Diona Fanti if she develops symptoms of UTI.

## 2015-04-28 ENCOUNTER — Telehealth: Payer: Self-pay | Admitting: *Deleted

## 2015-04-28 NOTE — Telephone Encounter (Signed)
Per Dr. Benay Spice; notified pt that ca 125 is normal; also UA showed blood and mod bacteria, pt will need to call office or Dr. Diona Fanti if she develops symptoms of UTI.  Pt verbalized understanding and expressed appreciation for call back.

## 2015-04-28 NOTE — Telephone Encounter (Signed)
-----   Message from Ladell Pier, MD sent at 04/25/2015  4:03 PM EDT ----- Please call patient, ca125 is normal

## 2015-06-09 ENCOUNTER — Telehealth: Payer: Self-pay | Admitting: Cardiovascular Disease

## 2015-06-09 DIAGNOSIS — M7989 Other specified soft tissue disorders: Secondary | ICD-10-CM

## 2015-06-09 DIAGNOSIS — Z79899 Other long term (current) drug therapy: Secondary | ICD-10-CM

## 2015-06-09 MED ORDER — FUROSEMIDE 20 MG PO TABS: 20.0000 mg | ORAL_TABLET | Freq: Every day | ORAL | Status: DC

## 2015-06-09 MED ORDER — POTASSIUM CHLORIDE ER 10 MEQ PO TBCR: 10.0000 meq | EXTENDED_RELEASE_TABLET | Freq: Every day | ORAL | Status: DC

## 2015-06-09 NOTE — Telephone Encounter (Signed)
Have her do a pacemaker download (she was due one in Nov) and schedule an echo for dyspnea. Check BMET. Start furosemide 20 mg daily and KCl 10 mEq daily. Please weigh daily and bring log to office. Call back if edema not improving in 2-3 days. Keep scheduled appt 12/6 Thanks, Nate

## 2015-06-09 NOTE — Telephone Encounter (Signed)
Instructed pt how to send remote transmission w/ home monitor.

## 2015-06-09 NOTE — Telephone Encounter (Signed)
Discussed recommendations w/ patient in thorough detail. Echo ordered. labwork ordered. meds ordered.  Pt does not know if she's certain of how to do PM download - I will forward to device pool for assistance w/ this.  Advised pt to call if no improvement, or if further clarification needed on instructions, keep appt as scheduled.

## 2015-06-09 NOTE — Telephone Encounter (Signed)
Pt experiencing swelling since Saturday night. She states she noticed "all of a sudden". Ankles and calves. She notes it has not progressed to be worse since Saturday night.  She is not SOB acutely, but states she has noted some slight SOB for last 3-4 weeks. She denies CP, fatigue, pain, other symptoms. She acknowledges AFib history, notes she does not feel HR is out of rhythm. Currently compliant w/ meds as reported.  She last had 2d echo done 3 years ago - last time she had swelling, she notes. That episode of swelling lasted 3-4 days then resolved. At the time, thought it to be result of chemotherapy. She still undergoes cancer treatments. This is the first time this swelling has recurred.  Pt has f/u w/ Dr. Sallyanne Kuster on 12/6.  Informed pt I would defer to him for advice.

## 2015-06-09 NOTE — Telephone Encounter (Signed)
Pt c/o swelling: STAT is pt has developed SOB within 24 hours  1. How long have you been experiencing swelling? Since 11/12 evening   2. Where is the swelling located? Then both feet, ankles and legs.. Now just ankles( she says that she feels a little heat in the rt ankle)  3.  Are you currently taking a "fluid pill"? No   4.  Are you currently SOB? No   5.  Have you traveled recently?no

## 2015-06-10 ENCOUNTER — Telehealth: Payer: Self-pay | Admitting: Cardiovascular Disease

## 2015-06-10 NOTE — Telephone Encounter (Signed)
Instructed pt to call [facility] pt services. Pt verbalized understanding.

## 2015-06-10 NOTE — Telephone Encounter (Signed)
Please call,concerning her pacemaker. [employer] says she was not in their system.

## 2015-06-10 NOTE — Telephone Encounter (Signed)
Please call,says she was unable to get her pacemaker check.Says she needs talk to only you.

## 2015-06-10 NOTE — Telephone Encounter (Signed)
Victoria [name] will schedule the echo next week at the [facility] office.  Will instruct patient to get labs done at [facility] on [address] to [place] to see if they can get her set up for home transmission.

## 2015-06-23 ENCOUNTER — Other Ambulatory Visit: Payer: Self-pay

## 2015-06-23 ENCOUNTER — Other Ambulatory Visit: Payer: Self-pay | Admitting: Cardiovascular Disease

## 2015-06-23 ENCOUNTER — Ambulatory Visit (HOSPITAL_COMMUNITY): Payer: Medicare Other | Attending: Internal Medicine

## 2015-06-23 DIAGNOSIS — I071 Rheumatic tricuspid insufficiency: Secondary | ICD-10-CM | POA: Insufficient documentation

## 2015-06-23 DIAGNOSIS — I10 Essential (primary) hypertension: Secondary | ICD-10-CM | POA: Diagnosis not present

## 2015-06-23 DIAGNOSIS — M7989 Other specified soft tissue disorders: Secondary | ICD-10-CM | POA: Diagnosis present

## 2015-06-23 DIAGNOSIS — I517 Cardiomegaly: Secondary | ICD-10-CM | POA: Insufficient documentation

## 2015-06-23 DIAGNOSIS — I340 Nonrheumatic mitral (valve) insufficiency: Secondary | ICD-10-CM | POA: Diagnosis not present

## 2015-06-23 DIAGNOSIS — E785 Hyperlipidemia, unspecified: Secondary | ICD-10-CM | POA: Diagnosis not present

## 2015-06-27 LAB — BASIC METABOLIC PANEL
BUN: 12 mg/dL (ref 7–25)
CALCIUM: 8.7 mg/dL (ref 8.6–10.4)
CO2: 25 mmol/L (ref 20–31)
Chloride: 100 mmol/L (ref 98–110)
Creat: 1.02 mg/dL — ABNORMAL HIGH (ref 0.60–0.88)
Glucose, Bld: 105 mg/dL — ABNORMAL HIGH (ref 65–99)
POTASSIUM: 3.9 mmol/L (ref 3.5–5.3)
SODIUM: 135 mmol/L (ref 135–146)

## 2015-07-01 ENCOUNTER — Ambulatory Visit (INDEPENDENT_AMBULATORY_CARE_PROVIDER_SITE_OTHER): Payer: Medicare Other | Admitting: Cardiovascular Disease

## 2015-07-01 ENCOUNTER — Encounter: Payer: Self-pay | Admitting: Cardiovascular Disease

## 2015-07-01 VITALS — BP 94/64 | HR 67 | Ht 60.5 in | Wt 138.5 lb

## 2015-07-01 DIAGNOSIS — I480 Paroxysmal atrial fibrillation: Secondary | ICD-10-CM | POA: Diagnosis not present

## 2015-07-01 DIAGNOSIS — I471 Supraventricular tachycardia: Secondary | ICD-10-CM

## 2015-07-01 DIAGNOSIS — Z950 Presence of cardiac pacemaker: Secondary | ICD-10-CM

## 2015-07-01 MED ORDER — FUROSEMIDE 20 MG PO TABS: 20.0000 mg | ORAL_TABLET | Freq: Every day | ORAL | Status: DC | PRN

## 2015-07-01 NOTE — Progress Notes (Signed)
Patient ID: Victoria [name], female   DOB: Feb 01, 1927, 81 y.o.   MRN: [id number]     Cardiology Office Note   Date:  07/03/2015   ID:  Victoria, [name] 03-24-1927, MRN [id number]  PCP:  Victoria Lopes, MD  Cardiologist:   Victoria Klein, MD   Chief Complaint  Patient presents with  . Follow-up    no chest pain, no shortness of breath, has edema, no pain or cramping in legs, no lightheadedness or dizziness      History of Present Illness: Victoria [name] is a 81 y.o. female who presents for pacemaker and arrhythmia follow up.  She does not have CV complaints and denies any chest pain at rest exertion, dyspnea at rest or with exertion, orthopnea, paroxysmal nocturnal dyspnea, syncope, palpitations, focal neurological deficits, intermittent claudication, lower extremity edema, unexplained weight gain, cough, hemoptysis or wheezing.  She is managing to cope, but is dealing with multiple issues surrounding her own poor health and the deteriorating health and abilities of her husband.  She continues to have intermittent hematuria of varying degrees. She is not receiving either ASA or anticoagulants. She has not had intestinal bleeding. During all this times she has not had episodes of stroke or other embolic events, despite the fact that she is not receiving any stroke prevention medication. She had bladder surgery December 3 for recurrent cancer and a TURBT procedure in April for persistent gross hematuria. Since then she has much less urinary bleeding, but it has never really stopped.  Her [name] dual chamber pacemaker has 4-6 years of remaining longevity and lead parameters are good.  She has has recurrent and persistent atrial flutter and atrial fibrillation. During flutter, rate control is much harder. She remains mostly unaware of the arrhythmia. PM check shows 25% atrial mode switch , 68% atrial pacing, 22% ventricular pacing. V pacing has increased substantially, not clear  why. During atrial arrhythmia, ventricular rate control is far from ideal, but generally rates stay under 120.  Her ECG shows some unusual findings. The tracing starts with V paced rhythm with retrograde P waves that fall in PVARP, at a rate of about 65 bpm. The V pacing ends when there is a failure to conduct retrogradely, followed by a native P wave with normal AV conduction and sinus rhythm. Unclear if this was due to mode switch, hard to explain even if she was in DDIR mode. Not lear if this type of scenario explains the increase in V pacing.    Past Medical History  Diagnosis Date  . Hyperlipidemia   . Diverticulosis   . Colostomy in place [facility])   . PONV (postoperative nausea and vomiting)   . Chronic anticoagulation   . Fluid collection (edema) in the arms, legs, hands and feet   . Urinary frequency     with burning x 2 days  . Hypertension   . RBBB   . PAT (paroxysmal atrial tachycardia) ([place])   . Coronary artery disease     DR. Seven [name] IS PT'S CARDIOLOGIST  . Anxiety   . Iron deficiency anemia   . Gross hematuria   . Cardiac pacemaker in [place]-- PLACEMENT 09-10-2008  . Paroxysmal atrial flutter (HCC)     CARDIOLOGIST--  DR Victoria [name]  . SSS (sick sinus syndrome) (HCC)     S/P PACEMAKER  . Bladder cancer [facility]) recurrent bladder tumor     secondarty to ovarian cancer--  palliative radiation 09-13-2013 to  10-22-2013  . Malignant neoplasm of ovary ([place]) ONOCOLOGIST--  DR Victoria [name]  03-13-2012  w/ rectal mass    dx  primay ovarian cancer , +cytokeratin 7, estrogen receptor and WT-1--  chemotherapy and s/p omentectomy and bilateral oophorectomy03-05-2013    Past Surgical History  Procedure Laterality Date  . Permanent pacemaker insertion  09-10-2008    St.Jude  . Rib removed  1934    Pleurisy and pneumonia  . Bladder neck suspension  1986  . Colon resection  04/04/2012    Procedure: COLON RESECTION;  Surgeon: Victoria Bookbinder, MD;  Location: [facility];  Service:  General;  Laterality: N/A;  laparotomy with small bowel resection for obstruction with colostomy with gastrostomy tube placement.   . Portacath placement  05/04/2012    Procedure: INSERTION PORT-A-CATH;  Surgeon: Victoria Bookbinder, MD;  Location: [facility];  Service: General;  Laterality: Right;  . Laparotomy Bilateral 10/03/2012    Procedure: EXPLORATORY LAPAROTOMY;  Surgeon: Victoria Gurney A. Alycia Rossetti, MD;  Location: [facility];  Service: Gynecology;  Laterality: Bilateral;  WITH BSO, TUMOR DEBULKING  . Colostomy revision N/A 10/03/2012    Procedure: COLOSTOMY REVISION;  Surgeon: Victoria Bookbinder, MD;  Location: [facility];  Service: General;  Laterality: N/A;  COLOSTOMY REVISION, Sigmoid colectomy  . Port-a-cath removal N/A 02/21/2013    Procedure: REMOVAL PORT-A-CATH;  Surgeon: Victoria Bookbinder, MD;  Location: [facility];  Service: General;  Laterality: N/A;  . Transurethral resection of bladder tumor with gyrus (turbt-gyrus) N/A 09/05/2013    Procedure: TRANSURETHRAL RESECTION OF BLADDER TUMOR WITH GYRUS (TURBT-GYRUS);  Surgeon: Victoria Bonine, MD;  Location: [facility];  Service: Urology;  Laterality: N/A;  . Transurethral resection of bladder tumor N/A 06/27/2014    Procedure: TRANSURETHRAL RESECTION OF BLADDER TUMOR (TURBT);  Surgeon: Victoria Loa, MD;  Location: [facility];  Service: Urology;  Laterality: N/A;  . Cystoscopy N/A 06/27/2014    Procedure: CYSTOSCOPY;  Surgeon: Victoria Loa, MD;  Location: [facility];  Service: Urology;  Laterality: N/A;  . Abdominal hysterectomy  1972  . Cardiac electrophysiology mapping and ablation  05-21-2008   dr Victoria [name]    a-flutter  . Electrophysiology study  09-26-2008  dr Victoria [name]  03-27-2012  dr Victoria [name]    cardiac ,  no ablation in 2010/   2013  overdrive pacing atrial flutter  . Transthoracic echocardiogram  07-17-2008      ef >55%,  mild to moderate MR and TR,  mild  sclerotic AV without stenosis,    . Cardiovascular stress test  07-17-2008  dr Victoria [name]    normal  myocardial perfusion w/ attenuation artifact in the anterior region of mycardium;  no ischemia or infarct/scar/  low risk scan/  normal LV function and wall motion , ef  83%  . Transurethral resection of bladder tumor N/A 11/18/2014    Procedure: TRANSURETHRAL RESECTION OF BLADDER TUMOR (TURBT);  Surgeon: Franchot Gallo, MD;  Location: [facility];  Service: Urology;  Laterality: N/A;  . Cystoscopy N/A 11/18/2014    Procedure: CYSTOSCOPY;  Surgeon: Franchot Gallo, MD;  Location: [facility];  Service: Urology;  Laterality: N/A;     Current Outpatient Prescriptions  Medication Sig Dispense Refill  . diltiazem (CARDIZEM CD) 360 MG 24 hr capsule Take 1 capsule (360 mg total) by mouth daily. (Patient taking differently: Take 360 mg by mouth every morning. ) 90 capsule 3  . furosemide (LASIX) 20 MG tablet Take 1 tablet (20 mg total) by mouth daily as needed. [place]  tablet 3  . iron polysaccharides (NIFEREX) 150 MG capsule Take 1 capsule (150 mg total) by mouth daily. (Patient taking differently: Take 150 mg by mouth every evening. ) 30 capsule 6  . lactose free nutrition (BOOST) LIQD Take 237 mLs by mouth 2 (two) times a week.     . potassium chloride (K-DUR) 10 MEQ tablet Take 1 tablet (10 mEq total) by mouth daily. 90 tablet 3   No current facility-administered medications for this visit.   Facility-Administered Medications Ordered in Other Visits  Medication Dose Route Frequency Provider Last Rate Last Dose  . heparin 6,000 Units in sodium chloride irrigation 0.9 % 500 mL irrigation   Irrigation Once Victoria Bookbinder, MD      . sodium chloride 0.9 % injection 10 mL  10 mL Intracatheter PRN Ladell Pier, MD   10 mL at 07/18/12 1248    Allergies:   Pindolol; Iodine; Shellfish allergy; Vibramycin; Doxycycline; Levofloxacin; Multaq; and Vicodin    Social History:  The patient  reports that she has never smoked. She has never used smokeless tobacco. She reports  that she does not drink alcohol or use illicit drugs.   Family History:  The patient's family history includes Heart disease in her mother.    ROS:  Please see the history of present illness.    Otherwise, review of systems positive for none.   All other systems are reviewed and negative.    PHYSICAL EXAM: VS:  BP 94/64 mmHg  Pulse 67  Ht 5' 0.5" (1.537 m)  Wt 138 lb 8 oz (62.823 kg)  BMI 26.59 kg/m2 , BMI Body mass index is 26.59 kg/(m^2).  General: Alert, oriented x3, no distress Head: no evidence of trauma, PERRL, EOMI, no exophtalmos or lid lag, no myxedema, no xanthelasma; normal ears, nose and oropharynx Neck: normal jugular venous pulsations and no hepatojugular reflux; brisk carotid pulses without delay and no carotid bruits Chest: clear to auscultation, no signs of consolidation by percussion or palpation, normal fremitus, symmetrical and full respiratory excursions Cardiovascular: normal position and quality of the apical impulse, regular rhythm, normal first and second heart sounds, no murmurs, rubs or gallops Abdomen: no tenderness or distention, no masses by palpation, no abnormal pulsatility or arterial bruits, normal bowel sounds, no hepatosplenomegaly, RLQ colostomy Extremities: no clubbing, cyanosis or edema; 2+ radial, ulnar and brachial pulses bilaterally; 2+ right femoral, posterior tibial and dorsalis pedis pulses; 2+ left femoral, posterior tibial and dorsalis pedis pulses; no subclavian or femoral bruits Neurological: grossly nonfocal Psych: euthymic mood, full affect   EKG:  EKG is ordered today. The ekg ordered today demonstrates : see above   Recent Labs: 04/24/2015: HGB 13.6; Platelets 186 06/26/2015: BUN 12; Creat 1.02*; Potassium 3.9; Sodium 135    Lipid Panel    Component Value Date/Time   CHOL 115 04/10/2012 0520   TRIG 80 04/10/2012 0520   HDL 43 05/15/2008 0620   CHOLHDL 3.3 05/15/2008 0620   VLDL 17 05/15/2008 0620   LDLCALC  05/15/2008  0620    82        Total Cholesterol/HDL:CHD Risk Coronary Heart Disease Risk Table                     Men   Women  1/2 Average Risk   3.4   3.3      Wt Readings from Last 3 Encounters:  07/01/15 138 lb 8 oz (62.823 kg)  04/24/15 140 lb 3.2 oz (63.594 kg)  12/31/14 143 lb 1.6 oz (64.91 kg)     ASSESSMENT AND PLAN:  1. Recurrent 1. Persistent atrial flutter and atrial fibrillation. She has not been able to take either aspirin or anticoagulate due to recurrent urinary bleeding. No change in medications. The risk of stroke remains high (CHADSVasc2 score is 4), but she has no interest in starting anticoagulants at this time.  2. Pacemaker - she is not pacemaker dependent. Her device is not amenable to remote monitoring. Will ask for some opinions on th current V pacing event to see if we can prevent unnecessary V pacing in the future. So far, no negative hemodynamic impact    Current medicines are reviewed at length with the patient today.  The patient does not have concerns regarding medicines.  The following changes have been made:  no change  Labs/ tests ordered today include:   Orders Placed This Encounter  Procedures  . EKG 12-Lead    Patient Instructions  Your physician has recommended you make the following change in your medication: ONLY TAKE YOUR FUROSEMIDE AS NEEDED FOR SWELLING   Dr. Sallyanne Kuster recommends that you schedule a follow-up appointment in: [place] ([place]).     Mikael Spray, MD  07/03/2015 10:48 AM    Victoria Klein, MD, [facility] [phone] office [phone] pager

## 2015-07-01 NOTE — Patient Instructions (Addendum)
Your physician has recommended you make the following change in your medication: ONLY TAKE YOUR FUROSEMIDE AS NEEDED FOR SWELLING   Dr. Sallyanne Kuster recommends that you schedule a follow-up appointment in: [place] ([place]).

## 2015-07-24 LAB — CUP PACEART INCLINIC DEVICE CHECK
Date Time Interrogation Session: 20161229105445
Implantable Lead Implant Date: 20100216
Implantable Lead Location: 753859
Lead Channel Setting Pacing Pulse Width: 0.4 ms
Lead Channel Setting Sensing Sensitivity: 1.5 mV
MDC IDC LEAD IMPLANT DT: 20100216
MDC IDC LEAD LOCATION: 753860
MDC IDC PG SERIAL: 2195755
MDC IDC SET LEADCHNL RA PACING AMPLITUDE: 2 V
MDC IDC SET LEADCHNL RV PACING AMPLITUDE: 2.5 V

## 2015-07-25 ENCOUNTER — Encounter: Payer: Self-pay | Admitting: Cardiovascular Disease

## 2015-09-16 ENCOUNTER — Other Ambulatory Visit: Payer: Self-pay | Admitting: Urology

## 2015-09-18 ENCOUNTER — Telehealth: Payer: Self-pay | Admitting: *Deleted

## 2015-09-18 NOTE — Telephone Encounter (Signed)
Perioperative prescription for implanted cardiac device programming - procedure: transurethral resection bladder tumor.  St Jude pacer not dependent with normal device function.  Last checked 07/01/2015 faxed to [facility].

## 2015-09-23 ENCOUNTER — Telehealth: Payer: Self-pay | Admitting: Oncology

## 2015-09-23 ENCOUNTER — Telehealth: Payer: Self-pay | Admitting: *Deleted

## 2015-09-23 NOTE — Telephone Encounter (Signed)
Called pt, she would like to be seen in office 3/1. Order sent to schedulers for appt.

## 2015-09-23 NOTE — Telephone Encounter (Signed)
perp of to sch pt appt-pt aware °

## 2015-09-23 NOTE — Telephone Encounter (Signed)
Call from pt reporting Dr. Diona Fanti is planning another resection of the bladder cancer on 3/6. She will stay overnight at [facility] after this procedure. She reports ongoing urinary infection symptoms since November. She recently completed a course of Cephalexin but still has dysuria.  Pt now has R back pain near her bra line. ("Where I had the rib removed as a child.") She wonders if the cancer has spread. Noticed this pain a couple weeks ago. Pt is requesting scans to evaluate the upper back pain. Next office visit 3/28 with APP.

## 2015-09-23 NOTE — Telephone Encounter (Signed)
We can see sooner to evaluate the back pain Can she come in 3/1?

## 2015-09-24 ENCOUNTER — Telehealth: Payer: Self-pay | Admitting: Oncology

## 2015-09-24 ENCOUNTER — Ambulatory Visit (HOSPITAL_BASED_OUTPATIENT_CLINIC_OR_DEPARTMENT_OTHER): Payer: Medicare Other | Admitting: Oncology

## 2015-09-24 VITALS — BP 141/84 | HR 102 | Temp 98.4°F | Resp 17 | Ht 60.5 in | Wt 136.3 lb

## 2015-09-24 DIAGNOSIS — C7911 Secondary malignant neoplasm of bladder: Secondary | ICD-10-CM | POA: Diagnosis not present

## 2015-09-24 DIAGNOSIS — C784 Secondary malignant neoplasm of small intestine: Secondary | ICD-10-CM

## 2015-09-24 DIAGNOSIS — R0789 Other chest pain: Secondary | ICD-10-CM

## 2015-09-24 DIAGNOSIS — C561 Malignant neoplasm of right ovary: Secondary | ICD-10-CM | POA: Diagnosis not present

## 2015-09-24 DIAGNOSIS — R309 Painful micturition, unspecified: Secondary | ICD-10-CM

## 2015-09-24 DIAGNOSIS — C569 Malignant neoplasm of unspecified ovary: Secondary | ICD-10-CM

## 2015-09-24 NOTE — Progress Notes (Signed)
[facility] OFFICE PROGRESS NOTE   Diagnosis: Ovarian cancer  INTERVAL HISTORY:   Victoria [name] returns prior to a scheduled visit. She reports increased gross hematuria with clots last month. She has persistent burning with urination. Antibiotics did not help her symptoms. She is scheduled for cystoscopic debulking of a recurrent bladder tumor by Dr. Diona Fanti on 09/29/2015.  She reports a 2 to three-week history of "soreness "at the right posterior chest wall near a surgical scar. She describes a constant discomfort and tenderness surrounding the scar.  Objective:  Vital signs in last 24 hours:  Blood pressure 141/84, pulse 102, temperature 98.4 F (36.9 C), temperature source Oral, resp. rate 17, height 5' 0.5" (1.537 m), weight 136 lb 4.8 oz (61.825 kg), SpO2 99 %.    HEENT: Neck without mass Lymphatics: No cervical, supraclavicular, axillary, or inguinal nodes Resp: Decreased breath sounds at the right lower chest, no respiratory distress Cardio: Regular rate and rhythm GI: No hepatomegaly, left lower quadrant colostomy with a parastomal hernia, no mass Vascular: No leg edema Musculoskeletal: There is tenderness over the chest wall immediately over and superior to the right posterior chest wall scar. No mass or rash.     Lab Results:  Lab Results  Component Value Date   WBC 5.7 04/24/2015   HGB 13.6 04/24/2015   HCT 40.4 04/24/2015   MCV 94.2 04/24/2015   PLT 186 04/24/2015   NEUTROABS 4.1 04/24/2015     Medications: I have reviewed the patient's current medications.  Assessment/Plan: 1. Ovarian cancer-presenting with a rectal mass , status post sigmoidoscopy 03/13/2012 with findings of a 5 cm malignant appearing mass with central ulceration in the rectum 2-3 cm proximal to the anal verge. Pathology showed moderate to poorly differentiated adenocarcinoma with ulceration and prolapse changes. By immunohistochemistry the malignant cells were positive for  cytokeratin 7, estrogen receptor and WT-1. They were negative for cytokeratin 20 and CDX-2. This immunohistochemical profile was strongly suggestive of a gynecologic primary. -Status post 3 cycles of Taxol/carboplatin chemotherapy with normalization of the CA 125, restaging CT 08/07/2012 with marked improvement in the pelvic masses and no evidence of progressive ovarian cancer. She completed cycle 4 of Taxol/carboplatin beginning 08/08/2012. 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 She completed cycle 6 Taxol/carboplatin beginning 11/21/2012. 2. History of Atrial fibrillation  3. Status post Port-A-Cath placement 05/04/2012. Port-A-Cath removal 02/21/2013 4. 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 5. Admission 09/02/2013 with gross hematuria-right pelvic mass with involvement of the bladder confirmed on CT with a cystoscopy confirming a large tumor in the bladder with associated bleeding, status post TURBT and fulguration, pathology consistent with a high-grade carcinoma. Immunohistochemical stains returned positive for cytokeratin 7 and WT-1, and negative for cytokeratin 20 and cytokeratin 903. She completed a course of palliative radiation 09/13/2013 through 10/22/2013.  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 History of anemia secondary to bleeding. She continues iron.  7. Right posterior chest wall pain and tenderness-etiology unclear   Disposition:  [name] has persistent hematuria secondary to tumor involving the bladder. She is scheduled for transurethral resection next week.  The etiology of  the chest wall pain and tenderness is unclear. She  could have a rib fracture, or a metastasis to the chest wall. She will be referred for a staging CT scan of the chest.  [name] will return for an office visit as scheduled 10/21/2015. We will see her sooner as needed.    [name], MD  09/24/2015  12:13 PM

## 2015-09-24 NOTE — Patient Instructions (Signed)
Victoria [name]  09/24/2015   Your procedure is scheduled on: 09/29/2015    Report to [facility]  Entrance take [place]  elevators to 3rd floor to  [place] at    [place] AM.  Call this number if you have problems the morning of surgery [phone]   Remember: ONLY 1 PERSON MAY GO WITH YOU TO SHORT STAY TO GET  READY MORNING OF [place].  Do not eat food or drink liquids :After Midnight.     Take these medicines the morning of surgery with A SIP OF WATER: Cardiazem                                 You may not have any metal on your body including hair pins and              piercings  Do not wear jewelry, make-up, lotions, powders or perfumes, deodorant             Do not wear nail polish.  Do not shave  48 hours prior to surgery.             Do not bring valuables to the hospital. [place].  Contacts, dentures or bridgework may not be worn into surgery.  Leave suitcase in the car. After surgery it may be brought to your room.         Special Instructions: coughing and deep breathing exercises, leg exercises               Please read over the following fact sheets you were given: _____________________________________________________________________             [facility] - Preparing for Surgery Before surgery, you can play an important role.  Because skin is not sterile, your skin needs to be as free of germs as possible.  You can reduce the number of germs on your skin by washing with CHG (chlorahexidine gluconate) soap before surgery.  CHG is an antiseptic cleaner which kills germs and bonds with the skin to continue killing germs even after washing. Please DO NOT use if you have an allergy to CHG or antibacterial soaps.  If your skin becomes reddened/irritated stop using the CHG and inform your nurse when you arrive at Short Stay. Do not shave (including legs and underarms) for at least 48  hours prior to the first CHG shower.  You may shave your face/neck. Please follow these instructions carefully:  1.  Shower with CHG Soap the night before surgery and the  morning of Surgery.  2.  If you choose to wash your hair, wash your hair first as usual with your  normal  shampoo.  3.  After you shampoo, rinse your hair and body thoroughly to remove the  shampoo.                           4.  Use CHG as you would any other liquid soap.  You can apply chg directly  to the skin and wash                       Gently with a  scrungie or clean washcloth.  5.  Apply the CHG Soap to your body ONLY FROM THE NECK DOWN.   Do not use on face/ open                           Wound or open sores. Avoid contact with eyes, ears mouth and genitals (private parts).                       Wash face,  Genitals (private parts) with your normal soap.             6.  Wash thoroughly, paying special attention to the area where your surgery  will be performed.  7.  Thoroughly rinse your body with warm water from the neck down.  8.  DO NOT shower/wash with your normal soap after using and rinsing off  the CHG Soap.                9.  Pat yourself dry with a clean towel.            10.  Wear clean pajamas.            11.  Place clean sheets on your bed the night of your first shower and do not  sleep with pets. Day of Surgery : Do not apply any lotions/deodorants the morning of surgery.  Please wear clean clothes to the hospital/surgery center.  FAILURE TO FOLLOW THESE INSTRUCTIONS MAY RESULT IN THE CANCELLATION OF YOUR SURGERY PATIENT SIGNATURE_________________________________  NURSE SIGNATURE__________________________________  ________________________________________________________________________

## 2015-09-24 NOTE — Telephone Encounter (Signed)
appt made and avs printed °

## 2015-09-25 ENCOUNTER — Telehealth: Payer: Self-pay | Admitting: *Deleted

## 2015-09-25 ENCOUNTER — Encounter (HOSPITAL_COMMUNITY)
Admission: RE | Admit: 2015-09-25 | Discharge: 2015-09-25 | Disposition: A | Discharge status: Home / Self Care | Payer: Medicare Other | Source: Ambulatory Visit | Attending: Urology | Admitting: Urology

## 2015-09-25 ENCOUNTER — Ambulatory Visit (HOSPITAL_COMMUNITY)
Admission: RE | Admit: 2015-09-25 | Discharge: 2015-09-25 | Disposition: A | Discharge status: Home / Self Care | Payer: Medicare Other | Source: Ambulatory Visit | Attending: Oncology | Admitting: Oncology

## 2015-09-25 ENCOUNTER — Encounter (HOSPITAL_COMMUNITY): Payer: Self-pay

## 2015-09-25 DIAGNOSIS — I2510 Atherosclerotic heart disease of native coronary artery without angina pectoris: Secondary | ICD-10-CM | POA: Insufficient documentation

## 2015-09-25 DIAGNOSIS — N2889 Other specified disorders of kidney and ureter: Secondary | ICD-10-CM | POA: Insufficient documentation

## 2015-09-25 DIAGNOSIS — R0789 Other chest pain: Secondary | ICD-10-CM | POA: Insufficient documentation

## 2015-09-25 DIAGNOSIS — J479 Bronchiectasis, uncomplicated: Secondary | ICD-10-CM | POA: Insufficient documentation

## 2015-09-25 DIAGNOSIS — Z8543 Personal history of malignant neoplasm of ovary: Secondary | ICD-10-CM | POA: Diagnosis present

## 2015-09-25 DIAGNOSIS — I517 Cardiomegaly: Secondary | ICD-10-CM | POA: Insufficient documentation

## 2015-09-25 DIAGNOSIS — C569 Malignant neoplasm of unspecified ovary: Secondary | ICD-10-CM

## 2015-09-25 DIAGNOSIS — J984 Other disorders of lung: Secondary | ICD-10-CM | POA: Diagnosis not present

## 2015-09-25 LAB — BASIC METABOLIC PANEL
Anion gap: 11 (ref 5–15)
BUN: 18 mg/dL (ref 6–20)
CALCIUM: 9.1 mg/dL (ref 8.9–10.3)
CHLORIDE: 103 mmol/L (ref 101–111)
CO2: 25 mmol/L (ref 22–32)
CREATININE: 0.94 mg/dL (ref 0.44–1.00)
GFR, EST NON AFRICAN AMERICAN: 53 mL/min — AB (ref >60)
Glucose, Bld: 111 mg/dL — ABNORMAL HIGH (ref 65–99)
Potassium: 4.3 mmol/L (ref 3.5–5.1)
SODIUM: 139 mmol/L (ref 135–145)

## 2015-09-25 LAB — CBC
HCT: 42.8 % (ref 36.0–46.0)
HEMOGLOBIN: 13.9 g/dL (ref 12.0–15.0)
MCH: 31.2 pg (ref 26.0–34.0)
MCHC: 32.5 g/dL (ref 30.0–36.0)
MCV: 96.2 fL (ref 78.0–100.0)
PLATELETS: 223 10*3/uL (ref 150–400)
RBC: 4.45 MIL/uL (ref 3.87–5.11)
RDW: 14.4 % (ref 11.5–15.5)
WBC: 7.9 10*3/uL (ref 4.0–10.5)

## 2015-09-25 NOTE — Progress Notes (Signed)
CT of Chest done 09/25/2015- [facility].

## 2015-09-25 NOTE — Telephone Encounter (Signed)
-----   Message from Ladell Pier, MD sent at 09/25/2015  4:34 PM EST ----- Please call patient, ct shows stable scarring rt. Lower lung, no explanation for pain, call for increased pain, f/u as scheduled

## 2015-09-25 NOTE — Telephone Encounter (Signed)
Called pt with CT result. Stable scarring R lower lung. No explanation for pain, call for increased pain. Follow up as scheduled. Pt voiced understanding.

## 2015-09-25 NOTE — Progress Notes (Signed)
07/01/15- EKG-EPIC 07/01/15- last Device Check in [facility]  06/23/15- [place] 07/01/15- LOV- Cardiology-EPIC  09/24/15- LOV- Dr Benay Spice- EPIC

## 2015-09-25 NOTE — Progress Notes (Signed)
Patient is aware to bring colostomy supplies from home.

## 2015-09-29 ENCOUNTER — Ambulatory Visit (HOSPITAL_COMMUNITY): Payer: Medicare Other | Admitting: Anesthesiology

## 2015-09-29 ENCOUNTER — Observation Stay (HOSPITAL_COMMUNITY)
Admission: RE | Admit: 2015-09-29 | Discharge: 2015-09-30 | Disposition: A | Discharge status: Home / Self Care | Payer: Medicare Other | Source: Ambulatory Visit | Attending: Urology | Admitting: Urology

## 2015-09-29 ENCOUNTER — Encounter (HOSPITAL_COMMUNITY): Payer: Self-pay | Admitting: *Deleted

## 2015-09-29 ENCOUNTER — Encounter (HOSPITAL_COMMUNITY)
Admission: RE | Disposition: A | Discharge status: Home / Self Care | Payer: Self-pay | Source: Ambulatory Visit | Attending: Urology

## 2015-09-29 DIAGNOSIS — Z8543 Personal history of malignant neoplasm of ovary: Secondary | ICD-10-CM | POA: Insufficient documentation

## 2015-09-29 DIAGNOSIS — Z79899 Other long term (current) drug therapy: Secondary | ICD-10-CM | POA: Diagnosis not present

## 2015-09-29 DIAGNOSIS — C671 Malignant neoplasm of dome of bladder: Secondary | ICD-10-CM

## 2015-09-29 DIAGNOSIS — C7911 Secondary malignant neoplasm of bladder: Secondary | ICD-10-CM | POA: Diagnosis not present

## 2015-09-29 DIAGNOSIS — Z8551 Personal history of malignant neoplasm of bladder: Secondary | ICD-10-CM | POA: Diagnosis not present

## 2015-09-29 DIAGNOSIS — Z9221 Personal history of antineoplastic chemotherapy: Secondary | ICD-10-CM | POA: Diagnosis not present

## 2015-09-29 DIAGNOSIS — Z950 Presence of cardiac pacemaker: Secondary | ICD-10-CM | POA: Diagnosis not present

## 2015-09-29 DIAGNOSIS — I10 Essential (primary) hypertension: Secondary | ICD-10-CM | POA: Insufficient documentation

## 2015-09-29 DIAGNOSIS — I495 Sick sinus syndrome: Secondary | ICD-10-CM | POA: Insufficient documentation

## 2015-09-29 DIAGNOSIS — I4892 Unspecified atrial flutter: Secondary | ICD-10-CM | POA: Diagnosis not present

## 2015-09-29 DIAGNOSIS — I4510 Unspecified right bundle-branch block: Secondary | ICD-10-CM | POA: Diagnosis not present

## 2015-09-29 DIAGNOSIS — R319 Hematuria, unspecified: Secondary | ICD-10-CM | POA: Diagnosis present

## 2015-09-29 DIAGNOSIS — I2510 Atherosclerotic heart disease of native coronary artery without angina pectoris: Secondary | ICD-10-CM | POA: Diagnosis not present

## 2015-09-29 DIAGNOSIS — Z933 Colostomy status: Secondary | ICD-10-CM | POA: Diagnosis not present

## 2015-09-29 DIAGNOSIS — I480 Paroxysmal atrial fibrillation: Secondary | ICD-10-CM | POA: Insufficient documentation

## 2015-09-29 HISTORY — DX: Nausea: R11.0

## 2015-09-29 HISTORY — PX: TRANSURETHRAL RESECTION OF BLADDER TUMOR WITH GYRUS (TURBT-GYRUS): SHX6458

## 2015-09-29 HISTORY — DX: Malignant neoplasm of dome of bladder: C67.1

## 2015-09-29 SURGERY — TRANSURETHRAL RESECTION OF BLADDER TUMOR WITH GYRUS (TURBT-GYRUS)
Anesthesia: General

## 2015-09-29 MED ORDER — DILTIAZEM HCL ER COATED BEADS 180 MG PO CP24
360.0000 mg | ORAL_CAPSULE | Freq: Every day | ORAL | Status: DC
  Administered 2015-09-30: 360 mg via ORAL
  Filled 2015-09-29: qty 2

## 2015-09-29 MED ORDER — MEPERIDINE HCL 50 MG/ML IJ SOLN: 6.2500 mg | INTRAMUSCULAR | Status: DC | PRN

## 2015-09-29 MED ORDER — MORPHINE SULFATE (PF) 2 MG/ML IV SOLN: 2.0000 mg | INTRAVENOUS | Status: DC | PRN

## 2015-09-29 MED ORDER — BOOST PO LIQD
237.0000 mL | ORAL | Status: DC
  Administered 2015-09-29: 237 mL via ORAL
  Filled 2015-09-29: qty 237

## 2015-09-29 MED ORDER — ONDANSETRON HCL 4 MG/2ML IJ SOLN
INTRAMUSCULAR | Status: DC | PRN
  Administered 2015-09-29: 4 mg via INTRAVENOUS

## 2015-09-29 MED ORDER — CEPHALEXIN 500 MG PO CAPS
500.0000 mg | ORAL_CAPSULE | Freq: Two times a day (BID) | ORAL | Status: DC
  Administered 2015-09-29 – 2015-09-30 (×3): 500 mg via ORAL
  Filled 2015-09-29 (×3): qty 1

## 2015-09-29 MED ORDER — ONDANSETRON HCL 4 MG/2ML IJ SOLN
INTRAMUSCULAR | Status: AC
  Filled 2015-09-29: qty 2

## 2015-09-29 MED ORDER — POTASSIUM CHLORIDE ER 10 MEQ PO TBCR
10.0000 meq | EXTENDED_RELEASE_TABLET | Freq: Every day | ORAL | Status: DC | PRN
  Filled 2015-09-29: qty 1

## 2015-09-29 MED ORDER — PROPOFOL 10 MG/ML IV BOLUS
INTRAVENOUS | Status: DC | PRN
  Administered 2015-09-29: 100 mg via INTRAVENOUS

## 2015-09-29 MED ORDER — DOCUSATE SODIUM 100 MG PO CAPS
100.0000 mg | ORAL_CAPSULE | Freq: Two times a day (BID) | ORAL | Status: DC
  Administered 2015-09-29 – 2015-09-30 (×3): 100 mg via ORAL
  Filled 2015-09-29 (×3): qty 1

## 2015-09-29 MED ORDER — PHENYLEPHRINE HCL 10 MG/ML IJ SOLN
INTRAMUSCULAR | Status: DC | PRN
  Administered 2015-09-29 (×6): 40 ug via INTRAVENOUS

## 2015-09-29 MED ORDER — LIDOCAINE HCL (CARDIAC) 20 MG/ML IV SOLN
INTRAVENOUS | Status: DC | PRN
  Administered 2015-09-29: 50 mg via INTRAVENOUS

## 2015-09-29 MED ORDER — ARTIFICIAL TEARS OP OINT
TOPICAL_OINTMENT | OPHTHALMIC | Status: AC
  Filled 2015-09-29: qty 3.5

## 2015-09-29 MED ORDER — DILTIAZEM HCL ER COATED BEADS 180 MG PO CP24: 360.0000 mg | ORAL_CAPSULE | Freq: Every day | ORAL | Status: DC

## 2015-09-29 MED ORDER — FENTANYL CITRATE (PF) 100 MCG/2ML IJ SOLN: 25.0000 ug | INTRAMUSCULAR | Status: DC | PRN

## 2015-09-29 MED ORDER — ACETAMINOPHEN 325 MG PO TABS: 650.0000 mg | ORAL_TABLET | ORAL | Status: DC | PRN

## 2015-09-29 MED ORDER — ONDANSETRON HCL 4 MG/2ML IJ SOLN: 4.0000 mg | INTRAMUSCULAR | Status: DC | PRN

## 2015-09-29 MED ORDER — POLYSACCHARIDE IRON COMPLEX 150 MG PO CAPS
150.0000 mg | ORAL_CAPSULE | Freq: Every evening | ORAL | Status: DC
  Administered 2015-09-29: 150 mg via ORAL
  Filled 2015-09-29: qty 1

## 2015-09-29 MED ORDER — STERILE WATER FOR IRRIGATION IR SOLN
Status: DC | PRN
  Administered 2015-09-29: 30 mL

## 2015-09-29 MED ORDER — SODIUM CHLORIDE 0.45 % IV SOLN
INTRAVENOUS | Status: DC
  Administered 2015-09-29 – 2015-09-30 (×2): via INTRAVENOUS

## 2015-09-29 MED ORDER — FUROSEMIDE 20 MG PO TABS: 20.0000 mg | ORAL_TABLET | Freq: Every day | ORAL | Status: DC | PRN

## 2015-09-29 MED ORDER — FENTANYL CITRATE (PF) 100 MCG/2ML IJ SOLN
INTRAMUSCULAR | Status: DC | PRN
  Administered 2015-09-29 (×2): 25 ug via INTRAVENOUS

## 2015-09-29 MED ORDER — LIDOCAINE HCL (CARDIAC) 20 MG/ML IV SOLN
INTRAVENOUS | Status: AC
  Filled 2015-09-29: qty 5

## 2015-09-29 MED ORDER — PROPOFOL 10 MG/ML IV BOLUS
INTRAVENOUS | Status: AC
  Filled 2015-09-29: qty 20

## 2015-09-29 MED ORDER — CEFAZOLIN SODIUM-DEXTROSE 2-3 GM-% IV SOLR
INTRAVENOUS | Status: AC
  Filled 2015-09-29: qty 50

## 2015-09-29 MED ORDER — PHENYLEPHRINE 40 MCG/ML (10ML) SYRINGE FOR IV PUSH (FOR BLOOD PRESSURE SUPPORT)
PREFILLED_SYRINGE | INTRAVENOUS | Status: AC
  Filled 2015-09-29: qty 10

## 2015-09-29 MED ORDER — FENTANYL CITRATE (PF) 100 MCG/2ML IJ SOLN
INTRAMUSCULAR | Status: AC
  Filled 2015-09-29: qty 2

## 2015-09-29 MED ORDER — CEFAZOLIN SODIUM-DEXTROSE 2-3 GM-% IV SOLR
2.0000 g | INTRAVENOUS | Status: AC
  Administered 2015-09-29: 2 g via INTRAVENOUS

## 2015-09-29 MED ORDER — LACTATED RINGERS IV SOLN
INTRAVENOUS | Status: DC | PRN
  Administered 2015-09-29: 07:00:00 via INTRAVENOUS

## 2015-09-29 MED ORDER — SODIUM CHLORIDE 0.9 % IR SOLN
1000.0000 mL | Status: DC
  Administered 2015-09-29: 1000 mL

## 2015-09-29 MED ORDER — SODIUM CHLORIDE 0.9 % IR SOLN
Status: DC | PRN
  Administered 2015-09-29: 14000 mL

## 2015-09-29 MED ORDER — OXYBUTYNIN CHLORIDE 5 MG PO TABS: 5.0000 mg | ORAL_TABLET | Freq: Three times a day (TID) | ORAL | Status: DC | PRN

## 2015-09-29 MED ORDER — ARTIFICIAL TEARS OP OINT
TOPICAL_OINTMENT | Freq: Two times a day (BID) | OPHTHALMIC | Status: DC | PRN
  Filled 2015-09-29: qty 3.5

## 2015-09-29 SURGICAL SUPPLY — 20 items
BAG URINE DRAINAGE (UROLOGICAL SUPPLIES) ×2 IMPLANT
BAG URO CATCHER STRL LF (MISCELLANEOUS) ×3 IMPLANT
CATH FOLEY 3WAY 30CC 22FR (CATHETERS) ×2 IMPLANT
ELECT REM PT RETURN 9FT ADLT (ELECTROSURGICAL) ×3
ELECTRODE REM PT RTRN 9FT ADLT (ELECTROSURGICAL) ×1 IMPLANT
EVACUATOR MICROVAS BLADDER (UROLOGICAL SUPPLIES) IMPLANT
GLOVE BIOGEL M 8.0 STRL (GLOVE) ×3 IMPLANT
GOWN STRL REUS W/ TWL XL LVL3 (GOWN DISPOSABLE) ×1 IMPLANT
GOWN STRL REUS W/TWL XL LVL3 (GOWN DISPOSABLE) ×6 IMPLANT
KIT ASPIRATION TUBING (SET/KITS/TRAYS/PACK) ×2 IMPLANT
LOOP CUT BIPOLAR 24F LRG (ELECTROSURGICAL) ×2 IMPLANT
MANIFOLD NEPTUNE II (INSTRUMENTS) ×3 IMPLANT
NDL SAFETY ECLIPSE 18X1.5 (NEEDLE) ×1 IMPLANT
NEEDLE HYPO 18GX1.5 SHARP (NEEDLE) ×3
PACK CYSTO (CUSTOM PROCEDURE TRAY) ×3 IMPLANT
SYR 30ML LL (SYRINGE) ×2 IMPLANT
SYRINGE IRR TOOMEY STRL 70CC (SYRINGE) ×2 IMPLANT
TUBING CONNECTING 10 (TUBING) ×2 IMPLANT
TUBING CONNECTING 10' (TUBING) ×1
WATER STERILE IRR 3000ML UROMA (IV SOLUTION) ×3 IMPLANT

## 2015-09-29 NOTE — Anesthesia Postprocedure Evaluation (Signed)
Anesthesia Post Note  Patient: Victoria [name]  Procedure(s) Performed: Procedure(s) (LRB): TRANSURETHRAL RESECTION OF BLADDER TUMOR WITH GYRUS (TURBT-GYRUS) (N/A)  Patient location during evaluation: PACU Anesthesia Type: General Level of consciousness: awake and alert Pain management: pain level controlled Vital Signs Assessment: post-procedure vital signs reviewed and stable Respiratory status: spontaneous breathing, nonlabored ventilation, respiratory function stable and patient connected to nasal cannula oxygen Cardiovascular status: blood pressure returned to baseline and stable Postop Assessment: no signs of nausea or vomiting Anesthetic complications: no    Last Vitals:  Filed Vitals:   09/29/15 1015 09/29/15 1035  BP: 120/67 125/60  Pulse: 59 59  Temp:  36.4 C  Resp: 13 16    Last Pain:  Filed Vitals:   09/29/15 1101  PainSc: 0-No pain                 Montez Hageman

## 2015-09-29 NOTE — Anesthesia Preprocedure Evaluation (Signed)
Anesthesia Evaluation  Patient identified by MRN, date of birth, ID band Patient awake    Reviewed: Allergy & Precautions, H&P , NPO status , Patient's Chart, lab work & pertinent test results  History of Anesthesia Complications (+) PONV  Airway Mallampati: II  TM Distance: >3 FB Neck ROM: Full    Dental no notable dental hx. (+) Teeth Intact, Dental Advisory Given   Pulmonary neg pulmonary ROS,    Pulmonary exam normal breath sounds clear to auscultation       Cardiovascular hypertension, Pt. on medications + CAD  + dysrhythmias Atrial Fibrillation + pacemaker  Rhythm:Regular Rate:Normal     Neuro/Psych negative neurological ROS  negative psych ROS   GI/Hepatic negative GI ROS, Neg liver ROS,   Endo/Other  negative endocrine ROS  Renal/GU negative Renal ROS  negative genitourinary   Musculoskeletal   Abdominal   Peds  Hematology negative hematology ROS (+)   Anesthesia Other Findings   Reproductive/Obstetrics negative OB ROS                             Anesthesia Physical  Anesthesia Plan  ASA: III  Anesthesia Plan: General   Post-op Pain Management:    Induction: Intravenous  Airway Management Planned: LMA  Additional Equipment:   Intra-op Plan:   Post-operative Plan: Extubation in OR  Informed Consent: I have reviewed the patients History and Physical, chart, labs and discussed the procedure including the risks, benefits and alternatives for the proposed anesthesia with the patient or authorized representative who has indicated his/her understanding and acceptance.   Dental advisory given  Plan Discussed with: CRNA  Anesthesia Plan Comments:         Anesthesia Quick Evaluation

## 2015-09-29 NOTE — Anesthesia Procedure Notes (Signed)
Procedure Name: LMA Insertion Performed by: Mechele Claude Pre-anesthesia Checklist: Patient identified, Emergency Drugs available, Suction available and Patient being monitored Patient Re-evaluated:Patient Re-evaluated prior to inductionOxygen Delivery Method: Circle System Utilized Preoxygenation: Pre-oxygenation with 100% oxygen Intubation Type: IV induction Ventilation: Mask ventilation without difficulty LMA: LMA inserted LMA Size: 4.0 Number of attempts: 1 Airway Equipment and Method: Bite block Placement Confirmation: positive ETCO2 Tube secured with: Tape Dental Injury: Teeth and Oropharynx as per pre-operative assessment

## 2015-09-29 NOTE — Transfer of Care (Addendum)
Last Vitals:  Filed Vitals:   09/29/15 0540  BP: 113/62  Pulse: 63  Temp: 36.4 C  Resp: 16    Immediate Anesthesia Transfer of Care Note  Patient: Victoria [name]  Procedure(s) Performed: Procedure(s) (LRB): TRANSURETHRAL RESECTION OF BLADDER TUMOR WITH GYRUS (TURBT-GYRUS) (N/A)  Patient Location: PACU  Anesthesia Type: General  Level of Consciousness: awake, alert  and oriented  Airway & Oxygen Therapy: Patient Spontanous Breathing and Patient connected to face mask oxygen  Post-op Assessment: Report given to PACU RN and Post -op Vital signs reviewed and stable  Post vital signs: Reviewed and stable  Complications: No apparent anesthesia complications

## 2015-09-29 NOTE — Op Note (Signed)
Preoperative diagnosis: Ovarian cancer with extension in the bladder  Postoperative diagnosis: Same  Principal procedure: TURBT of large bladder tumor  Surgeon: Barron Vanloan  Anesthesia: Gen. with LMA  Complications: None  Specimen: Bladder tumor fragments, to pathology  Drains: 22 French, three-way Foley catheter to CBI  Indications: 82 year old female with locally progressive ovarian cancer. This involves her bladder dome, and she is a significant bladder tumor representing this local extension, greater than 5 cm in size. She is symptomatic with urinary frequency and intermittent gross hematuria. She has had 2 prior palliative resections, the last approximate 1 year ago. She recently presented with worsening symptoms. She presents at this time for palliative resection.  Description of procedure: The patient was identified in the holding area. She was taken to the  OR where general anesthetic was administered with the LMA. Intravenous antibiotics were administered. She was placed in the dorsolithotomy position, genitalia and perineum were prepped and draped. Proper timeout was performed.  A 26 French resectoscope sheath was placed with the obturator. The resectoscope element and cutting loop/gyrus device were then placed. The bladder was inspected. The entire right dome/posterior aspect of the bladder was involved in a nodular and papillary growth of tissue. This was approximate 6-7 cm in size. Using the cutting loop, the entire tumor was resected down to a level base, corresponding with the normal bladder surface. The resection was started at the dome of the bladder posteriorly, and then worked down inferiorly towards the trigone of the bladder, which was not involved in the process. The lateral and medial aspect of the tumor were resected as well. There was some cystic changes inferiorly near the trigone. These were resected as well. Care was taken to avoid injury to the right ureteral orifice,  which was well away from the area of resection. Following aggressive, constant resection of her period of a proximally 1 hour and 15 minutes, the majority of the tumor was resected, at least the intravesical tumor. There was no evidence of perforation of the tumor. Following resection and removal of the resected chips by the [facility] syringe, the loop was then used to cauterize small bleeders, both the margin of resection and within the resected tumor. Careful inspection revealed no significant bleeding. At this point, the remaining chips were irrigated from the bladder. Inspection revealed no significant bleeding, and the scope was removed. A 22 French Foley catheter, three-way, was placed with the balloon filled with 30 mL of water. This was hooked to CBI with normal saline. Per the patient tolerated procedure well. She was awakened and taken to the PACU in stable condition.  The chips were then sent for pathology labeled "bladder tumor (ovarian cancer primary) "

## 2015-09-29 NOTE — H&P (Signed)
H&P  Chief Complaint: Blood in urine  History of Present Illness: Victoria [name] is a 82 y.o. year old female who presents for palliative TURBT of an ovarian-based tumor invading the bladder causing intermittent hematuria.She has locally progressive cancer treated by Dr Benay Spice. Her last palliative resection was in April 2016. She recently presented with recurrent hematuria. Office cystoscopy revealed a large dome-based recurrence.   Past Medical History  Diagnosis Date  . Hyperlipidemia   . Diverticulosis   . Colostomy in place [facility])   . PONV (postoperative nausea and vomiting)   . Chronic anticoagulation     patient states chronic anticoagulation stopped 2015   . Fluid collection (edema) in the arms, legs, hands and feet   . Urinary frequency     with burning x 2 days  . Hypertension   . RBBB   . PAT (paroxysmal atrial tachycardia) ([place])   . Iron deficiency anemia   . Gross hematuria   . Cardiac pacemaker in [place]-- PLACEMENT 09-10-2008  . Paroxysmal atrial flutter ([facility])     CARDIOLOGIST--  DR CROITORU  . SSS (sick sinus syndrome) (H[facility])     S/P PACEMAKER  . Bladder cancer [facility]) recurrent bladder tumor     secondarty to ovarian cancer--  palliative radiation 09-13-2013 to 10-22-2013  . Malignant neoplasm of ovary ([place]) ONOCOLOGIST--  DR Benay Spice  03-13-2012  w/ rectal mass    dx  primay ovarian cancer , +cytokeratin 7, estrogen receptor and WT-1--  chemotherapy and s/p omentectomy and bilateral oophorectomy03-05-2013  . Coronary artery disease     DR. CROITORU IS PT'S CARDIOLOGIST  . Presence of permanent cardiac pacemaker   . Urinary tract infection     Past Surgical History  Procedure Laterality Date  . Permanent pacemaker insertion  09-10-2008    [place]  . Rib removed  1934    Pleurisy and pneumonia  . Bladder neck suspension  1986  . Colon resection  04/04/2012    Procedure: COLON RESECTION;  Surgeon: Rolm Bookbinder, MD;  Location: [facility];   Service: General;  Laterality: N/A;  laparotomy with small bowel resection for obstruction with colostomy with gastrostomy tube placement.   . Portacath placement  05/04/2012    Procedure: INSERTION PORT-A-CATH;  Surgeon: Rolm Bookbinder, MD;  Location: [facility];  Service: General;  Laterality: Right;  . Laparotomy Bilateral 10/03/2012    Procedure: EXPLORATORY LAPAROTOMY;  Surgeon: Imagene Gurney A. Alycia Rossetti, MD;  Location: [facility];  Service: Gynecology;  Laterality: Bilateral;  WITH BSO, TUMOR DEBULKING  . Colostomy revision N/A 10/03/2012    Procedure: COLOSTOMY REVISION;  Surgeon: Rolm Bookbinder, MD;  Location: [facility];  Service: General;  Laterality: N/A;  COLOSTOMY REVISION, Sigmoid colectomy  . Port-a-cath removal N/A 02/21/2013    Procedure: REMOVAL PORT-A-CATH;  Surgeon: Rolm Bookbinder, MD;  Location: [facility];  Service: General;  Laterality: N/A;  . Transurethral resection of bladder tumor with gyrus (turbt-gyrus) N/A 09/05/2013    Procedure: TRANSURETHRAL RESECTION OF BLADDER TUMOR WITH GYRUS (TURBT-GYRUS);  Surgeon: Fredricka Bonine, MD;  Location: [facility];  Service: Urology;  Laterality: N/A;  . Transurethral resection of bladder tumor N/A 06/27/2014    Procedure: TRANSURETHRAL RESECTION OF BLADDER TUMOR (TURBT);  Surgeon: Jorja Loa, MD;  Location: [facility];  Service: Urology;  Laterality: N/A;  . Cystoscopy N/A 06/27/2014    Procedure: CYSTOSCOPY;  Surgeon: Jorja Loa, MD;  Location: [facility];  Service: Urology;  Laterality: N/A;  .  Abdominal hysterectomy  1972  . Cardiac electrophysiology mapping and ablation  05-21-2008   dr Caryl Comes    a-flutter  . Electrophysiology study  09-26-2008  dr Caryl Comes  03-27-2012  dr croitoru    cardiac ,  no ablation in 2010/   2013  overdrive pacing atrial flutter  . Transthoracic echocardiogram  07-17-2008      ef >55%,  mild to moderate MR and TR,  mild  sclerotic AV without stenosis,    . Cardiovascular stress test  07-17-2008  dr croitoru     normal myocardial perfusion w/ attenuation artifact in the anterior region of mycardium;  no ischemia or infarct/scar/  low risk scan/  normal LV function and wall motion , ef  83%  . Transurethral resection of bladder tumor N/A 11/18/2014    Procedure: TRANSURETHRAL RESECTION OF BLADDER TUMOR (TURBT);  Surgeon: Franchot Gallo, MD;  Location[facility];  Service: Urology;  Laterality: N/A;  . Cystoscopy N/A 11/18/2014    Procedure: CYSTOSCOPY;  Surgeon: Franchot Gallo, MD;  [facility];  Service: Urology;  Laterality: N/A;    Home Medications:  Medications Prior to Admission  Medication Sig Dispense Refill  . Carboxymethylcell-Hypromellose (GENTEAL OP) Apply 1 drop to eye 2 (two) times daily as needed (Dry eyes).    Marland Kitchen diltiazem (CARDIZEM CD) 360 MG 24 hr capsule Take 1 capsule (360 mg total) by mouth daily. (Patient taking differently: Take 360 mg by mouth every morning. ) 90 capsule 3  . furosemide (LASIX) 20 MG tablet Take 1 tablet (20 mg total) by mouth daily as needed. (Patient taking differently: Take 20 mg by mouth daily as needed for fluid. ) 90 tablet 3  . iron polysaccharides (NIFEREX) 150 MG capsule Take 1 capsule (150 mg total) by mouth daily. (Patient taking differently: Take 150 mg by mouth every evening. ) 30 capsule 6  . lactose free nutrition (BOOST) LIQD Take 237 mLs by mouth 2 (two) times a week.     . potassium chloride (K-DUR) 10 MEQ tablet Take 1 tablet (10 mEq total) by mouth daily. (Patient taking differently: Take 10 mEq by mouth daily as needed (Fluid). ) 90 tablet 3  . cephALEXin (KEFLEX) 500 MG capsule Take 500 mg by mouth 3 (three) times daily. Completed on 09/22/2015      Allergies:  Allergies  Allergen Reactions  . Pindolol Nausea Only and Swelling    Throat swells  . Iodine Other (See Comments)    BLISTERS  . Shellfish Allergy Diarrhea and Nausea And Vomiting  . Vibramycin [Doxycycline Calcium] Nausea Only and  Other (See Comments)    dizziness  . Doxycycline Nausea Only  . Levofloxacin Other (See Comments)    DIZZINESS  . Multaq [Dronedarone] Other (See Comments)    CAUSED SEVERE BURNING GI TRACT  . Vicodin [Hydrocodone-Acetaminophen] Other (See Comments)    dizziness    Family History  Problem Relation Age of Onset  . Heart disease Mother     Social History:  reports that she has never smoked. She has never used smokeless tobacco. She reports that she does not drink alcohol or use illicit drugs.  ROS: A complete review of systems was performed.  All systems are negative except for pertinent findings as noted.  Physical Exam:  Vital signs in last 24 hours: Temp:  [97.5 F (36.4 C)] 97.5 F (36.4 C) (03/06 0540) Pulse Rate:  [63] 63 (03/06 0540) Resp:  [16] 16 (03/06 0540) BP: (113)/(62)  113/62 mmHg (03/06 0540) SpO2:  [99 %] 99 % (03/06 0540) General:  Alert and oriented, No acute distress HEENT: Normocephalic, atraumatic Neck: No JVD or lymphadenopathy Cardiovascular: Regular rate and rhythm Lungs: Clear bilaterally Abdomen: Soft, nontender, nondistended, no abdominal masses Back: No CVA tenderness Extremities: No edema Neurologic: Grossly intact  Laboratory Data:  No results found for this or any previous visit (from the past 24 hour(s)). No results found for this or any previous visit (from the past 240 hour(s)). Creatinine:  Recent Labs  09/25/15 1130  CREATININE 0.94    Radiologic Imaging: No results found.  Impression/Assessment:  Recurrent ovarian tumor invading bladder with hematuria  Plan:  Gyrus TURBT  Jorja Loa 09/29/2015, 6:26 AM  Lillette Boxer. Laithan Conchas MD

## 2015-09-30 DIAGNOSIS — C7911 Secondary malignant neoplasm of bladder: Secondary | ICD-10-CM | POA: Diagnosis not present

## 2015-09-30 LAB — BASIC METABOLIC PANEL
ANION GAP: 7 (ref 5–15)
BUN: 8 mg/dL (ref 6–20)
CHLORIDE: 105 mmol/L (ref 101–111)
CO2: 27 mmol/L (ref 22–32)
Calcium: 8.7 mg/dL — ABNORMAL LOW (ref 8.9–10.3)
Creatinine, Ser: 0.81 mg/dL (ref 0.44–1.00)
GFR calc Af Amer: >60 mL/min (ref >60)
GLUCOSE: 102 mg/dL — AB (ref 65–99)
POTASSIUM: 4 mmol/L (ref 3.5–5.1)
Sodium: 139 mmol/L (ref 135–145)

## 2015-09-30 MED ORDER — CEPHALEXIN 500 MG PO CAPS: 500.0000 mg | ORAL_CAPSULE | Freq: Two times a day (BID) | ORAL | Status: DC

## 2015-09-30 NOTE — Discharge Instructions (Signed)
Transurethral Resection of Bladder Tumor (TURBT)  °Definition:  °Transurethral Resection of the Bladder Tumor is a surgical procedure used to diagnose and remove tumors within the bladder. TURBT is the most common treatment for early stage bladder cancer.  ° °General instructions:  °Your recent bladder surgery requires very little post hospital care but some definite precautions.  °Despite the fact that no skin incisions were used, the area around the tumor removal site is raw and covered with scabs to promote healing and prevent bleeding. Certain precautions are needed to insure that the scabs are not disturbed over the next 2-4 weeks while the healing proceeds.  °Because the raw surface inside your bladder and the irritating effects of urine you may expect frequency of urination and/or urgency (a stronger desire to urinate) and perhaps even getting up at night more often. This will usually resolve or improve slowly over the healing period. You may see some blood in your urine over the first 6 weeks. Do not be alarmed, even if the urine was clear for a while. Get off your feet and drink lots of fluids until clearing occurs. If you start to pass clots or don't improve call us.  ° °Diet:  °You may return to your normal diet immediately. Because of the raw surface of your bladder, alcohol, spicy foods, foods high in acid and drinks with caffeine may cause irritation or frequency and should be used in moderation. To keep your urine flowing freely and avoid constipation, drink plenty of fluids during the day (8-10 glasses). Tip: Avoid cranberry juice because it is very acidic. °  °Activity:  °Your physical activity needs to be restricted somewhat.  We suggest that you reduce your activity under the circumstances until the bleeding has stopped. Heavy lifting (greater than 20 lbs.) and heavy exertion should be limited for 2-3 weeks. ° °Bowels:  °It is important to keep your bowels regular during the postoperative period.  Straining with bowel movements can cause bleeding. A bowel movement every other day is reasonable. Use a mild laxative if needed, such as milk of magnesia 2-3 tablespoons, or 2 Dulcolax tablets. Call if you continue to have problems. If you had been taking narcotics for pain, before, during or after your surgery, you may be constipated.  ° °Medication:  °You should resume your pre-surgery medications unless told not to. In addition you may be given an antibiotic to prevent or treat infection. Antibiotics are not always necessary. All medication should be taken as prescribed until the bottles are finished unless you are having an unusual reaction to one of the drugs.  °General Anesthetic, Adult  °A doctor specialized in giving anesthesia (anesthesiologist) or a nurse specialized in giving anesthesia (nurse anesthetist) gives medicine that makes you sleep while a procedure is performed (general anesthetic). Once the general anesthetic has been administered, you will be in a sleeplike state in which you feel no pain. After having a general anesthetic you may feel:  °Dizzy.  °Weak.  °Drowsy.  °Confused.  °These feelings are normal and can be expected to last for up to 24 hours after the procedure. ° ° °LET YOUR CAREGIVER KNOW ABOUT:  °Allergies you have.  °Medications you are taking, including herbs, eye drops, over the counter medications, dietary supplements, and creams.  °Previous problems you have had with anesthetics or numbing medicines.  °Use of cigarettes, alcohol, or illicit drugs.  °Possibility of pregnancy, if this applies.  °History of bleeding or blood disorders, including blood clots and clotting   disorders.  °Previous surgeries you have had and types of anesthetics you have received.  °Family medical history, especially anesthetic problems.  °Other health problems.  ° °AFTER THE PROCEDURE  °After surgery, you will be taken to the recovery area where a nurse will monitor your progress. You will be allowed  to go home when you are awake, stable, taking fluids well, and without serious pain or complications.  °For the first 24 hours following an anesthetic:  °Have a responsible person with you.  °Do not drive a car. If you are alone, do not take public transportation.  °Do not engage in strenuous activity. You may usually resume normal activities the next day, or as advised by your caregiver.  °Do not drink alcohol.  °Do not take medicine that has not been prescribed by your caregiver.  °Do not sign important papers or make important decisions as your judgement may be impaired.  °You may resume a normal diet as directed.  °Change bandages (dressings) as directed.  °Only take over-the-counter or prescription medicines for pain, discomfort, or fever as directed by your caregiver.  °If you have questions or problems that seem related to the anesthetic, call the hospital and ask for the anesthetist, anesthesiologist, or anesthesia department.  ° °SEEK IMMEDIATE MEDICAL CARE IF:  °You develop a rash.  °You have difficulty breathing.  °You have chest pain.  °You have allergic problems.  °You have uncontrolled nausea.  °You have uncontrolled vomiting.  °You develop any serious bleeding, especially from the incision site.  °Document Released: 10/19/2007 Document Revised: 03/24/2011 Document Reviewed: 11/12/2010  °[employer]® Patient Information ©2012 [employer].  ° ° °

## 2015-10-02 NOTE — Addendum Note (Signed)
Addendum  created 10/02/15 1301 by Montez Hageman, MD   Modules edited: Anesthesia Responsible Staff

## 2015-10-03 NOTE — Discharge Summary (Signed)
Patient ID: Victoria [name] MRN: [id number] DOB/AGE: August 12, 1926 82 y.o.  Admit date: 09/29/2015 Discharge date: 10/03/2015  Primary Care Physician:  Donnajean Lopes, MD  Discharge Diagnoses:   Present on Admission:  . 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     diltiazem 360 MG 24 hr capsule  Commonly known as:  CARDIZEM CD  Take 1 capsule (360 mg total) by mouth daily.     furosemide 20 MG tablet  Commonly known as:  LASIX  Take 1 tablet (20 mg total) by mouth daily as needed.     GENTEAL OP  Apply 1 drop to eye 2 (two) times daily as needed (Dry eyes).     iron polysaccharides 150 MG capsule  Commonly known as:  NIFEREX  Take 1 capsule (150 mg total) by mouth daily.     lactose free nutrition Liqd  Take 237 mLs by mouth 2 (two) times a week.     potassium chloride 10 MEQ tablet  Commonly known as:  K-DUR  Take 1 tablet (10 mEq total) by mouth daily.         Significant Diagnostic Studies:  Ct Chest Wo Contrast  09/25/2015  CLINICAL DATA:  History of ovarian cancer diagnosed in 2013 and history of bladder cancer, presenting with right posterior chest wall pain. History of "rib removed in 1934" per EPIC. EXAM: CT CHEST WITHOUT CONTRAST TECHNIQUE: Multidetector CT imaging of the chest was performed following the standard protocol without IV contrast. COMPARISON:  08/31/2013 CT abdomen/ pelvis. 03/22/2012 PET-CT. No prior chest CT. 09/03/2013 chest radiograph. FINDINGS: Mediastinum/Nodes: Stable mild cardiomegaly. Two lead left subclavian pacemaker is noted with lead tips in the right atrium and right ventricular apex. No pericardial fluid/thickening. Coronary atherosclerosis. Great vessels are normal in course and caliber. Normal visualized thyroid.  Normal esophagus. No pathologically enlarged axillary, mediastinal or gross hilar lymph nodes, noting limited sensitivity for the detection of hilar adenopathy on this noncontrast study. Lungs/Pleura: No pneumothorax. No pleural effusion. There is a 1.8 x 0.9 cm tracheal diverticulum at the right posterior trachea at the level of the thoracic inlet (series 5/image 9). There are postsurgical changes in the right lower lobe suggestive of wedge resection. There is varicoid and cystic bronchiectasis with associated parenchymal bands, architectural distortion and mild pleural thickening adjacent to surgical sutures in the superior peripheral right lower lobe. These findings are not appreciably changed compared to the 03/22/2012 PET-CT study. Calcified subcentimeter granuloma in the left upper lobe. No acute consolidative airspace disease, significant pulmonary nodules or lung masses. Upper abdomen: New 3 mm calcification in the lower left kidney, which may represent a nonobstructing left renal stone. Musculoskeletal: No aggressive appearing focal osseous lesions. Stable healed deformity in the posterior right ninth and tenth ribs. Moderate degenerative changes in the thoracic spine. IMPRESSION: 1. No acute abnormality. Stable scarring and bronchiectasis at the site of prior right lower lobe lung surgery. Stable healed deformity in the posterior right ninth and tenth ribs. 2. Stable mild cardiomegaly without pulmonary edema. Coronary atherosclerosis. 3. New 3 left renal calcification, possibly a nonobstructing stone. Electronically Signed   By: Ilona Sorrel M.D.   On: 09/25/2015 14:31    Brief H and  P: For complete details please refer to admission H and P, but in brief the patient has a history of ovarian cancer with local extension into the dome of the bladder. She has had 2 prior palliative resections for gross hematuria from this tumor. She presents at this time for TURBT for palliative management of this tumor  extension into the bladder.  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 She tolerated the procedure well. She was left on continuous bladder irrigation for the day of the hospitalization. The next morning, the CBI was stopped, the catheter was removed and she voided adequately. She was discharged at that point.  Day of Discharge BP 112/60 mmHg  Pulse 65  Temp(Src) 98 F (36.7 C) (Oral)  Resp 18  Ht 5\' 1"  (1.549 m)  Wt 61.689 kg (136 lb)  BMI 25.71 kg/m2  SpO2 97%  No results found for this or any previous visit (from the past 24 hour(s)).  Physical Exam: General: Alert and awake oriented x3 not in any acute distress. HEENT: anicteric sclera, pupils reactive to light and accommodation CVS: S1-S2 clear no murmur rubs or gallops Chest: clear to auscultation bilaterally, no wheezing rales or rhonchi Abdomen: soft nontender, nondistended, normal bowel sounds, no organomegaly Extremities: no cyanosis, clubbing or edema noted bilaterally Neuro: Cranial nerves II-XII intact, no focal neurological deficits  Disposition:  Home  Diet:  No restrictions  Activity:  No restrictions   Disposition and Follow-up:     Discharge Instructions    Discharge patient    Complete by:  As directed             She will follow-up in the office as scheduled  TESTS THAT NEED FOLLOW-UP  Pathology review  DISCHARGE FOLLOW-UP Follow-up Information    Follow up with Jorja Loa, MD.   Specialty:  Urology   Why:  as scheduled   Contact information:   [place] [address] [phone]       Time spent on Discharge:  5 minutes  Signed: Jorja Loa 10/03/2015, 7:39 PM

## 2015-10-04 ENCOUNTER — Emergency Department (HOSPITAL_COMMUNITY): Payer: Medicare Other

## 2015-10-04 ENCOUNTER — Encounter (HOSPITAL_COMMUNITY): Payer: Self-pay | Admitting: *Deleted

## 2015-10-04 ENCOUNTER — Inpatient Hospital Stay (HOSPITAL_COMMUNITY)
Admission: EM | Admit: 2015-10-04 | Discharge: 2015-10-12 | DRG: 336 | Disposition: A | Discharge status: Home / Self Care | Payer: Medicare Other | Attending: Internal Medicine | Admitting: Internal Medicine

## 2015-10-04 DIAGNOSIS — R112 Nausea with vomiting, unspecified: Secondary | ICD-10-CM

## 2015-10-04 DIAGNOSIS — I2510 Atherosclerotic heart disease of native coronary artery without angina pectoris: Secondary | ICD-10-CM | POA: Diagnosis present

## 2015-10-04 DIAGNOSIS — K565 Intestinal adhesions [bands] with obstruction (postprocedural) (postinfection): Secondary | ICD-10-CM | POA: Diagnosis present

## 2015-10-04 DIAGNOSIS — Z950 Presence of cardiac pacemaker: Secondary | ICD-10-CM | POA: Diagnosis present

## 2015-10-04 DIAGNOSIS — Z888 Allergy status to other drugs, medicaments and biological substances status: Secondary | ICD-10-CM | POA: Diagnosis not present

## 2015-10-04 DIAGNOSIS — I10 Essential (primary) hypertension: Secondary | ICD-10-CM | POA: Diagnosis present

## 2015-10-04 DIAGNOSIS — Z881 Allergy status to other antibiotic agents status: Secondary | ICD-10-CM

## 2015-10-04 DIAGNOSIS — Z7901 Long term (current) use of anticoagulants: Secondary | ICD-10-CM | POA: Diagnosis not present

## 2015-10-04 DIAGNOSIS — C7911 Secondary malignant neoplasm of bladder: Secondary | ICD-10-CM | POA: Diagnosis present

## 2015-10-04 DIAGNOSIS — I495 Sick sinus syndrome: Secondary | ICD-10-CM | POA: Diagnosis present

## 2015-10-04 DIAGNOSIS — I959 Hypotension, unspecified: Secondary | ICD-10-CM | POA: Diagnosis not present

## 2015-10-04 DIAGNOSIS — Z91013 Allergy to seafood: Secondary | ICD-10-CM | POA: Diagnosis not present

## 2015-10-04 DIAGNOSIS — I4510 Unspecified right bundle-branch block: Secondary | ICD-10-CM | POA: Diagnosis not present

## 2015-10-04 DIAGNOSIS — R319 Hematuria, unspecified: Secondary | ICD-10-CM | POA: Diagnosis present

## 2015-10-04 DIAGNOSIS — Z923 Personal history of irradiation: Secondary | ICD-10-CM | POA: Diagnosis not present

## 2015-10-04 DIAGNOSIS — Z66 Do not resuscitate: Secondary | ICD-10-CM | POA: Diagnosis present

## 2015-10-04 DIAGNOSIS — Z90710 Acquired absence of both cervix and uterus: Secondary | ICD-10-CM

## 2015-10-04 DIAGNOSIS — Z823 Family history of stroke: Secondary | ICD-10-CM

## 2015-10-04 DIAGNOSIS — Z9049 Acquired absence of other specified parts of digestive tract: Secondary | ICD-10-CM | POA: Diagnosis not present

## 2015-10-04 DIAGNOSIS — I480 Paroxysmal atrial fibrillation: Secondary | ICD-10-CM | POA: Diagnosis present

## 2015-10-04 DIAGNOSIS — E785 Hyperlipidemia, unspecified: Secondary | ICD-10-CM | POA: Diagnosis present

## 2015-10-04 DIAGNOSIS — Z8249 Family history of ischemic heart disease and other diseases of the circulatory system: Secondary | ICD-10-CM | POA: Diagnosis not present

## 2015-10-04 DIAGNOSIS — Z885 Allergy status to narcotic agent status: Secondary | ICD-10-CM

## 2015-10-04 DIAGNOSIS — E871 Hypo-osmolality and hyponatremia: Secondary | ICD-10-CM | POA: Diagnosis not present

## 2015-10-04 DIAGNOSIS — Z933 Colostomy status: Secondary | ICD-10-CM

## 2015-10-04 DIAGNOSIS — K56609 Unspecified intestinal obstruction, unspecified as to partial versus complete obstruction: Secondary | ICD-10-CM | POA: Diagnosis present

## 2015-10-04 DIAGNOSIS — T451X5A Adverse effect of antineoplastic and immunosuppressive drugs, initial encounter: Secondary | ICD-10-CM | POA: Diagnosis present

## 2015-10-04 DIAGNOSIS — E876 Hypokalemia: Secondary | ICD-10-CM | POA: Diagnosis not present

## 2015-10-04 DIAGNOSIS — C569 Malignant neoplasm of unspecified ovary: Secondary | ICD-10-CM | POA: Diagnosis present

## 2015-10-04 DIAGNOSIS — Z79899 Other long term (current) drug therapy: Secondary | ICD-10-CM | POA: Diagnosis not present

## 2015-10-04 DIAGNOSIS — N390 Urinary tract infection, site not specified: Secondary | ICD-10-CM | POA: Insufficient documentation

## 2015-10-04 DIAGNOSIS — I4892 Unspecified atrial flutter: Secondary | ICD-10-CM | POA: Diagnosis not present

## 2015-10-04 DIAGNOSIS — Z9221 Personal history of antineoplastic chemotherapy: Secondary | ICD-10-CM

## 2015-10-04 DIAGNOSIS — R1110 Vomiting, unspecified: Secondary | ICD-10-CM | POA: Insufficient documentation

## 2015-10-04 DIAGNOSIS — K5730 Diverticulosis of large intestine without perforation or abscess without bleeding: Secondary | ICD-10-CM | POA: Diagnosis present

## 2015-10-04 DIAGNOSIS — D649 Anemia, unspecified: Secondary | ICD-10-CM | POA: Diagnosis present

## 2015-10-04 DIAGNOSIS — E860 Dehydration: Secondary | ICD-10-CM | POA: Diagnosis present

## 2015-10-04 DIAGNOSIS — R1084 Generalized abdominal pain: Secondary | ICD-10-CM

## 2015-10-04 DIAGNOSIS — E46 Unspecified protein-calorie malnutrition: Secondary | ICD-10-CM | POA: Diagnosis present

## 2015-10-04 DIAGNOSIS — K5669 Other intestinal obstruction: Secondary | ICD-10-CM | POA: Diagnosis not present

## 2015-10-04 LAB — CBC
HCT: 39.9 % (ref 36.0–46.0)
Hemoglobin: 13.5 g/dL (ref 12.0–15.0)
MCH: 31 pg (ref 26.0–34.0)
MCHC: 33.8 g/dL (ref 30.0–36.0)
MCV: 91.7 fL (ref 78.0–100.0)
PLATELETS: 245 10*3/uL (ref 150–400)
RBC: 4.35 MIL/uL (ref 3.87–5.11)
RDW: 14.3 % (ref 11.5–15.5)
WBC: 6.4 10*3/uL (ref 4.0–10.5)

## 2015-10-04 LAB — URINE MICROSCOPIC-ADD ON

## 2015-10-04 LAB — COMPREHENSIVE METABOLIC PANEL
ALBUMIN: 3.5 g/dL (ref 3.5–5.0)
ALK PHOS: 87 U/L (ref 38–126)
ALT: 14 U/L (ref 14–54)
ANION GAP: 10 (ref 5–15)
AST: 17 U/L (ref 15–41)
BILIRUBIN TOTAL: 0.6 mg/dL (ref 0.3–1.2)
BUN: 17 mg/dL (ref 6–20)
CALCIUM: 9.1 mg/dL (ref 8.9–10.3)
CO2: 28 mmol/L (ref 22–32)
CREATININE: 1.04 mg/dL — AB (ref 0.44–1.00)
Chloride: 96 mmol/L — ABNORMAL LOW (ref 101–111)
GFR calc non Af Amer: 47 mL/min — ABNORMAL LOW (ref >60)
GFR, EST AFRICAN AMERICAN: 55 mL/min — AB (ref >60)
GLUCOSE: 127 mg/dL — AB (ref 65–99)
POTASSIUM: 4.6 mmol/L (ref 3.5–5.1)
Sodium: 134 mmol/L — ABNORMAL LOW (ref 135–145)
TOTAL PROTEIN: 6.4 g/dL — AB (ref 6.5–8.1)

## 2015-10-04 LAB — LIPASE, BLOOD: Lipase: 22 U/L (ref 11–51)

## 2015-10-04 LAB — URINALYSIS, ROUTINE W REFLEX MICROSCOPIC
Glucose, UA: NEGATIVE mg/dL
KETONES UR: NEGATIVE mg/dL
NITRITE: NEGATIVE
PH: 6 (ref 5.0–8.0)
Specific Gravity, Urine: 1.026 (ref 1.005–1.030)

## 2015-10-04 MED ORDER — MORPHINE SULFATE (PF) 2 MG/ML IV SOLN: 1.0000 mg | INTRAVENOUS | Status: DC | PRN

## 2015-10-04 MED ORDER — DEXTROSE 5 % IV SOLN
1.0000 g | Freq: Once | INTRAVENOUS | Status: AC
  Administered 2015-10-04: 1 g via INTRAVENOUS
  Filled 2015-10-04: qty 10

## 2015-10-04 MED ORDER — SODIUM CHLORIDE 0.9 % IV SOLN
INTRAVENOUS | Status: AC
  Administered 2015-10-04: via INTRAVENOUS

## 2015-10-04 MED ORDER — IOHEXOL 300 MG/ML  SOLN
100.0000 mL | Freq: Once | INTRAMUSCULAR | Status: AC | PRN
  Administered 2015-10-04: 80 mL via INTRAVENOUS

## 2015-10-04 MED ORDER — ACETAMINOPHEN 325 MG PO TABS: 650.0000 mg | ORAL_TABLET | Freq: Four times a day (QID) | ORAL | Status: DC | PRN

## 2015-10-04 MED ORDER — CEFTRIAXONE SODIUM 1 G IJ SOLR
1.0000 g | INTRAMUSCULAR | Status: DC
  Administered 2015-10-05 – 2015-10-06 (×2): 1 g via INTRAVENOUS
  Filled 2015-10-04 (×2): qty 10

## 2015-10-04 MED ORDER — ENOXAPARIN SODIUM 30 MG/0.3ML ~~LOC~~ SOLN
30.0000 mg | SUBCUTANEOUS | Status: DC
  Administered 2015-10-04: 30 mg via SUBCUTANEOUS
  Filled 2015-10-04: qty 0.3

## 2015-10-04 MED ORDER — ONDANSETRON HCL 4 MG PO TABS
4.0000 mg | ORAL_TABLET | Freq: Four times a day (QID) | ORAL | Status: DC | PRN
  Administered 2015-10-07 – 2015-10-08 (×2): 4 mg via ORAL
  Filled 2015-10-04 (×2): qty 1

## 2015-10-04 MED ORDER — ONDANSETRON HCL 4 MG/2ML IJ SOLN
4.0000 mg | Freq: Four times a day (QID) | INTRAMUSCULAR | Status: DC | PRN
  Administered 2015-10-04 – 2015-10-09 (×3): 4 mg via INTRAVENOUS
  Filled 2015-10-04 (×3): qty 2

## 2015-10-04 MED ORDER — SODIUM CHLORIDE 0.9 % IV BOLUS (SEPSIS)
1000.0000 mL | Freq: Once | INTRAVENOUS | Status: AC
  Administered 2015-10-04: 1000 mL via INTRAVENOUS

## 2015-10-04 MED ORDER — ACETAMINOPHEN 650 MG RE SUPP: 650.0000 mg | Freq: Four times a day (QID) | RECTAL | Status: DC | PRN

## 2015-10-04 MED ORDER — ONDANSETRON HCL 4 MG/2ML IJ SOLN
4.0000 mg | Freq: Once | INTRAMUSCULAR | Status: AC
  Administered 2015-10-04: 4 mg via INTRAVENOUS
  Filled 2015-10-04: qty 2

## 2015-10-04 MED ORDER — ONDANSETRON 4 MG PO TBDP
4.0000 mg | ORAL_TABLET | Freq: Once | ORAL | Status: AC | PRN
  Administered 2015-10-04: 4 mg via ORAL
  Filled 2015-10-04: qty 1

## 2015-10-04 MED ORDER — IOHEXOL 300 MG/ML  SOLN
25.0000 mL | Freq: Once | INTRAMUSCULAR | Status: AC | PRN
  Administered 2015-10-04: 25 mL via ORAL

## 2015-10-04 NOTE — Consult Note (Signed)
Reason for Consult:abdominal pain Referring Physician: Dr. Roel Cluck  Victoria [name] is an 82 y.o. female.  HPI:  The patient is an 82 year old white female with a history of ovarian cancer. She has had a diverting colostomy by Dr. Donne Hazel about 3 years ago. Couple of days ago she underwent a transurethral resection of some bladder tumor and after that procedure she has been unable to have a bowel movement. She took some Mira lax and did have some significant output from the ostomy but the ostomy has quit putting out anything. She has had some abdominal distention over the last day or 2. She denies any fevers or chills. She came to the emergency department where a CT scan does show a small bowel obstruction.  Past Medical History  Diagnosis Date  . Hyperlipidemia   . Diverticulosis   . Colostomy in place [facility])   . Chronic anticoagulation     patient states chronic anticoagulation stopped 2015   . Fluid collection (edema) in the arms, legs, hands and feet   . Urinary frequency     with burning x 2 days  . Hypertension   . RBBB   . PAT (paroxysmal atrial tachycardia) ([place])   . Iron deficiency anemia   . Gross hematuria   . Cardiac pacemaker in [place]-- PLACEMENT 09-10-2008  . Paroxysmal atrial flutter ([facility])     CARDIOLOGIST--  DR CROITORU  . SSS (sick sinus syndrome) ([facility])     S/P PACEMAKER  . Bladder cancer [facility]) recurrent bladder tumor     secondarty to ovarian cancer--  palliative radiation 09-13-2013 to 10-22-2013  . Malignant neoplasm of ovary ([place]) ONOCOLOGIST--  DR Benay Spice  03-13-2012  w/ rectal mass    dx  primay ovarian cancer , +cytokeratin 7, estrogen receptor and WT-1--  chemotherapy and s/p omentectomy and bilateral oophorectomy03-05-2013  . Coronary artery disease     DR. CROITORU IS PT'S CARDIOLOGIST  . Presence of permanent cardiac pacemaker   . Urinary tract infection   . Nausea 2015    chronic intermittent nausea after chemo    Past Surgical History   Procedure Laterality Date  . Permanent pacemaker insertion  09-10-2008    [place]  . Rib removed  1934    Pleurisy and pneumonia  . Bladder neck suspension  1986  . Colon resection  04/04/2012    Procedure: COLON RESECTION;  Surgeon: Rolm Bookbinder, MD;  Location: [facility];  Service: General;  Laterality: N/A;  laparotomy with small bowel resection for obstruction with colostomy with gastrostomy tube placement.   . Portacath placement  05/04/2012    Procedure: INSERTION PORT-A-CATH;  Surgeon: Rolm Bookbinder, MD;  Location: [facility];  Service: General;  Laterality: Right;  . Laparotomy Bilateral 10/03/2012    Procedure: EXPLORATORY LAPAROTOMY;  Surgeon: Imagene Gurney A. Alycia Rossetti, MD;  Location: [facility];  Service: Gynecology;  Laterality: Bilateral;  WITH BSO, TUMOR DEBULKING  . Colostomy revision N/A 10/03/2012    Procedure: COLOSTOMY REVISION;  Surgeon: Rolm Bookbinder, MD;  Location: [facility];  Service: General;  Laterality: N/A;  COLOSTOMY REVISION, Sigmoid colectomy  . Port-a-cath removal N/A 02/21/2013    Procedure: REMOVAL PORT-A-CATH;  Surgeon: Rolm Bookbinder, MD;  Location: [facility];  Service: General;  Laterality: N/A;  . Transurethral resection of bladder tumor with gyrus (turbt-gyrus) N/A 09/05/2013    Procedure: TRANSURETHRAL RESECTION OF BLADDER TUMOR WITH GYRUS (TURBT-GYRUS);  Surgeon: Fredricka Bonine, MD;  Location: [facility];  Service: Urology;  Laterality: N/A;  . Transurethral resection of bladder tumor N/A 06/27/2014    Procedure: TRANSURETHRAL RESECTION OF BLADDER TUMOR (TURBT);  Surgeon: Chelsea Aus, MD;  Location: [facility];  Service: Urology;  Laterality: N/A;  . Cystoscopy N/A 06/27/2014    Procedure: CYSTOSCOPY;  Surgeon: Chelsea Aus, MD;  Location: [facility];  Service: Urology;  Laterality: N/A;  . Abdominal hysterectomy  1972  . Cardiac electrophysiology mapping and ablation  05-21-2008   dr Graciela Husbands    a-flutter  . Electrophysiology study  09-26-2008  dr Graciela Husbands   03-27-2012  dr croitoru    cardiac ,  no ablation in 2010/   2013  overdrive pacing atrial flutter  . Transthoracic echocardiogram  07-17-2008      ef >55%,  mild to moderate MR and TR,  mild  sclerotic AV without stenosis,    . Cardiovascular stress test  07-17-2008  dr croitoru    normal myocardial perfusion w/ attenuation artifact in the anterior region of mycardium;  no ischemia or infarct/scar/  low risk scan/  normal LV function and wall motion , ef  83%  . Transurethral resection of bladder tumor N/A 11/18/2014    Procedure: TRANSURETHRAL RESECTION OF BLADDER TUMOR (TURBT);  Surgeon: Marcine Matar, MD;  Location[facility];  Service: Urology;  Laterality: N/A;  . Cystoscopy N/A 11/18/2014    Procedure: CYSTOSCOPY;  Surgeon: Marcine Matar, MD;  Location[facility];  Service: Urology;  Laterality: N/A;  . Transurethral resection of bladder tumor with gyrus (turbt-gyrus) N/A 09/29/2015    Procedure: TRANSURETHRAL RESECTION OF BLADDER TUMOR WITH GYRUS (TURBT-GYRUS);  Surgeon: Marcine Matar, MD;  Location: [facility];  Service: Urology;  Laterality: N/A;    Family History  Problem Relation Age of Onset  . Heart disease Mother     Social History:  reports that she has never smoked. She has never used smokeless tobacco. She reports that she does not drink alcohol or use illicit drugs.  Allergies:  Allergies  Allergen Reactions  . Pindolol Nausea Only and Swelling    Throat swells  . Iodine Other (See Comments)    BLISTERS  . Shellfish Allergy Diarrhea and Nausea And Vomiting  . Vibramycin [Doxycycline Calcium] Nausea Only and Other (See Comments)    dizziness  . Doxycycline Nausea Only  . Levofloxacin Other (See Comments)    DIZZINESS  . Multaq [Dronedarone] Other (See Comments)    CAUSED SEVERE BURNING GI TRACT  . Vicodin [Hydrocodone-Acetaminophen] Other (See Comments)    dizziness    Medications: I have reviewed the patient's current  medications.  Results for orders placed or performed during the hospital encounter of 10/04/15 (from the past 48 hour(s))  Lipase, blood     Status: None   Collection Time: 10/04/15 12:22 PM  Result Value Ref Range   Lipase 22 11 - 51 U/L  Comprehensive metabolic panel     Status: Abnormal   Collection Time: 10/04/15 12:22 PM  Result Value Ref Range   Sodium 134 (L) 135 - 145 mmol/L   Potassium 4.6 3.5 - 5.1 mmol/L   Chloride 96 (L) 101 - 111 mmol/L   CO2 28 22 - 32 mmol/L   Glucose, Bld 127 (H) 65 - 99 mg/dL   BUN 17 6 - 20 mg/dL   Creatinine, Ser 0.36 (H) 0.44 - 1.00 mg/dL   Calcium 9.1 8.9 - 64.7 mg/dL   Total Protein 6.4 (L) 6.5 - 8.1 g/dL   Albumin  3.5 3.5 - 5.0 g/dL   AST 17 15 - 41 U/L   ALT 14 14 - 54 U/L   Alkaline Phosphatase 87 38 - 126 U/L   Total Bilirubin 0.6 0.3 - 1.2 mg/dL   GFR calc non Af Amer 47 (L) >60 mL/min   GFR calc Af Amer 55 (L) >60 mL/min    Comment: (NOTE) The eGFR has been calculated using the CKD EPI equation. This calculation has not been validated in all clinical situations. eGFR's persistently <60 mL/min signify possible Chronic Kidney Disease.    Anion gap 10 5 - 15  CBC     Status: None   Collection Time: 10/04/15 12:22 PM  Result Value Ref Range   WBC 6.4 4.0 - 10.5 K/uL   RBC 4.35 3.87 - 5.11 MIL/uL   Hemoglobin 13.5 12.0 - 15.0 g/dL   HCT 56.3 63.3 - 01.9 %   MCV 91.7 78.0 - 100.0 fL   MCH 31.0 26.0 - 34.0 pg   MCHC 33.8 30.0 - 36.0 g/dL   RDW 73.6 14.9 - 29.8 %   Platelets 245 150 - 400 K/uL  Urinalysis, Routine w reflex microscopic (not at [facility])     Status: Abnormal   Collection Time: 10/04/15  1:14 PM  Result Value Ref Range   Color, Urine AMBER (A) YELLOW    Comment: BIOCHEMICALS MAY BE AFFECTED BY COLOR   APPearance TURBID (A) CLEAR   Specific Gravity, Urine 1.026 1.005 - 1.030   pH 6.0 5.0 - 8.0   Glucose, UA NEGATIVE NEGATIVE mg/dL   Hgb urine dipstick LARGE (A) NEGATIVE   Bilirubin Urine SMALL (A) NEGATIVE    Ketones, ur NEGATIVE NEGATIVE mg/dL   Protein, ur >821 (A) NEGATIVE mg/dL   Nitrite NEGATIVE NEGATIVE   Leukocytes, UA MODERATE (A) NEGATIVE  Urine microscopic-add on     Status: Abnormal   Collection Time: 10/04/15  1:14 PM  Result Value Ref Range   Squamous Epithelial / LPF 0-5 (A) NONE SEEN   WBC, UA TOO NUMEROUS TO COUNT 0 - 5 WBC/hpf   RBC / HPF TOO NUMEROUS TO COUNT 0 - 5 RBC/hpf   Bacteria, UA MANY (A) NONE SEEN    Ct Abdomen Pelvis W Contrast  10/04/2015  CLINICAL DATA:  History of bladder cancer, bladder tumor removed on Monday. Abdominal pain, vomiting. EXAM: CT ABDOMEN AND PELVIS WITH CONTRAST TECHNIQUE: Multidetector CT imaging of the abdomen and pelvis was performed using the standard protocol following bolus administration of intravenous contrast. CONTRAST:  15mL OMNIPAQUE IOHEXOL 300 MG/ML SOLN, 43mL OMNIPAQUE IOHEXOL 300 MG/ML SOLN COMPARISON:  08/31/2013 FINDINGS: Lower chest: There is cardiomegaly. Pacer wires noted in the right heart. Coronary artery calcifications present. Bilateral lower lobe atelectasis. No pleural effusions. Hepatobiliary: Unremarkable appearance Pancreas: Atrophic.  No mass appear Spleen: Unremarkable appearance Adrenals/Urinary Tract: Areas of scarring in the kidneys bilaterally. Small cysts in the right kidney. No hydronephrosis. Adrenal glands are unremarkable. Bladder is decompressed. Within the right lower pelvis, there is a 5.2 x 4.9 cm rim enhancing mass with central low-density and gas. This likely reflects a residual tumor. Stomach/Bowel: Patient is status post left lower quadrant colostomy. Extensive diverticulosis throughout the colon. There is small bowel dilatation throughout the abdomen and into the pelvis. Distal small bowel is decompressed. In addition, there is swirled appearance of the mesenteric vessels centrally. This is concerning for internal hernia or low less likely volvulus as the cause of the obstruction. There is moderate free fluid in  the pelvis. Small amount of free fluid around the spleen and liver. Free fluid also noted within the left lower quadrant ostomy site. Vascular/Lymphatic: Aorta is heavily calcified, non aneurysmal. No adenopathy. Reproductive: Prior hysterectomy.  No adnexal masses. Other: No free air Musculoskeletal: Diffuse degenerative changes throughout the lumbar spine. IMPRESSION: Small bowel obstruction. There is a swirled appearance of the central mesenteric vessels suggesting the cause of the obstruction is internal hernia or less likely volvulus. Associated free fluid in the abdomen and pelvis. Left lower quadrant ostomy with extensive diverticular disease throughout the decompressed colon. Peripherally enhancing mass within the lower right pelvis, likely residual bladder wall tumor. Cardiomegaly, coronary artery disease. Bibasilar atelectasis. Electronically Signed   By: Rolm Baptise M.D.   On: 10/04/2015 17:05    Review of Systems  Constitutional: Negative.   HENT: Negative.   Eyes: Negative.   Respiratory: Negative.   Cardiovascular: Negative.   Gastrointestinal: Positive for nausea, vomiting and abdominal pain.  Genitourinary: Negative.   Musculoskeletal: Negative.   Skin: Negative.   Neurological: Negative.   Endo/Heme/Allergies: Negative.   Psychiatric/Behavioral: Negative.    Blood pressure 153/90, pulse 77, temperature 97.7 F (36.5 C), temperature source Oral, resp. rate 18, SpO2 95 %. Physical Exam  Constitutional: She is oriented to person, place, and time. She appears well-developed and well-nourished.  HENT:  Head: Normocephalic and atraumatic.  Eyes: Conjunctivae and EOM are normal. Pupils are equal, round, and reactive to light.  Neck: Normal range of motion. Neck supple.  Cardiovascular: Normal rate, regular rhythm and normal heart sounds.   Respiratory: Effort normal and breath sounds normal.  GI: Soft.  There is distension and mild tenderness but no guarding. Ostomy is pink  with a small amount of output  Musculoskeletal: Normal range of motion.  Neurological: She is alert and oriented to person, place, and time.  Skin: Skin is warm and dry.  Psychiatric: She has a normal mood and affect. Her behavior is normal.    Assessment/Plan:  The patient does appear to have a small bowel obstruction. At this point I would recommend bowel rest and NG tube decompression. We will follow her closely with you. We will repeat her abdominal x-rays in the morning.  TOTH III,Chavie Kolinski S 10/04/2015, 7:36 PM

## 2015-10-04 NOTE — ED Notes (Signed)
SPOKE WITH SAM FROM RADIOLOGY, SHE WILL CONTACT THE ADMITTING PHYSICIAN ABOUT THE STAT ABD X-RAY.

## 2015-10-04 NOTE — ED Provider Notes (Signed)
Pt seen and evaluated.  H/O Ovarian CA, and s/p colectomy 2013, then revision with colostomy 2014. Tumor with local invasin into bladder, and s/p TURBT 5 days ago.  Had ostomy output yesterday am. Has n/V for last 2 days.  Today no vomiting since early am, but no output, and increased distention.  Exam with quiet abdomen, but not silent.  Distended and tender, but not peritoneal.  Will place NG, and discuss with [facility] and [facility].  Tanna Furry, MD 10/04/15 [phone]

## 2015-10-04 NOTE — H&P (Signed)
PCP:  Donnajean Lopes, MD  Oncology [facility] Cardiology [facility] [place]  Referring provider Jeneen Rinks   Chief Complaint: Nausea vomiting  HPI: Victoria [name] is a 82 y.o. female   has a past medical history of Hyperlipidemia; Diverticulosis; Colostomy in place [facility]); Chronic anticoagulation; Fluid collection (edema) in the arms, legs, hands and feet; Urinary frequency; Hypertension; RBBB; PAT (paroxysmal atrial tachycardia) ([place]); Iron deficiency anemia; Gross hematuria; Cardiac pacemaker in situ; Paroxysmal atrial flutter (HCC); SSS (sick sinus syndrome) ([place]); Bladder cancer (HCC) (recurrent bladder tumor ); Malignant neoplasm of ovary ([place]) (ONOCOLOGIST--  DR Benay Spice  03-13-2012  w/ rectal mass); Coronary artery disease; Presence of permanent cardiac pacemaker; Urinary tract infection; and Nausea (2015).   Presented with nausea and vomiting for the past 48 hours. She has undergone bladder tumor resection on Monday and has been treated with Keflex for persistent pyuria. She is no longer on chemotherapy for history of metastatic ovarian cancer. Currently on palliative approach. Status post colectomy. She has been having some constipation but was able to have a bowel movement to the use of Mirapex and Colace from her ostomy. Started today she started to have nausea vomiting she's been having some abdominal pain feeling that cramps for the past 3-4 days. Denies any fever no diarrhea no chest pain.   IN ER: Orbital con 6.4 sodium 134 creatinine 1.04 patient afebrile. CT showing small bowel obstruction likely secondary to internal hernia surgical consult has been calledE patient but recommend medical admission at this point an NG tube placement   Regarding pertinent past history: Patient has been diagnosed with ovarian cancer as 2014 with involvement of colon As well as spread to the bladder. Status post palliative radiation therapy 2015 he has extensive intra-abdominal surgeries  including colon resection due to obstruction with colostomy placement in 2013 status post revision in 2014, exploratory laparotomy in 2014. She has undergone and now both transurethral resections of bladder tumor last one was done just a few days ago.   History of paroxysmal atrial fibrillation followed by cardiology history of sick sinus syndrome status post pacemaker She has history of coronary artery disease followed by cardiology  Hospitalist was called for admission for small bowel obstruction and a setting of metastatic ovarian cancer  Review of Systems:    Pertinent positives include:  nausea, vomiting, abdominal pain, hematuria  Constitutional:  No weight loss, night sweats, Fevers, chills, fatigue, weight loss  HEENT:  No headaches, Difficulty swallowing,Tooth/dental problems,Sore throat,  No sneezing, itching, ear ache, nasal congestion, post nasal drip,  Cardio-vascular:  No chest pain, Orthopnea, PND, anasarca, dizziness, palpitations.no Bilateral lower extremity swelling  GI:  No heartburn, indigestion,  diarrhea, change in bowel habits, loss of appetite, melena, blood in stool, hematemesis Resp:  no shortness of breath at rest. No dyspnea on exertion, No excess mucus, no productive cough, No non-productive cough, No coughing up of blood.No change in color of mucus.No wheezing. Skin:  no rash or lesions. No jaundice GU:  no dysuria, change in color of urine, no urgency or frequency. No straining to urinate.  No flank pain.  Musculoskeletal:  No joint pain or no joint swelling. No decreased range of motion. No back pain.  Psych:  No change in mood or affect. No depression or anxiety. No memory loss.  Neuro: no localizing neurological complaints, no tingling, no weakness, no double vision, no gait abnormality, no slurred speech, no confusion  Otherwise ROS are negative except for above, 10 systems were reviewed  Past Medical History: Past Medical History  Diagnosis  Date  . Hyperlipidemia   . Diverticulosis   . Colostomy in place [facility])   . Chronic anticoagulation     patient states chronic anticoagulation stopped 2015   . Fluid collection (edema) in the arms, legs, hands and feet   . Urinary frequency     with burning x 2 days  . Hypertension   . RBBB   . PAT (paroxysmal atrial tachycardia) ([place])   . Iron deficiency anemia   . Gross hematuria   . Cardiac pacemaker in [place]-- PLACEMENT 09-10-2008  . Paroxysmal atrial flutter (HCC)     CARDIOLOGIST--  DR CROITORU  . SSS (sick sinus syndrome) (HCC)     S/P PACEMAKER  . Bladder cancer [facility]) recurrent bladder tumor     secondarty to ovarian cancer--  palliative radiation 09-13-2013 to 10-22-2013  . Malignant neoplasm of ovary ([place]) ONOCOLOGIST--  DR Benay Spice  03-13-2012  w/ rectal mass    dx  primay ovarian cancer , +cytokeratin 7, estrogen receptor and WT-1--  chemotherapy and s/p omentectomy and bilateral oophorectomy03-05-2013  . Coronary artery disease     DR. CROITORU IS PT'S CARDIOLOGIST  . Presence of permanent cardiac pacemaker   . Urinary tract infection   . Nausea 2015    chronic intermittent nausea after chemo   Past Surgical History  Procedure Laterality Date  . Permanent pacemaker insertion  09-10-2008    [place]  . Rib removed  1934    Pleurisy and pneumonia  . Bladder neck suspension  1986  . Colon resection  04/04/2012    Procedure: COLON RESECTION;  Surgeon: Rolm Bookbinder, MD;  Location: [facility];  Service: General;  Laterality: N/A;  laparotomy with small bowel resection for obstruction with colostomy with gastrostomy tube placement.   . Portacath placement  05/04/2012    Procedure: INSERTION PORT-A-CATH;  Surgeon: Rolm Bookbinder, MD;  Location: [facility];  Service: General;  Laterality: Right;  . Laparotomy Bilateral 10/03/2012    Procedure: EXPLORATORY LAPAROTOMY;  Surgeon: Imagene Gurney A. Alycia Rossetti, MD;  Location: [facility];  Service: Gynecology;  Laterality: Bilateral;   WITH BSO, TUMOR DEBULKING  . Colostomy revision N/A 10/03/2012    Procedure: COLOSTOMY REVISION;  Surgeon: Rolm Bookbinder, MD;  Location: [facility];  Service: General;  Laterality: N/A;  COLOSTOMY REVISION, Sigmoid colectomy  . Port-a-cath removal N/A 02/21/2013    Procedure: REMOVAL PORT-A-CATH;  Surgeon: Rolm Bookbinder, MD;  Location: [facility];  Service: General;  Laterality: N/A;  . Transurethral resection of bladder tumor with gyrus (turbt-gyrus) N/A 09/05/2013    Procedure: TRANSURETHRAL RESECTION OF BLADDER TUMOR WITH GYRUS (TURBT-GYRUS);  Surgeon: Fredricka Bonine, MD;  Location: [facility];  Service: Urology;  Laterality: N/A;  . Transurethral resection of bladder tumor N/A 06/27/2014    Procedure: TRANSURETHRAL RESECTION OF BLADDER TUMOR (TURBT);  Surgeon: Jorja Loa, MD;  Location: [facility];  Service: Urology;  Laterality: N/A;  . Cystoscopy N/A 06/27/2014    Procedure: CYSTOSCOPY;  Surgeon: Jorja Loa, MD;  Location: [facility];  Service: Urology;  Laterality: N/A;  . Abdominal hysterectomy  1972  . Cardiac electrophysiology mapping and ablation  05-21-2008   dr Caryl Comes    a-flutter  . Electrophysiology study  09-26-2008  dr Caryl Comes  03-27-2012  dr croitoru    cardiac ,  no ablation in 2010/   2013  overdrive pacing atrial flutter  . Transthoracic echocardiogram  07-17-2008  ef >55%,  mild to moderate MR and TR,  mild  sclerotic AV without stenosis,    . Cardiovascular stress test  07-17-2008  dr croitoru    normal myocardial perfusion w/ attenuation artifact in the anterior region of mycardium;  no ischemia or infarct/scar/  low risk scan/  normal LV function and wall motion , ef  83%  . Transurethral resection of bladder tumor N/A 11/18/2014    Procedure: TRANSURETHRAL RESECTION OF BLADDER TUMOR (TURBT);  Surgeon: Franchot Gallo, MD;  Location[facility];  Service: Urology;  Laterality: N/A;  . Cystoscopy N/A 11/18/2014    Procedure: CYSTOSCOPY;   Surgeon: Franchot Gallo, MD;  Location: [facility];  Service: Urology;  Laterality: N/A;  . Transurethral resection of bladder tumor with gyrus (turbt-gyrus) N/A 09/29/2015    Procedure: TRANSURETHRAL RESECTION OF BLADDER TUMOR WITH GYRUS (TURBT-GYRUS);  Surgeon: Franchot Gallo, MD;  [facility];  Service: Urology;  Laterality: N/A;     Medications: Prior to Admission medications   Medication Sig Start Date End Date Taking? Authorizing Provider  Carboxymethylcell-Hypromellose (GENTEAL OP) Apply 1 drop to eye 2 (two) times daily as needed (Dry eyes).   Yes Historical Provider, MD  cephALEXin (KEFLEX) 500 MG capsule Take 1 capsule (500 mg total) by mouth 2 (two) times daily. 09/30/15  Yes Franchot Gallo, MD  diltiazem (CARDIZEM CD) 360 MG 24 hr capsule Take 1 capsule (360 mg total) by mouth daily. Patient taking differently: Take 360 mg by mouth every morning.  09/24/14  Yes Mihai Croitoru, MD  docusate sodium (COLACE) 100 MG capsule Take 100 mg by mouth daily as needed for mild constipation.   Yes Historical Provider, MD  furosemide (LASIX) 20 MG tablet Take 1 tablet (20 mg total) by mouth daily as needed. Patient taking differently: Take 20 mg by mouth daily as needed for fluid.  07/01/15  Yes Mihai [facility], MD  iron polysaccharides (NIFEREX) 150 MG capsule Take 1 capsule (150 mg total) by mouth daily. Patient taking differently: Take 150 mg by mouth every evening.  09/07/13  Yes Leanna Battles, MD  lactose free nutrition (BOOST) LIQD Take 237 mLs by mouth 2 (two) times a week.    Yes Historical Provider, MD  polyethylene glycol (MIRALAX / GLYCOLAX) packet Take 17 g by mouth daily as needed for mild constipation or moderate constipation.   Yes Historical Provider, MD  potassium chloride (K-DUR) 10 MEQ tablet Take 1 tablet (10 mEq total) by mouth daily. Patient taking differently: Take 10 mEq by mouth daily as needed (Fluid).  06/09/15  Yes Sanda Klein, MD     Allergies:   Allergies  Allergen Reactions  . Pindolol Nausea Only and Swelling    Throat swells  . Iodine Other (See Comments)    BLISTERS  . Shellfish Allergy Diarrhea and Nausea And Vomiting  . Vibramycin [Doxycycline Calcium] Nausea Only and Other (See Comments)    dizziness  . Doxycycline Nausea Only  . Levofloxacin Other (See Comments)    DIZZINESS  . Multaq [Dronedarone] Other (See Comments)    CAUSED SEVERE BURNING GI TRACT  . Vicodin [Hydrocodone-Acetaminophen] Other (See Comments)    dizziness    Social History:  Ambulatory   [facility] or  Kasandra Knudsen,  Lives at home   With family husband who has severe arthritis but still able to take care of himself     reports that she has never smoked. She has never used smokeless tobacco. She reports that she does  not drink alcohol or use illicit drugs.     Family History: family history includes Heart disease in her mother.    Physical Exam: Patient Vitals for the past 24 hrs:  BP Temp Temp src Pulse Resp SpO2  10/04/15 1559 153/90 mmHg - - 77 18 95 %  10/04/15 1319 140/84 mmHg 97.7 F (36.5 C) Oral 70 19 97 %  10/04/15 1152 131/82 mmHg 97.4 F (36.3 C) Oral 76 18 95 %    1. General:  in No Acute distress 2. Psychological: Alert and  Oriented 3. Head/ENT:     Dry Mucous Membranes                          Head Non traumatic, neck supple                          Normal  Dentition 4. SKIN:   decreased Skin turgor,  Skin clean Dry and intact no rash 5. Heart: Regular rate and rhythm no  Murmur, Rub or gallop 6. Lungs:   Clear to auscultation bilaterally, no wheezes or crackles   7. Abdomen: Soft, non-tender, Non distended 8. Lower extremities: no clubbing, cyanosis, or edema 9. Neurologically Grossly intact, moving all 4 extremities equally 10. MSK: Normal range of motion  body mass index is unknown because there is no weight on file.   Labs on Admission:   Results for orders placed or performed during the hospital  encounter of 10/04/15 (from the past 24 hour(s))  Lipase, blood     Status: None   Collection Time: 10/04/15 12:22 PM  Result Value Ref Range   Lipase 22 11 - 51 U/L  Comprehensive metabolic panel     Status: Abnormal   Collection Time: 10/04/15 12:22 PM  Result Value Ref Range   Sodium 134 (L) 135 - 145 mmol/L   Potassium 4.6 3.5 - 5.1 mmol/L   Chloride 96 (L) 101 - 111 mmol/L   CO2 28 22 - 32 mmol/L   Glucose, Bld 127 (H) 65 - 99 mg/dL   BUN 17 6 - 20 mg/dL   Creatinine, Ser 1.04 (H) 0.44 - 1.00 mg/dL   Calcium 9.1 8.9 - 10.3 mg/dL   Total Protein 6.4 (L) 6.5 - 8.1 g/dL   Albumin 3.5 3.5 - 5.0 g/dL   AST 17 15 - 41 U/L   ALT 14 14 - 54 U/L   Alkaline Phosphatase 87 38 - 126 U/L   Total Bilirubin 0.6 0.3 - 1.2 mg/dL   GFR calc non Af Amer 47 (L) >60 mL/min   GFR calc Af Amer 55 (L) >60 mL/min   Anion gap 10 5 - 15  CBC     Status: None   Collection Time: 10/04/15 12:22 PM  Result Value Ref Range   WBC 6.4 4.0 - 10.5 K/uL   RBC 4.35 3.87 - 5.11 MIL/uL   Hemoglobin 13.5 12.0 - 15.0 g/dL   HCT 39.9 36.0 - 46.0 %   MCV 91.7 78.0 - 100.0 fL   MCH 31.0 26.0 - 34.0 pg   MCHC 33.8 30.0 - 36.0 g/dL   RDW 14.3 11.5 - 15.5 %   Platelets 245 150 - 400 K/uL  Urinalysis, Routine w reflex microscopic (not at [facility])     Status: Abnormal   Collection Time: 10/04/15  1:14 PM  Result Value Ref Range   Color, Urine AMBER (A) YELLOW  APPearance TURBID (A) CLEAR   Specific Gravity, Urine 1.026 1.005 - 1.030   pH 6.0 5.0 - 8.0   Glucose, UA NEGATIVE NEGATIVE mg/dL   Hgb urine dipstick LARGE (A) NEGATIVE   Bilirubin Urine SMALL (A) NEGATIVE   Ketones, ur NEGATIVE NEGATIVE mg/dL   Protein, ur >300 (A) NEGATIVE mg/dL   Nitrite NEGATIVE NEGATIVE   Leukocytes, UA MODERATE (A) NEGATIVE  Urine microscopic-add on     Status: Abnormal   Collection Time: 10/04/15  1:14 PM  Result Value Ref Range   Squamous Epithelial / LPF 0-5 (A) NONE SEEN   WBC, UA TOO NUMEROUS TO COUNT 0 - 5 WBC/hpf   RBC  / HPF TOO NUMEROUS TO COUNT 0 - 5 RBC/hpf   Bacteria, UA MANY (A) NONE SEEN    UA too numerous to count white cells as well as red blood cells many bacteria  No results found for: HGBA1C  Estimated Creatinine Clearance: 32.7 mL/min (by C-G formula based on Cr of 1.04).  BNP (last 3 results) No results for input(s): PROBNP in the last 8760 hours.  Other results:  I have pearsonaly reviewed this:    There were no vitals filed for this visit.   Cultures:    Component Value Date/Time   SDES URINE, CATHETERIZED 09/02/2013 1559   SPECREQUEST NONE 09/02/2013 1559   CULT NO GROWTH Performed at [facility] 09/02/2013 1559   REPTSTATUS 09/03/2013 FINAL 09/02/2013 1559     Radiological Exams on Admission: Ct Abdomen Pelvis W Contrast  10/04/2015  CLINICAL DATA:  History of bladder cancer, bladder tumor removed on Monday. Abdominal pain, vomiting. EXAM: CT ABDOMEN AND PELVIS WITH CONTRAST TECHNIQUE: Multidetector CT imaging of the abdomen and pelvis was performed using the standard protocol following bolus administration of intravenous contrast. CONTRAST:  87mL OMNIPAQUE IOHEXOL 300 MG/ML SOLN, 82mL OMNIPAQUE IOHEXOL 300 MG/ML SOLN COMPARISON:  08/31/2013 FINDINGS: Lower chest: There is cardiomegaly. Pacer wires noted in the right heart. Coronary artery calcifications present. Bilateral lower lobe atelectasis. No pleural effusions. Hepatobiliary: Unremarkable appearance Pancreas: Atrophic.  No mass appear Spleen: Unremarkable appearance Adrenals/Urinary Tract: Areas of scarring in the kidneys bilaterally. Small cysts in the right kidney. No hydronephrosis. Adrenal glands are unremarkable. Bladder is decompressed. Within the right lower pelvis, there is a 5.2 x 4.9 cm rim enhancing mass with central low-density and gas. This likely reflects a residual tumor. Stomach/Bowel: Patient is status post left lower quadrant colostomy. Extensive diverticulosis throughout the colon. There is small  bowel dilatation throughout the abdomen and into the pelvis. Distal small bowel is decompressed. In addition, there is swirled appearance of the mesenteric vessels centrally. This is concerning for internal hernia or low less likely volvulus as the cause of the obstruction. There is moderate free fluid in the pelvis. Small amount of free fluid around the spleen and liver. Free fluid also noted within the left lower quadrant ostomy site. Vascular/Lymphatic: Aorta is heavily calcified, non aneurysmal. No adenopathy. Reproductive: Prior hysterectomy.  No adnexal masses. Other: No free air Musculoskeletal: Diffuse degenerative changes throughout the lumbar spine. IMPRESSION: Small bowel obstruction. There is a swirled appearance of the central mesenteric vessels suggesting the cause of the obstruction is internal hernia or less likely volvulus. Associated free fluid in the abdomen and pelvis. Left lower quadrant ostomy with extensive diverticular disease throughout the decompressed colon. Peripherally enhancing mass within the lower right pelvis, likely residual bladder wall tumor. Cardiomegaly, coronary artery disease. Bibasilar atelectasis. Electronically Signed   By:  Rolm Baptise M.D.   On: 10/04/2015 17:05    Chart has been reviewed  Family  at  Bedside  plan of care was discussed with  Gretchyn Brackins [phone] Home [phone] cell He is a MPOA  Assessment/Plan  82 yo with ovarian cancer sp colon resection and bladder mass resection here with small bowel obstruction  Present on Admission:  . SBO (small bowel obstruction) ([place]) - appreciate surgery consult , NG tube placement supportive management serial abdominal exams and repeat KUB in the morning  . Malignant neoplasm of ovary (HCC)-currently palliative approach would let oncology note patient has been admitted to the morning  . PAF fib/flutter- Hx RFA 2010 currently appears to be well rate controlled despite having been taking her Cardizem  secondary to abdominal pain. We'll continue to hold off for now monitor blood pressure and heart rate. If needed will start on IV alternative  . Dehydration in the setting of small bowel obstruction. Will rehydrate  . Unspecified protein-calorie malnutrition ([place]) readdress when patient able to tolerate by mouth   possible UTI. Urology has started her on Keflex will continue Rocephin   Prophylaxis: SCD    CODE STATUS:    DNR/DNI as per patient    Disposition:   To home once workup is complete and patient is stable  Other plan as per orders.  I have spent a total of 54 min on this admission    Wm Fruchter 10/04/2015, 8:01 PM    [facility]  Pager [phone]   after 2 AM please page floor coverage PA If 7AM-7PM, please contact the day team taking care of the patient  [email or web address]  Password [email or web address]

## 2015-10-04 NOTE — ED Notes (Addendum)
Pt reports hx of bladder cancer, had a bladder tumor removed on Monday - pt experienced some constipation throughout the week however had normal BM output from ostomy yesterday after miralax and colace use - pt presents today w/ c/o n/v and unable to tolerate PO intake. Denies any abd pain at present. Pt not currently undergoing chemotherapy.

## 2015-10-04 NOTE — ED Notes (Signed)
FIRST ATTEMPT TO CALL REPORT TO 1340-1.

## 2015-10-04 NOTE — ED Provider Notes (Signed)
CSN: [id number]     Arrival date & time 10/04/15  1128 History   First MD Initiated Contact with Patient 10/04/15 1259     Chief Complaint  Patient presents with  . Emesis     (Consider location/radiation/quality/duration/timing/severity/associated sxs/prior Treatment) Patient is a 82 y.o. female presenting with vomiting.  Emesis Severity:  Moderate Number of daily episodes:  3 times yesterday, once the day before, no emesis today however did not try to eat or drink, did keep small amt of gingerale down Quality:  Stomach contents (at first it seemed like gingerale, then one spot of darker emesiIS) Able to tolerate:  Liquids (TODAY) Progression:  Improving Chronicity:  New Recent urination:  Normal Relieved by:  Antiemetics Associated symptoms: abdominal pain (pain started Thursday after drinking boost, severe cramps, right upper side. pain had improved until exam and then  started hurting again while in ED)   Associated symptoms: no cough, no diarrhea, no fever, no headaches and no sore throat   Risk factors: prior abdominal surgery (son says this is similar to prior SBO)     Past Medical History  Diagnosis Date  . Hyperlipidemia   . Diverticulosis   . Colostomy in place [facility])   . Chronic anticoagulation     patient states chronic anticoagulation stopped 2015   . Fluid collection (edema) in the arms, legs, hands and feet   . Urinary frequency     with burning x 2 days  . Hypertension   . RBBB   . PAT (paroxysmal atrial tachycardia) ([place])   . Iron deficiency anemia   . Gross hematuria   . Cardiac pacemaker in [place]-- PLACEMENT 09-10-2008  . Paroxysmal atrial flutter ([facility])     CARDIOLOGIST--  DR CROITORU  . SSS (sick sinus syndrome) (HCC)     S/P PACEMAKER  . Bladder cancer [facility]) recurrent bladder tumor     secondarty to ovarian cancer--  palliative radiation 09-13-2013 to 10-22-2013  . Malignant neoplasm of ovary ([place]) ONOCOLOGIST--  DR Benay Spice  03-13-2012   w/ rectal mass    dx  primay ovarian cancer , +cytokeratin 7, estrogen receptor and WT-1--  chemotherapy and s/p omentectomy and bilateral oophorectomy03-05-2013  . Coronary artery disease     DR. CROITORU IS PT'S CARDIOLOGIST  . Presence of permanent cardiac pacemaker   . Urinary tract infection   . Nausea 2015    chronic intermittent nausea after chemo   Past Surgical History  Procedure Laterality Date  . Permanent pacemaker insertion  09-10-2008    [place]  . Rib removed  1934    Pleurisy and pneumonia  . Bladder neck suspension  1986  . Colon resection  04/04/2012    Procedure: COLON RESECTION;  Surgeon: Rolm Bookbinder, MD;  Location: [facility];  Service: General;  Laterality: N/A;  laparotomy with small bowel resection for obstruction with colostomy with gastrostomy tube placement.   . Portacath placement  05/04/2012    Procedure: INSERTION PORT-A-CATH;  Surgeon: Rolm Bookbinder, MD;  Location: [facility];  Service: General;  Laterality: Right;  . Laparotomy Bilateral 10/03/2012    Procedure: EXPLORATORY LAPAROTOMY;  Surgeon: Imagene Gurney A. Alycia Rossetti, MD;  Location: [facility];  Service: Gynecology;  Laterality: Bilateral;  WITH BSO, TUMOR DEBULKING  . Colostomy revision N/A 10/03/2012    Procedure: COLOSTOMY REVISION;  Surgeon: Rolm Bookbinder, MD;  [facility];  Service: General;  Laterality: N/A;  COLOSTOMY REVISION, Sigmoid colectomy  . Port-a-cath removal N/A 02/21/2013  Procedure: REMOVAL PORT-A-CATH;  Surgeon: Rolm Bookbinder, MD;  [facility]: [facility];  Service: General;  Laterality: N/A;  . Transurethral resection of bladder tumor with gyrus (turbt-gyrus) N/A 09/05/2013    Procedure: TRANSURETHRAL RESECTION OF BLADDER TUMOR WITH GYRUS (TURBT-GYRUS);  Surgeon: Fredricka Bonine, MD;  [facility];  Service: Urology;  Laterality: N/A;  . Transurethral resection of bladder tumor N/A 06/27/2014    Procedure: TRANSURETHRAL RESECTION OF BLADDER TUMOR (TURBT);  Surgeon: Jorja Loa, MD;  [facility];  Service: Urology;  Laterality: N/A;  . Cystoscopy N/A 06/27/2014    Procedure: CYSTOSCOPY;  Surgeon: Jorja Loa, MD;  [facility];  Service: Urology;  Laterality: N/A;  . Abdominal hysterectomy  1972  . Cardiac electrophysiology mapping and ablation  05-21-2008   dr Caryl Comes    a-flutter  . Electrophysiology study  09-26-2008  dr Caryl Comes  03-27-2012  dr croitoru    cardiac ,  no ablation in 2010/   2013  overdrive pacing atrial flutter  . Transthoracic echocardiogram  07-17-2008      ef >55%,  mild to moderate MR and TR,  mild  sclerotic AV without stenosis,    . Cardiovascular stress test  07-17-2008  dr croitoru    normal myocardial perfusion w/ attenuation artifact in the anterior region of mycardium;  no ischemia or infarct/scar/  low risk scan/  normal LV function and wall motion , ef  83%  . Transurethral resection of bladder tumor N/A 11/18/2014    Procedure: TRANSURETHRAL RESECTION OF BLADDER TUMOR (TURBT);  Surgeon: Franchot Gallo, MD;  Location: [facility];  Service: Urology;  Laterality: N/A;  . Cystoscopy N/A 11/18/2014    Procedure: CYSTOSCOPY;  Surgeon: Franchot Gallo, MD;  Location: [facility];  Service: Urology;  Laterality: N/A;  . Transurethral resection of bladder tumor with gyrus (turbt-gyrus) N/A 09/29/2015    Procedure: TRANSURETHRAL RESECTION OF BLADDER TUMOR WITH GYRUS (TURBT-GYRUS);  Surgeon: Franchot Gallo, MD;  [facility];  Service: Urology;  Laterality: N/A;   Family History  Problem Relation Age of Onset  . Heart disease Mother    Social History  Substance Use Topics  . Smoking status: Never Smoker   . Smokeless tobacco: Never Used  . Alcohol Use: No   OB History    Gravida Para Term Preterm AB TAB SAB Ectopic Multiple Living   2 2              Obstetric Comments   1st menses age 50, 1st pregnancy age 37     Review of Systems  Constitutional: Positive for activity  change, appetite change and fatigue. Negative for fever.  HENT: Negative for sore throat.   Eyes: Negative for visual disturbance.  Respiratory: Negative for cough and shortness of breath.   Cardiovascular: Negative for chest pain.  Gastrointestinal: Positive for nausea, vomiting, abdominal pain (pain started Thursday after drinking boost, severe cramps, right upper side. pain had improved until exam and then  started hurting again while in ED), constipation (decreased ostomy output until Thursday, then had output and more yesterday, then today very little again. tiny bit of fluid this AM,  passing flatus into ostomy) and abdominal distention. Negative for diarrhea.  Genitourinary: Positive for dysuria and hematuria. Negative for flank pain and difficulty urinating.  Musculoskeletal: Negative for back pain and neck pain.  Skin: Negative for rash.  Neurological: Negative for syncope and headaches.      Allergies  Pindolol; Iodine;  Shellfish allergy; Vibramycin; Doxycycline; Levofloxacin; Multaq; and Vicodin  Home Medications   Prior to Admission medications   Medication Sig Start Date End Date Taking? Authorizing Provider  Carboxymethylcell-Hypromellose (GENTEAL OP) Apply 1 drop to eye 2 (two) times daily as needed (Dry eyes).   Yes Historical Provider, MD  cephALEXin (KEFLEX) 500 MG capsule Take 1 capsule (500 mg total) by mouth 2 (two) times daily. 09/30/15  Yes Franchot Gallo, MD  diltiazem (CARDIZEM CD) 360 MG 24 hr capsule Take 1 capsule (360 mg total) by mouth daily. Patient taking differently: Take 360 mg by mouth every morning.  09/24/14  Yes Mihai Croitoru, MD  docusate sodium (COLACE) 100 MG capsule Take 100 mg by mouth daily as needed for mild constipation.   Yes Historical Provider, MD  furosemide (LASIX) 20 MG tablet Take 1 tablet (20 mg total) by mouth daily as needed. Patient taking differently: Take 20 mg by mouth daily as needed for fluid.  07/01/15  Yes Mihai Croitoru, MD   iron polysaccharides (NIFEREX) 150 MG capsule Take 1 capsule (150 mg total) by mouth daily. Patient taking differently: Take 150 mg by mouth every evening.  09/07/13  Yes Leanna Battles, MD  lactose free nutrition (BOOST) LIQD Take 237 mLs by mouth 2 (two) times a week.    Yes Historical Provider, MD  polyethylene glycol (MIRALAX / GLYCOLAX) packet Take 17 g by mouth daily as needed for mild constipation or moderate constipation.   Yes Historical Provider, MD  potassium chloride (K-DUR) 10 MEQ tablet Take 1 tablet (10 mEq total) by mouth daily. Patient taking differently: Take 10 mEq by mouth daily as needed (Fluid).  06/09/15  Yes Mihai Croitoru, MD   BP 153/90 mmHg  Pulse 77  Temp(Src) 97.7 F (36.5 C) (Oral)  Resp 18  SpO2 95% Physical Exam  Constitutional: She is oriented to person, place, and time. She appears well-developed and well-nourished. No distress.  HENT:  Head: Normocephalic and atraumatic.  Eyes: Conjunctivae and EOM are normal.  Neck: Normal range of motion.  Cardiovascular: Normal rate, regular rhythm, normal heart sounds and intact distal pulses.  Exam reveals no gallop and no friction rub.   No murmur heard. Pulmonary/Chest: Effort normal and breath sounds normal. No respiratory distress. She has no wheezes. She has no rales.  Abdominal: Soft. She exhibits distension. There is tenderness (diffuse however worse in RUQ) in the right upper quadrant. There is no guarding and no CVA tenderness. A hernia is present. Hernia confirmed positive in the ventral area (reducible ).  Musculoskeletal: She exhibits no edema or tenderness.  Neurological: She is alert and oriented to person, place, and time.  Skin: Skin is warm and dry. No rash noted. She is not diaphoretic. No erythema.  Nursing note and vitals reviewed.   ED Course  Procedures (including critical care time) Labs Review Labs Reviewed  COMPREHENSIVE METABOLIC PANEL - Abnormal; Notable for the following:     Sodium 134 (*)    Chloride 96 (*)    Glucose, Bld 127 (*)    Creatinine, Ser 1.04 (*)    Total Protein 6.4 (*)    GFR calc non Af Amer 47 (*)    GFR calc Af Amer 55 (*)    All other components within normal limits  URINALYSIS, ROUTINE W REFLEX MICROSCOPIC (NOT AT [facility]) - Abnormal; Notable for the following:    Color, Urine AMBER (*)    APPearance TURBID (*)    Hgb urine dipstick LARGE (*)  Bilirubin Urine SMALL (*)    Protein, ur >300 (*)    Leukocytes, UA MODERATE (*)    All other components within normal limits  URINE MICROSCOPIC-ADD ON - Abnormal; Notable for the following:    Squamous Epithelial / LPF 0-5 (*)    Bacteria, UA MANY (*)    All other components within normal limits  LIPASE, BLOOD  CBC    Imaging Review No results found. I have personally reviewed and evaluated these images and lab results as part of my medical decision-making.   EKG Interpretation None      MDM   Final diagnoses:  Generalized abdominal pain  Non-intractable vomiting with nausea, vomiting of unspecified type  Urinary tract infection with hematuria, site unspecified   82 year old female with a history of ovarian carcinoma with extension of tumor into the bladder, colon resection in 2013 by Dr. Donne Hazel with colostomy and revision in 2014, hyperlipidemia, hypertension, pacemaker for sick sinus syndrome/paroxysmal atrial flutter, coronary artery disease, recent transurethral resection of bladder tumor by Dr. Diona Fanti on Monday for palliative management of tumor extension into the bladder presents with concern nausea, vomiting, and abdominal pain.  Patient with abdominal distention, decreased output today from ostomy, nausea, and vomiting concerning for possible post-op ileus or obstruction.  Patient also with RUQ abdominal pain, and cannot rule out cholecystitis.  CT abdomen ordered and pending.  In addition, patient has had UTI, unable to keep down abx and has not taken for last few days.  Given rocephin IV.   Plan to follow up on CT and reevaluate patient.  Labs overall near baseline.  VS WNL. No emesis so far in the ED.   Gareth Morgan, MD 10/04/15 [phone]

## 2015-10-05 ENCOUNTER — Inpatient Hospital Stay (HOSPITAL_COMMUNITY): Payer: Medicare Other

## 2015-10-05 ENCOUNTER — Encounter (HOSPITAL_COMMUNITY): Payer: Self-pay

## 2015-10-05 LAB — COMPREHENSIVE METABOLIC PANEL
ALK PHOS: 77 U/L (ref 38–126)
ALT: 11 U/L — ABNORMAL LOW (ref 14–54)
ANION GAP: 9 (ref 5–15)
AST: 15 U/L (ref 15–41)
Albumin: 3 g/dL — ABNORMAL LOW (ref 3.5–5.0)
BUN: 16 mg/dL (ref 6–20)
CALCIUM: 8.3 mg/dL — AB (ref 8.9–10.3)
CHLORIDE: 100 mmol/L — AB (ref 101–111)
CO2: 26 mmol/L (ref 22–32)
Creatinine, Ser: 0.82 mg/dL (ref 0.44–1.00)
Glucose, Bld: 101 mg/dL — ABNORMAL HIGH (ref 65–99)
Potassium: 4.3 mmol/L (ref 3.5–5.1)
SODIUM: 135 mmol/L (ref 135–145)
Total Bilirubin: 0.7 mg/dL (ref 0.3–1.2)
Total Protein: 5.4 g/dL — ABNORMAL LOW (ref 6.5–8.1)

## 2015-10-05 LAB — MRSA PCR SCREENING: MRSA BY PCR: NEGATIVE

## 2015-10-05 LAB — TSH: TSH: 1.986 u[IU]/mL (ref 0.350–4.500)

## 2015-10-05 LAB — CBC
HCT: 38.6 % (ref 36.0–46.0)
HEMOGLOBIN: 12.6 g/dL (ref 12.0–15.0)
MCH: 31 pg (ref 26.0–34.0)
MCHC: 32.6 g/dL (ref 30.0–36.0)
MCV: 95.1 fL (ref 78.0–100.0)
Platelets: 237 10*3/uL (ref 150–400)
RBC: 4.06 MIL/uL (ref 3.87–5.11)
RDW: 14.5 % (ref 11.5–15.5)
WBC: 3.1 10*3/uL — AB (ref 4.0–10.5)

## 2015-10-05 LAB — MAGNESIUM: MAGNESIUM: 1.8 mg/dL (ref 1.7–2.4)

## 2015-10-05 LAB — PHOSPHORUS: Phosphorus: 3.8 mg/dL (ref 2.5–4.6)

## 2015-10-05 MED ORDER — DILTIAZEM LOAD VIA INFUSION
10.0000 mg | Freq: Once | INTRAVENOUS | Status: AC
  Filled 2015-10-05: qty 10

## 2015-10-05 MED ORDER — SODIUM CHLORIDE 0.9 % IV SOLN
INTRAVENOUS | Status: DC
  Administered 2015-10-05: 20:00:00 via INTRAVENOUS
  Administered 2015-10-06: 75 mL/h via INTRAVENOUS

## 2015-10-05 MED ORDER — DIGOXIN 0.25 MG/ML IJ SOLN
0.2500 mg | Freq: Once | INTRAMUSCULAR | Status: AC
  Administered 2015-10-06: 0.25 mg via INTRAVENOUS
  Filled 2015-10-05: qty 1

## 2015-10-05 MED ORDER — DILTIAZEM HCL 25 MG/5ML IV SOLN
10.0000 mg | Freq: Once | INTRAVENOUS | Status: DC
  Filled 2015-10-05: qty 5

## 2015-10-05 MED ORDER — DIGOXIN 0.25 MG/ML IJ SOLN
0.2500 mg | Freq: Once | INTRAMUSCULAR | Status: AC
  Administered 2015-10-05: 0.25 mg via INTRAVENOUS
  Filled 2015-10-05: qty 1

## 2015-10-05 MED ORDER — DIGOXIN 0.25 MG/ML IJ SOLN
0.1250 mg | Freq: Every day | INTRAMUSCULAR | Status: DC
  Administered 2015-10-07 – 2015-10-12 (×6): 0.125 mg via INTRAVENOUS
  Filled 2015-10-05 (×7): qty 0.5

## 2015-10-05 MED ORDER — SODIUM CHLORIDE 0.9 % IV BOLUS (SEPSIS)
500.0000 mL | Freq: Once | INTRAVENOUS | Status: AC
  Administered 2015-10-05: 500 mL via INTRAVENOUS

## 2015-10-05 MED ORDER — DILTIAZEM HCL 100 MG IV SOLR
5.0000 mg/h | INTRAVENOUS | Status: DC
  Administered 2015-10-05: 5 mg/h via INTRAVENOUS
  Filled 2015-10-05 (×2): qty 100

## 2015-10-05 MED ORDER — ENOXAPARIN SODIUM 40 MG/0.4ML ~~LOC~~ SOLN
40.0000 mg | SUBCUTANEOUS | Status: DC
  Administered 2015-10-05 – 2015-10-11 (×7): 40 mg via SUBCUTANEOUS
  Filled 2015-10-05 (×7): qty 0.4

## 2015-10-05 MED ORDER — DILTIAZEM LOAD VIA INFUSION
10.0000 mg | Freq: Once | INTRAVENOUS | Status: AC
  Administered 2015-10-05: 10 mg via INTRAVENOUS

## 2015-10-05 MED ORDER — DILTIAZEM HCL 25 MG/5ML IV SOLN
10.0000 mg | Freq: Once | INTRAVENOUS | Status: AC
  Administered 2015-10-05: 10 mg via INTRAVENOUS
  Filled 2015-10-05: qty 5

## 2015-10-05 NOTE — Progress Notes (Signed)
PROGRESS NOTE    Victoria [name]  [id number]  DOB: 04-04-1927  DOA: 10/04/2015 PCP: Donnajean Lopes, MD Outpatient Specialists: Medical oncology: Dr. Wilder Glade course: 82 year old female patient with history of ovarian cancer, status post chemotherapy with Taxol/carboplatin in 2014, s/p TURBT and fulguration of high-grade carcinoma & s/p palliative XRT, s/p TUR of a high-grade poorly differentiated carcinoma right dome of the bladder, colostomy, paroxysmal atrial flutter off anticoagulation, SSS  S/p PPM, anemia, HLD, HTN, recent hospitalization 09/29/15-10/03/15 for resection of bladder tumor, presented to ED with nausea and vomiting of 2 days' duration and admitted for SBO. General surgery consulted. Treated conservatively with bowel rest and NG tube decompression.  Assessment & Plan:   SBO - May be from ? Internal hernia or volvulus. - Gen. surgery consulting and patient being managed conservatively with bowel rest, NG tube decompression, IV fluids, serial exams and x-rays. - Improving. Starting to put out stuff through the colostomy.  Dehydration - Secondary to GI losses. IV fluids.  Paroxysmal atrial flutter - Rate controlled while off of Cardizem secondary to nausea and vomiting. Monitor. Resume Cardizem when able. Has been off of  Essential hypertension - Mildly uncontrolled. Monitor for now and consider IV antihypertensives if consistently and markedly elevated.  Ovarian cancer - Outpatient follow-up with oncology  Possible UTI - Continue IV Rocephin  Bladder Cancer - recent surgery  DVT prophylaxis: Lovenox Code Status: DO NOT RESUSCITATE Family Communication: None at bedside Disposition Plan: DC home in approximately 48 hours.   Consultants:  General surgery  Procedures:  NG tube decompression  Antimicrobials:  IV Rocephin 3/11 >   Subjective: Feels much better. Denies abdominal pain. Passing stools through colostomy. As per  RN, no acute issues.  Objective: Filed Vitals:   10/04/15 2000 10/04/15 2030 10/04/15 2105 10/05/15 0513  BP: 149/85 147/79 158/82 129/63  Pulse: 73 73 80 82  Temp: 97.9 F (36.6 C)  97.6 F (36.4 C) 97.6 F (36.4 C)  TempSrc: Oral  Oral Oral  Resp: 15 16 16 16   Height:   5' (1.524 m)   Weight:   60.782 kg (134 lb)   SpO2: 96% 97% 96% 94%    Intake/Output Summary (Last 24 hours) at 10/05/15 1351 Last data filed at 10/05/15 1259  Gross per 24 hour  Intake      0 ml  Output   1460 ml  Net  -1460 ml   Filed Weights   10/04/15 2105  Weight: 60.782 kg (134 lb)    Exam:  General exam: Pleasant elderly female lying comfortably supine in bed. Respiratory system: Clear. No increased work of breathing. Cardiovascular system: S1 & S2 heard, RRR. No JVD, murmurs, gallops, clicks or pedal edema. Gastrointestinal system: Abdomen is nondistended, soft and nontender. Normal bowel sounds heard. Colostomy draining moderate amount of liquid green stools. NG tube in place connected to intermittent wall suction. Central nervous system: Alert and oriented. No focal neurological deficits. Extremities: Symmetric 5 x 5 power.   Data Reviewed: Basic Metabolic Panel:  Recent Labs Lab 09/30/15 1119 10/04/15 1222 10/05/15 0453  NA 139 134* 135  K 4.0 4.6 4.3  CL 105 96* 100*  CO2 27 28 26   GLUCOSE 102* 127* 101*  BUN 8 17 16   CREATININE 0.81 1.04* 0.82  CALCIUM 8.7* 9.1 8.3*  MG  --   --  1.8  PHOS  --   --  3.8   Liver Function Tests:  Recent Labs Lab  10/04/15 1222 10/05/15 0453  AST 17 15  ALT 14 11*  ALKPHOS 87 77  BILITOT 0.6 0.7  PROT 6.4* 5.4*  ALBUMIN 3.5 3.0*    Recent Labs Lab 10/04/15 1222  LIPASE 22   No results for input(s): AMMONIA in the last 168 hours. CBC:  Recent Labs Lab 10/04/15 1222 10/05/15 0453  WBC 6.4 3.1*  HGB 13.5 12.6  HCT 39.9 38.6  MCV 91.7 95.1  PLT 245 237   Cardiac Enzymes: No results for input(s): CKTOTAL, CKMB,  CKMBINDEX, TROPONINI in the last 168 hours. BNP (last 3 results) No results for input(s): PROBNP in the last 8760 hours. CBG: No results for input(s): GLUCAP in the last 168 hours.  No results found for this or any previous visit (from the past 240 hour(s)).       Studies: Ct Abdomen Pelvis W Contrast  10/04/2015  CLINICAL DATA:  History of bladder cancer, bladder tumor removed on Monday. Abdominal pain, vomiting. EXAM: CT ABDOMEN AND PELVIS WITH CONTRAST TECHNIQUE: Multidetector CT imaging of the abdomen and pelvis was performed using the standard protocol following bolus administration of intravenous contrast. CONTRAST:  50mL OMNIPAQUE IOHEXOL 300 MG/ML SOLN, 58mL OMNIPAQUE IOHEXOL 300 MG/ML SOLN COMPARISON:  08/31/2013 FINDINGS: Lower chest: There is cardiomegaly. Pacer wires noted in the right heart. Coronary artery calcifications present. Bilateral lower lobe atelectasis. No pleural effusions. Hepatobiliary: Unremarkable appearance Pancreas: Atrophic.  No mass appear Spleen: Unremarkable appearance Adrenals/Urinary Tract: Areas of scarring in the kidneys bilaterally. Small cysts in the right kidney. No hydronephrosis. Adrenal glands are unremarkable. Bladder is decompressed. Within the right lower pelvis, there is a 5.2 x 4.9 cm rim enhancing mass with central low-density and gas. This likely reflects a residual tumor. Stomach/Bowel: Patient is status post left lower quadrant colostomy. Extensive diverticulosis throughout the colon. There is small bowel dilatation throughout the abdomen and into the pelvis. Distal small bowel is decompressed. In addition, there is swirled appearance of the mesenteric vessels centrally. This is concerning for internal hernia or low less likely volvulus as the cause of the obstruction. There is moderate free fluid in the pelvis. Small amount of free fluid around the spleen and liver. Free fluid also noted within the left lower quadrant ostomy site.  Vascular/Lymphatic: Aorta is heavily calcified, non aneurysmal. No adenopathy. Reproductive: Prior hysterectomy.  No adnexal masses. Other: No free air Musculoskeletal: Diffuse degenerative changes throughout the lumbar spine. IMPRESSION: Small bowel obstruction. There is a swirled appearance of the central mesenteric vessels suggesting the cause of the obstruction is internal hernia or less likely volvulus. Associated free fluid in the abdomen and pelvis. Left lower quadrant ostomy with extensive diverticular disease throughout the decompressed colon. Peripherally enhancing mass within the lower right pelvis, likely residual bladder wall tumor. Cardiomegaly, coronary artery disease. Bibasilar atelectasis. Electronically Signed   By: Rolm Baptise M.D.   On: 10/04/2015 17:05   Dg Abd 2 Views  10/05/2015  CLINICAL DATA:  Small bowel obstruction EXAM: ABDOMEN - 2 VIEW COMPARISON:  CT 10/04/2015 FINDINGS: NG tube is in the stomach. Continued dilated small bowel loops with air-fluid levels, likely not significantly changed when compared to the scout film on prior CT. No free air organomegaly. IMPRESSION: Continued small bowel obstruction pattern.  No real change. Electronically Signed   By: Rolm Baptise M.D.   On: 10/05/2015 10:18        Scheduled Meds: . cefTRIAXone (ROCEPHIN)  IV  1 g Intravenous Q24H  . 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    Vernell Leep, MD, FACP, FHM. [facility] Pager [phone]  If 7PM-7AM, please contact  night-coverage [email or web address] Password [email or web address] 10/05/2015, 1:51 PM    LOS: 1 day

## 2015-10-05 NOTE — Progress Notes (Signed)
Subjective: Feels much better today. Has had some output from the ostomy  Objective: Vital signs in last 24 hours: Temp:  [97.4 F (36.3 C)-97.9 F (36.6 C)] 97.6 F (36.4 C) (03/12 0513) Pulse Rate:  [70-82] 82 (03/12 0513) Resp:  [15-19] 16 (03/12 0513) BP: (129-158)/(63-90) 129/63 mmHg (03/12 0513) SpO2:  [94 %-97 %] 94 % (03/12 0513) Weight:  [60.782 kg (134 lb)] 60.782 kg (134 lb) (03/11 2105)    Intake/Output from previous day: 03/11 0701 - 03/12 0700 In: -  Out: 850 [Urine:100; Emesis/NG output:750] Intake/Output this shift: Total I/O In: -  Out: 310 [Urine:100; Stool:210]  Resp: clear to auscultation bilaterally Cardio: regular rate and rhythm GI: soft, less distended. ostomy pink and productive  Lab Results:   Recent Labs  10/04/15 1222 10/05/15 0453  WBC 6.4 3.1*  HGB 13.5 12.6  HCT 39.9 38.6  PLT 245 237   BMET  Recent Labs  10/04/15 1222 10/05/15 0453  NA 134* 135  K 4.6 4.3  CL 96* 100*  CO2 28 26  GLUCOSE 127* 101*  BUN 17 16  CREATININE 1.04* 0.82  CALCIUM 9.1 8.3*   PT/INR No results for input(s): LABPROT, INR in the last 72 hours. ABG No results for input(s): PHART, HCO3 in the last 72 hours.  Invalid input(s): PCO2, PO2  Studies/Results: Ct Abdomen Pelvis W Contrast  10/04/2015  CLINICAL DATA:  History of bladder cancer, bladder tumor removed on Monday. Abdominal pain, vomiting. EXAM: CT ABDOMEN AND PELVIS WITH CONTRAST TECHNIQUE: Multidetector CT imaging of the abdomen and pelvis was performed using the standard protocol following bolus administration of intravenous contrast. CONTRAST:  9mL OMNIPAQUE IOHEXOL 300 MG/ML SOLN, 27mL OMNIPAQUE IOHEXOL 300 MG/ML SOLN COMPARISON:  08/31/2013 FINDINGS: Lower chest: There is cardiomegaly. Pacer wires noted in the right heart. Coronary artery calcifications present. Bilateral lower lobe atelectasis. No pleural effusions. Hepatobiliary: Unremarkable appearance Pancreas: Atrophic.  No mass  appear Spleen: Unremarkable appearance Adrenals/Urinary Tract: Areas of scarring in the kidneys bilaterally. Small cysts in the right kidney. No hydronephrosis. Adrenal glands are unremarkable. Bladder is decompressed. Within the right lower pelvis, there is a 5.2 x 4.9 cm rim enhancing mass with central low-density and gas. This likely reflects a residual tumor. Stomach/Bowel: Patient is status post left lower quadrant colostomy. Extensive diverticulosis throughout the colon. There is small bowel dilatation throughout the abdomen and into the pelvis. Distal small bowel is decompressed. In addition, there is swirled appearance of the mesenteric vessels centrally. This is concerning for internal hernia or low less likely volvulus as the cause of the obstruction. There is moderate free fluid in the pelvis. Small amount of free fluid around the spleen and liver. Free fluid also noted within the left lower quadrant ostomy site. Vascular/Lymphatic: Aorta is heavily calcified, non aneurysmal. No adenopathy. Reproductive: Prior hysterectomy.  No adnexal masses. Other: No free air Musculoskeletal: Diffuse degenerative changes throughout the lumbar spine. IMPRESSION: Small bowel obstruction. There is a swirled appearance of the central mesenteric vessels suggesting the cause of the obstruction is internal hernia or less likely volvulus. Associated free fluid in the abdomen and pelvis. Left lower quadrant ostomy with extensive diverticular disease throughout the decompressed colon. Peripherally enhancing mass within the lower right pelvis, likely residual bladder wall tumor. Cardiomegaly, coronary artery disease. Bibasilar atelectasis. Electronically Signed   By: Rolm Baptise M.D.   On: 10/04/2015 17:05    Anti-infectives: Anti-infectives    Start     Dose/Rate Route Frequency Ordered Stop  10/05/15 1500  cefTRIAXone (ROCEPHIN) 1 g in dextrose 5 % 50 mL IVPB     1 g 100 mL/hr over 30 Minutes Intravenous Every 24  hours 10/04/15 2157     10/04/15 1445  cefTRIAXone (ROCEPHIN) 1 g in dextrose 5 % 50 mL IVPB     1 g 100 mL/hr over 30 Minutes Intravenous  Once 10/04/15 1433 10/04/15 1625      Assessment/Plan: s/p * No surgery found * continue ng and bowel rest  Repeat abd xrays tomorrow  LOS: 1 day    TOTH III,Lerin Jech S 10/05/2015

## 2015-10-05 NOTE — Progress Notes (Addendum)
Addendum  Paged by RN indicating patient complaining of intermittent dizziness, lightheadedness and feeling hot. Denies chest pain or dyspnea.   Arrived at bedside. Patient lying comfortably propped up in bed. States she has h/o intermittent palpitations from "Afib". Above symptoms resolved. CVS: HR 160-170's, BP 112/91. No JVD or pedal edema. RS: clear. No increased WOB. EKG: Likely Aflutter with 1:1 conduction, RBBB (old)  Assessment/Plan: Patient with h/o Paroxysmal A Flutter and has not taken Cardizem since 3/9 d/t SBO symptoms. Now with Aflutter/fib with RVR Transfer to SDU for close monitoring and Mx Start Cardizem drip with bolus Start PO Cardizem when SBO resolved. Coumadin DC'ed ~ 2 years ago d/t Cancer related hematuria. Discussed with RN. Discussed with daughter - in- law and granddaughter at bedside.  Vernell Leep, MD, FACP, FHM. [facility] Pager [phone]  If 7PM-7AM, please contact night-coverage [email or web address] Password [facility] 10/05/2015, 5:01 PM

## 2015-10-06 ENCOUNTER — Inpatient Hospital Stay (HOSPITAL_COMMUNITY): Payer: Medicare Other

## 2015-10-06 ENCOUNTER — Encounter (HOSPITAL_COMMUNITY): Payer: Self-pay | Admitting: Cardiology

## 2015-10-06 DIAGNOSIS — I4892 Unspecified atrial flutter: Secondary | ICD-10-CM | POA: Diagnosis not present

## 2015-10-06 DIAGNOSIS — I4510 Unspecified right bundle-branch block: Secondary | ICD-10-CM | POA: Diagnosis present

## 2015-10-06 DIAGNOSIS — K5669 Other intestinal obstruction: Secondary | ICD-10-CM

## 2015-10-06 HISTORY — DX: Unspecified atrial flutter: I48.92

## 2015-10-06 LAB — CBC
HCT: 35.5 % — ABNORMAL LOW (ref 36.0–46.0)
Hemoglobin: 11.9 g/dL — ABNORMAL LOW (ref 12.0–15.0)
MCH: 31.2 pg (ref 26.0–34.0)
MCHC: 33.5 g/dL (ref 30.0–36.0)
MCV: 93.2 fL (ref 78.0–100.0)
Platelets: 193 10*3/uL (ref 150–400)
RBC: 3.81 MIL/uL — ABNORMAL LOW (ref 3.87–5.11)
RDW: 14.7 % (ref 11.5–15.5)
WBC: 4 10*3/uL (ref 4.0–10.5)

## 2015-10-06 MED ORDER — DILTIAZEM HCL 100 MG IV SOLR
5.0000 mg/h | INTRAVENOUS | Status: AC
  Administered 2015-10-06 – 2015-10-07 (×2): 5 mg/h via INTRAVENOUS
  Filled 2015-10-06 (×2): qty 100

## 2015-10-06 MED ORDER — DILTIAZEM HCL 100 MG IV SOLR
5.0000 mg/h | INTRAVENOUS | Status: DC
  Administered 2015-10-06: 5 mg/h via INTRAVENOUS

## 2015-10-06 MED ORDER — SODIUM CHLORIDE 0.9 % IV SOLN
INTRAVENOUS | Status: DC
  Administered 2015-10-06 – 2015-10-07 (×2): via INTRAVENOUS

## 2015-10-06 NOTE — Progress Notes (Signed)
[facility] Progress Note     Subjective: Pt doing well.  No N/V, has no abdominal pain and feels less distended.  She's passing good flatus and lots of liquid brown stool from her ostomy.  She says her ostomy had to be emptied overnight.  She can't wait to get her NG out.  Thirsty/hungry.    Objective: Vital signs in last 24 hours: Temp:  [97.4 F (36.3 C)-99.1 F (37.3 C)] 98.2 F (36.8 C) (03/13 0800) Pulse Rate:  [75-170] 150 (03/12 1730) Resp:  [15-30] 15 (03/13 0600) BP: (70-133)/(36-91) 120/65 mmHg (03/13 0600) SpO2:  [89 %-96 %] 94 % (03/13 0600) Weight:  [61.8 kg (136 lb 3.9 oz)] 61.8 kg (136 lb 3.9 oz) (03/13 0640) Last BM Date: 10/05/15 (has a colostomy)   Intake/Output from previous day: 03/12 0701 - 03/13 0700 In: 1250 [I.V.:750; IV Piggyback:500] Out: 2386 [Urine:650; Emesis/NG output:650; Stool:1086] Intake/Output this shift: Total I/O In: -  Out: 75 [Urine:75]  PE: Gen:  Alert, NAD, pleasant Card:  RRR, no M/G/R heard Pulm:  CTA, no W/R/R Abd: Soft, NT/ND, +BS, no HSM, ostomy patent with lots of flatus/brown liquid stools in bag Ext:  No erythema, edema, or tenderness   Lab Results:   Recent Labs  10/05/15 0453 10/06/15 0321  WBC 3.1* 4.0  HGB 12.6 11.9*  HCT 38.6 35.5*  PLT 237 193   BMET  Recent Labs  10/04/15 1222 10/05/15 0453  NA 134* 135  K 4.6 4.3  CL 96* 100*  CO2 28 26  GLUCOSE 127* 101*  BUN 17 16  CREATININE 1.04* 0.82  CALCIUM 9.1 8.3*   PT/INR No results for input(s): LABPROT, INR in the last 72 hours. CMP     Component Value Date/Time   NA 135 10/05/2015 0453   NA 135* 11/21/2012 0920   K 4.3 10/05/2015 0453   K 4.3 11/21/2012 0920   CL 100* 10/05/2015 0453   CL 103 11/21/2012 0920   CO2 26 10/05/2015 0453   CO2 25 11/21/2012 0920   GLUCOSE 101* 10/05/2015 0453   GLUCOSE 101* 11/21/2012 0920   BUN 16 10/05/2015 0453   BUN 13.3 11/21/2012 0920   CREATININE 0.82 10/05/2015 0453   CREATININE 1.02*  06/26/2015 1137   CREATININE 0.8 11/21/2012 0920   CALCIUM 8.3* 10/05/2015 0453   CALCIUM 8.7 11/21/2012 0920   PROT 5.4* 10/05/2015 0453   PROT 6.7 10/24/2012 0840   ALBUMIN 3.0* 10/05/2015 0453   ALBUMIN 3.3* 10/24/2012 0840   AST 15 10/05/2015 0453   AST 17 10/24/2012 0840   ALT 11* 10/05/2015 0453   ALT 8 10/24/2012 0840   ALKPHOS 77 10/05/2015 0453   ALKPHOS 89 10/24/2012 0840   BILITOT 0.7 10/05/2015 0453   BILITOT 0.29 10/24/2012 0840   GFRNONAA >60 10/05/2015 0453   GFRAA >60 10/05/2015 0453   Lipase     Component Value Date/Time   LIPASE 22 10/04/2015 1222       Studies/Results: Ct Abdomen Pelvis W Contrast  10/04/2015  CLINICAL DATA:  History of bladder cancer, bladder tumor removed on Monday. Abdominal pain, vomiting. EXAM: CT ABDOMEN AND PELVIS WITH CONTRAST TECHNIQUE: Multidetector CT imaging of the abdomen and pelvis was performed using the standard protocol following bolus administration of intravenous contrast. CONTRAST:  64mL OMNIPAQUE IOHEXOL 300 MG/ML SOLN, 52mL OMNIPAQUE IOHEXOL 300 MG/ML SOLN COMPARISON:  08/31/2013 FINDINGS: Lower chest: There is cardiomegaly. Pacer wires noted in the right heart. Coronary artery calcifications present. Bilateral lower  lobe atelectasis. No pleural effusions. Hepatobiliary: Unremarkable appearance Pancreas: Atrophic.  No mass appear Spleen: Unremarkable appearance Adrenals/Urinary Tract: Areas of scarring in the kidneys bilaterally. Small cysts in the right kidney. No hydronephrosis. Adrenal glands are unremarkable. Bladder is decompressed. Within the right lower pelvis, there is a 5.2 x 4.9 cm rim enhancing mass with central low-density and gas. This likely reflects a residual tumor. Stomach/Bowel: Patient is status post left lower quadrant colostomy. Extensive diverticulosis throughout the colon. There is small bowel dilatation throughout the abdomen and into the pelvis. Distal small bowel is decompressed. In addition, there is  swirled appearance of the mesenteric vessels centrally. This is concerning for internal hernia or low less likely volvulus as the cause of the obstruction. There is moderate free fluid in the pelvis. Small amount of free fluid around the spleen and liver. Free fluid also noted within the left lower quadrant ostomy site. Vascular/Lymphatic: Aorta is heavily calcified, non aneurysmal. No adenopathy. Reproductive: Prior hysterectomy.  No adnexal masses. Other: No free air Musculoskeletal: Diffuse degenerative changes throughout the lumbar spine. IMPRESSION: Small bowel obstruction. There is a swirled appearance of the central mesenteric vessels suggesting the cause of the obstruction is internal hernia or less likely volvulus. Associated free fluid in the abdomen and pelvis. Left lower quadrant ostomy with extensive diverticular disease throughout the decompressed colon. Peripherally enhancing mass within the lower right pelvis, likely residual bladder wall tumor. Cardiomegaly, coronary artery disease. Bibasilar atelectasis. Electronically Signed   By: Rolm Baptise M.D.   On: 10/04/2015 17:05   Dg Abd 2 Views  10/05/2015  CLINICAL DATA:  Small bowel obstruction EXAM: ABDOMEN - 2 VIEW COMPARISON:  CT 10/04/2015 FINDINGS: NG tube is in the stomach. Continued dilated small bowel loops with air-fluid levels, likely not significantly changed when compared to the scout film on prior CT. No free air organomegaly. IMPRESSION: Continued small bowel obstruction pattern.  No real change. Electronically Signed   By: Rolm Baptise M.D.   On: 10/05/2015 10:18    Anti-infectives: Anti-infectives    Start     Dose/Rate Route Frequency Ordered Stop   10/05/15 1500  cefTRIAXone (ROCEPHIN) 1 g in dextrose 5 % 50 mL IVPB     1 g 100 mL/hr over 30 Minutes Intravenous Every 24 hours 10/04/15 2157     10/04/15 1445  cefTRIAXone (ROCEPHIN) 1 g in dextrose 5 % 50 mL IVPB     1 g 100 mL/hr over 30 Minutes Intravenous  Once 10/04/15  1433 10/04/15 1625       Assessment/Plan H/o ovarian cancer s/p diverting colostomy Dr. Donne Hazel 3 years ago S/p transurethral resection of bladder tumor (TURBT) 09/29/15 Dr. Beatrix Fetters SBO HD #3 -Repeat KUB shows non-obstructive bowel gas pattern -Clamp NG for 6 hours and if she tolerates this then may be able to d/c NG tube. -Allow sips/chips during clamping trial -IVF, pain control, antiemetics -Ambulate and IS -SCD's and lovenox -Can come out of the unit from our perspective when cards okay with her on the floor     LOS: 2 days    Nat Christen 10/06/2015, 8:36 AM Pager: [phone]  (7am - 4:30pm M-F; 7am - 11:30am Sa/Su)

## 2015-10-06 NOTE — Progress Notes (Signed)
PROGRESS NOTE    Victoria [name]  [id number]  DOB: [birth year]  DOA: 10/04/2015 PCP: Donnajean Lopes, MD Outpatient Specialists: Medical oncology: Dr. Ladell Pier Cardiology: Dr. 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 General surgery consulted. Treated conservatively with bowel rest and NG tube decompression. SBO resolved & surgery planning to advance diet. Transfer to SPU on 3/12 due to rapid A. fib/flutter-reverted to sinus rhythm after digoxin load. Cardiology consulted.  Assessment & Plan:   Paroxysmal atrial flutter/fib with RVR - Patient is on Cardizem CD 360 mg daily at home which she was unable to tolerate since 3/9 due to SBO symptoms. - On 10/05/15 afternoon, she went into symptomatic atrial flutter with RVR & 2:1 block. She was transferred to stepdown unit. Attempted to start Cardizem drip but was hypotensive (may even have been rate related) and hence not continued. Case was discussed with cardiologist on-call and started on digoxin load. - Patient reverted to sinus rhythm at approximately 4 AM on 3/13. Cardiology consulted for further management-? Resume Cardizem CD when allowed to take by mouth by surgery & DC digoxin. - CHADs VASc=4). Not candidate for anticoagulation secondary to hematuria from ovarian cancer with bladder matter stasis. Has been off of Coumadin for approximately 2 years.  SBO - May be from ? Internal hernia or volvulus. - Gen. surgery consulted and  patient managed conservatively with bowel rest, NG tube decompression, IV fluids, serial exams and x-rays. - SBO seems to have clinically and radiologically resolved. Surgery plan NGT clamping trial and then may advance diet.  Dehydration - Secondary to GI losses. IV fluids. Resolved. Minimize IV fluids to discontinue when able to take orally.  Essential hypertension/hypotension - Briefly developed hypotension overnight 3/12, may be rate related from A. fib with RVR. Resolved. Monitor  Ovarian cancer - Outpatient follow-up with oncology  Possible UTI - Patient had completed course of Keflex after recent bladder surgery. Urine microscopy on admission showed many bacteria and WBCs and RBCs. She was empirically started on IV Rocephin but urine cultures were never sent. No dysuria or urinary frequency. Hematuria improved. Consider discontinuing Rocephin.  Bladder Cancer - recent surgery  Anemia - Follow CBC in a.m.  DVT prophylaxis: Lovenox Code Status: DO NOT RESUSCITATE Family Communication: None at bedside today. Disposition Plan: Continue monitoring in stepdown unit for additional 24 hours for recurrence of rapid A. fib/flutter. Possible discharge home in the next 24-48 hours.   Consultants:  General surgery  Cardiology  Procedures:  NG tube decompression  Antimicrobials:  IV Rocephin 3/11 >   Subjective: States that she feels much better and is anxious to go home. Denies chest pain, dyspnea, palpitations or abdominal pain. Good output through colostomy.  Objective: Filed Vitals:   10/06/15 0500 10/06/15 0600 10/06/15 0640 10/06/15 0800  BP: 128/59 120/65    Pulse:      Temp:    98.2 F (36.8 C)  TempSrc:    Oral  Resp: 16 15    Height:      Weight:   61.8 kg (136 lb  3.9 oz)   SpO2: 89% 94%      Intake/Output Summary (Last 24 hours) at 10/06/15 0953 Last data filed at 10/06/15 0813  Gross per 24 hour  Intake   1250 ml  Output   2051 ml  Net   -801 ml    Filed Weights   10/04/15 2105 10/06/15 0640  Weight: 60.782 kg (134 lb) 61.8 kg (136 lb 3.9 oz)    Exam:  General exam: Pleasant elderly female lying comfortably supine in bed. Respiratory system: Clear. No increased work of breathing. Cardiovascular system: S1 & S2 heard, RRR. No JVD, murmurs, gallops, clicks or pedal edema.Telemetry: Reverted to sinus rhythm approximately 4 AM on 3/13. Gastrointestinal system: Abdomen is nondistended, soft and nontender. Normal bowel sounds heard. Colostomy draining moderate amount of liquid green stools. NG tube in place connected to intermittent wall suction. Central nervous system: Alert and oriented. No focal neurological deficits. Extremities: Symmetric 5 x 5 power.   Data Reviewed: Basic Metabolic Panel:  Recent Labs Lab 09/30/15 1119 10/04/15 1222 10/05/15 0453  NA 139 134* 135  K 4.0 4.6 4.3  CL 105 96* 100*  CO2 27 28 26   GLUCOSE 102* 127* 101*  BUN 8 17 16   CREATININE 0.81 1.04* 0.82  CALCIUM 8.7* 9.1 8.3*  MG  --   --  1.8  PHOS  --   --  3.8   Liver Function Tests:  Recent Labs Lab 10/04/15 1222 10/05/15 0453  AST 17 15  ALT 14 11*  ALKPHOS 87 77  BILITOT 0.6 0.7  PROT 6.4* 5.4*  ALBUMIN 3.5 3.0*    Recent Labs Lab 10/04/15 1222  LIPASE 22   No results for input(s): AMMONIA in the last 168 hours. CBC:  Recent Labs Lab 10/04/15 1222 10/05/15 0453 10/06/15 0321  WBC 6.4 3.1* 4.0  HGB 13.5 12.6 11.9*  HCT 39.9 38.6 35.5*  MCV 91.7 95.1 93.2  PLT 245 237 193   Cardiac Enzymes: No results for input(s): CKTOTAL, CKMB, CKMBINDEX, TROPONINI in the last 168 hours. BNP (last 3 results) No results for input(s): PROBNP in the last 8760 hours. CBG: No results for input(s): GLUCAP in the last 168 hours.  Recent Results (from the past 240 hour(s))  MRSA PCR Screening     Status: None   Collection Time: 10/05/15  6:47 PM  Result Value Ref Range Status   MRSA by PCR NEGATIVE NEGATIVE Final    Comment:         The GeneXpert MRSA Assay (FDA approved for NASAL specimens only), is one component of a comprehensive MRSA colonization surveillance program. It is not intended to diagnose MRSA infection nor to guide or monitor treatment for MRSA infections.          Studies: Ct Abdomen Pelvis W Contrast  10/04/2015  CLINICAL DATA:  History of bladder cancer, bladder tumor removed on Monday. Abdominal pain, vomiting. EXAM: CT ABDOMEN AND PELVIS WITH CONTRAST TECHNIQUE: Multidetector CT imaging of the abdomen and pelvis was performed using the standard protocol following bolus administration of intravenous contrast. CONTRAST:  41mL OMNIPAQUE IOHEXOL 300 MG/ML SOLN, 30mL OMNIPAQUE IOHEXOL 300 MG/ML SOLN COMPARISON:  08/31/2013 FINDINGS: Lower chest: There is cardiomegaly. Pacer wires noted in the right heart. Coronary artery calcifications present. Bilateral lower lobe atelectasis. No pleural effusions. Hepatobiliary: Unremarkable appearance Pancreas: Atrophic.  No mass appear Spleen: Unremarkable appearance Adrenals/Urinary Tract: Areas of scarring in the kidneys bilaterally. Small cysts in the right kidney. No hydronephrosis. Adrenal glands are  unremarkable. Bladder is decompressed. Within the right lower pelvis, there is a 5.2 x 4.9 cm rim enhancing mass with central low-density and gas. This likely reflects a residual tumor. Stomach/Bowel: Patient is status post left lower quadrant colostomy. Extensive diverticulosis throughout the colon. There is small bowel dilatation throughout the abdomen and into the pelvis. Distal small bowel is decompressed. In addition, there is swirled appearance of the mesenteric vessels centrally. This is concerning for internal hernia or low less likely volvulus as the cause of the obstruction. There is moderate free fluid in the pelvis. Small amount of free fluid around the spleen and liver. Free fluid also noted within the left lower quadrant ostomy site. Vascular/Lymphatic:  Aorta is heavily calcified, non aneurysmal. No adenopathy. Reproductive: Prior hysterectomy.  No adnexal masses. Other: No free air Musculoskeletal: Diffuse degenerative changes throughout the lumbar spine. IMPRESSION: Small bowel obstruction. There is a swirled appearance of the central mesenteric vessels suggesting the cause of the obstruction is internal hernia or less likely volvulus. Associated free fluid in the abdomen and pelvis. Left lower quadrant ostomy with extensive diverticular disease throughout the decompressed colon. Peripherally enhancing mass within the lower right pelvis, likely residual bladder wall tumor. Cardiomegaly, coronary artery disease. Bibasilar atelectasis. Electronically Signed   By: Rolm Baptise M.D.   On: 10/04/2015 17:05   Dg Abd 2 Views  10/06/2015  CLINICAL DATA:  Follow-up small bowel obstruction. EXAM: ABDOMEN - 2 VIEW COMPARISON:  10/05/2015 abdominal radiographs. FINDINGS: Enteric tube loops in the proximal stomach and terminates in the distal stomach. No residual dilated small bowel loops or air-fluid levels. No evidence of pneumatosis or pneumoperitoneum. No pathologic soft tissue calcifications. Pacemaker leads overlie the right atrium and right ventricular apex. Marked degenerative changes in the visualized thoracolumbar spine. IMPRESSION: Nonobstructive bowel gas pattern, with no residual dilated small bowel loops. Electronically Signed   By: Ilona Sorrel M.D.   On: 10/06/2015 09:16   Dg Abd 2 Views  10/05/2015  CLINICAL DATA:  Small bowel obstruction EXAM: ABDOMEN - 2 VIEW COMPARISON:  CT 10/04/2015 FINDINGS: NG tube is in the stomach. Continued dilated small bowel loops with air-fluid levels, likely not significantly changed when compared to the scout film on prior CT. No free air organomegaly. IMPRESSION: Continued small bowel obstruction pattern.  No real change. Electronically Signed   By: Rolm Baptise M.D.   On: 10/05/2015 10:18        Scheduled  Meds: . cefTRIAXone (ROCEPHIN)  IV  1 g Intravenous Q24H  . [START ON 10/07/2015] digoxin  0.125 mg Intravenous Daily  . enoxaparin (LOVENOX) injection  40 mg Subcutaneous Q24H   Continuous Infusions: . sodium chloride 75 mL/hr (10/06/15 0803)  . diltiazem (CARDIZEM) infusion 5 mg/hr (10/06/15 0942)    Principal Problem:   SBO (small bowel obstruction) ([place]) Active Problems:   PAF- fib and flutter   S/P [facility] Feb 2006 after failed RFA   Dehydration   Hyponatremia   Unspecified protein-calorie malnutrition ([place])   Cancer involving bladder by direct extension from ovary [facility])   Atrial flutter with rapid ventricular response ([facility])   RBBB    Time spent: 20 minutes.    Vernell Leep, MD, FACP, FHM. [facility] Pager [phone]  If 7PM-7AM, please contact night-coverage [email or web address] Password [email or web address] 10/06/2015, 9:53 AM    LOS: 2 days

## 2015-10-06 NOTE — Consult Note (Signed)
Reason for Consult:   AF with RVR  Requesting Physician: [facility] Primary Cardiologist Dr Sallyanne Kuster  HPI:   82 y/o female with a history of atrial flutter and atrial fibrillation. She has had prior RFA 2010. She had a ST Jude pacemaker implanted in 2010. She has no history of CAD or MI. Echo in Nov 2016 showed normal LVF-EF 60-65%. 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 She has been unable to tolerate anticoagulation (CHADs VASc=4). She was recently admitted for surgery 09/29/15- TURBT. She was discharged 09/30/15 and came back to the ED 10/04/15 with nausea and vomiting. She was diagnosed with SBO and is being treated conservatively and is NPO with NG in place. She was in NSR on admission but went into atrial flutter with 2:1 conduction later in the after noon. She was placed on IV Diltiazem but dropped her pressure. She was then loaded with Lanoxin. This am she was back in NSR. She tolerated this well.   PMHx:  Past Medical History  Diagnosis Date  . Hyperlipidemia   . Diverticulosis   . Colostomy in place [facility])   . Fluid collection (edema) in the arms, legs, hands and feet   . Hypertension   . RBBB   . Iron deficiency anemia   . Gross hematuria     unable to anticoagulate  . Cardiac pacemaker in situ 2010    [employer]-- PLACEMENT 09-10-2008  . Paroxysmal atrial flutter (HCC)     CARDIOLOGIST--  DR CROITORU  . SSS (sick sinus syndrome) (HCC)     S/P PACEMAKER  . Bladder cancer [facility]) recurrent bladder tumor     secondarty to ovarian cancer--  palliative radiation 09-13-2013 to 10-22-2013  . Malignant neoplasm of ovary ([place]) ONOCOLOGIST--  DR Benay Spice  03-13-2012  w/ rectal mass    dx  primay ovarian cancer , +cytokeratin 7, estrogen receptor and WT-1--  chemotherapy and s/p omentectomy and bilateral oophorectomy03-05-2013  . Nausea 2015    chronic intermittent nausea after chemo    Past Surgical History  Procedure Laterality  Date  . Permanent pacemaker insertion  09-10-2008    St.Jude  . Rib removed  1934    Pleurisy and pneumonia  . Bladder neck suspension  1986  . Colon resection  04/04/2012    Procedure: COLON RESECTION;  Surgeon: Rolm Bookbinder, MD;  Location: [facility];  Service: General;  Laterality: N/A;  laparotomy with small bowel resection for obstruction with colostomy with gastrostomy tube placement.   . Portacath placement  05/04/2012    Procedure: INSERTION PORT-A-CATH;  Surgeon: Rolm Bookbinder, MD;  Location: [facility];  Service: General;  Laterality: Right;  . Laparotomy Bilateral 10/03/2012    Procedure: EXPLORATORY LAPAROTOMY;  Surgeon: Imagene Gurney A. Alycia Rossetti, MD;  Location: [facility];  Service: Gynecology;  Laterality: Bilateral;  WITH BSO, TUMOR DEBULKING  . Colostomy revision N/A 10/03/2012    Procedure: COLOSTOMY REVISION;  Surgeon: Rolm Bookbinder, MD;  Location: [facility];  Service: General;  Laterality: N/A;  COLOSTOMY REVISION, Sigmoid colectomy  . Port-a-cath removal N/A 02/21/2013    Procedure: REMOVAL PORT-A-CATH;  Surgeon: Rolm Bookbinder, MD;  Location: [facility];  Service: General;  Laterality: N/A;  . Transurethral resection of bladder tumor with gyrus (turbt-gyrus) N/A 09/05/2013    Procedure: TRANSURETHRAL RESECTION OF BLADDER TUMOR WITH GYRUS (TURBT-GYRUS);  Surgeon: Fredricka Bonine, MD;  Location: [facility];  Service: Urology;  Laterality: N/A;  .  Transurethral resection of bladder tumor N/A 06/27/2014    Procedure: TRANSURETHRAL RESECTION OF BLADDER TUMOR (TURBT);  Surgeon: Jorja Loa, MD;  Location: [facility];  Service: Urology;  Laterality: N/A;  . Cystoscopy N/A 06/27/2014    Procedure: CYSTOSCOPY;  Surgeon: Jorja Loa, MD;  Location: [facility];  Service: Urology;  Laterality: N/A;  . Abdominal hysterectomy  1972  . Cardiac electrophysiology mapping and ablation  05-21-2008   dr Caryl Comes    a-flutter  . Electrophysiology study  09-26-2008  dr Caryl Comes  03-27-2012  dr croitoru     cardiac ,  no ablation in 2010/   2013  overdrive pacing atrial flutter  . Transthoracic echocardiogram  07-17-2008      ef >55%,  mild to moderate MR and TR,  mild  sclerotic AV without stenosis,    . Cardiovascular stress test  07-17-2008  dr croitoru    normal myocardial perfusion w/ attenuation artifact in the anterior region of mycardium;  no ischemia or infarct/scar/  low risk scan/  normal LV function and wall motion , ef  83%  . Transurethral resection of bladder tumor N/A 11/18/2014    Procedure: TRANSURETHRAL RESECTION OF BLADDER TUMOR (TURBT);  Surgeon: Franchot Gallo, MD;  Location[facility]  Service: Urology;  Laterality: N/A;  . Cystoscopy N/A 11/18/2014    Procedure: CYSTOSCOPY;  Surgeon: Franchot Gallo, MD;  Location: [facility];  Service: Urology;  Laterality: N/A;  . Transurethral resection of bladder tumor with gyrus (turbt-gyrus) N/A 09/29/2015    Procedure: TRANSURETHRAL RESECTION OF BLADDER TUMOR WITH GYRUS (TURBT-GYRUS);  Surgeon: Franchot Gallo, MD;  [facility];  Service: Urology;  Laterality: N/A;    SOCHx:  reports that she has never smoked. She has never used smokeless tobacco. She reports that she does not drink alcohol or use illicit drugs.  FAMHx: Family History  Problem Relation Age of Onset  . Heart disease Mother   . CAD Mother   . Stroke Sister   . Cancer Neg Hx   . Diabetes Neg Hx     ALLERGIES: Allergies  Allergen Reactions  . Pindolol Nausea Only and Swelling    Throat swells  . Iodine Other (See Comments)    BLISTERS  . Shellfish Allergy Diarrhea and Nausea And Vomiting  . Vibramycin [Doxycycline Calcium] Nausea Only and Other (See Comments)    dizziness  . Doxycycline Nausea Only  . Levofloxacin Other (See Comments)    DIZZINESS  . Multaq [Dronedarone] Other (See Comments)    CAUSED SEVERE BURNING GI TRACT  . Vicodin [Hydrocodone-Acetaminophen] Other (See Comments)    dizziness     ROS: Review of Systems: General: negative for chills, fever, night sweats or weight changes.  Cardiovascular: negative for chest pain, dyspnea on exertion, edema, orthopnea, palpitations, paroxysmal nocturnal dyspnea or shortness of breath HEENT: negative for any visual disturbances, blindness, glaucoma Dermatological: negative for rash Respiratory: negative for cough, hemoptysis, or wheezing Urologic: negative for hematuria or dysuria Abdominal: See HPI Neurologic: negative for visual changes, syncope, or dizziness Musculoskeletal: negative for back pain, joint pain, or swelling Psych: cooperative and appropriate All other systems reviewed and are otherwise negative except as noted above.   HOME MEDICATIONS: Prior to Admission medications   Medication Sig Start Date End Date Taking? Authorizing Provider  Carboxymethylcell-Hypromellose (GENTEAL OP) Apply 1 drop to eye 2 (two) times daily as needed (Dry eyes).   Yes Historical Provider, MD  cephALEXin (KEFLEX) 500 MG capsule Take  1 capsule (500 mg total) by mouth 2 (two) times daily. 09/30/15  Yes Franchot Gallo, MD  diltiazem (CARDIZEM CD) 360 MG 24 hr capsule Take 1 capsule (360 mg total) by mouth daily. Patient taking differently: Take 360 mg by mouth every morning.  09/24/14  Yes Mihai Croitoru, MD  docusate sodium (COLACE) 100 MG capsule Take 100 mg by mouth daily as needed for mild constipation.   Yes Historical Provider, MD  furosemide (LASIX) 20 MG tablet Take 1 tablet (20 mg total) by mouth daily as needed. Patient taking differently: Take 20 mg by mouth daily as needed for fluid.  07/01/15  Yes Mihai Croitoru, MD  iron polysaccharides (NIFEREX) 150 MG capsule Take 1 capsule (150 mg total) by mouth daily. Patient taking differently: Take 150 mg by mouth every evening.  09/07/13  Yes Leanna Battles, MD  lactose free nutrition (BOOST) LIQD Take 237 mLs by mouth 2 (two) times a week.    Yes Historical Provider, MD  polyethylene  glycol (MIRALAX / GLYCOLAX) packet Take 17 g by mouth daily as needed for mild constipation or moderate constipation.   Yes Historical Provider, MD  potassium chloride (K-DUR) 10 MEQ tablet Take 1 tablet (10 mEq total) by mouth daily. Patient taking differently: Take 10 mEq by mouth daily as needed (Fluid).  06/09/15  Yes Mihai Croitoru, MD    HOSPITAL MEDICATIONS: I have reviewed the patient's current medications.  VITALS: Blood pressure 120/65, pulse 150, temperature 98.2 F (36.8 C), temperature source Oral, resp. rate 15, height 5' (1.524 m), weight 136 lb 3.9 oz (61.8 kg), SpO2 94 %.  PHYSICAL EXAM: General appearance: alert, cooperative, no distress and pale Neck: no carotid bruit and no JVD Lungs: clear to auscultation bilaterally Heart: regular rate and rhythm Abdomen: colostomy Lt, not distended, BS quiet Extremities: extremities normal, atraumatic, no cyanosis or edema Pulses: 2+ and symmetric Skin: pale cool dry Neurologic: Grossly normal  LABS: Results for orders placed or performed during the hospital encounter of 10/04/15 (from the past 24 hour(s))  MRSA PCR Screening     Status: None   Collection Time: 10/05/15  6:47 PM  Result Value Ref Range   MRSA by PCR NEGATIVE NEGATIVE  CBC     Status: Abnormal   Collection Time: 10/06/15  3:21 AM  Result Value Ref Range   WBC 4.0 4.0 - 10.5 K/uL   RBC 3.81 (L) 3.87 - 5.11 MIL/uL   Hemoglobin 11.9 (L) 12.0 - 15.0 g/dL   HCT 35.5 (L) 36.0 - 46.0 %   MCV 93.2 78.0 - 100.0 fL   MCH 31.2 26.0 - 34.0 pg   MCHC 33.5 30.0 - 36.0 g/dL   RDW 14.7 11.5 - 15.5 %   Platelets 193 150 - 400 K/uL    EKG: NSR on admission with RBBB  IMAGING: Ct Abdomen Pelvis W Contrast  10/04/2015  CLINICAL DATA:  History of bladder cancer, bladder tumor removed on Monday. Abdominal pain, vomiting. EXAM: CT ABDOMEN AND PELVIS WITH CONTRAST TECHNIQUE: Multidetector CT imaging of the abdomen and pelvis was performed using the standard protocol  following bolus administration of intravenous contrast. CONTRAST:  75mL OMNIPAQUE IOHEXOL 300 MG/ML SOLN, 10mL OMNIPAQUE IOHEXOL 300 MG/ML SOLN COMPARISON:  08/31/2013 FINDINGS: Lower chest: There is cardiomegaly. Pacer wires noted in the right heart. Coronary artery calcifications present. Bilateral lower lobe atelectasis. No pleural effusions. Hepatobiliary: Unremarkable appearance Pancreas: Atrophic.  No mass appear Spleen: Unremarkable appearance Adrenals/Urinary Tract: Areas of scarring in the kidneys bilaterally.  Small cysts in the right kidney. No hydronephrosis. Adrenal glands are unremarkable. Bladder is decompressed. Within the right lower pelvis, there is a 5.2 x 4.9 cm rim enhancing mass with central low-density and gas. This likely reflects a residual tumor. Stomach/Bowel: Patient is status post left lower quadrant colostomy. Extensive diverticulosis throughout the colon. There is small bowel dilatation throughout the abdomen and into the pelvis. Distal small bowel is decompressed. In addition, there is swirled appearance of the mesenteric vessels centrally. This is concerning for internal hernia or low less likely volvulus as the cause of the obstruction. There is moderate free fluid in the pelvis. Small amount of free fluid around the spleen and liver. Free fluid also noted within the left lower quadrant ostomy site. Vascular/Lymphatic: Aorta is heavily calcified, non aneurysmal. No adenopathy. Reproductive: Prior hysterectomy.  No adnexal masses. Other: No free air Musculoskeletal: Diffuse degenerative changes throughout the lumbar spine. IMPRESSION: Small bowel obstruction. There is a swirled appearance of the central mesenteric vessels suggesting the cause of the obstruction is internal hernia or less likely volvulus. Associated free fluid in the abdomen and pelvis. Left lower quadrant ostomy with extensive diverticular disease throughout the decompressed colon. Peripherally enhancing mass within  the lower right pelvis, likely residual bladder wall tumor. Cardiomegaly, coronary artery disease. Bibasilar atelectasis. Electronically Signed   By: Rolm Baptise M.D.   On: 10/04/2015 17:05   Dg Abd 2 Views  10/06/2015  CLINICAL DATA:  Follow-up small bowel obstruction. EXAM: ABDOMEN - 2 VIEW COMPARISON:  10/05/2015 abdominal radiographs. FINDINGS: Enteric tube loops in the proximal stomach and terminates in the distal stomach. No residual dilated small bowel loops or air-fluid levels. No evidence of pneumatosis or pneumoperitoneum. No pathologic soft tissue calcifications. Pacemaker leads overlie the right atrium and right ventricular apex. Marked degenerative changes in the visualized thoracolumbar spine. IMPRESSION: Nonobstructive bowel gas pattern, with no residual dilated small bowel loops. Electronically Signed   By: Ilona Sorrel M.D.   On: 10/06/2015 09:16   Dg Abd 2 Views  10/05/2015  CLINICAL DATA:  Small bowel obstruction EXAM: ABDOMEN - 2 VIEW COMPARISON:  CT 10/04/2015 FINDINGS: NG tube is in the stomach. Continued dilated small bowel loops with air-fluid levels, likely not significantly changed when compared to the scout film on prior CT. No free air organomegaly. IMPRESSION: Continued small bowel obstruction pattern.  No real change. Electronically Signed   By: Rolm Baptise M.D.   On: 10/05/2015 10:18    IMPRESSION: Principal Problem:   SBO (small bowel obstruction) (HCC) Active Problems:   Atrial flutter with rapid ventricular response (HCC)   PAF- fib and flutter   Cancer involving bladder by direct extension from ovary [facility])   S/P St Jude Pacer Feb 2006 after failed RFA   Dehydration   Hyponatremia   Unspecified protein-calorie malnutrition (HCC)   RBBB   RECOMMENDATION: Her BP is currently [id number] systolic, she appears to be back in AF with VR 100. I would try IV Diltiazem-5 mg/Hr. She had been on Diltiazem 360 mg daily prior to admission. Continue Lanoxin loading in case B/P  doesn't tolerate IV Diltiazem. She says Dr Sallyanne Kuster took her off Toprol in the past (? not sure why). We will follow.   Time Spent Directly with Patient: 69 minutes  Kerin Ransom, [place] beeper 10/06/2015, 9:55 AM    Attending Note:   The patient was seen and examined.  Agree with assessment and plan as noted above.  Changes made to the  above note as needed.  Pt has known atrial fib and now presents with a SBO and has not been able to take her meds HR is currently well controlled on IV Dilt.  Continue IV dilt while she is NPO.   Resume meds once she is taking PO again    Ramond Dial., MD, [facility] 10/06/2015, 12:21 PM [address],  [place] Pager [phone]

## 2015-10-07 DIAGNOSIS — E876 Hypokalemia: Secondary | ICD-10-CM

## 2015-10-07 LAB — CBC
HEMATOCRIT: 37.4 % (ref 36.0–46.0)
Hemoglobin: 12.7 g/dL (ref 12.0–15.0)
MCH: 31.4 pg (ref 26.0–34.0)
MCHC: 34 g/dL (ref 30.0–36.0)
MCV: 92.3 fL (ref 78.0–100.0)
Platelets: 249 10*3/uL (ref 150–400)
RBC: 4.05 MIL/uL (ref 3.87–5.11)
RDW: 14.1 % (ref 11.5–15.5)
WBC: 6.3 10*3/uL (ref 4.0–10.5)

## 2015-10-07 LAB — BASIC METABOLIC PANEL
ANION GAP: 10 (ref 5–15)
BUN: 10 mg/dL (ref 6–20)
CO2: 26 mmol/L (ref 22–32)
Calcium: 8 mg/dL — ABNORMAL LOW (ref 8.9–10.3)
Chloride: 100 mmol/L — ABNORMAL LOW (ref 101–111)
Creatinine, Ser: 0.87 mg/dL (ref 0.44–1.00)
GFR, EST NON AFRICAN AMERICAN: 59 mL/min — AB (ref >60)
GLUCOSE: 90 mg/dL (ref 65–99)
POTASSIUM: 3.1 mmol/L — AB (ref 3.5–5.1)
Sodium: 136 mmol/L (ref 135–145)

## 2015-10-07 LAB — MAGNESIUM: Magnesium: 1.6 mg/dL — ABNORMAL LOW (ref 1.7–2.4)

## 2015-10-07 MED ORDER — DILTIAZEM HCL ER COATED BEADS 180 MG PO CP24
360.0000 mg | ORAL_CAPSULE | Freq: Every morning | ORAL | Status: DC
  Administered 2015-10-07 – 2015-10-08 (×2): 360 mg via ORAL
  Filled 2015-10-07 (×2): qty 2

## 2015-10-07 MED ORDER — HYDRALAZINE HCL 20 MG/ML IJ SOLN
5.0000 mg | Freq: Four times a day (QID) | INTRAMUSCULAR | Status: DC | PRN
  Administered 2015-10-09: 5 mg via INTRAVENOUS
  Filled 2015-10-07 (×2): qty 1

## 2015-10-07 MED ORDER — POTASSIUM CHLORIDE CRYS ER 20 MEQ PO TBCR
40.0000 meq | EXTENDED_RELEASE_TABLET | ORAL | Status: AC
  Administered 2015-10-07 (×2): 40 meq via ORAL
  Filled 2015-10-07 (×2): qty 2

## 2015-10-07 MED ORDER — MAGNESIUM SULFATE 2 GM/50ML IV SOLN
2.0000 g | Freq: Once | INTRAVENOUS | Status: AC
  Administered 2015-10-07: 2 g via INTRAVENOUS
  Filled 2015-10-07: qty 50

## 2015-10-07 NOTE — Progress Notes (Signed)
Received patient from [facility]. Agreed with her assessment. Patient is resting in bed.

## 2015-10-07 NOTE — Progress Notes (Signed)
PROGRESS NOTE Primary cardiologist :  Sanda Klein, MD  Subjective:   82 yo with metastatic ovarian cancer,  Atrial fib Off dilt drip as of now    Objective:    Vital Signs:   Temp:  [97.4 F (36.3 C)-98.4 F (36.9 C)] 97.4 F (36.3 C) (03/14 1204) Resp:  [15-27] 18 (03/14 1000) BP: (112-152)/(56-96) 121/73 mmHg (03/14 1000) SpO2:  [94 %-97 %] 96 % (03/14 1000)  Last BM Date: 10/06/15   24-hour weight change: Weight change:   Weight trends: Filed Weights   10/04/15 2105 10/06/15 0640  Weight: 134 lb (60.782 kg) 136 lb 3.9 oz (61.8 kg)    Intake/Output:  03/13 0701 - 03/14 0700 In: 1967.3 [P.O.:600; I.V.:1317.3; IV Piggyback:50] Out: [id number] [Urine:875; Emesis/NG output:210; Stool:500]     Physical Exam: BP 121/73 mmHg  Pulse 150  Temp(Src) 97.4 F (36.3 C) (Oral)  Resp 18  Ht 5' (1.524 m)  Wt 136 lb 3.9 oz (61.8 kg)  BMI 26.61 kg/m2  SpO2 96%  Wt Readings from Last 3 Encounters:  10/06/15 136 lb 3.9 oz (61.8 kg)  09/29/15 136 lb (61.689 kg)  09/25/15 136 lb (61.689 kg)    General: Vital signs reviewed and noted.   Head: Normocephalic, atraumatic.  Eyes: conjunctivae/corneas clear.  EOM's intact.   Throat: normal  Neck:  normal   Lungs:    clear   Heart:  irreg. Irreg. ,   Abdomen:  Soft, non-tender, non-distended    Extremities: No edema    Neurologic: A&O X3, CN II - XII are grossly intact.   Psych: Normal      Labs: BMET:  Recent Labs  10/05/15 0453 10/07/15 0316  NA 135 136  K 4.3 3.1*  CL 100* 100*  CO2 26 26  GLUCOSE 101* 90  BUN 16 10  CREATININE 0.82 0.87  CALCIUM 8.3* 8.0*  MG 1.8 1.6*  PHOS 3.8  --     Liver function tests:  Recent Labs  10/05/15 0453  AST 15  ALT 11*  ALKPHOS 77  BILITOT 0.7  PROT 5.4*  ALBUMIN 3.0*   No results for input(s): LIPASE, AMYLASE in the last 72 hours.  CBC:  Recent Labs  10/06/15 0321 10/07/15 0316  WBC 4.0 6.3  HGB 11.9* 12.7  HCT 35.5* 37.4  MCV 93.2 92.3    PLT 193 249    Cardiac Enzymes: No results for input(s): CKTOTAL, CKMB, TROPONINI in the last 72 hours.  Coagulation Studies: No results for input(s): LABPROT, INR in the last 72 hours.  Other: Invalid input(s): POCBNP No results for input(s): DDIMER in the last 72 hours. No results for input(s): HGBA1C in the last 72 hours. No results for input(s): CHOL, HDL, LDLCALC, TRIG, CHOLHDL in the last 72 hours.  Recent Labs  10/05/15 0456  TSH 1.986   No results for input(s): VITAMINB12, FOLATE, FERRITIN, TIBC, IRON, RETICCTPCT in the last 72 hours.   Other results:  EKG  ( personally reviewed ) - atrial fib with rate of 99  Medications:    Infusions:    Scheduled Medications: . digoxin  0.125 mg Intravenous Daily  . diltiazem  360 mg Oral q morning - 10a  . enoxaparin (LOVENOX) injection  40 mg Subcutaneous Q24H  . potassium chloride  40 mEq Oral Q4H    Assessment/ Plan:   Principal Problem:   SBO (small bowel obstruction) (HCC) Active Problems:   PAF- fib and flutter   S/P  St Jude Pacer Feb 2006 after failed RFA   Dehydration   Hyponatremia   Unspecified protein-calorie malnutrition ([place])   Cancer involving bladder by direct extension from ovary [facility])   Atrial flutter with rapid ventricular response (HCC)   RBBB  1. Atrial fib:   Rate is failry well controllled.  On PO cardizem 360 a day  - off the drip  Also on digoxin 0.25 a day   Will add metoprolol 25 BID Given her age, would like to lower the dose of digoxin soon    [facility] for her to ambulate  Disposition:  Length of Stay: 3  Thayer Headings, Brooke Bonito., MD, [facility] 10/07/2015, 2:16 PM Office [phone] Pager [phone]

## 2015-10-07 NOTE — Progress Notes (Signed)
[facility] Progress Note     Subjective: Pt doing well.  She's passing good flatus and lots of liquid brown stool from her ostomy.  NG out and tolerating a diet.   Objective: Vital signs in last 24 hours: Temp:  [97.4 F (36.3 C)-98.4 F (36.9 C)] 97.6 F (36.4 C) (03/14 0800) Resp:  [11-28] 27 (03/14 0900) BP: (112-152)/(56-96) 120/96 mmHg (03/14 0900) SpO2:  [91 %-97 %] 97 % (03/14 0900) Last BM Date: 10/14/15   Intake/Output from previous day: 10-14-22 0701 - 03/14 0700 In: 1967.3 [P.O.:600; I.V.:1317.3; IV Piggyback:50] Out: 1585 [Urine:875; Emesis/NG output:210; Stool:500] Intake/Output this shift:    PE: Gen:  Alert, NAD, pleasant Abd: Soft, NT/ND,  ostomy patent with lots of flatus/brown liquid stools in bag Ext:  No erythema, edema, or tenderness   Lab Results:   Recent Labs  10/14/2015 0321 10/07/15 0316  WBC 4.0 6.3  HGB 11.9* 12.7  HCT 35.5* 37.4  PLT 193 249   BMET  Recent Labs  10/05/15 0453 10/07/15 0316  NA 135 136  K 4.3 3.1*  CL 100* 100*  CO2 26 26  GLUCOSE 101* 90  BUN 16 10  CREATININE 0.82 0.87  CALCIUM 8.3* 8.0*   PT/INR No results for input(s): LABPROT, INR in the last 72 hours. CMP     Component Value Date/Time   NA 136 10/07/2015 0316   NA 135* 11/21/2012 0920   K 3.1* 10/07/2015 0316   K 4.3 11/21/2012 0920   CL 100* 10/07/2015 0316   CL 103 11/21/2012 0920   CO2 26 10/07/2015 0316   CO2 25 11/21/2012 0920   GLUCOSE 90 10/07/2015 0316   GLUCOSE 101* 11/21/2012 0920   BUN 10 10/07/2015 0316   BUN 13.3 11/21/2012 0920   CREATININE 0.87 10/07/2015 0316   CREATININE 1.02* 06/26/2015 1137   CREATININE 0.8 11/21/2012 0920   CALCIUM 8.0* 10/07/2015 0316   CALCIUM 8.7 11/21/2012 0920   PROT 5.4* 10/05/2015 0453   PROT 6.7 10/24/2012 0840   ALBUMIN 3.0* 10/05/2015 0453   ALBUMIN 3.3* 10/24/2012 0840   AST 15 10/05/2015 0453   AST 17 10/24/2012 0840   ALT 11* 10/05/2015 0453   ALT 8 10/24/2012 0840   ALKPHOS  77 10/05/2015 0453   ALKPHOS 89 10/24/2012 0840   BILITOT 0.7 10/05/2015 0453   BILITOT 0.29 10/24/2012 0840   GFRNONAA 59* 10/07/2015 0316   GFRAA >60 10/07/2015 0316   Lipase     Component Value Date/Time   LIPASE 22 10/04/2015 1222       Studies/Results: Dg Abd 2 Views  14-Oct-2015  CLINICAL DATA:  Follow-up small bowel obstruction. EXAM: ABDOMEN - 2 VIEW COMPARISON:  10/05/2015 abdominal radiographs. FINDINGS: Enteric tube loops in the proximal stomach and terminates in the distal stomach. No residual dilated small bowel loops or air-fluid levels. No evidence of pneumatosis or pneumoperitoneum. No pathologic soft tissue calcifications. Pacemaker leads overlie the right atrium and right ventricular apex. Marked degenerative changes in the visualized thoracolumbar spine. IMPRESSION: Nonobstructive bowel gas pattern, with no residual dilated small bowel loops. Electronically Signed   By: Ilona Sorrel M.D.   On: 10-14-2015 09:16    Anti-infectives: Anti-infectives    Start     Dose/Rate Route Frequency Ordered Stop   10/05/15 1500  cefTRIAXone (ROCEPHIN) 1 g in dextrose 5 % 50 mL IVPB     1 g 100 mL/hr over 30 Minutes Intravenous Every 24 hours 10/04/15 2157  10/04/15 1445  cefTRIAXone (ROCEPHIN) 1 g in dextrose 5 % 50 mL IVPB     1 g 100 mL/hr over 30 Minutes Intravenous  Once 10/04/15 1433 10/04/15 1625       Assessment/Plan H/o ovarian cancer s/p diverting colostomy Dr. Donne Hazel 3 years ago S/p transurethral resection of bladder tumor (TURBT) 09/29/15 Dr. Beatrix Fetters SBO HD #4 -Repeat KUB shows non-obstructive bowel gas pattern - NG removed and diet advanced without difficulty.  Pt having bowel function. - Will sign off.  Please call us if any questions or concerns arise     LOS: 3 days    Jekhi Bolin C. [id number], [id number] AM

## 2015-10-07 NOTE — Progress Notes (Signed)
PROGRESS NOTE    Victoria [name]  [id number]  DOB: 26-Mar-1927  DOA: 10/04/2015 PCP: Donnajean Lopes, MD Outpatient Specialists: Medical oncology: Dr. Ladell Pier Cardiology: Dr. 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 General surgery consulted. Treated conservatively with bowel rest and NG tube decompression. SBO resolved & surgery planning to advance diet. Transferred to SDU on 3/12 due to rapid A. fib/flutter-reverted to sinus rhythm after digoxin load greater than and paroxysmal A. fib and placed on IV Cardizem drip at 5 mcg and IV digoxin. Cardiology consulted. SBO resolved. Starting back home dose of oral Cardizem CD and will discontinue Cardizem drip. Received today's dose of digoxin, considered discontinuing 3/15. Possible discharge home 3/15.  Assessment & Plan:   Paroxysmal atrial flutter/fib with RVR - Patient is on Cardizem CD 360 mg daily at home which she was unable to tolerate since 3/9 due to SBO symptoms. - On 10/05/15 afternoon, she went into symptomatic atrial flutter with RVR & 2:1 block. She was transferred to stepdown unit. Attempted to start Cardizem drip but was hypotensive (may even have been rate related) and hence not continued. Case was discussed with cardiologist on-call and started on digoxin load. - Patient reverted to sinus rhythm at approximately 4 AM on 3/13 but then had paroxysmal A. fib. Cardiology input appreciated. Patient was placed on IV Cardizem  drip at 5 g and continued on IV digoxin. - SBO resolved. Resume home dose of oral Cardizem CD 360 daily and discontinue Cardizem drip 2 hours later. Has received today's dose of IV digoxin-reassess to discontinue on 3/15. Discussed with cardiology. - CHADs VASc=4). Not candidate for anticoagulation secondary to hematuria from ovarian cancer with bladder matter stasis. Has been off of Coumadin for approximately 2 years.  SBO - May be from ? Internal hernia or volvulus. - Gen. surgery consulted and patient managed conservatively with bowel rest, NG tube decompression, IV fluids, serial exams and x-rays. - SBO resolved. Advance diet. Surgery signed off 3/14.  Dehydration - Secondary to GI losses. Resolved. Tolerating diet. Discontinue IV fluids.  Essential hypertension/hypotension - Briefly developed hypotension overnight 3/12, may be rate related from A. fib with RVR. Resolved. Monitor  Ovarian cancer - Outpatient follow-up with oncology  Possible UTI - Patient had completed course of Keflex after recent bladder surgery. Urine microscopy on admission showed many bacteria and WBCs and RBCs. She was empirically started on IV Rocephin but urine cultures were never sent. No dysuria or urinary frequency. Hematuria improved. Completed 3 days of IV Rocephin. Discontinue antibiotics.  Bladder Cancer - recent surgery  Anemia - Hemoglobin normal  Hypokalemia - Replace and follow  DVT prophylaxis: Lovenox Code Status: DO NOT RESUSCITATE Family Communication: None at bedside today. Disposition Plan: Continue monitoring in stepdown unit for additional 24 hours for recurrence of rapid A. fib/flutter. Possible discharge home 3/15   Consultants:  General surgery-signed off  Cardiology  Procedures:  NG tube decompression-discontinued  Antimicrobials:  IV Rocephin 3/11 > 3/13  Subjective: Tolerating full liquid diet. Stool consistency in colostomy more thick. No abdominal pain,  nausea  or vomiting. As per RN, no acute issues.  Objective: Filed Vitals:   10/07/15 0400 10/07/15 0700 10/07/15 0800 10/07/15 0900  BP:  152/85 128/78 120/96  Pulse:      Temp: 98.2 F (36.8 C)  97.6 F (36.4 C)   TempSrc: Oral  Oral   Resp:  19 22 27   Height:      Weight:      SpO2:  96% 96% 97%    Intake/Output Summary (Last 24 hours) at 10/07/15 1033 Last data filed at 10/07/15 0700  Gross per 24 hour  Intake 1740.84 ml  Output   1385 ml  Net 355.84 ml   Filed Weights   10/04/15 2105 10/06/15 0640  Weight: 60.782 kg (134 lb) 61.8 kg (136 lb 3.9 oz)    Exam:  General exam: Pleasant elderly female lying comfortably supine in bed. Respiratory system: Clear. No increased work of breathing. Cardiovascular system: S1 & S2 heard, RRR. No JVD, murmurs, gallops, clicks or pedal edema.Telemetry: Sinus rhythm with occasional PVCs and occasional pacemaker spikes. Gastrointestinal system: Abdomen is nondistended, soft and nontender. Normal bowel sounds heard. Colostomy draining moderate amount of liquid green stools.  Central nervous system: Alert and oriented. No focal neurological deficits. Extremities: Symmetric 5 x 5 power.   Data Reviewed: Basic Metabolic Panel:  Recent Labs Lab 09/30/15 1119 10/04/15 1222 10/05/15 0453 10/07/15 0316  NA 139 134* 135 136  K 4.0 4.6 4.3 3.1*  CL 105 96* 100* 100*  CO2 27 28 26 26   GLUCOSE 102* 127* 101* 90  BUN 8 17 16 10   CREATININE 0.81 1.04* 0.82 0.87  CALCIUM 8.7* 9.1 8.3* 8.0*  MG  --   --  1.8  --   PHOS  --   --  3.8  --    Liver Function Tests:  Recent Labs Lab 10/04/15 1222 10/05/15 0453  AST 17 15  ALT 14 11*  ALKPHOS 87 77  BILITOT 0.6 0.7  PROT 6.4* 5.4*  ALBUMIN 3.5 3.0*    Recent Labs Lab 10/04/15 1222  LIPASE 22   No results for input(s): AMMONIA in the last 168 hours. CBC:  Recent Labs Lab 10/04/15 1222 10/05/15 0453 10/06/15 0321 10/07/15 0316  WBC 6.4 3.1* 4.0 6.3  HGB 13.5 12.6 11.9* 12.7    HCT 39.9 38.6 35.5* 37.4  MCV 91.7 95.1 93.2 92.3  PLT 245 237 193 249   Cardiac Enzymes: No results for input(s): CKTOTAL, CKMB, CKMBINDEX, TROPONINI in the last 168 hours. BNP (last 3 results) No results for input(s): PROBNP in the last 8760 hours. CBG: No results for input(s): GLUCAP in the last 168 hours.  Recent Results (from the past 240 hour(s))  MRSA PCR Screening     Status: None   Collection Time: 10/05/15  6:47 PM  Result Value Ref Range Status   MRSA by PCR NEGATIVE NEGATIVE Final    Comment:        The GeneXpert MRSA Assay (FDA approved for NASAL specimens only), is one component of a comprehensive MRSA colonization surveillance program. It is not intended to diagnose MRSA infection nor to guide or monitor treatment for MRSA infections.          Studies: Dg Abd 2 Views  10/06/2015  CLINICAL DATA:  Follow-up small bowel obstruction. EXAM: ABDOMEN - 2 VIEW COMPARISON:  10/05/2015 abdominal radiographs. FINDINGS: Enteric tube loops in the proximal stomach and terminates in the distal stomach. No residual dilated small bowel loops  or air-fluid levels. No evidence of pneumatosis or pneumoperitoneum. No pathologic soft tissue calcifications. Pacemaker leads overlie the right atrium and right ventricular apex. Marked degenerative changes in the visualized thoracolumbar spine. IMPRESSION: Nonobstructive bowel gas pattern, with no residual dilated small bowel loops. Electronically Signed   By: Ilona Sorrel M.D.   On: 10/06/2015 09:16        Scheduled Meds: . digoxin  0.125 mg Intravenous Daily  . diltiazem  360 mg Oral q morning - 10a  . enoxaparin (LOVENOX) injection  40 mg Subcutaneous Q24H  . potassium chloride  40 mEq Oral Q4H   Continuous Infusions: . diltiazem (CARDIZEM) infusion 5 mg/hr (10/07/15 0600)    Principal Problem:   SBO (small bowel obstruction) (HCC) Active Problems:   PAF- fib and flutter   S/P [facility] Jude Pacer Feb 2006 after failed RFA    Dehydration   Hyponatremia   Unspecified protein-calorie malnutrition ([place])   Cancer involving bladder by direct extension from ovary [facility])   Atrial flutter with rapid ventricular response (HCC)   RBBB    Time spent: 20 minutes.    Vernell Leep, MD, FACP, FHM. [facility] Pager [phone]  If 7PM-7AM, please contact night-coverage [email or web address] Password [email or web address] 10/07/2015, 10:33 AM    LOS: 3 days

## 2015-10-08 ENCOUNTER — Inpatient Hospital Stay (HOSPITAL_COMMUNITY): Payer: Medicare Other

## 2015-10-08 DIAGNOSIS — I4510 Unspecified right bundle-branch block: Secondary | ICD-10-CM

## 2015-10-08 DIAGNOSIS — E860 Dehydration: Secondary | ICD-10-CM

## 2015-10-08 LAB — BASIC METABOLIC PANEL
ANION GAP: 11 (ref 5–15)
BUN: 7 mg/dL (ref 6–20)
CALCIUM: 8.7 mg/dL — AB (ref 8.9–10.3)
CO2: 27 mmol/L (ref 22–32)
Chloride: 95 mmol/L — ABNORMAL LOW (ref 101–111)
Creatinine, Ser: 0.83 mg/dL (ref 0.44–1.00)
GLUCOSE: 129 mg/dL — AB (ref 65–99)
POTASSIUM: 4 mmol/L (ref 3.5–5.1)
SODIUM: 133 mmol/L — AB (ref 135–145)

## 2015-10-08 MED ORDER — SODIUM CHLORIDE 0.9 % IV SOLN
INTRAVENOUS | Status: DC
  Administered 2015-10-08: 20:00:00 via INTRAVENOUS
  Filled 2015-10-08 (×2): qty 1000

## 2015-10-08 NOTE — Care Management Note (Deleted)
Case Management Note  Patient Details  Name: KENYANA LORA MRN: [id number] Date of Birth: 1926-10-18  Subjective/Objective: Asked to check benefit for Eliquis-per patient insurance-Tier 2(less cost to patient),would need script to confirm cost-MD notified. Since patient is from a SNF, & returning back-the facility will decide on the appropriate med for patient.                   Action/Plan:d/c plan SNF.   Expected Discharge Date:                  Expected Discharge Plan:  Home/Self Care  In-House Referral:     Discharge planning Services  CM Consult  Post Acute Care Choice:    Choice offered to:     DME Arranged:    DME Agency:     HH Arranged:    HH Agency:     Status of Service:  In process, will continue to follow  Medicare Important Message Given:    Date Medicare IM Given:    Medicare IM give by:    Date Additional Medicare IM Given:    Additional Medicare Important Message give by:     If discussed at [place] of Stay Meetings, dates discussed:    Additional Comments:  Dessa Phi, RN 10/08/2015, 2:20 PM

## 2015-10-08 NOTE — Progress Notes (Signed)
PT Cancellation Note  Patient Details Name: Victoria [name] MRN: [id number] DOB: 07-02-1927   Cancelled Treatment:     PT order received but eval deferred this am at request of RN - pt diastolic BP [id number].  Will follow.   Hakeen Shipes 10/08/2015, 1:34 PM

## 2015-10-08 NOTE — Progress Notes (Signed)
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 General surgery consulted. Treated conservatively with bowel rest and NG tube decompression. SBO resolved & surgery planning to advance diet. Transferred to SDU on 3/12 due to rapid A. fib/flutter-reverted to sinus rhythm after digoxin load greater than and paroxysmal A. fib and placed on IV Cardizem drip at 5 mcg and IV digoxin. Cardiology consulted. SBO resolved. Starting back home dose of oral Cardizem CD and will discontinue Cardizem drip. Received today's dose of digoxin, considered discontinuing 3/15.   Assessment & Plan:  Paroxysmal atrial flutter/fib with RVR - Patient is on Cardizem CD 360 mg daily at home which she was unable to tolerate since 3/9 due to SBO symptoms. - On 10/05/15 afternoon, she went into symptomatic atrial flutter with RVR & 2:1 block. She was transferred to stepdown unit. Attempted to start Cardizem drip but was hypotensive (may even have been rate related) and hence not continued. Case was discussed with cardiologist on-call and started on digoxin load. - Patient reverted to sinus rhythm at approximately 4 AM on 3/13 but then had paroxysmal A. fib. Cardiology input appreciated. Patient was placed on IV Cardizem drip at 5 g and continued on IV digoxin. - SBO resolved. Resume home dose of oral Cardizem CD 360 daily and discontinue  Cardizem drip 2 hours later. Has received today's dose of IV digoxin-reassess to discontinue on 3/15. Discussed with cardiology. - CHADs VASc=4). Not candidate for anticoagulation secondary to hematuria from ovarian cancer with bladder matter stasis. Has been off of Coumadin for approximately 2 years. -pt states she has "swelling" with pindolol--defer use of metoprolol to cardiology -continue digoxin for now -10/08/15--HR in upper 90s to low 100s  SBO - May be from ? Internal hernia or volvulus. - Gen. surgery consulted and patient managed conservatively with bowel rest, NG tube decompression, IV fluids, serial exams and x-rays. - SBO resolved. Advance diet. Surgery signed off 3/14. -10/08/15--discussed with surgery of pt concern that she passed mucus from rectum last evening  Dehydration - Secondary to GI losses. Resolved. Tolerating diet. Discontinue IV fluids.  Essential hypertension/hypotension - Briefly developed hypotension overnight 3/12, may be rate related from A. fib with RVR. Resolved. Monitor  Ovarian cancer - Outpatient follow-up with oncology  Possible UTI - Patient had completed course of Keflex after recent bladder surgery. Urine microscopy on admission showed many bacteria and WBCs and RBCs. She was empirically started on IV Rocephin but urine cultures were never sent. No dysuria or urinary frequency. Hematuria improved. Completed 3 days of IV Rocephin. Discontinue antibiotics.  Bladder Cancer - recent surgery  Anemia - Hemoglobin normal  Hypokalemia - Replace and follow -10/08/15--called lab to verify K= 4.0  DVT prophylaxis: Lovenox Code Status: DO NOT RESUSCITATE Family Communication: Granddaughter updated at bedside 10/08/15 Disposition Plan: Continue monitoring in stepdown unit   Consultants:  General surgery-signed off  Cardiology  Procedures:  NG tube decompression-discontinued  Antimicrobials:  IV Rocephin 3/11 >  3/13   Procedures/Studies: Ct Chest Wo Contrast  09/25/2015  CLINICAL DATA:  History of ovarian cancer diagnosed in 2013 and history of bladder cancer, presenting with right posterior chest wall pain. History of "rib removed in 1934" per EPIC. EXAM: CT CHEST WITHOUT CONTRAST TECHNIQUE: Multidetector CT imaging of the chest was performed following the standard protocol without IV contrast. COMPARISON:  08/31/2013 CT abdomen/ pelvis. 03/22/2012 PET-CT. No prior chest CT. 09/03/2013 chest radiograph. FINDINGS: Mediastinum/Nodes: Stable mild cardiomegaly. Two lead left subclavian pacemaker is noted with lead tips in the right atrium and right ventricular apex. No pericardial fluid/thickening. Coronary atherosclerosis. Great vessels are normal in course and caliber. Normal visualized thyroid. Normal esophagus. No pathologically enlarged axillary, mediastinal or gross hilar lymph nodes, noting limited sensitivity for the detection of hilar adenopathy on this noncontrast study. Lungs/Pleura: No pneumothorax. No pleural effusion. There is a 1.8 x 0.9 cm tracheal diverticulum at the right posterior trachea at the level of the thoracic inlet (series 5/image 9). There are postsurgical changes in the right lower lobe suggestive of wedge resection. There is varicoid and cystic bronchiectasis with associated parenchymal bands, architectural distortion and mild pleural thickening adjacent to surgical sutures in the superior peripheral right lower lobe. These findings are not appreciably changed compared to the 03/22/2012 PET-CT study. Calcified subcentimeter granuloma in the left upper lobe. No acute consolidative airspace disease, significant pulmonary nodules or lung masses. Upper abdomen: New 3 mm calcification in the lower left kidney, which may represent a nonobstructing left renal stone. Musculoskeletal: No aggressive appearing focal osseous lesions. Stable healed deformity in the posterior right ninth and tenth ribs.  Moderate degenerative changes in the thoracic spine. IMPRESSION: 1. No acute abnormality. Stable scarring and bronchiectasis at the site of prior right lower lobe lung surgery. Stable healed deformity in the posterior right ninth and tenth ribs. 2. Stable mild cardiomegaly without pulmonary edema. Coronary atherosclerosis. 3. New 3 left renal calcification, possibly a nonobstructing stone. Electronically Signed   By: Ilona Sorrel M.D.   On: 09/25/2015 14:31   Ct Abdomen Pelvis W Contrast  10/04/2015  CLINICAL DATA:  History of bladder cancer, bladder tumor removed on Monday. Abdominal pain, vomiting. EXAM: CT ABDOMEN AND PELVIS WITH CONTRAST TECHNIQUE: Multidetector CT imaging of the abdomen and pelvis was performed using the standard protocol following bolus administration of intravenous contrast. CONTRAST:  44mL OMNIPAQUE IOHEXOL 300 MG/ML SOLN, 21mL OMNIPAQUE IOHEXOL 300 MG/ML SOLN COMPARISON:  08/31/2013 FINDINGS: Lower chest: There is cardiomegaly. Pacer wires noted in the right heart. Coronary artery calcifications present. Bilateral lower lobe atelectasis. No pleural effusions. Hepatobiliary: Unremarkable appearance Pancreas: Atrophic.  No mass appear Spleen: Unremarkable appearance Adrenals/Urinary Tract: Areas of scarring in the kidneys bilaterally. Small cysts in the right kidney. No hydronephrosis. Adrenal glands are unremarkable. Bladder is decompressed. Within the right lower pelvis, there is a 5.2 x 4.9 cm rim enhancing mass with central low-density and gas. This likely reflects a residual tumor. Stomach/Bowel: Patient is status post left lower quadrant colostomy. Extensive diverticulosis throughout the colon. There is small bowel dilatation throughout the abdomen and into the pelvis. Distal small bowel is decompressed. In addition, there is swirled appearance of the mesenteric vessels centrally. This is concerning for internal hernia or low less likely volvulus as the cause of the obstruction.  There is moderate free fluid in the pelvis. Small amount of free fluid around the spleen and liver. Free fluid also noted within the left lower quadrant ostomy site. Vascular/Lymphatic: Aorta is heavily calcified,  non aneurysmal. No adenopathy. Reproductive: Prior hysterectomy.  No adnexal masses. Other: No free air Musculoskeletal: Diffuse degenerative changes throughout the lumbar spine. IMPRESSION: Small bowel obstruction. There is a swirled appearance of the central mesenteric vessels suggesting the cause of the obstruction is internal hernia or less likely volvulus. Associated free fluid in the abdomen and pelvis. Left lower quadrant ostomy with extensive diverticular disease throughout the decompressed colon. Peripherally enhancing mass within the lower right pelvis, likely residual bladder wall tumor. Cardiomegaly, coronary artery disease. Bibasilar atelectasis. Electronically Signed   By: Rolm Baptise M.D.   On: 10/04/2015 17:05   Dg Abd 2 Views  10/06/2015  CLINICAL DATA:  Follow-up small bowel obstruction. EXAM: ABDOMEN - 2 VIEW COMPARISON:  10/05/2015 abdominal radiographs. FINDINGS: Enteric tube loops in the proximal stomach and terminates in the distal stomach. No residual dilated small bowel loops or air-fluid levels. No evidence of pneumatosis or pneumoperitoneum. No pathologic soft tissue calcifications. Pacemaker leads overlie the right atrium and right ventricular apex. Marked degenerative changes in the visualized thoracolumbar spine. IMPRESSION: Nonobstructive bowel gas pattern, with no residual dilated small bowel loops. Electronically Signed   By: Ilona Sorrel M.D.   On: 10/06/2015 09:16   Dg Abd 2 Views  10/05/2015  CLINICAL DATA:  Small bowel obstruction EXAM: ABDOMEN - 2 VIEW COMPARISON:  CT 10/04/2015 FINDINGS: NG tube is in the stomach. Continued dilated small bowel loops with air-fluid levels, likely not significantly changed when compared to the scout film on prior CT. No free air  organomegaly. IMPRESSION: Continued small bowel obstruction pattern.  No real change. Electronically Signed   By: Rolm Baptise M.D.   On: 10/05/2015 10:18         Subjective: Patient complains of some nausea with potassium. She denies any vomiting. She states that she passed some mucus from her rectum on the evening of 10/07/2014. Denies any chest pain, short of breath, headache, fevers, chills, abdominal pain.  Objective: Filed Vitals:   10/07/15 2323 10/08/15 0000 10/08/15 0314 10/08/15 0400  BP:  149/92  165/99  Pulse:      Temp: 97.5 F (36.4 C)  98 F (36.7 C)   TempSrc: Oral  Oral   Resp:  17  16  Height:      Weight:      SpO2:  96%  95%    Intake/Output Summary (Last 24 hours) at 10/08/15 0817 Last data filed at 10/08/15 0600  Gross per 24 hour  Intake    272 ml  Output    350 ml  Net    -78 ml   Weight change:  Exam:   General:  Pt is alert, follows commands appropriately, not in acute distress  HEENT: No icterus, No thrush, No neck mass, [place]/AT  Cardiovascular: RRR, S1/S2, no rubs, no gallops  Respiratory: CTA bilaterally, no wheezing, no crackles, no rhonchi  Abdomen: Soft/+BS, non tender, non distended, no guarding; liquid stool noted in the ostomy   Extremities: trace LE edema, No lymphangitis, No petechiae, No rashes, no synovitis  Data Reviewed: Basic Metabolic Panel:  Recent Labs Lab 10/04/15 1222 10/05/15 0453 10/07/15 0316 10/08/15 0324  NA 134* 135 136 133*  K 4.6 4.3 3.1* DELTA CHECK NOTED  CL 96* 100* 100* 95*  CO2 28 26 26 27   GLUCOSE 127* 101* 90 129*  BUN 17 16 10 7   CREATININE 1.04* 0.82 0.87 0.83  CALCIUM 9.1 8.3* 8.0* 8.7*  MG  --  1.8 1.6*  --  PHOS  --  3.8  --   --    Liver Function Tests:  Recent Labs Lab 10/04/15 1222 10/05/15 0453  AST 17 15  ALT 14 11*  ALKPHOS 87 77  BILITOT 0.6 0.7  PROT 6.4* 5.4*  ALBUMIN 3.5 3.0*    Recent Labs Lab 10/04/15 1222  LIPASE 22   No results for input(s): AMMONIA  in the last 168 hours. CBC:  Recent Labs Lab 10/04/15 1222 10/05/15 0453 10/06/15 0321 10/07/15 0316  WBC 6.4 3.1* 4.0 6.3  HGB 13.5 12.6 11.9* 12.7  HCT 39.9 38.6 35.5* 37.4  MCV 91.7 95.1 93.2 92.3  PLT 245 237 193 249   Cardiac Enzymes: No results for input(s): CKTOTAL, CKMB, CKMBINDEX, TROPONINI in the last 168 hours. BNP: Invalid input(s): POCBNP CBG: No results for input(s): GLUCAP in the last 168 hours.  Recent Results (from the past 240 hour(s))  MRSA PCR Screening     Status: None   Collection Time: 10/05/15  6:47 PM  Result Value Ref Range Status   MRSA by PCR NEGATIVE NEGATIVE Final    Comment:        The GeneXpert MRSA Assay (FDA approved for NASAL specimens only), is one component of a comprehensive MRSA colonization surveillance program. It is not intended to diagnose MRSA infection nor to guide or monitor treatment for MRSA infections.      Scheduled Meds: . digoxin  0.125 mg Intravenous Daily  . diltiazem  360 mg Oral q morning - 10a  . enoxaparin (LOVENOX) injection  40 mg Subcutaneous Q24H   Continuous Infusions:    Xochitl Egle, DO  [facility] Pager [phone]  If 7PM-7AM, please contact night-coverage [email or web address] Password [email or web address] 10/08/2015, 8:17 AM   LOS: 4 days

## 2015-10-08 NOTE — Progress Notes (Signed)
Subjective:  Pt does not feel well- nausea after eating  Objective:  Vital Signs in the last 24 hours: Temp:  [97.4 F (36.3 C)-98 F (36.7 C)] 97.5 F (36.4 C) (03/15 1155) Resp:  [16-21] 20 (03/15 1000) BP: (143-165)/(83-99) 145/89 mmHg (03/15 1000) SpO2:  [95 %-97 %] 96 % (03/15 1000)  Intake/Output from previous day:  Intake/Output Summary (Last 24 hours) at 10/08/15 1337 Last data filed at 10/08/15 0815  Gross per 24 hour  Intake    272 ml  Output    450 ml  Net   -178 ml    Physical Exam: General appearance: alert, cooperative and no distress Lungs: clear to auscultation bilaterally Heart: regular rate and rhythm and increased rate Abdomen: a little distended, BS diminnished, colostomy Lt   Rate: 110  Rhythm: sinus tachycardia  Lab Results:  Recent Labs  10/06/15 0321 10/07/15 0316  WBC 4.0 6.3  HGB 11.9* 12.7  PLT 193 249    Recent Labs  10/07/15 0316 10/08/15 0324  NA 136 133*  K 3.1* 4.0  CL 100* 95*  CO2 26 27  GLUCOSE 90 129*  BUN 10 7  CREATININE 0.87 0.83   No results for input(s): TROPONINI in the last 72 hours.  Invalid input(s): CK, MB No results for input(s): INR in the last 72 hours.  Scheduled Meds: . digoxin  0.125 mg Intravenous Daily  . diltiazem  360 mg Oral q morning - 10a  . enoxaparin (LOVENOX) injection  40 mg Subcutaneous Q24H   Continuous Infusions:  PRN Meds:.acetaminophen **OR** acetaminophen, hydrALAZINE, morphine injection, ondansetron **OR** ondansetron (ZOFRAN) IV   Imaging: Imaging results have been reviewed Abd XR pending   Assessment/Plan:  82 y/o female with a history of atrial flutter and atrial fibrillation. She has had prior RFA in 2010. She had a ST Jude pacemaker implanted in 2010 as well. She has no history of CAD or MI. Echo in Nov 2016 showed normal LVF-EF 60-65%. 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 She has been unable to tolerate  anticoagulation (CHADs VASc=4). She was recently admitted for bladder surgery 09/29/15- TURBT. She was discharged 09/30/15 and came back to the ED 10/04/15 with nausea and vomiting. She was diagnosed with SBO and has been treated conservatively. We were asked to see for help with her AF with RVR.  She appeared to be improving yesterday and was transitioned to po medications but the pt and family feel she is worse today.   Principal Problem:   SBO (small bowel obstruction) ([place]) Active Problems:   Atrial flutter with rapid ventricular response (HCC)   PAF- fib and flutter   Cancer involving bladder by direct extension from ovary [facility])   S/P St Jude Pacer Feb 2006 after failed RFA   Dehydration   Hyponatremia   Unspecified protein-calorie malnutrition (HCC)   RBBB   PLAN:  Abdominal films ordered, check EKG. Switch back to po medications if she is obstructed again.   Kerin Ransom PA-C 10/08/2015, 1:37 PM [phone]  Attending Note:   The patient was seen and examined.  Agree with assessment and plan as noted above.  Changes made to the above note as needed.  She is doing well Its is not unusual that she would have transient episodes of tachycardia during this hospitalization ( volume depleted from SBO, pain related to SBO,   Declining nutritional status)  Continue with meds,   Change to IV when she  is unable to take PO  No new recs Will sign off. Call for questions   Ramond Dial., MD, [facility] 10/08/2015, 2:40 PM [address],  [place] Pager [phone]

## 2015-10-08 NOTE — Progress Notes (Signed)
Subjective: Pt reports a decrease in ostomy output, got K+ yesterday and had some solid food yesterday.  She complains she has felt sick since that time.  She has had some mucus from her rectum x 3, and feels better.  Biggest issue is she feel blown up again and "yucky."  Very little stool in ostomy bag, she emptied it earlier this AM.  There is some liquid stool and a little gas in the ostomy bag right now.    Objective: Vital signs in last 24 hours: Temp:  [97.4 F (36.3 C)-98 F (36.7 C)] 97.5 F (36.4 C) (03/15 1155) Resp:  [16-26] 20 (03/15 1000) BP: (143-165)/(83-99) 145/89 mmHg (03/15 1000) SpO2:  [95 %-98 %] 96 % (03/15 1000) Last BM Date: 10/07/15 222 PO Diet:  Heart healthy Afebrile, VSS Labs OK Film 10/10/15 shows resolution of SBO Intake/Output from previous day: 03/14 0701 - 03/15 0700 In: 272 [P.O.:222; IV Piggyback:50] Out: 350 [Urine:250; Stool:100] Intake/Output this shift: Total I/O In: -  Out: 100 [Urine:100]  General appearance: alert, cooperative and no distress GI: she is distended, sitting up in the chair, BS hypoactive.  no nausea or vomiting.  She is not tender.  Lab Results:   Recent Labs  10/06/15 0321 10/07/15 0316  WBC 4.0 6.3  HGB 11.9* 12.7  HCT 35.5* 37.4  PLT 193 249    BMET  Recent Labs  10/07/15 0316 10/08/15 0324  NA 136 133*  K 3.1* 4.0  CL 100* 95*  CO2 26 27  GLUCOSE 90 129*  BUN 10 7  CREATININE 0.87 0.83  CALCIUM 8.0* 8.7*   PT/INR No results for input(s): LABPROT, INR in the last 72 hours.   Recent Labs Lab 10/04/15 1222 10/05/15 0453  AST 17 15  ALT 14 11*  ALKPHOS 87 77  BILITOT 0.6 0.7  PROT 6.4* 5.4*  ALBUMIN 3.5 3.0*     Lipase     Component Value Date/Time   LIPASE 22 10/04/2015 1222     Studies/Results: No results found.  Medications: . digoxin  0.125 mg Intravenous Daily  . diltiazem  360 mg Oral q morning - 10a  . enoxaparin (LOVENOX) injection  40 mg Subcutaneous Q24H     Assessment/Plan SBO/hx of Ovarian cancer with invasion into the rectum and then small bowel obstruction S/p 1 laparotomy with bypass, and from jejunum to jejunum, of pelvic mass, Stamm gastrostomy, descending loop colostomy 04/04/12, Dr. Donne Hazel 2.  Sigmoid colectomy,  Revision of colostomy to end colostomy and Hartman's pouch,10/03/12 Dr. Donne Hazel; Exploratory laparotomy, lysis of adhesions, infracolic omentectomy, bilateral salpingo-oophorectomy, resection of ileal nodule, optimal R0 tumor debulking, Dr. Imagene Gurney A. Alycia Rossetti, 10/03/12 Pelvic mass bladder mass with Exam under anesthesia, cystoscopy, TURBT and fulguration greater than 5 cm, 09/05/13, Dr. Junious Silk Radiation treatment for recurrent ovarian vs primary bladder cancer 09/05/13 Dr. Sondra Come:  completed 10/22/13 Chemotherapy Dr. Benay Spice Cystoscopy, TURBT of greater than 5 cm bladder tumor,06/27/14 Dr. Diona Fanti TURBT of large bladder tumor, 09/29/15, Dr. Diona Fanti Seen 10/04/15 and readmitted with nausea, vomiting and SBO SBO resolved 10/07/15, with conservative management  AF with RVR Dehydration Hypertension/hypotensin Possible UTI Ovarian and bladder cancer Antibiotics:  Ceftriaxone x 3 days, completed 10/06/15 DVT:  Lovenox/SCD   Plan:  Going down for films we will follow with you.   Film shows :  Interval increase in gaseous small bowel dilatation, suggesting recurrent or evolving small bowel obstruction. DR. Tat has made her NPO and I agree.  She has not vomited so  far. I will talk with Dr. Marcello Moores before making a final recommendation.  I think she may end up with another NG tube placement.      LOS: 4 days    Victoria [name] 10/08/2015

## 2015-10-09 ENCOUNTER — Inpatient Hospital Stay (HOSPITAL_COMMUNITY): Payer: Medicare Other

## 2015-10-09 ENCOUNTER — Encounter (HOSPITAL_COMMUNITY)
Admission: EM | Disposition: A | Discharge status: Home / Self Care | Payer: Self-pay | Source: Home / Self Care | Attending: Internal Medicine

## 2015-10-09 ENCOUNTER — Inpatient Hospital Stay (HOSPITAL_COMMUNITY): Payer: Medicare Other | Admitting: Anesthesiology

## 2015-10-09 ENCOUNTER — Encounter (HOSPITAL_COMMUNITY): Payer: Self-pay | Admitting: Registered Nurse

## 2015-10-09 DIAGNOSIS — R1110 Vomiting, unspecified: Secondary | ICD-10-CM | POA: Insufficient documentation

## 2015-10-09 DIAGNOSIS — R112 Nausea with vomiting, unspecified: Secondary | ICD-10-CM

## 2015-10-09 DIAGNOSIS — I480 Paroxysmal atrial fibrillation: Secondary | ICD-10-CM

## 2015-10-09 DIAGNOSIS — E871 Hypo-osmolality and hyponatremia: Secondary | ICD-10-CM

## 2015-10-09 HISTORY — PX: LAPAROTOMY: SHX154

## 2015-10-09 LAB — BASIC METABOLIC PANEL
ANION GAP: 10 (ref 5–15)
BUN: 10 mg/dL (ref 6–20)
CHLORIDE: 94 mmol/L — AB (ref 101–111)
CO2: 24 mmol/L (ref 22–32)
Calcium: 8.2 mg/dL — ABNORMAL LOW (ref 8.9–10.3)
Creatinine, Ser: 0.76 mg/dL (ref 0.44–1.00)
Glucose, Bld: 118 mg/dL — ABNORMAL HIGH (ref 65–99)
Potassium: 4.7 mmol/L (ref 3.5–5.1)
Sodium: 128 mmol/L — ABNORMAL LOW (ref 135–145)

## 2015-10-09 LAB — CBC
HEMATOCRIT: 43.6 % (ref 36.0–46.0)
HEMOGLOBIN: 14.7 g/dL (ref 12.0–15.0)
MCH: 31.7 pg (ref 26.0–34.0)
MCHC: 33.7 g/dL (ref 30.0–36.0)
MCV: 94 fL (ref 78.0–100.0)
Platelets: 321 10*3/uL (ref 150–400)
RBC: 4.64 MIL/uL (ref 3.87–5.11)
RDW: 14.1 % (ref 11.5–15.5)
WBC: 8.5 10*3/uL (ref 4.0–10.5)

## 2015-10-09 SURGERY — LAPAROTOMY, EXPLORATORY
Anesthesia: General | Site: Abdomen

## 2015-10-09 MED ORDER — SUGAMMADEX SODIUM 200 MG/2ML IV SOLN
INTRAVENOUS | Status: DC | PRN
  Administered 2015-10-09: 130 mg via INTRAVENOUS

## 2015-10-09 MED ORDER — ONDANSETRON HCL 4 MG/2ML IJ SOLN
INTRAMUSCULAR | Status: DC | PRN
  Administered 2015-10-09: 4 mg via INTRAVENOUS

## 2015-10-09 MED ORDER — FENTANYL CITRATE (PF) 100 MCG/2ML IJ SOLN: 25.0000 ug | INTRAMUSCULAR | Status: DC | PRN

## 2015-10-09 MED ORDER — FENTANYL CITRATE (PF) 100 MCG/2ML IJ SOLN
INTRAMUSCULAR | Status: DC | PRN
Start: 2015-10-09 — End: 2015-10-09
  Administered 2015-10-09 (×4): 50 ug via INTRAVENOUS

## 2015-10-09 MED ORDER — ROCURONIUM BROMIDE 100 MG/10ML IV SOLN
INTRAVENOUS | Status: AC
  Filled 2015-10-09: qty 1

## 2015-10-09 MED ORDER — LIDOCAINE HCL (CARDIAC) 20 MG/ML IV SOLN
INTRAVENOUS | Status: AC
  Filled 2015-10-09: qty 5

## 2015-10-09 MED ORDER — SODIUM CHLORIDE 0.9 % IV SOLN
INTRAVENOUS | Status: DC
  Administered 2015-10-09 – 2015-10-11 (×4): via INTRAVENOUS

## 2015-10-09 MED ORDER — DEXTROSE 5 % IV SOLN
2.0000 g | INTRAVENOUS | Status: AC
  Administered 2015-10-09: 2 g via INTRAVENOUS
  Filled 2015-10-09: qty 2

## 2015-10-09 MED ORDER — PROPOFOL 10 MG/ML IV BOLUS
INTRAVENOUS | Status: DC | PRN
  Administered 2015-10-09: 20 mg via INTRAVENOUS
  Administered 2015-10-09: 100 mg via INTRAVENOUS
  Administered 2015-10-09: 30 mg via INTRAVENOUS

## 2015-10-09 MED ORDER — LIDOCAINE HCL (CARDIAC) 20 MG/ML IV SOLN
INTRAVENOUS | Status: DC | PRN
  Administered 2015-10-09: 80 mg via INTRAVENOUS

## 2015-10-09 MED ORDER — PHENYLEPHRINE HCL 10 MG/ML IJ SOLN
20.0000 mg | INTRAVENOUS | Status: DC | PRN
  Administered 2015-10-09: 15 ug/min via INTRAVENOUS

## 2015-10-09 MED ORDER — SUCCINYLCHOLINE CHLORIDE 20 MG/ML IJ SOLN
INTRAMUSCULAR | Status: DC | PRN
  Administered 2015-10-09: 100 mg via INTRAVENOUS

## 2015-10-09 MED ORDER — PHENYLEPHRINE HCL 10 MG/ML IJ SOLN
INTRAMUSCULAR | Status: DC | PRN
  Administered 2015-10-09: 80 ug via INTRAVENOUS

## 2015-10-09 MED ORDER — MEPERIDINE HCL 50 MG/ML IJ SOLN: 6.2500 mg | INTRAMUSCULAR | Status: DC | PRN

## 2015-10-09 MED ORDER — 0.9 % SODIUM CHLORIDE (POUR BTL) OPTIME
TOPICAL | Status: DC | PRN
  Administered 2015-10-09: 1000 mL

## 2015-10-09 MED ORDER — SODIUM CHLORIDE 0.9 % IV SOLN: INTRAVENOUS | Status: DC

## 2015-10-09 MED ORDER — ROCURONIUM BROMIDE 100 MG/10ML IV SOLN
INTRAVENOUS | Status: DC | PRN
  Administered 2015-10-09: 10 mg via INTRAVENOUS
  Administered 2015-10-09: 20 mg via INTRAVENOUS

## 2015-10-09 MED ORDER — LACTATED RINGERS IV SOLN
INTRAVENOUS | Status: DC | PRN
  Administered 2015-10-09: 13:00:00 via INTRAVENOUS

## 2015-10-09 MED ORDER — PROPOFOL 10 MG/ML IV BOLUS
INTRAVENOUS | Status: AC
  Filled 2015-10-09: qty 20

## 2015-10-09 MED ORDER — LACTATED RINGERS IV SOLN: INTRAVENOUS | Status: DC

## 2015-10-09 MED ORDER — DILTIAZEM HCL 100 MG IV SOLR
10.0000 mg/h | INTRAVENOUS | Status: DC
  Administered 2015-10-09: 5 mg/h via INTRAVENOUS
  Administered 2015-10-09: 7.5 mg/h via INTRAVENOUS
  Administered 2015-10-10 (×2): 10 mg/h via INTRAVENOUS
  Filled 2015-10-09 (×4): qty 100

## 2015-10-09 MED ORDER — SUGAMMADEX SODIUM 200 MG/2ML IV SOLN
INTRAVENOUS | Status: AC
  Filled 2015-10-09: qty 2

## 2015-10-09 MED ORDER — CEFOTETAN DISODIUM-DEXTROSE 2-2.08 GM-% IV SOLR
INTRAVENOUS | Status: AC
  Filled 2015-10-09: qty 50

## 2015-10-09 MED ORDER — FENTANYL CITRATE (PF) 250 MCG/5ML IJ SOLN
INTRAMUSCULAR | Status: AC
  Filled 2015-10-09: qty 5

## 2015-10-09 SURGICAL SUPPLY — 54 items
BLADE EXTENDED COATED 6.5IN (ELECTRODE) IMPLANT
BLADE HEX COATED 2.75 (ELECTRODE) ×1 IMPLANT
BLADE SURG SZ10 CARB STEEL (BLADE) ×1 IMPLANT
CHLORAPREP W/TINT 26ML (MISCELLANEOUS) ×3 IMPLANT
CLIP TI LARGE 6 (CLIP) IMPLANT
COVER MAYO STAND STRL (DRAPES) ×1 IMPLANT
COVER SURGICAL LIGHT HANDLE (MISCELLANEOUS) ×1 IMPLANT
DRAPE LAPAROSCOPIC ABDOMINAL (DRAPES) ×3 IMPLANT
DRAPE SHEET LG 3/4 BI-LAMINATE (DRAPES) IMPLANT
DRAPE UTILITY XL STRL (DRAPES) ×3 IMPLANT
DRAPE WARM FLUID 44X44 (DRAPE) ×3 IMPLANT
DRSG OPSITE POSTOP 4X8 (GAUZE/BANDAGES/DRESSINGS) ×2 IMPLANT
ELECT REM PT RETURN 9FT ADLT (ELECTROSURGICAL) ×3
ELECTRODE REM PT RTRN 9FT ADLT (ELECTROSURGICAL) ×1 IMPLANT
GAUZE SPONGE 4X4 12PLY STRL (GAUZE/BANDAGES/DRESSINGS) ×1 IMPLANT
GLOVE BIO SURGEON STRL SZ 6.5 (GLOVE) ×2 IMPLANT
GLOVE BIO SURGEONS STRL SZ 6.5 (GLOVE) ×1
GLOVE BIOGEL PI IND STRL 7.0 (GLOVE) ×1 IMPLANT
GLOVE BIOGEL PI INDICATOR 7.0 (GLOVE) ×2
GOWN L4 XXLG W/PAP TWL (GOWN DISPOSABLE) ×3 IMPLANT
GOWN STRL REUS W/ TWL XL LVL3 (GOWN DISPOSABLE) IMPLANT
GOWN STRL REUS W/TWL XL LVL3 (GOWN DISPOSABLE) ×6
HANDLE SUCTION POOLE (INSTRUMENTS) ×1 IMPLANT
KIT BASIN OR (CUSTOM PROCEDURE TRAY) ×3 IMPLANT
LEGGING LITHOTOMY PAIR STRL (DRAPES) IMPLANT
LIGASURE IMPACT 36 18CM CVD LR (INSTRUMENTS) IMPLANT
NS IRRIG 1000ML POUR BTL (IV SOLUTION) ×4 IMPLANT
PACK GENERAL/GYN (CUSTOM PROCEDURE TRAY) ×5 IMPLANT
POUCH DRAINABLE 1PC 2 1/4 FLAT (OSTOMY) ×2 IMPLANT
SHEARS HARMONIC ACE PLUS 36CM (ENDOMECHANICALS) IMPLANT
STAPLER VISISTAT 35W (STAPLE) ×1 IMPLANT
SUCTION POOLE HANDLE (INSTRUMENTS)
SUT NOV 1 T60/GS (SUTURE) IMPLANT
SUT NOVA NAB DX-16 0-1 5-0 T12 (SUTURE) ×6 IMPLANT
SUT NOVA NAB GS-21 0 18 T12 DT (SUTURE) ×5 IMPLANT
SUT NOVA NAB GS-22 2 0 T19 (SUTURE) ×1 IMPLANT
SUT PDS AB 1 CTX 36 (SUTURE) IMPLANT
SUT PDS AB 1 TP1 96 (SUTURE) IMPLANT
SUT SILK 2 0 (SUTURE)
SUT SILK 2 0 SH CR/8 (SUTURE) IMPLANT
SUT SILK 2 0SH CR/8 30 (SUTURE) IMPLANT
SUT SILK 2-0 18XBRD TIE 12 (SUTURE) ×2 IMPLANT
SUT SILK 2-0 30XBRD TIE 12 (SUTURE) ×2 IMPLANT
SUT SILK 3 0 (SUTURE)
SUT SILK 3 0 SH CR/8 (SUTURE) IMPLANT
SUT SILK 3-0 18XBRD TIE 12 (SUTURE) IMPLANT
SUT VIC AB 2-0 SH 18 (SUTURE) ×2 IMPLANT
SUT VIC AB 3-0 54XBRD REEL (SUTURE) IMPLANT
SUT VIC AB 3-0 BRD 54 (SUTURE)
SUT VIC AB 4-0 PS2 27 (SUTURE) ×2 IMPLANT
TOWEL OR 17X26 10 PK STRL BLUE (TOWEL DISPOSABLE) ×3 IMPLANT
TRAY FOLEY W/METER SILVER 14FR (SET/KITS/TRAYS/PACK) ×3 IMPLANT
TRAY FOLEY W/METER SILVER 16FR (SET/KITS/TRAYS/PACK) ×1 IMPLANT
YANKAUER SUCT BULB TIP NO VENT (SUCTIONS) ×1 IMPLANT

## 2015-10-09 NOTE — Progress Notes (Signed)
PROGRESS NOTE  Victoria [name] [id number] DOB: 05-Feb-1927 DOA: 10/04/2015 PCP: Donnajean Lopes, MD  Hospital course: 82 year old female patient with history of ovarian cancer, status post chemotherapy with Taxol/carboplatin in 2014, s/p TURBT and fulguration of high-grade carcinoma & s/p palliative XRT, s/p TUR of a high-grade poorly differentiated carcinoma right dome of the bladder, colostomy, paroxysmal atrial flutter off anticoagulation, SSS S/p PPM, anemia, HLD, HTN, recent hospitalization 09/29/15-10/03/15 for resection of bladder tumor, presented to ED with nausea and vomiting of 2 days' duration and admitted for SBO. General surgery consulted. Treated conservatively with bowel rest and NG tube decompression. SBO resolved & surgery planning to advance diet. Transferred to SDU on 3/12 due to rapid A. fib/flutter-reverted to sinus rhythm after digoxin load greater than and paroxysmal A. fib and placed on IV Cardizem drip at 5 mcg and IV digoxin. Cardiology consulted. SBO resolved. Starting back home dose of oral Cardizem CD and will discontinue Cardizem drip. Received today's dose of digoxin, considered discontinuing 3/15.   Assessment & Plan:  Paroxysmal atrial flutter/fib with RVR - Patient is on Cardizem CD 360 mg daily at home which she was unable to tolerate since 3/9 due to SBO symptoms. - On 10/05/15 afternoon, she went into symptomatic atrial flutter with RVR & 2:1 block. She was transferred to stepdown unit. Attempted to start Cardizem drip but was hypotensive (may even have been rate related) and hence not continued. Case was discussed with cardiologist on-call and started on digoxin load. - Patient reverted to sinus rhythm at approximately 4 AM on 3/13 but then had paroxysmal A. fib.  -Cardiology input appreciated. Patient was placed on IV Cardizem drip at 5 g and continued on IV digoxin. - SBO resolved. Resume home dose of oral Cardizem CD 360 daily and discontinue  Cardizem drip 2 hours later. Has received today's dose of IV digoxin-reassess to discontinue on 3/15. Discussed with cardiology. - CHADs VASc=4). Not candidate for anticoagulation secondary to hematuria from ovarian cancer with bladder matter stasis. Has been off of Coumadin for approximately 2 years. -pt states she has "swelling" with pindolol--defer use of metoprolol to cardiology -continue digoxin IV for now -10/09/15--restart IV diltiazem as pt unable to take PO due to SBO   SBO - May be from ? Internal hernia or volvulus. - Gen. surgery consulted and patient managed conservatively with bowel rest, NG tube decompression, IV fluids, serial exams and x-rays. - SBO resolved. Advance diet. Surgery signed off 3/14. -10/08/15--abdominal x-ray revealed small bowel obstruction--NG placed; surgery reconsulted -10/09/15-case discussed with Surgery, Dr. Jacinto Reap to go to surgery -pt is medically stable for surgery presently  Dehydration - Secondary to GI losses -Restarted intravenous fluids secondary to NPO -remove K from maintenance IV fluids secondary to potassium trending up   Essential hypertension/hypotension - Briefly developed hypotension overnight 3/12, may be rate related from A. fib with RVR.  -Resolved. Now hypertensive again  Ovarian cancer - Outpatient follow-up with oncology  Possible UTI - Patient had completed course of Keflex after recent bladder surgery. Urine microscopy on admission showed many bacteria and WBCs and RBCs. She was empirically started on IV Rocephin but urine cultures were never sent. No dysuria or urinary frequency. Hematuria improved. Completed 3 days of IV Rocephin. Discontinue antibiotics.  Bladder Cancer - recent surgery  Anemia - Hemoglobin normal   DVT prophylaxis: Lovenox Code Status: DO NOT RESUSCITATE Family Communication: Granddaughter updated at bedside 10/09/15 Disposition Plan: Continue monitoring in stepdown  unit   Consultants:  General surgery-signed off  Cardiology  Procedures:  NG tube decompression-discontinued  Antimicrobials:  IV Rocephin 3/11 > 3/13    Procedures/Studies: Dg Abd 1 View  10/09/2015  CLINICAL DATA:  SBO. Abdominal pain and distention. EXAM: ABDOMEN - 1 VIEW COMPARISON:  10/08/2015 FINDINGS: A nasogastric tube is in place, or back upon itself vomits tip redirected into the esophagus and not imaged. There is dilatation of numerous small bowel loops. Paucity of large bowel gas. No evidence for free intraperitoneal air. Elevation of left hemidiaphragm again noted. IMPRESSION: 1. Small bowel obstruction. 2. No evidence for free air. 3. Nasogastric tube redirected into the lower esophagus and needs to be repositioned. 4. Elevated left hemidiaphragm. These results will be called to the ordering clinician or representative by the Radiologist Assistant, and communication documented in the PACS or zVision Dashboard. Electronically Signed   By: Nolon Nations M.D.   On: 10/09/2015 09:23   Ct Chest Wo Contrast  09/25/2015  CLINICAL DATA:  History of ovarian cancer diagnosed in 2013 and history of bladder cancer, presenting with right posterior chest wall pain. History of "rib removed in 1934" per EPIC. EXAM: CT CHEST WITHOUT CONTRAST TECHNIQUE: Multidetector CT imaging of the chest was performed following the standard protocol without IV contrast. COMPARISON:  08/31/2013 CT abdomen/ pelvis. 03/22/2012 PET-CT. No prior chest CT. 09/03/2013 chest radiograph. FINDINGS: Mediastinum/Nodes: Stable mild cardiomegaly. Two lead left subclavian pacemaker is noted with lead tips in the right atrium and right ventricular apex. No pericardial fluid/thickening. Coronary atherosclerosis. Great vessels are normal in course and caliber. Normal visualized thyroid. Normal esophagus. No pathologically enlarged axillary, mediastinal or gross hilar lymph nodes, noting limited sensitivity for the detection  of hilar adenopathy on this noncontrast study. Lungs/Pleura: No pneumothorax. No pleural effusion. There is a 1.8 x 0.9 cm tracheal diverticulum at the right posterior trachea at the level of the thoracic inlet (series 5/image 9). There are postsurgical changes in the right lower lobe suggestive of wedge resection. There is varicoid and cystic bronchiectasis with associated parenchymal bands, architectural distortion and mild pleural thickening adjacent to surgical sutures in the superior peripheral right lower lobe. These findings are not appreciably changed compared to the 03/22/2012 PET-CT study. Calcified subcentimeter granuloma in the left upper lobe. No acute consolidative airspace disease, significant pulmonary nodules or lung masses. Upper abdomen: New 3 mm calcification in the lower left kidney, which may represent a nonobstructing left renal stone. Musculoskeletal: No aggressive appearing focal osseous lesions. Stable healed deformity in the posterior right ninth and tenth ribs. Moderate degenerative changes in the thoracic spine. IMPRESSION: 1. No acute abnormality. Stable scarring and bronchiectasis at the site of prior right lower lobe lung surgery. Stable healed deformity in the posterior right ninth and tenth ribs. 2. Stable mild cardiomegaly without pulmonary edema. Coronary atherosclerosis. 3. New 3 left renal calcification, possibly a nonobstructing stone. Electronically Signed   By: Ilona Sorrel M.D.   On: 09/25/2015 14:31   Ct Abdomen Pelvis W Contrast  10/04/2015  CLINICAL DATA:  History of bladder cancer, bladder tumor removed on Monday. Abdominal pain, vomiting. EXAM: CT ABDOMEN AND PELVIS WITH CONTRAST TECHNIQUE: Multidetector CT imaging of the abdomen and pelvis was performed using the standard protocol following bolus administration of intravenous contrast. CONTRAST:  64mL OMNIPAQUE IOHEXOL 300 MG/ML SOLN, 72mL OMNIPAQUE IOHEXOL 300 MG/ML SOLN COMPARISON:  08/31/2013 FINDINGS: Lower  chest: There is cardiomegaly. Pacer wires noted in the right heart. Coronary artery calcifications present. Bilateral lower  lobe atelectasis. No pleural effusions. Hepatobiliary: Unremarkable appearance Pancreas: Atrophic.  No mass appear Spleen: Unremarkable appearance Adrenals/Urinary Tract: Areas of scarring in the kidneys bilaterally. Small cysts in the right kidney. No hydronephrosis. Adrenal glands are unremarkable. Bladder is decompressed. Within the right lower pelvis, there is a 5.2 x 4.9 cm rim enhancing mass with central low-density and gas. This likely reflects a residual tumor. Stomach/Bowel: Patient is status post left lower quadrant colostomy. Extensive diverticulosis throughout the colon. There is small bowel dilatation throughout the abdomen and into the pelvis. Distal small bowel is decompressed. In addition, there is swirled appearance of the mesenteric vessels centrally. This is concerning for internal hernia or low less likely volvulus as the cause of the obstruction. There is moderate free fluid in the pelvis. Small amount of free fluid around the spleen and liver. Free fluid also noted within the left lower quadrant ostomy site. Vascular/Lymphatic: Aorta is heavily calcified, non aneurysmal. No adenopathy. Reproductive: Prior hysterectomy.  No adnexal masses. Other: No free air Musculoskeletal: Diffuse degenerative changes throughout the lumbar spine. IMPRESSION: Small bowel obstruction. There is a swirled appearance of the central mesenteric vessels suggesting the cause of the obstruction is internal hernia or less likely volvulus. Associated free fluid in the abdomen and pelvis. Left lower quadrant ostomy with extensive diverticular disease throughout the decompressed colon. Peripherally enhancing mass within the lower right pelvis, likely residual bladder wall tumor. Cardiomegaly, coronary artery disease. Bibasilar atelectasis. Electronically Signed   By: Rolm Baptise M.D.   On: 10/04/2015  17:05   Dg Abd 2 Views  10/08/2015  CLINICAL DATA:  Nausea and abdominal distention since yesterday. EXAM: ABDOMEN - 2 VIEW COMPARISON:  10/06/2015 FINDINGS: Right set up decubitus film shows no evidence for intraperitoneal free air. Supine film show diffuse gaseous bowel distension with small bowel loops measuring up to 4.6 cm in diameter. A more distended loop of the right abdomen may be related to a small bowel anastomosis. Imaging features are similar to the study from 10/05/2015. IMPRESSION: Interval increase in gaseous small bowel dilatation, suggesting recurrent or evolving small bowel obstruction. Electronically Signed   By: Misty Stanley M.D.   On: 10/08/2015 14:45   Dg Abd 2 Views  10/06/2015  CLINICAL DATA:  Follow-up small bowel obstruction. EXAM: ABDOMEN - 2 VIEW COMPARISON:  10/05/2015 abdominal radiographs. FINDINGS: Enteric tube loops in the proximal stomach and terminates in the distal stomach. No residual dilated small bowel loops or air-fluid levels. No evidence of pneumatosis or pneumoperitoneum. No pathologic soft tissue calcifications. Pacemaker leads overlie the right atrium and right ventricular apex. Marked degenerative changes in the visualized thoracolumbar spine. IMPRESSION: Nonobstructive bowel gas pattern, with no residual dilated small bowel loops. Electronically Signed   By: Ilona Sorrel M.D.   On: 10/06/2015 09:16   Dg Abd 2 Views  10/05/2015  CLINICAL DATA:  Small bowel obstruction EXAM: ABDOMEN - 2 VIEW COMPARISON:  CT 10/04/2015 FINDINGS: NG tube is in the stomach. Continued dilated small bowel loops with air-fluid levels, likely not significantly changed when compared to the scout film on prior CT. No free air organomegaly. IMPRESSION: Continued small bowel obstruction pattern.  No real change. Electronically Signed   By: Rolm Baptise M.D.   On: 10/05/2015 10:18         Subjective: Patient continues to complain of nausea without emesis. She denies any abdominal  pain, fevers, chills, chest pain with respect, headache, neck pain. No rashes. Denies any dysuria or hematuria. Making a  small amount of liquid stool in the ostomy.   Objective: Filed Vitals:   10/09/15 1015 10/09/15 1030 10/09/15 1045 10/09/15 1100  BP: 164/97 162/91 151/91 138/72  Pulse:      Temp:      TempSrc:      Resp: 19 16 18 21   Height:      Weight:      SpO2: 96% 95% 95% 96%    Intake/Output Summary (Last 24 hours) at 10/09/15 1116 Last data filed at 10/09/15 0605  Gross per 24 hour  Intake 776.25 ml  Output    970 ml  Net -193.75 ml   Weight change:  Exam:   General:  Pt is alert, follows commands appropriately, not in acute distress  HEENT: No icterus, No thrush, No neck mass, [place]/AT  Cardiovascular: RRR, S1/S2, no rubs, no gallops  Respiratory: Diminished breath sounds but clear to auscultation without wheezing.  Abdomen: Soft/+BS, non tender, non distended, no guarding; small liquid stool in the ostomy. No hepatosplenomegaly.   Extremities: No edema, No lymphangitis, No petechiae, No rashes, no synovitis  Data Reviewed: Basic Metabolic Panel:  Recent Labs Lab 10/04/15 1222 10/05/15 0453 10/07/15 0316 10/08/15 0324 10/09/15 0322  NA 134* 135 136 133* 128*  K 4.6 4.3 3.1* 4.0 4.7  CL 96* 100* 100* 95* 94*  CO2 28 26 26 27 24   GLUCOSE 127* 101* 90 129* 118*  BUN 17 16 10 7 10   CREATININE 1.04* 0.82 0.87 0.83 0.76  CALCIUM 9.1 8.3* 8.0* 8.7* 8.2*  MG  --  1.8 1.6*  --   --   PHOS  --  3.8  --   --   --    Liver Function Tests:  Recent Labs Lab 10/04/15 1222 10/05/15 0453  AST 17 15  ALT 14 11*  ALKPHOS 87 77  BILITOT 0.6 0.7  PROT 6.4* 5.4*  ALBUMIN 3.5 3.0*    Recent Labs Lab 10/04/15 1222  LIPASE 22   No results for input(s): AMMONIA in the last 168 hours. CBC:  Recent Labs Lab 10/04/15 1222 10/05/15 0453 10/06/15 0321 10/07/15 0316 10/09/15 0322  WBC 6.4 3.1* 4.0 6.3 8.5  HGB 13.5 12.6 11.9* 12.7 14.7  HCT 39.9 38.6  35.5* 37.4 43.6  MCV 91.7 95.1 93.2 92.3 94.0  PLT 245 237 193 249 321   Cardiac Enzymes: No results for input(s): CKTOTAL, CKMB, CKMBINDEX, TROPONINI in the last 168 hours. BNP: Invalid input(s): POCBNP CBG: No results for input(s): GLUCAP in the last 168 hours.  Recent Results (from the past 240 hour(s))  MRSA PCR Screening     Status: None   Collection Time: 10/05/15  6:47 PM  Result Value Ref Range Status   MRSA by PCR NEGATIVE NEGATIVE Final    Comment:        The GeneXpert MRSA Assay (FDA approved for NASAL specimens only), is one component of a comprehensive MRSA colonization surveillance program. It is not intended to diagnose MRSA infection nor to guide or monitor treatment for MRSA infections.      Scheduled Meds: . cefoTEtan (CEFOTAN) IV  2 g Intravenous On Call to OR  . digoxin  0.125 mg Intravenous Daily  . enoxaparin (LOVENOX) injection  40 mg Subcutaneous Q24H   Continuous Infusions: . diltiazem (CARDIZEM) infusion 5 mg/hr (10/09/15 0928)  . sodium chloride 0.9 % 1,000 mL infusion       Micheil Klaus, DO  [facility] Pager [phone]  If 7PM-7AM, please contact night-coverage  [email or web address] Password [email or web address] 10/09/2015, 11:17 AM   LOS: 5 days

## 2015-10-09 NOTE — Anesthesia Preprocedure Evaluation (Addendum)
Anesthesia Evaluation  Patient identified by MRN, date of birth, ID band Patient awake    Reviewed: Allergy & Precautions, NPO status , Patient's Chart, lab work & pertinent test results  Airway Mallampati: II   Neck ROM: Full    Dental  (+) Teeth Intact, Dental Advisory Given   Pulmonary neg pulmonary ROS,    breath sounds clear to auscultation       Cardiovascular hypertension, Pt. on medications + dysrhythmias Atrial Fibrillation  Rhythm:Regular Rate:Normal     Neuro/Psych negative neurological ROS  negative psych ROS   GI/Hepatic negative GI ROS, Neg liver ROS,   Endo/Other  negative endocrine ROS  Renal/GU negative Renal ROS  negative genitourinary   Musculoskeletal negative musculoskeletal ROS (+)   Abdominal   Peds negative pediatric ROS (+)  Hematology   Anesthesia Other Findings   Reproductive/Obstetrics negative OB ROS                           Lab Results  Component Value Date   WBC 8.5 10/09/2015   HGB 14.7 10/09/2015   HCT 43.6 10/09/2015   MCV 94.0 10/09/2015   PLT 321 10/09/2015   Lab Results  Component Value Date   CREATININE 0.76 10/09/2015   BUN 10 10/09/2015   NA 128* 10/09/2015   K 4.7 10/09/2015   CL 94* 10/09/2015   CO2 24 10/09/2015   Lab Results  Component Value Date   INR 1.15 09/04/2013   INR 1.69* 09/03/2013   INR 2.64* 09/02/2013   PROTIME 26.4* 12/05/2012   PROTIME 24.0* 11/21/2012   PROTIME 27.6* 10/31/2012   10/06/15 EKG: normal sinus rhythm.  05/2015: Echo - Left ventricle: The cavity size was normal. Wall thickness was increased in a pattern of mild LVH. Systolic function was normal. The estimated ejection fraction was in the range of 60% to 65%. Wall motion was normal; there were no regional wall motion abnormalities. The study is not technically sufficient to allow evaluation of LV diastolic function. - Mitral valve: Mildly thickened  leaflets . There was mild regurgitation. - Left atrium: The atrium was normal in size. - Right ventricle: Pacer wire or catheter noted in right ventricle. - Right atrium: Mildly dilated at 21 cm2. Pacer wire or catheter noted in right atrium. - Tricuspid valve: There was moderate regurgitation. - Pulmonary arteries: PA peak pressure: 26 mm Hg (S). - Inferior vena cava: The vessel was normal in size. The respirophasic diameter changes were in the normal range (>= 50%), consistent with normal central venous pressure.    Anesthesia Physical Anesthesia Plan  ASA: III  Anesthesia Plan: General   Post-op Pain Management:    Induction: Intravenous  Airway Management Planned: Oral ETT  Additional Equipment: Arterial line  Intra-op Plan:   Post-operative Plan: Possible Post-op intubation/ventilation  Informed Consent: I have reviewed the patients History and Physical, chart, labs and discussed the procedure including the risks, benefits and alternatives for the proposed anesthesia with the patient or authorized representative who has indicated his/her understanding and acceptance.   Dental advisory given  Plan Discussed with: CRNA  Anesthesia Plan Comments: (Ms. Angeloff and family are agreeable to suspension of DNR.   Anesthetic plan discussed in detail. Associated risk discussed including but not limited to life threatening cardiovascular, pulmonary events and dental damage. The postoperative pain management and antiemetic plan discussed with patient. All questions answered in detail. Patient is in agreement.   )  Anesthesia Quick Evaluation  

## 2015-10-09 NOTE — Progress Notes (Signed)
10/09/15  [id number]  Paged MD regarding BP 182/102. Will continue to monitor.

## 2015-10-09 NOTE — Op Note (Signed)
10/04/2015 - 10/09/2015  2:34 PM  PATIENT:  Victoria [name]  82 y.o. female  Patient Care Team: Leanna Battles, MD as PCP - General (Internal Medicine)  PRE-OPERATIVE DIAGNOSIS:  Small bowel obstruction  POST-OPERATIVE DIAGNOSIS:  Small Bowel Obstruction, internal hernia  PROCEDURE: EXPLORATORY LAPAROTOMY, LYSIS OF ADHESIONS   Surgeon(s): Leighton Ruff, MD  ASSISTANT: none   ANESTHESIA:   general  EBL: 24ml  Total I/O In: 1368.8 [I.V.:1368.8] Out: 300 [Urine:250; Stool:25; Blood:25]  DRAINS: none   SPECIMEN:  No Specimen  DISPOSITION OF SPECIMEN:  N/A  COUNTS:  YES  PLAN OF CARE: patient admitted  PATIENT DISPOSITION:  PACU - hemodynamically stable.  INDICATION: A 82 year old female who presents to the hospital with a small bowel obstruction. This initially resolve with NG decompression but recurred approximately 24 hours later. It was recommended that she proceed to the OR for exploratory laparotomy and lysis of adhesions.   OR FINDINGS: Internal hernia due to adhesive band around mesentery  DESCRIPTION: the patient was identified in the preoperative holding area and taken to the OR where they were laid supine on the operating room table.  General anesthesia was induced without difficulty. SCDs were also noted to be in place prior to the initiation of anesthesia.  The patient was then prepped and draped in the usual sterile fashion.   A surgical timeout was performed indicating the correct patient, procedure, positioning and need for preoperative antibiotics.   The patient's colostomy site was covered. I made an incision around the umbilicus with a 10 blade scalpel. Dissection was carried down through the fascia using electrocautery. The peritoneum was entered bluntly. I then divided the rest of the peritoneum using artery. I eviscerated her small bowel and identified an area of blockage. This was around the root of her mesentery causing an internal hernia. The band was  cut and the bowel was released. I then ran her entire small bowel from the terminal ileum to the ligament of Treitz. 2 other adhesive bands were transected but did not appear to be causing blockage. There was no other signs of obstruction noted after these were fixed. The stomach was adherent to the abdominal wall from the previous gastrotomy site. I palpated the NG tube in correct position in the stomach. The remaining portion of the abdomen was palpated with no other acute findings. I evaluated the stoma site. There was a small opening next to the colon approximately one finger breadth in diameter. This did not appear to be a hernia and therefore I decided to leave this alone. The fascia was then closed using #1 interrupted Novafil sutures. The subcutaneous tissue was reapproximated using 2-0 Vicryl sutures. The skin was closed using a running 4-0 subcuticular Vicryl suture. A sterile dressing was placed. An ostomy appliance was replaced. The patient was awakened from anesthesia and sent to the postanesthesia care unit in stable condition. All counts were correct per operating room staff.

## 2015-10-09 NOTE — Transfer of Care (Signed)
Immediate Anesthesia Transfer of Care Note  Patient: Victoria [name]  Procedure(s) Performed: Procedure(s): EXPLORATORY LAPAROTOMY LYSIS OF ADHESIONS (N/A)  Patient Location: PACU  Anesthesia Type:General  Level of Consciousness: awake, alert , oriented and patient cooperative  Airway & Oxygen Therapy: Patient Spontanous Breathing and Patient connected to face mask oxygen  Post-op Assessment: Report given to RN, Post -op Vital signs reviewed and stable and Patient moving all extremities  Post vital signs: Reviewed and stable  Last Vitals:  Filed Vitals:   10/09/15 1045 10/09/15 1100  BP: 151/91 138/72  Pulse:    Temp:    Resp: 18 21    Complications: No apparent anesthesia complications

## 2015-10-09 NOTE — Progress Notes (Signed)
PT Cancellation Note  Patient Details Name: Victoria [name] MRN: [id number] DOB: 02-26-1927   Cancelled Treatment:    Reason Eval/Treat Not Completed: Patient at procedure or test/unavailable. Will check back another day   Weston Anna, MPT Pager: [phone]

## 2015-10-09 NOTE — Anesthesia Procedure Notes (Addendum)
Procedure Name: Intubation Date/Time: 10/09/2015 1:13 PM Performed by: Carleene Cooper A Pre-anesthesia Checklist: Patient identified, Emergency Drugs available, Suction available, Timeout performed and Patient being monitored Patient Re-evaluated:Patient Re-evaluated prior to inductionOxygen Delivery Method: Circle system utilized Preoxygenation: Pre-oxygenation with 100% oxygen Intubation Type: Cricoid Pressure applied, Rapid sequence and IV induction Laryngoscope Size: Mac and 3 Grade View: Grade I Tube type: Subglottic suction tube Tube size: 7.5 mm Number of attempts: 1 Airway Equipment and Method: Stylet Placement Confirmation: ETT inserted through vocal cords under direct vision,  breath sounds checked- equal and bilateral and positive ETCO2 Secured at: 21 cm Tube secured with: Tape Dental Injury: Teeth and Oropharynx as per pre-operative assessment    Anesthesia Procedure Note A Line Arrow Pen  Left Radial Arterial Line Placement: Chlorhexidine prep, 1% Lidocaine skin wheal, # 22 Ga. Arrow TW introducer needle inserted into the patient's L radial artery, wire advanced, and # 20 Ga. 4.45 cm. Arrow catheter advanced over the wire and into the radial artery without difficulty.  The needle and wire were withdrawn and there was good blood flow. The catheter was connected via previously flushed high Durometer tubing to the transducer.  Blood was aspirated from the artery and tubing via the stopcock at the transducer.  The line and catheter was then flushed with NS.  Arterial catheter secured with a StatLock stabilization device.  2.5 cm BioPatch disc and sterile Tegaderm dressing applied.  No complications.

## 2015-10-09 NOTE — Progress Notes (Signed)
Subjective: Pt reports a decrease in ostomy output after getting some solid food.  She complains she has felt sick since that time.    Objective: Vital signs in last 24 hours: Temp:  [97.5 F (36.4 C)-98.2 F (36.8 C)] 97.9 F (36.6 C) (03/16 0713) Resp:  [15-25] 25 (03/16 1000) BP: (145-186)/(65-113) 161/97 mmHg (03/16 1000) SpO2:  [92 %-97 %] 96 % (03/16 1000) Last BM Date: 10/07/15  Intake/Output from previous day: 03/15 0701 - 03/16 0700 In: 776.3 [I.V.:776.3] Out: 1070 [Urine:620; Emesis/NG output:450] Intake/Output this shift:    General appearance: alert, cooperative and no distress GI: she is distended, sitting up in the chair, BS hypoactive.  no nausea or vomiting.  She is not tender.  Lab Results:   Recent Labs  10/07/15 0316 10/09/15 0322  WBC 6.3 8.5  HGB 12.7 14.7  HCT 37.4 43.6  PLT 249 321    BMET  Recent Labs  10/08/15 0324 10/09/15 0322  NA 133* 128*  K 4.0 4.7  CL 95* 94*  CO2 27 24  GLUCOSE 129* 118*  BUN 7 10  CREATININE 0.83 0.76  CALCIUM 8.7* 8.2*   PT/INR No results for input(s): LABPROT, INR in the last 72 hours.   Recent Labs Lab 10/04/15 1222 10/05/15 0453  AST 17 15  ALT 14 11*  ALKPHOS 87 77  BILITOT 0.6 0.7  PROT 6.4* 5.4*  ALBUMIN 3.5 3.0*     Lipase     Component Value Date/Time   LIPASE 22 10/04/2015 1222     Studies/Results: Dg Abd 1 View  10/09/2015  CLINICAL DATA:  SBO. Abdominal pain and distention. EXAM: ABDOMEN - 1 VIEW COMPARISON:  10/08/2015 FINDINGS: A nasogastric tube is in place, or back upon itself vomits tip redirected into the esophagus and not imaged. There is dilatation of numerous small bowel loops. Paucity of large bowel gas. No evidence for free intraperitoneal air. Elevation of left hemidiaphragm again noted. IMPRESSION: 1. Small bowel obstruction. 2. No evidence for free air. 3. Nasogastric tube redirected into the lower esophagus and needs to be repositioned. 4. Elevated left  hemidiaphragm. These results will be called to the ordering clinician or representative by the Radiologist Assistant, and communication documented in the PACS or zVision Dashboard. Electronically Signed   By: Nolon Nations M.D.   On: 10/09/2015 09:23   Dg Abd 2 Views  10/08/2015  CLINICAL DATA:  Nausea and abdominal distention since yesterday. EXAM: ABDOMEN - 2 VIEW COMPARISON:  10/06/2015 FINDINGS: Right set up decubitus film shows no evidence for intraperitoneal free air. Supine film show diffuse gaseous bowel distension with small bowel loops measuring up to 4.6 cm in diameter. A more distended loop of the right abdomen may be related to a small bowel anastomosis. Imaging features are similar to the study from 10/05/2015. IMPRESSION: Interval increase in gaseous small bowel dilatation, suggesting recurrent or evolving small bowel obstruction. Electronically Signed   By: Misty Stanley M.D.   On: 10/08/2015 14:45    Medications: . digoxin  0.125 mg Intravenous Daily  . enoxaparin (LOVENOX) injection  40 mg Subcutaneous Q24H    Assessment/Plan SBO/hx of Ovarian cancer with invasion into the rectum and then small bowel obstruction S/p 1 laparotomy with bypass, and from jejunum to jejunum, of pelvic mass, Stamm gastrostomy, descending loop colostomy 04/04/12, Dr. Donne Hazel 2.  Sigmoid colectomy,  Revision of colostomy to end colostomy and Hartman's pouch,10/03/12 Dr. Donne Hazel; Exploratory laparotomy, lysis of adhesions, infracolic omentectomy, bilateral salpingo-oophorectomy, resection of  ileal nodule, optimal R0 tumor debulking, Dr. Imagene Gurney A. Alycia Rossetti, 10/03/12 Pelvic mass bladder mass with Exam under anesthesia, cystoscopy, TURBT and fulguration greater than 5 cm, 09/05/13, Dr. Junious Silk Radiation treatment for recurrent ovarian vs primary bladder cancer 09/05/13 Dr. Sondra Come:  completed 10/22/13 Chemotherapy Dr. Benay Spice Cystoscopy, TURBT of greater than 5 cm bladder tumor,06/27/14 Dr. Diona Fanti TURBT  of large bladder tumor, 09/29/15, Dr. Diona Fanti Seen 10/04/15 and readmitted with nausea, vomiting and SBO SBO resolved 10/07/15, with conservative management  AF with RVR.  Cards has seen and feels she would be ok for anesthesia Dehydration Hyponatremia Hypertension/hypotensin Possible UTI Ovarian and bladder cancer Antibiotics:  Ceftriaxone x 3 days, completed 10/06/15  DVT:  Lovenox/SCD   Film shows small bowel obstruction, recurrent  Plan for OR later today.  All questions answered.  Will have anesthesia evaluate.  Risks of surgery discussed with patient that include bleeding, infection, damage to adjacent structures, possible bowel resection.  We discussed that we may repair her hernias if feasible.   The surgery and anatomy were described to the patient as well as the risks of surgery and the possible complications.  These include: Bleeding, deep abdominal infections and possible wound complications such as hernia and infection, damage to adjacent structures, leak of surgical connections, which can lead to other surgeries and possibly an ostomy, possible need for other procedures, such as abscess drains in radiology, possible prolonged hospital stay, possible diarrhea from removal of part of the colon, possible constipation from narcotics, prolonged fatigue/weakness or appetite loss, possible complications of their medical problems such as heart disease or arrhythmias or lung problems, death (less than 1%). I believe the patient understands and wishes to proceed with the surgery.     LOS: 5 days    Deyona Soza C. [id number]

## 2015-10-10 DIAGNOSIS — C7911 Secondary malignant neoplasm of bladder: Secondary | ICD-10-CM

## 2015-10-10 DIAGNOSIS — C569 Malignant neoplasm of unspecified ovary: Secondary | ICD-10-CM

## 2015-10-10 LAB — CBC
HCT: 42.8 % (ref 36.0–46.0)
Hemoglobin: 14.7 g/dL (ref 12.0–15.0)
MCH: 32.4 pg (ref 26.0–34.0)
MCHC: 34.3 g/dL (ref 30.0–36.0)
MCV: 94.3 fL (ref 78.0–100.0)
PLATELETS: 281 10*3/uL (ref 150–400)
RBC: 4.54 MIL/uL (ref 3.87–5.11)
RDW: 14.2 % (ref 11.5–15.5)
WBC: 12.2 10*3/uL — AB (ref 4.0–10.5)

## 2015-10-10 LAB — BASIC METABOLIC PANEL
Anion gap: 7 (ref 5–15)
BUN: 11 mg/dL (ref 6–20)
CHLORIDE: 101 mmol/L (ref 101–111)
CO2: 25 mmol/L (ref 22–32)
CREATININE: 0.9 mg/dL (ref 0.44–1.00)
Calcium: 7.9 mg/dL — ABNORMAL LOW (ref 8.9–10.3)
GFR calc Af Amer: >60 mL/min (ref >60)
GFR calc non Af Amer: 56 mL/min — ABNORMAL LOW (ref >60)
Glucose, Bld: 120 mg/dL — ABNORMAL HIGH (ref 65–99)
Potassium: 4.5 mmol/L (ref 3.5–5.1)
SODIUM: 133 mmol/L — AB (ref 135–145)

## 2015-10-10 MED ORDER — SODIUM CHLORIDE 0.9 % IV BOLUS (SEPSIS)
500.0000 mL | Freq: Once | INTRAVENOUS | Status: AC
  Administered 2015-10-10: 500 mL via INTRAVENOUS

## 2015-10-10 NOTE — Progress Notes (Signed)
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 General surgery consulted. Treated conservatively with bowel rest and NG tube decompression. SBO resolved & surgery planning to advance diet. Transferred to SDU on 3/12 due to rapid A. fib/flutter-reverted to sinus rhythm after digoxin load greater than and paroxysmal A. fib and placed on IV Cardizem drip at 5 mcg and IV digoxin. Cardiology consulted. SBO resolved. Starting back home dose of oral Cardizem CD and will discontinue Cardizem drip. Unfortunately, the patient developed recurrent small bowel obstruction during the hospitalization, and she was taken to surgery on 10/09/2015 for lysis of adhesions. During her time being NPO, the pt was placed on a diltiazem drip. 45 years and is worried that it would cultures he is 89 and Assessment & Plan:  Paroxysmal atrial flutter/fib with RVR - Patient is on Cardizem CD 360 mg daily at home which she was unable to tolerate since 3/9 due to SBO symptoms. - On 10/05/15 afternoon, she went into symptomatic atrial flutter with RVR & 2:1 block. She was transferred to stepdown unit. Attempted to start Cardizem drip but was hypotensive (may even have been rate related) and hence not continued. Case was discussed with cardiologist on-call and started on digoxin load. - Patient reverted to sinus rhythm at approximately 4 AM on  3/13 but then had paroxysmal A. fib.  -Cardiology input appreciated. Patient was placed on IV Cardizem drip at 5 g and continued on IV digoxin. - SBO resolved. Resume home dose of oral Cardizem CD 360 daily and discontinue Cardizem drip 2 hours later. Has received today's dose of IV digoxin-reassess to discontinue on 3/15. Discussed with cardiology. - CHADs VASc=4). Not candidate for anticoagulation secondary to hematuria from ovarian cancer with bladder matter stasis. Has been off of Coumadin for approximately 2 years. -pt states she has "swelling" with pindolol--defer use of metoprolol to cardiology -continue digoxin IV for now -10/09/15--restart IV diltiazem as pt unable to take PO due to SBO--HR remains 100-110   SBO - SBO resolved initially but recurred on 3/15 -10/08/15--abdominal x-ray revealed small bowel obstruction--NG placed; surgery reconsulted -10/09/15-case discussed with Surgery, Dr. Jacinto Reap to go to surgery -10/09/15-lysis of adhesions-appreciate Dr. Marcello Moores -diet advancement per surgery  Dehydration - Secondary to GI losses -Restarted intravenous fluids secondary to NPO -remove K from maintenance IV fluids secondary to potassium trending up   Essential hypertension/hypotension - Briefly developed hypotension overnight 3/12, may be rate related from A. fib with RVR.  -Resolved.  Ovarian cancer - Outpatient follow-up with oncology  Possible UTI - Patient had completed course of Keflex after recent bladder surgery. Urine microscopy on admission showed many bacteria and WBCs and RBCs. She was empirically started on IV Rocephin but urine cultures were never sent. No dysuria or urinary frequency. Hematuria improved. Completed 3 days of IV Rocephin. Discontinue antibiotics.  Bladder Cancer - recent surgery  Anemia - Hemoglobin normal/stable   DVT prophylaxis: Lovenox Code Status: DO NOT RESUSCITATE Family Communication:  Granddaughter updated at bedside  10/09/15 Disposition Plan: Continue monitoring in stepdown unit   Consultants:  General surgery  Cardiology  Procedures:  NG tube decompression-discontinued  Antimicrobials:  IV Rocephin 3/11 > 3/13    Procedures/Studies: Dg Abd 1 View  10/09/2015  CLINICAL DATA:  SBO. Abdominal pain and distention. EXAM: ABDOMEN - 1 VIEW COMPARISON:  10/08/2015 FINDINGS: A nasogastric tube is in place, or back upon itself vomits tip redirected into the esophagus and not imaged. There is dilatation of numerous small bowel loops. Paucity of large bowel gas. No evidence for free intraperitoneal air. Elevation of left hemidiaphragm again noted. IMPRESSION: 1. Small bowel obstruction. 2. No evidence for free air. 3. Nasogastric tube redirected into the lower esophagus and needs to be repositioned. 4. Elevated left hemidiaphragm. These results will be called to the ordering clinician or representative by the Radiologist Assistant, and communication documented in the PACS or zVision Dashboard. Electronically Signed   By: Nolon Nations M.D.   On: 10/09/2015 09:23   Ct Chest Wo Contrast  09/25/2015  CLINICAL DATA:  History of ovarian cancer diagnosed in 2013 and history of bladder cancer, presenting with right posterior chest wall pain. History of "rib removed in 1934" per EPIC. EXAM: CT CHEST WITHOUT CONTRAST TECHNIQUE: Multidetector CT imaging of the chest was performed following the standard protocol without IV contrast. COMPARISON:  08/31/2013 CT abdomen/ pelvis. 03/22/2012 PET-CT. No prior chest CT. 09/03/2013 chest radiograph. FINDINGS: Mediastinum/Nodes: Stable mild cardiomegaly. Two lead left subclavian pacemaker is noted with lead tips in the right atrium and right ventricular apex. No pericardial fluid/thickening. Coronary atherosclerosis. Great vessels are normal in course and caliber. Normal visualized thyroid. Normal esophagus. No pathologically enlarged axillary, mediastinal or gross hilar lymph  nodes, noting limited sensitivity for the detection of hilar adenopathy on this noncontrast study. Lungs/Pleura: No pneumothorax. No pleural effusion. There is a 1.8 x 0.9 cm tracheal diverticulum at the right posterior trachea at the level of the thoracic inlet (series 5/image 9). There are postsurgical changes in the right lower lobe suggestive of wedge resection. There is varicoid and cystic bronchiectasis with associated parenchymal bands, architectural distortion and mild pleural thickening adjacent to surgical sutures in the superior peripheral right lower lobe. These findings are not appreciably changed compared to the 03/22/2012 PET-CT study. Calcified subcentimeter granuloma in the left upper lobe. No acute consolidative airspace disease, significant pulmonary nodules or lung masses. Upper abdomen: New 3 mm calcification in the lower left kidney, which may represent a nonobstructing left renal stone. Musculoskeletal: No aggressive appearing focal osseous lesions. Stable healed deformity in the posterior right ninth and tenth ribs. Moderate degenerative changes in the thoracic spine. IMPRESSION: 1. No acute abnormality. Stable scarring and bronchiectasis at the site of prior right lower lobe lung surgery. Stable healed deformity in the posterior right ninth and tenth ribs. 2. Stable mild cardiomegaly without pulmonary edema. Coronary atherosclerosis. 3. New 3 left renal calcification, possibly a nonobstructing stone. Electronically Signed   By: Ilona Sorrel M.D.   On: 09/25/2015 14:31   Ct Abdomen Pelvis W Contrast  10/04/2015  CLINICAL DATA:  History of bladder cancer, bladder tumor removed on Monday. Abdominal pain, vomiting. EXAM: CT ABDOMEN AND PELVIS WITH CONTRAST TECHNIQUE: Multidetector CT imaging of the abdomen and pelvis was performed using the standard protocol following bolus administration of intravenous contrast. CONTRAST:  52mL OMNIPAQUE IOHEXOL 300 MG/ML SOLN, 80mL OMNIPAQUE IOHEXOL 300  MG/ML SOLN COMPARISON:  08/31/2013 FINDINGS: Lower chest: There is cardiomegaly. Pacer wires  noted in the right heart. Coronary artery calcifications present. Bilateral lower lobe atelectasis. No pleural effusions. Hepatobiliary: Unremarkable appearance Pancreas: Atrophic.  No mass appear Spleen: Unremarkable appearance Adrenals/Urinary Tract: Areas of scarring in the kidneys bilaterally. Small cysts in the right kidney. No hydronephrosis. Adrenal glands are unremarkable. Bladder is decompressed. Within the right lower pelvis, there is a 5.2 x 4.9 cm rim enhancing mass with central low-density and gas. This likely reflects a residual tumor. Stomach/Bowel: Patient is status post left lower quadrant colostomy. Extensive diverticulosis throughout the colon. There is small bowel dilatation throughout the abdomen and into the pelvis. Distal small bowel is decompressed. In addition, there is swirled appearance of the mesenteric vessels centrally. This is concerning for internal hernia or low less likely volvulus as the cause of the obstruction. There is moderate free fluid in the pelvis. Small amount of free fluid around the spleen and liver. Free fluid also noted within the left lower quadrant ostomy site. Vascular/Lymphatic: Aorta is heavily calcified, non aneurysmal. No adenopathy. Reproductive: Prior hysterectomy.  No adnexal masses. Other: No free air Musculoskeletal: Diffuse degenerative changes throughout the lumbar spine. IMPRESSION: Small bowel obstruction. There is a swirled appearance of the central mesenteric vessels suggesting the cause of the obstruction is internal hernia or less likely volvulus. Associated free fluid in the abdomen and pelvis. Left lower quadrant ostomy with extensive diverticular disease throughout the decompressed colon. Peripherally enhancing mass within the lower right pelvis, likely residual bladder wall tumor. Cardiomegaly, coronary artery disease. Bibasilar atelectasis.  Electronically Signed   By: Rolm Baptise M.D.   On: 10/04/2015 17:05   Dg Abd 2 Views  10/08/2015  CLINICAL DATA:  Nausea and abdominal distention since yesterday. EXAM: ABDOMEN - 2 VIEW COMPARISON:  10/06/2015 FINDINGS: Right set up decubitus film shows no evidence for intraperitoneal free air. Supine film show diffuse gaseous bowel distension with small bowel loops measuring up to 4.6 cm in diameter. A more distended loop of the right abdomen may be related to a small bowel anastomosis. Imaging features are similar to the study from 10/05/2015. IMPRESSION: Interval increase in gaseous small bowel dilatation, suggesting recurrent or evolving small bowel obstruction. Electronically Signed   By: Misty Stanley M.D.   On: 10/08/2015 14:45   Dg Abd 2 Views  10/06/2015  CLINICAL DATA:  Follow-up small bowel obstruction. EXAM: ABDOMEN - 2 VIEW COMPARISON:  10/05/2015 abdominal radiographs. FINDINGS: Enteric tube loops in the proximal stomach and terminates in the distal stomach. No residual dilated small bowel loops or air-fluid levels. No evidence of pneumatosis or pneumoperitoneum. No pathologic soft tissue calcifications. Pacemaker leads overlie the right atrium and right ventricular apex. Marked degenerative changes in the visualized thoracolumbar spine. IMPRESSION: Nonobstructive bowel gas pattern, with no residual dilated small bowel loops. Electronically Signed   By: Ilona Sorrel M.D.   On: 10/06/2015 09:16   Dg Abd 2 Views  10/05/2015  CLINICAL DATA:  Small bowel obstruction EXAM: ABDOMEN - 2 VIEW COMPARISON:  CT 10/04/2015 FINDINGS: NG tube is in the stomach. Continued dilated small bowel loops with air-fluid levels, likely not significantly changed when compared to the scout film on prior CT. No free air organomegaly. IMPRESSION: Continued small bowel obstruction pattern.  No real change. Electronically Signed   By: Rolm Baptise M.D.   On: 10/05/2015 10:18         Subjective: Patient denies  fevers, chills, headache, chest pain, dyspnea, nausea, vomiting, diarrhea, abdominal pain, dysuria, hematuria   Objective: Filed Vitals:  10/10/15 1200 10/10/15 1300 10/10/15 1400 10/10/15 1500  BP: 95/54   103/67  Pulse:      Temp: 98.4 F (36.9 C)     TempSrc: Oral     Resp: 15 19 18 19   Height:      Weight:      SpO2: 92% 93% 93% 94%    Intake/Output Summary (Last 24 hours) at 10/10/15 1713 Last data filed at 10/10/15 1533  Gross per 24 hour  Intake 2280.42 ml  Output   1200 ml  Net 1080.42 ml   Weight change:  Exam:   General:  Pt is alert, follows commands appropriately, not in acute distress  HEENT: No icterus, No thrush, No neck mass, [place]/AT  Cardiovascular: RRR, S1/S2, no rubs, no gallops  Respiratory: Diminished breath sounds at the bases but clear to auscultation. No wheezing  Abdomen: Soft/+BS, non tender, non distended, no guarding; liquid stool noted in the ostomy  Extremities: No edema, No lymphangitis, No petechiae, No rashes, no synovitis  Data Reviewed: Basic Metabolic Panel:  Recent Labs Lab 10/05/15 0453 10/07/15 0316 10/08/15 0324 10/09/15 0322 10/10/15 0315  NA 135 136 133* 128* 133*  K 4.3 3.1* 4.0 4.7 4.5  CL 100* 100* 95* 94* 101  CO2 26 26 27 24 25   GLUCOSE 101* 90 129* 118* 120*  BUN 16 10 7 10 11   CREATININE 0.82 0.87 0.83 0.76 0.90  CALCIUM 8.3* 8.0* 8.7* 8.2* 7.9*  MG 1.8 1.6*  --   --   --   PHOS 3.8  --   --   --   --    Liver Function Tests:  Recent Labs Lab 10/04/15 1222 10/05/15 0453  AST 17 15  ALT 14 11*  ALKPHOS 87 77  BILITOT 0.6 0.7  PROT 6.4* 5.4*  ALBUMIN 3.5 3.0*    Recent Labs Lab 10/04/15 1222  LIPASE 22   No results for input(s): AMMONIA in the last 168 hours. CBC:  Recent Labs Lab 10/05/15 0453 10/06/15 0321 10/07/15 0316 10/09/15 0322 10/10/15 0315  WBC 3.1* 4.0 6.3 8.5 12.2*  HGB 12.6 11.9* 12.7 14.7 14.7  HCT 38.6 35.5* 37.4 43.6 42.8  MCV 95.1 93.2 92.3 94.0 94.3  PLT 237  193 249 321 281   Cardiac Enzymes: No results for input(s): CKTOTAL, CKMB, CKMBINDEX, TROPONINI in the last 168 hours. BNP: Invalid input(s): POCBNP CBG: No results for input(s): GLUCAP in the last 168 hours.  Recent Results (from the past 240 hour(s))  MRSA PCR Screening     Status: None   Collection Time: 10/05/15  6:47 PM  Result Value Ref Range Status   MRSA by PCR NEGATIVE NEGATIVE Final    Comment:        The GeneXpert MRSA Assay (FDA approved for NASAL specimens only), is one component of a comprehensive MRSA colonization surveillance program. It is not intended to diagnose MRSA infection nor to guide or monitor treatment for MRSA infections.      Scheduled Meds: . digoxin  0.125 mg Intravenous Daily  . enoxaparin (LOVENOX) injection  40 mg Subcutaneous Q24H   Continuous Infusions: . sodium chloride 75 mL/hr at 10/10/15 0650  . diltiazem (CARDIZEM) infusion 10 mg/hr (10/10/15 0814)     Koki Buxton, DO  [facility] Pager [phone]  If 7PM-7AM, please contact night-coverage [email or web address] Password [email or web address] 10/10/2015, 5:13 PM   LOS: 6 days

## 2015-10-10 NOTE — Progress Notes (Signed)
1 Day Post-Op  Subjective: She wants her NG out and to be able to take gingerale.  She feels good, marked difference.  She does have gas and some stool in her ostomy bag.    Objective: Vital signs in last 24 hours: Temp:  [96 F (35.6 C)-98.3 F (36.8 C)] 97.5 F (36.4 C) (03/17 0332) Pulse Rate:  [96-105] 105 (03/16 1555) Resp:  [11-18] 15 (03/17 1000) BP: (86-158)/(54-100) 112/65 mmHg (03/17 1000) SpO2:  [90 %-100 %] 92 % (03/17 1000) Arterial Line BP: (163)/(84-88) 163/88 mmHg (03/16 1515) Last BM Date: 10/09/15 NG 800 Urine 1400,  Stool 25 Afebrile, VSS WBC up some to 12.2/BMP OK No films  Intake/Output from previous day: 03/16 0701 - 03/17 0700 In: 3734.2 [I.V.:3684.2; IV Piggyback:50] Out: 2250 [Urine:1400; Emesis/NG output:800; Stool:25; Blood:25] Intake/Output this shift:    General appearance: alert, cooperative and no distress GI: soft, sore few BS, gas and some stool in the ostomy bag, ostomy is pink and looks good.  Lab Results:   Recent Labs  10/09/15 0322 10/10/15 0315  WBC 8.5 12.2*  HGB 14.7 14.7  HCT 43.6 42.8  PLT 321 281    BMET  Recent Labs  10/09/15 0322 10/10/15 0315  NA 128* 133*  K 4.7 4.5  CL 94* 101  CO2 24 25  GLUCOSE 118* 120*  BUN 10 11  CREATININE 0.76 0.90  CALCIUM 8.2* 7.9*   PT/INR No results for input(s): LABPROT, INR in the last 72 hours.   Recent Labs Lab 10/04/15 1222 10/05/15 0453  AST 17 15  ALT 14 11*  ALKPHOS 87 77  BILITOT 0.6 0.7  PROT 6.4* 5.4*  ALBUMIN 3.5 3.0*     Lipase     Component Value Date/Time   LIPASE 22 10/04/2015 1222     Studies/Results: Dg Abd 1 View  10/09/2015  CLINICAL DATA:  SBO. Abdominal pain and distention. EXAM: ABDOMEN - 1 VIEW COMPARISON:  10/08/2015 FINDINGS: A nasogastric tube is in place, or back upon itself vomits tip redirected into the esophagus and not imaged. There is dilatation of numerous small bowel loops. Paucity of large bowel gas. No evidence for free  intraperitoneal air. Elevation of left hemidiaphragm again noted. IMPRESSION: 1. Small bowel obstruction. 2. No evidence for free air. 3. Nasogastric tube redirected into the lower esophagus and needs to be repositioned. 4. Elevated left hemidiaphragm. These results will be called to the ordering clinician or representative by the Radiologist Assistant, and communication documented in the PACS or zVision Dashboard. Electronically Signed   By: Nolon Nations M.D.   On: 10/09/2015 09:23   Dg Abd 2 Views  10/08/2015  CLINICAL DATA:  Nausea and abdominal distention since yesterday. EXAM: ABDOMEN - 2 VIEW COMPARISON:  10/06/2015 FINDINGS: Right set up decubitus film shows no evidence for intraperitoneal free air. Supine film show diffuse gaseous bowel distension with small bowel loops measuring up to 4.6 cm in diameter. A more distended loop of the right abdomen may be related to a small bowel anastomosis. Imaging features are similar to the study from 10/05/2015. IMPRESSION: Interval increase in gaseous small bowel dilatation, suggesting recurrent or evolving small bowel obstruction. Electronically Signed   By: Misty Stanley M.D.   On: 10/08/2015 14:45    Medications: . digoxin  0.125 mg Intravenous Daily  . enoxaparin (LOVENOX) injection  40 mg Subcutaneous Q24H    Assessment/Plan Small Bowel Obstruction, internal hernia/s/p EXPLORATORY LAPAROTOMY, LYSIS OF ADHESIONS, [id number], Dr. Leighton Ruff SBO/hx of  Ovarian cancer with invasion into the rectum and then small bowel obstruction S/p 1 laparotomy with bypass, and from jejunum to jejunum, of pelvic mass, Stamm gastrostomy, descending loop colostomy 04/04/12, Dr. Donne Hazel 2. Sigmoid colectomy, Revision of colostomy to end colostomy and Hartman's pouch,10/03/12 Dr. Donne Hazel; Exploratory laparotomy, lysis of adhesions, infracolic omentectomy, bilateral salpingo-oophorectomy, resection of ileal nodule, optimal R0 tumor debulking, Dr. Imagene Gurney A. Alycia Rossetti,  10/03/12 Pelvic mass bladder mass with Exam under anesthesia, cystoscopy, TURBT and fulguration greater than 5 cm, 09/05/13, Dr. Junious Silk Radiation treatment for recurrent ovarian vs primary bladder cancer 09/05/13 Dr. Sondra Come: completed 10/22/13 Chemotherapy Dr. Benay Spice Cystoscopy, TURBT of greater than 5 cm bladder tumor,06/27/14 Dr. Diona Fanti TURBT of large bladder tumor, 09/29/15, Dr. Diona Fanti Seen 10/04/15 and readmitted with nausea, vomiting and SBO SBO resolved 10/07/15, with conservative management AF with RVR. Cards has seen and feels she would be ok for anesthesia Dehydration Hyponatremia Hypertension/hypotensin Possible UTI Ovarian and bladder cancer Antibiotics: Ceftriaxone x 3 days, completed 10/06/15  DVT: Lovenox/SCD    Plan:  Leave NG for now, I will give her sips and chips with ginger ale .  Watch NG drainage and be sure she is OK with good function before removing again.        LOS: 6 days    Akasia Ahmad 10/10/2015

## 2015-10-10 NOTE — Progress Notes (Signed)
PT Cancellation Note  Patient Details Name: Victoria [name] MRN: [id number] DOB: December 31, 1926   Cancelled Treatment:    Reason Eval/Treat Not Completed: PT screened, no needs identified, will sign off. Spoke with pt who denied need for PT services. RN reports she walked around unit with pt today as well.    Weston Anna, MPT Pager: [phone]

## 2015-10-11 LAB — CBC
HEMATOCRIT: 34.5 % — AB (ref 36.0–46.0)
HEMOGLOBIN: 11.7 g/dL — AB (ref 12.0–15.0)
MCH: 30.9 pg (ref 26.0–34.0)
MCHC: 33.9 g/dL (ref 30.0–36.0)
MCV: 91 fL (ref 78.0–100.0)
Platelets: 229 10*3/uL (ref 150–400)
RBC: 3.79 MIL/uL — ABNORMAL LOW (ref 3.87–5.11)
RDW: 14.3 % (ref 11.5–15.5)
WBC: 8.3 10*3/uL (ref 4.0–10.5)

## 2015-10-11 LAB — BASIC METABOLIC PANEL
ANION GAP: 5 (ref 5–15)
BUN: 10 mg/dL (ref 6–20)
CO2: 26 mmol/L (ref 22–32)
Calcium: 7.4 mg/dL — ABNORMAL LOW (ref 8.9–10.3)
Chloride: 101 mmol/L (ref 101–111)
Creatinine, Ser: 0.64 mg/dL (ref 0.44–1.00)
GFR calc Af Amer: >60 mL/min (ref >60)
GLUCOSE: 94 mg/dL (ref 65–99)
POTASSIUM: 3.5 mmol/L (ref 3.5–5.1)
Sodium: 132 mmol/L — ABNORMAL LOW (ref 135–145)

## 2015-10-11 MED ORDER — POTASSIUM CHLORIDE CRYS ER 20 MEQ PO TBCR
30.0000 meq | EXTENDED_RELEASE_TABLET | Freq: Once | ORAL | Status: AC
  Administered 2015-10-11: 30 meq via ORAL
  Filled 2015-10-11: qty 1

## 2015-10-11 MED ORDER — POTASSIUM CHLORIDE 10 MEQ/100ML IV SOLN
10.0000 meq | INTRAVENOUS | Status: DC
  Administered 2015-10-11: 10 meq via INTRAVENOUS
  Filled 2015-10-11: qty 100

## 2015-10-11 MED ORDER — POTASSIUM CHLORIDE 10 MEQ/100ML IV SOLN: 10.0000 meq | INTRAVENOUS | Status: DC

## 2015-10-11 MED ORDER — DILTIAZEM HCL 30 MG PO TABS
30.0000 mg | ORAL_TABLET | Freq: Four times a day (QID) | ORAL | Status: DC
  Administered 2015-10-11 – 2015-10-12 (×5): 30 mg via ORAL
  Filled 2015-10-11 (×5): qty 1

## 2015-10-11 NOTE — Progress Notes (Signed)
Received patient from stepdown. Agree with previous shift assessment. Pt denies pain and states she feels well. No concerns/complaints at this time.  Barbee Shropshire. Brigitte Pulse, RN

## 2015-10-11 NOTE — Progress Notes (Signed)
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 General surgery consulted. Treated conservatively with bowel rest and NG tube decompression. SBO resolved & surgery planning to advance diet. Transferred to SDU on 3/12 due to rapid A. fib/flutter-reverted to sinus rhythm after digoxin load greater than and paroxysmal A. fib and placed on IV Cardizem drip at 5 mcg and IV digoxin. Cardiology consulted. SBO resolved. Starting back home dose of oral Cardizem CD and will discontinue Cardizem drip. Unfortunately, the patient developed recurrent small bowel obstruction during the hospitalization, and she was taken to surgery on 10/09/2015 for lysis of adhesions. During her time being NPO, the pt was placed on a diltiazem drip. After surgery, the patient's diet was gradually advanced. After she began tolerating her diet, diltiazem was transitioned back to oral formulation. Assessment & Plan:  Paroxysmal atrial flutter/fib with RVR - Patient is on Cardizem CD 360 mg daily at home which she was unable to tolerate since 3/9 due to SBO symptoms. - On 10/05/15 afternoon, she went into symptomatic atrial flutter with RVR & 2:1 block. She was transferred to stepdown unit. Attempted to start Cardizem drip but was hypotensive (may even have been rate related) and hence not continued. Case was discussed with cardiologist on-call and  started on digoxin load. - Patient reverted to sinus rhythm at approximately 4 AM on 3/13 but then had paroxysmal A. fib.  -Cardiology input appreciated. Patient was placed on IV Cardizem drip at 5 g and continued on IV digoxin. - SBO resolved. Resume home dose of oral Cardizem CD 360 daily and discontinue Cardizem drip 2 hours later. Has received today's dose of IV digoxin-reassess to discontinue on 3/15. Discussed with cardiology. - CHADs VASc=4). Not candidate for anticoagulation secondary to hematuria from ovarian cancer with bladder metastasis. Has been off of Coumadin for approximately 2 years. -pt states she has "swelling" with pindolol--defer use of metoprolol to cardiology -continue digoxin IV for now -10/09/15--restart IV diltiazem as pt unable to take PO due to SBO--HR remains 100-110  -10/11/2015--transitioned back to oral diltiazem; replete potassium today to keep potassium closer to 4.0;   SBO - SBO resolved initially but recurred on 3/15 -10/08/15--abdominal x-ray revealed small bowel obstruction--NG placed; surgery reconsulted -10/09/15-case discussed with Surgery, Dr. Jacinto Reap to go to surgery -10/09/15-lysis of adhesions-appreciate Dr. Marcello Moores -diet advancement per surgery -10/11/2015--soft diet  Dehydration - Secondary to GI losses -Restarted intravenous fluids when pt was NPO -Saline locked IV fluid now the patient is able to have oral intake  Essential hypertension/hypotension - Briefly developed hypotension overnight 3/12, may be rate related from A. fib with RVR.  -Resolved.  Ovarian cancer - Outpatient follow-up with oncology  Possible UTI - Patient had completed course of Keflex after recent bladder surgery. Urine microscopy on admission showed many bacteria and WBCs and RBCs. She was empirically started on IV Rocephin but urine cultures were never sent. No dysuria or urinary frequency. Hematuria improved. Completed 3 days of IV Rocephin. Discontinue  antibiotics.  Bladder Cancer - recent surgery  Anemia - Hemoglobin normal/stable   DVT prophylaxis: Lovenox Code Status: DO NOT RESUSCITATE Family Communication: Son updated at bedside 10/11/15 Disposition Plan: transfer to tele  Consultants:  General surgery  Cardiology  Procedures:  NG tube decompression-discontinued  Antimicrobials:  IV Rocephin 3/11 > 3/13    Procedures/Studies: Dg Abd 1 View  10/09/2015  CLINICAL DATA:  SBO. Abdominal pain and distention. EXAM: ABDOMEN - 1 VIEW COMPARISON:  10/08/2015 FINDINGS: A nasogastric tube is in place, or back upon itself vomits tip redirected into the esophagus and not imaged. There is dilatation of numerous small bowel loops. Paucity of large bowel gas. No evidence for free intraperitoneal air. Elevation of left hemidiaphragm again noted. IMPRESSION: 1. Small bowel obstruction. 2. No evidence for free air. 3. Nasogastric tube redirected into the lower esophagus and needs to be repositioned. 4. Elevated left hemidiaphragm. These results will be called to the ordering clinician or representative by the Radiologist Assistant, and communication documented in the PACS or zVision Dashboard. Electronically Signed   By: Nolon Nations M.D.   On: 10/09/2015 09:23   Ct Chest Wo Contrast  09/25/2015  CLINICAL DATA:  History of ovarian cancer diagnosed in 2013 and history of bladder cancer, presenting with right posterior chest wall pain. History of "rib removed in 1934" per EPIC. EXAM: CT CHEST WITHOUT CONTRAST TECHNIQUE: Multidetector CT imaging of the chest was performed following the standard protocol without IV contrast. COMPARISON:  08/31/2013 CT abdomen/ pelvis. 03/22/2012 PET-CT. No prior chest CT. 09/03/2013 chest radiograph. FINDINGS: Mediastinum/Nodes: Stable mild cardiomegaly. Two lead left subclavian pacemaker is noted with lead tips in the right atrium and right ventricular apex. No pericardial fluid/thickening. Coronary  atherosclerosis. Great vessels are normal in course and caliber. Normal visualized thyroid. Normal esophagus. No pathologically enlarged axillary, mediastinal or gross hilar lymph nodes, noting limited sensitivity for the detection of hilar adenopathy on this noncontrast study. Lungs/Pleura: No pneumothorax. No pleural effusion. There is a 1.8 x 0.9 cm tracheal diverticulum at the right posterior trachea at the level of the thoracic inlet (series 5/image 9). There are postsurgical changes in the right lower lobe suggestive of wedge resection. There is varicoid and cystic bronchiectasis with associated parenchymal bands, architectural distortion and mild pleural thickening adjacent to surgical sutures in the superior peripheral right lower lobe. These findings are not appreciably changed compared to the 03/22/2012 PET-CT study. Calcified subcentimeter granuloma in the left upper lobe. No acute consolidative airspace disease, significant pulmonary nodules or lung masses. Upper abdomen: New 3 mm calcification in the lower left kidney, which may represent a nonobstructing left renal stone. Musculoskeletal: No aggressive appearing focal osseous lesions. Stable healed deformity in the posterior right ninth and tenth ribs. Moderate degenerative changes in the thoracic spine. IMPRESSION: 1. No acute abnormality. Stable scarring and bronchiectasis at the site of prior right lower lobe lung surgery. Stable healed deformity in the posterior right ninth and tenth ribs. 2. Stable mild cardiomegaly without pulmonary edema. Coronary atherosclerosis. 3. New 3 left renal calcification, possibly a nonobstructing stone. Electronically Signed   By: Ilona Sorrel M.D.   On: 09/25/2015 14:31   Ct Abdomen Pelvis W Contrast  10/04/2015  CLINICAL DATA:  History of bladder cancer, bladder tumor removed on Monday. Abdominal pain, vomiting. EXAM: CT ABDOMEN AND PELVIS WITH CONTRAST TECHNIQUE: Multidetector CT imaging of the abdomen and  pelvis was performed using the standard protocol following bolus administration of intravenous contrast. CONTRAST:  40mL OMNIPAQUE IOHEXOL  300 MG/ML SOLN, 74mL OMNIPAQUE IOHEXOL 300 MG/ML SOLN COMPARISON:  08/31/2013 FINDINGS: Lower chest: There is cardiomegaly. Pacer wires noted in the right heart. Coronary artery calcifications present. Bilateral lower lobe atelectasis. No pleural effusions. Hepatobiliary: Unremarkable appearance Pancreas: Atrophic.  No mass appear Spleen: Unremarkable appearance Adrenals/Urinary Tract: Areas of scarring in the kidneys bilaterally. Small cysts in the right kidney. No hydronephrosis. Adrenal glands are unremarkable. Bladder is decompressed. Within the right lower pelvis, there is a 5.2 x 4.9 cm rim enhancing mass with central low-density and gas. This likely reflects a residual tumor. Stomach/Bowel: Patient is status post left lower quadrant colostomy. Extensive diverticulosis throughout the colon. There is small bowel dilatation throughout the abdomen and into the pelvis. Distal small bowel is decompressed. In addition, there is swirled appearance of the mesenteric vessels centrally. This is concerning for internal hernia or low less likely volvulus as the cause of the obstruction. There is moderate free fluid in the pelvis. Small amount of free fluid around the spleen and liver. Free fluid also noted within the left lower quadrant ostomy site. Vascular/Lymphatic: Aorta is heavily calcified, non aneurysmal. No adenopathy. Reproductive: Prior hysterectomy.  No adnexal masses. Other: No free air Musculoskeletal: Diffuse degenerative changes throughout the lumbar spine. IMPRESSION: Small bowel obstruction. There is a swirled appearance of the central mesenteric vessels suggesting the cause of the obstruction is internal hernia or less likely volvulus. Associated free fluid in the abdomen and pelvis. Left lower quadrant ostomy with extensive diverticular disease throughout the  decompressed colon. Peripherally enhancing mass within the lower right pelvis, likely residual bladder wall tumor. Cardiomegaly, coronary artery disease. Bibasilar atelectasis. Electronically Signed   By: Rolm Baptise M.D.   On: 10/04/2015 17:05   Dg Abd 2 Views  10/08/2015  CLINICAL DATA:  Nausea and abdominal distention since yesterday. EXAM: ABDOMEN - 2 VIEW COMPARISON:  10/06/2015 FINDINGS: Right set up decubitus film shows no evidence for intraperitoneal free air. Supine film show diffuse gaseous bowel distension with small bowel loops measuring up to 4.6 cm in diameter. A more distended loop of the right abdomen may be related to a small bowel anastomosis. Imaging features are similar to the study from 10/05/2015. IMPRESSION: Interval increase in gaseous small bowel dilatation, suggesting recurrent or evolving small bowel obstruction. Electronically Signed   By: Misty Stanley M.D.   On: 10/08/2015 14:45   Dg Abd 2 Views  10/06/2015  CLINICAL DATA:  Follow-up small bowel obstruction. EXAM: ABDOMEN - 2 VIEW COMPARISON:  10/05/2015 abdominal radiographs. FINDINGS: Enteric tube loops in the proximal stomach and terminates in the distal stomach. No residual dilated small bowel loops or air-fluid levels. No evidence of pneumatosis or pneumoperitoneum. No pathologic soft tissue calcifications. Pacemaker leads overlie the right atrium and right ventricular apex. Marked degenerative changes in the visualized thoracolumbar spine. IMPRESSION: Nonobstructive bowel gas pattern, with no residual dilated small bowel loops. Electronically Signed   By: Ilona Sorrel M.D.   On: 10/06/2015 09:16   Dg Abd 2 Views  10/05/2015  CLINICAL DATA:  Small bowel obstruction EXAM: ABDOMEN - 2 VIEW COMPARISON:  CT 10/04/2015 FINDINGS: NG tube is in the stomach. Continued dilated small bowel loops with air-fluid levels, likely not significantly changed when compared to the scout film on prior CT. No free air organomegaly. IMPRESSION:  Continued small bowel obstruction pattern.  No real change. Electronically Signed   By: Rolm Baptise M.D.   On: 10/05/2015 10:18         Subjective:  Patient denies fevers, chills, headache, chest pain, dyspnea, nausea, vomiting, diarrhea, abdominal pain, dysuria, hematuria   Objective: Filed Vitals:   10/11/15 0700 10/11/15 0800 10/11/15 0900 10/11/15 1000  BP: 116/69 106/94 118/67 104/62  Pulse:      Temp:  98.4 F (36.9 C)    TempSrc:  Oral    Resp: 14 17 16 16   Height:      Weight:      SpO2: 100% 95% 94% 94%    Intake/Output Summary (Last 24 hours) at 10/11/15 1049 Last data filed at 10/11/15 1000  Gross per 24 hour  Intake 2031.25 ml  Output    350 ml  Net 1681.25 ml   Weight change:  Exam:   General:  Pt is alert, follows commands appropriately, not in acute distress  HEENT: No icterus, No thrush, No neck mass, [place]/AT  Cardiovascular: RRR, S1/S2, no rubs, no gallops  Respiratory: Bibasilar crackles. No wheezing. Good air movement  Abdomen: Soft/+BS, non tender, non distended, no guarding; liquid stool and gas in the ostomy; no hepatosplenomegaly  Extremities: No edema, No lymphangitis, No petechiae, No rashes, no synovitis  Data Reviewed: Basic Metabolic Panel:  Recent Labs Lab 10/05/15 0453 10/07/15 0316 10/08/15 0324 10/09/15 0322 10/10/15 0315 10/11/15 0313  NA 135 136 133* 128* 133* 132*  K 4.3 3.1* 4.0 4.7 4.5 3.5  CL 100* 100* 95* 94* 101 101  CO2 26 26 27 24 25 26   GLUCOSE 101* 90 129* 118* 120* 94  BUN 16 10 7 10 11 10   CREATININE 0.82 0.87 0.83 0.76 0.90 0.64  CALCIUM 8.3* 8.0* 8.7* 8.2* 7.9* 7.4*  MG 1.8 1.6*  --   --   --   --   PHOS 3.8  --   --   --   --   --    Liver Function Tests:  Recent Labs Lab 10/04/15 1222 10/05/15 0453  AST 17 15  ALT 14 11*  ALKPHOS 87 77  BILITOT 0.6 0.7  PROT 6.4* 5.4*  ALBUMIN 3.5 3.0*    Recent Labs Lab 10/04/15 1222  LIPASE 22   No results for input(s): AMMONIA in the last 168  hours. CBC:  Recent Labs Lab 10/06/15 0321 10/07/15 0316 10/09/15 0322 10/10/15 0315 10/11/15 0313  WBC 4.0 6.3 8.5 12.2* 8.3  HGB 11.9* 12.7 14.7 14.7 11.7*  HCT 35.5* 37.4 43.6 42.8 34.5*  MCV 93.2 92.3 94.0 94.3 91.0  PLT 193 249 321 281 229   Cardiac Enzymes: No results for input(s): CKTOTAL, CKMB, CKMBINDEX, TROPONINI in the last 168 hours. BNP: Invalid input(s): POCBNP CBG: No results for input(s): GLUCAP in the last 168 hours.  Recent Results (from the past 240 hour(s))  MRSA PCR Screening     Status: None   Collection Time: 10/05/15  6:47 PM  Result Value Ref Range Status   MRSA by PCR NEGATIVE NEGATIVE Final    Comment:        The GeneXpert MRSA Assay (FDA approved for NASAL specimens only), is one component of a comprehensive MRSA colonization surveillance program. It is not intended to diagnose MRSA infection nor to guide or monitor treatment for MRSA infections.      Scheduled Meds: . digoxin  0.125 mg Intravenous Daily  . diltiazem  30 mg Oral 4 times per day  . enoxaparin (LOVENOX) injection  40 mg Subcutaneous Q24H  . potassium chloride  10 mEq Intravenous Q1 Hr x 3   Continuous Infusions: . sodium chloride  10 mL/hr at 10/11/15 0757     Kerissa Coia, DO  [facility] Pager [phone]  If 7PM-7AM, please contact night-coverage [email or web address] Password [email or web address] 10/11/2015, 10:49 AM   LOS: 7 days

## 2015-10-11 NOTE — Progress Notes (Signed)
Patient c/o severe burning from IV KCL while infusing.  Called Dr. Carles Collet and IV potassium changed to po.  Notified RN on fourth floor about changes.  Zarielle Cea Roselie Awkward RN

## 2015-10-11 NOTE — Progress Notes (Signed)
2 Days Post-Op  Subjective: NG out, tolerating full liquids  Objective: Vital signs in last 24 hours: Temp:  [97.9 F (36.6 C)-98.6 F (37 C)] 98.4 F (36.9 C) (03/18 0400) Resp:  [14-21] 21 (03/17 2300) BP: (95-122)/(54-71) 120/61 mmHg (03/17 2300) SpO2:  [90 %-99 %] 96 % (03/17 2300) Weight:  [69.3 kg (152 lb 12.5 oz)] 69.3 kg (152 lb 12.5 oz) (03/18 0400) Last BM Date: 10/09/15  Intake/Output from previous day: 03/17 0701 - 03/18 0700 In: 765 [I.V.:765] Out: 50 [Urine:50] Intake/Output this shift:    General appearance: alert, cooperative and no distress GI: soft, sore few BS, gas and some stool in the ostomy bag, ostomy is pink and looks good. Air and stool in bag Lab Results:   Recent Labs  10/10/15 0315 10/11/15 0313  WBC 12.2* 8.3  HGB 14.7 11.7*  HCT 42.8 34.5*  PLT 281 229    BMET  Recent Labs  10/10/15 0315 10/11/15 0313  NA 133* 132*  K 4.5 3.5  CL 101 101  CO2 25 26  GLUCOSE 120* 94  BUN 11 10  CREATININE 0.90 0.64  CALCIUM 7.9* 7.4*   PT/INR No results for input(s): LABPROT, INR in the last 72 hours.   Recent Labs Lab 10/04/15 1222 10/05/15 0453  AST 17 15  ALT 14 11*  ALKPHOS 87 77  BILITOT 0.6 0.7  PROT 6.4* 5.4*  ALBUMIN 3.5 3.0*     Lipase     Component Value Date/Time   LIPASE 22 10/04/2015 1222     Studies/Results: Dg Abd 1 View  10/09/2015  CLINICAL DATA:  SBO. Abdominal pain and distention. EXAM: ABDOMEN - 1 VIEW COMPARISON:  10/08/2015 FINDINGS: A nasogastric tube is in place, or back upon itself vomits tip redirected into the esophagus and not imaged. There is dilatation of numerous small bowel loops. Paucity of large bowel gas. No evidence for free intraperitoneal air. Elevation of left hemidiaphragm again noted. IMPRESSION: 1. Small bowel obstruction. 2. No evidence for free air. 3. Nasogastric tube redirected into the lower esophagus and needs to be repositioned. 4. Elevated left hemidiaphragm. These results will  be called to the ordering clinician or representative by the Radiologist Assistant, and communication documented in the PACS or zVision Dashboard. Electronically Signed   By: Nolon Nations M.D.   On: 10/09/2015 09:23    Medications: . digoxin  0.125 mg Intravenous Daily  . enoxaparin (LOVENOX) injection  40 mg Subcutaneous Q24H    Assessment/Plan Small Bowel Obstruction, internal hernia/s/p EXPLORATORY LAPAROTOMY, LYSIS OF ADHESIONS, [id number], Dr. Leighton Ruff SBO/hx of Ovarian cancer with invasion into the rectum and then small bowel obstruction S/p 1 laparotomy with bypass, and from jejunum to jejunum, of pelvic mass, Stamm gastrostomy, descending loop colostomy 04/04/12, Dr. Donne Hazel 2. Sigmoid colectomy, Revision of colostomy to end colostomy and Hartman's pouch,10/03/12 Dr. Donne Hazel; Exploratory laparotomy, lysis of adhesions, infracolic omentectomy, bilateral salpingo-oophorectomy, resection of ileal nodule, optimal R0 tumor debulking, Dr. Imagene Gurney A. Alycia Rossetti, 10/03/12 Pelvic mass bladder mass with Exam under anesthesia, cystoscopy, TURBT and fulguration greater than 5 cm, 09/05/13, Dr. Junious Silk Radiation treatment for recurrent ovarian vs primary bladder cancer 09/05/13 Dr. Sondra Come: completed 10/22/13 Chemotherapy Dr. Benay Spice Cystoscopy, TURBT of greater than 5 cm bladder tumor,06/27/14 Dr. Diona Fanti TURBT of large bladder tumor, 09/29/15, Dr. Diona Fanti Seen 10/04/15 and readmitted with nausea, vomiting and SBO SBO resolved 10/07/15, with conservative management AF with RVR. Cards has seen and feels she would be ok for anesthesia Dehydration Hyponatremia Hypertension/hypotensin  Possible UTI Ovarian and bladder cancer Antibiotics: Ceftriaxone x 3 days, completed 10/06/15  DVT: Lovenox/SCD    Plan: Pt doing well and has recovered her bowel function.  Will increase to soft diet.        LOS: 7 days    Annamay Laymon C. [id number]

## 2015-10-12 LAB — BASIC METABOLIC PANEL
Anion gap: 9 (ref 5–15)
BUN: 6 mg/dL (ref 6–20)
CALCIUM: 8.1 mg/dL — AB (ref 8.9–10.3)
CHLORIDE: 100 mmol/L — AB (ref 101–111)
CO2: 26 mmol/L (ref 22–32)
CREATININE: 0.73 mg/dL (ref 0.44–1.00)
GFR calc non Af Amer: >60 mL/min (ref >60)
GLUCOSE: 99 mg/dL (ref 65–99)
Potassium: 3.9 mmol/L (ref 3.5–5.1)
Sodium: 135 mmol/L (ref 135–145)

## 2015-10-12 LAB — MAGNESIUM: Magnesium: 1.5 mg/dL — ABNORMAL LOW (ref 1.7–2.4)

## 2015-10-12 MED ORDER — MAGNESIUM SULFATE 2 GM/50ML IV SOLN: 2.0000 g | Freq: Once | INTRAVENOUS | Status: DC

## 2015-10-12 MED ORDER — MAGNESIUM OXIDE 400 (241.3 MG) MG PO TABS
400.0000 mg | ORAL_TABLET | Freq: Once | ORAL | Status: AC
  Administered 2015-10-12: 400 mg via ORAL
  Filled 2015-10-12: qty 1

## 2015-10-12 MED ORDER — DIGOXIN 125 MCG PO TABS: 0.1250 mg | ORAL_TABLET | Freq: Every day | ORAL | Status: DC

## 2015-10-12 NOTE — Discharge Summary (Signed)
Physician Discharge Summary  Victoria [name] [id number] DOB: [birth year] DOA: 10/04/2015  PCP: Donnajean Lopes, MD  Admit date: 10/04/2015 Discharge date: 10/12/2015  Recommendations for Outpatient Follow-up:  1. Pt will need to follow up with PCP in 1-2 weeks post discharge 2. Please obtain BMP in one week  Discharge Diagnoses:  Paroxysmal atrial flutter/fib with RVR - Patient is on Cardizem CD 360 mg daily at home which she was unable to tolerate since 3/9 due to SBO symptoms. - On 10/05/15 afternoon, she went into symptomatic atrial flutter with RVR & 2:1 block. She was transferred to stepdown unit. Attempted to start Cardizem drip but was hypotensive (may even have been rate related) and hence not continued. Case was discussed with cardiologist on-call and started on digoxin load. - Patient reverted to sinus rhythm at approximately 4 AM on 3/13 but then had paroxysmal A. fib.  -Cardiology input appreciated. Patient was placed on IV Cardizem drip at 5 g and continued on IV digoxin. - SBO resolved. Resume home dose of oral Cardizem CD 360 daily and discontinue Cardizem drip 2 hours later. Has received today's dose of IV digoxin-reassess to discontinue on 3/15. Discussed with cardiology. - CHADs VASc=4. Not candidate for anticoagulation secondary to hematuria from ovarian cancer with bladder metastasis. Has been off of Coumadin for approximately 2 years. -pt states she has "swelling" with pindolol--defer use of metoprolol to cardiology -continue digoxin IV per cardiology during pt's npo status and acute medical condition -10/09/15--restart IV diltiazem as pt unable to take PO due to SBO--HR remains 100-110  -10/11/2015--transitioned back to oral diltiazem; replete potassium today to keep potassium closer to 4.0;  -The patient will resume her home dose of Cardizem CD 360 mg daily. She will also continue on digoxin 0.125 mg daily after discharge although I suspect that she may not need this  long-term as her atrial fibrillation was likely exacerbated due to her acute medical condition -Advised the patient follow up with her cardiologist soon after discharge to reassess her need for digoxin  SBO - SBO resolved initially but recurred on 3/15 -10/08/15--abdominal x-ray revealed small bowel obstruction--NG placed; surgery reconsulted -10/09/15-case discussed with Surgery, Dr. Jacinto Reap to go to surgery -10/09/15-lysis of adhesions-appreciate Dr. Marcello Moores -diet advancement per surgery -10/11/2015--soft diet  Dehydration - Secondary to GI losses -Restarted intravenous fluids when pt was NPO -Saline locked IV fluid now the patient is able to have oral intake  Essential hypertension/hypotension - Briefly developed hypotension overnight 3/12, may be rate related from A. fib with RVR.  -Resolved.  Ovarian cancer - Outpatient follow-up with oncology  Possible UTI - Patient had completed course of Keflex after recent bladder surgery. Urine microscopy on admission showed many bacteria and WBCs and RBCs. She was empirically started on IV Rocephin but urine cultures were never sent. No dysuria or urinary frequency. Hematuria improved. Completed 3 days of IV Rocephin. Discontinue antibiotics.  Bladder Cancer - recent surgery  Anemia - Hemoglobin normal/stable  Discharge Condition: stable  Disposition: home Follow-up Information    Follow up with Rosario Adie., MD. Schedule an appointment as soon as possible for a visit in 2 weeks.   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 General surgery consulted. Treated conservatively with bowel rest and NG tube decompression. SBO resolved & surgery planning to advance diet. Transferred to SDU on 3/12 due to rapid A. fib/flutter-reverted to sinus rhythm after digoxin load greater than and paroxysmal A. fib and placed on IV Cardizem drip at 5 mcg and IV digoxin. Cardiology consulted. SBO resolved. Starting back home dose of oral Cardizem CD and will discontinue Cardizem drip. Unfortunately, the patient developed recurrent small bowel obstruction during the hospitalization, and she was taken to surgery on 10/09/2015 for lysis of adhesions. During her time being NPO, the pt was placed on a diltiazem drip. After surgery, the patient's diet was gradually advanced. After she began tolerating her diet, diltiazem was transitioned back to oral formulation. Because of her acute medical condition, the patient developed atrial fibrillation with RVR. As result, the patient was started on digoxin IV as the patient had previously stated reaction to beta blockers. The patient was converted to oral digoxin short-term. She was advised follow-up with her cardiologist after discharge to determine the need for continued digoxin.  Consultants: cardiology  Discharge Exam: Filed Vitals:   10/11/15 2100 10/12/15 0414  BP: 116/68 127/71  Pulse: 80 104  Temp: 98.1 F (36.7 C) 97.9 F (36.6 C)  Resp: 16 18   Filed Vitals:   10/11/15 1128 10/11/15 1524 10/11/15 2100 10/12/15 0414  BP: 122/79 123/70 116/68 127/71  Pulse: 103 102 80 104  Temp: 97.5 F (36.4 C) 97.6 F (36.4 C) 98.1 F (36.7  C) 97.9 F (36.6 C)  TempSrc: Oral Oral Oral Oral  Resp: 16 16 16 18   Height: 5' (1.524 m)     Weight: 66.6 kg (146 lb 13.2 oz)     SpO2: 95% 97% 96% 93%   General: A&O x 3, NAD, pleasant, cooperative Cardiovascular: RRR, no rub, no gallop, no S3 Respiratory: CTAB, no wheeze, no rhonchi Abdomen:soft, nontender, nondistended, positive bowel sounds Extremities: No edema, No lymphangitis, no petechiae  Discharge Instructions      Discharge Instructions    Diet - low sodium heart healthy    Complete by:  As directed      Increase activity slowly    Complete by:  As directed             Medication List    STOP taking these medications        cephALEXin 500 MG capsule  Commonly known as:  KEFLEX      TAKE these medications        digoxin 0.125 MG tablet  Commonly known as:  LANOXIN  Take 1 tablet (0.125 mg total) by mouth daily.  Start taking on:  10/13/2015     diltiazem 360 MG 24 hr capsule  Commonly known as:  CARDIZEM CD  Take 1 capsule (360 mg total) by mouth daily.     docusate sodium 100 MG capsule  Commonly known as:  COLACE  Take 100 mg by mouth daily as needed for mild constipation.     furosemide 20 MG tablet  Commonly known as:  LASIX  Take 1 tablet (20 mg total) by mouth daily as needed.     GENTEAL OP  Apply 1 drop to eye 2 (two) times daily as needed (Dry eyes).     iron polysaccharides 150 MG capsule  Commonly known as:  NIFEREX  Take 1 capsule (150 mg total) by mouth daily.     lactose free nutrition Liqd  Take 237 mLs by mouth 2 (two) times a week.     polyethylene glycol packet  Commonly known as:  MIRALAX / GLYCOLAX  Take 17 g by mouth daily as needed for mild constipation or moderate constipation.     potassium chloride 10 MEQ tablet  Commonly known as:  K-DUR  Take 1 tablet (10 mEq total) by mouth daily.         The results of significant diagnostics from this hospitalization (including imaging, microbiology, ancillary and  laboratory) are listed below for reference.    Significant Diagnostic Studies: Dg Abd 1 View  10/09/2015  CLINICAL DATA:  SBO. Abdominal pain and distention. EXAM: ABDOMEN - 1 VIEW COMPARISON:  10/08/2015 FINDINGS: A nasogastric tube is in place, or back upon itself vomits tip redirected into the esophagus and not imaged. There is dilatation of numerous small bowel loops. Paucity of large bowel gas. No evidence for free intraperitoneal air. Elevation of left hemidiaphragm again noted. IMPRESSION: 1. Small bowel obstruction. 2. No evidence for free air. 3. Nasogastric tube redirected into the lower esophagus and needs to be repositioned. 4. Elevated left hemidiaphragm. These results will be called to the ordering clinician or representative by the Radiologist Assistant, and communication documented in the PACS or zVision Dashboard. Electronically Signed   By: Nolon Nations M.D.   On: 10/09/2015 09:23   Ct Chest Wo Contrast  09/25/2015  CLINICAL DATA:  History of ovarian cancer diagnosed in 2013 and history of bladder cancer, presenting with right posterior chest wall pain. History of "rib removed in 1934" per EPIC. EXAM: CT CHEST WITHOUT CONTRAST TECHNIQUE: Multidetector CT imaging of the chest was performed following the standard protocol without IV contrast. COMPARISON:  08/31/2013 CT abdomen/ pelvis. 03/22/2012 PET-CT. No prior chest CT. 09/03/2013 chest radiograph. FINDINGS: Mediastinum/Nodes: Stable mild cardiomegaly. Two lead left subclavian pacemaker is noted with lead tips in the right atrium and right ventricular apex. No pericardial fluid/thickening. Coronary atherosclerosis. Great vessels are normal in course and caliber. Normal visualized thyroid. Normal esophagus. No pathologically enlarged axillary, mediastinal or gross hilar lymph nodes, noting limited sensitivity for the detection of hilar adenopathy on this noncontrast study. Lungs/Pleura: No pneumothorax. No pleural effusion. There is a 1.8  x 0.9 cm tracheal diverticulum at the right posterior trachea at the level of the thoracic inlet (series 5/image 9). There are postsurgical changes in the right lower lobe suggestive of wedge resection. There is varicoid and cystic bronchiectasis with associated parenchymal bands, architectural distortion and mild pleural thickening adjacent to surgical sutures in the superior peripheral right lower lobe. These findings are not appreciably changed compared to the 03/22/2012 PET-CT study. Calcified subcentimeter granuloma in the left upper lobe. No acute consolidative airspace disease, significant pulmonary nodules or lung masses. Upper abdomen: New 3 mm calcification in the lower left kidney, which may represent a nonobstructing left renal stone. Musculoskeletal: No aggressive appearing focal osseous lesions. Stable healed deformity in the posterior right ninth and tenth ribs. Moderate degenerative changes in the thoracic spine. IMPRESSION: 1. No acute abnormality. Stable scarring and bronchiectasis at the  site of prior right lower lobe lung surgery. Stable healed deformity in the posterior right ninth and tenth ribs. 2. Stable mild cardiomegaly without pulmonary edema. Coronary atherosclerosis. 3. New 3 left renal calcification, possibly a nonobstructing stone. Electronically Signed   By: Ilona Sorrel M.D.   On: 09/25/2015 14:31   Ct Abdomen Pelvis W Contrast  10/04/2015  CLINICAL DATA:  History of bladder cancer, bladder tumor removed on Monday. Abdominal pain, vomiting. EXAM: CT ABDOMEN AND PELVIS WITH CONTRAST TECHNIQUE: Multidetector CT imaging of the abdomen and pelvis was performed using the standard protocol following bolus administration of intravenous contrast. CONTRAST:  1mL OMNIPAQUE IOHEXOL 300 MG/ML SOLN, 58mL OMNIPAQUE IOHEXOL 300 MG/ML SOLN COMPARISON:  08/31/2013 FINDINGS: Lower chest: There is cardiomegaly. Pacer wires noted in the right heart. Coronary artery calcifications present. Bilateral  lower lobe atelectasis. No pleural effusions. Hepatobiliary: Unremarkable appearance Pancreas: Atrophic.  No mass appear Spleen: Unremarkable appearance Adrenals/Urinary Tract: Areas of scarring in the kidneys bilaterally. Small cysts in the right kidney. No hydronephrosis. Adrenal glands are unremarkable. Bladder is decompressed. Within the right lower pelvis, there is a 5.2 x 4.9 cm rim enhancing mass with central low-density and gas. This likely reflects a residual tumor. Stomach/Bowel: Patient is status post left lower quadrant colostomy. Extensive diverticulosis throughout the colon. There is small bowel dilatation throughout the abdomen and into the pelvis. Distal small bowel is decompressed. In addition, there is swirled appearance of the mesenteric vessels centrally. This is concerning for internal hernia or low less likely volvulus as the cause of the obstruction. There is moderate free fluid in the pelvis. Small amount of free fluid around the spleen and liver. Free fluid also noted within the left lower quadrant ostomy site. Vascular/Lymphatic: Aorta is heavily calcified, non aneurysmal. No adenopathy. Reproductive: Prior hysterectomy.  No adnexal masses. Other: No free air Musculoskeletal: Diffuse degenerative changes throughout the lumbar spine. IMPRESSION: Small bowel obstruction. There is a swirled appearance of the central mesenteric vessels suggesting the cause of the obstruction is internal hernia or less likely volvulus. Associated free fluid in the abdomen and pelvis. Left lower quadrant ostomy with extensive diverticular disease throughout the decompressed colon. Peripherally enhancing mass within the lower right pelvis, likely residual bladder wall tumor. Cardiomegaly, coronary artery disease. Bibasilar atelectasis. Electronically Signed   By: Rolm Baptise M.D.   On: 10/04/2015 17:05   Dg Abd 2 Views  10/08/2015  CLINICAL DATA:  Nausea and abdominal distention since yesterday. EXAM: ABDOMEN  - 2 VIEW COMPARISON:  10/06/2015 FINDINGS: Right set up decubitus film shows no evidence for intraperitoneal free air. Supine film show diffuse gaseous bowel distension with small bowel loops measuring up to 4.6 cm in diameter. A more distended loop of the right abdomen may be related to a small bowel anastomosis. Imaging features are similar to the study from 10/05/2015. IMPRESSION: Interval increase in gaseous small bowel dilatation, suggesting recurrent or evolving small bowel obstruction. Electronically Signed   By: Misty Stanley M.D.   On: 10/08/2015 14:45   Dg Abd 2 Views  10/06/2015  CLINICAL DATA:  Follow-up small bowel obstruction. EXAM: ABDOMEN - 2 VIEW COMPARISON:  10/05/2015 abdominal radiographs. FINDINGS: Enteric tube loops in the proximal stomach and terminates in the distal stomach. No residual dilated small bowel loops or air-fluid levels. No evidence of pneumatosis or pneumoperitoneum. No pathologic soft tissue calcifications. Pacemaker leads overlie the right atrium and right ventricular apex. Marked degenerative changes in the visualized thoracolumbar spine. IMPRESSION: Nonobstructive bowel gas pattern,  with no residual dilated small bowel loops. Electronically Signed   By: Ilona Sorrel M.D.   On: 10/06/2015 09:16   Dg Abd 2 Views  10/05/2015  CLINICAL DATA:  Small bowel obstruction EXAM: ABDOMEN - 2 VIEW COMPARISON:  CT 10/04/2015 FINDINGS: NG tube is in the stomach. Continued dilated small bowel loops with air-fluid levels, likely not significantly changed when compared to the scout film on prior CT. No free air organomegaly. IMPRESSION: Continued small bowel obstruction pattern.  No real change. Electronically Signed   By: Rolm Baptise M.D.   On: 10/05/2015 10:18     Microbiology: Recent Results (from the past 240 hour(s))  MRSA PCR Screening     Status: None   Collection Time: 10/05/15  6:47 PM  Result Value Ref Range Status   MRSA by PCR NEGATIVE NEGATIVE Final    Comment:         The GeneXpert MRSA Assay (FDA approved for NASAL specimens only), is one component of a comprehensive MRSA colonization surveillance program. It is not intended to diagnose MRSA infection nor to guide or monitor treatment for MRSA infections.      Labs: Basic Metabolic Panel:  Recent Labs Lab 10/07/15 0316 10/08/15 0324 10/09/15 0322 10/10/15 0315 10/11/15 0313 10/12/15 0556  NA 136 133* 128* 133* 132* 135  K 3.1* 4.0 4.7 4.5 3.5 3.9  CL 100* 95* 94* 101 101 100*  CO2 26 27 24 25 26 26   GLUCOSE 90 129* 118* 120* 94 99  BUN 10 7 10 11 10 6   CREATININE 0.87 0.83 0.76 0.90 0.64 0.73  CALCIUM 8.0* 8.7* 8.2* 7.9* 7.4* 8.1*  MG 1.6*  --   --   --   --  1.5*   Liver Function Tests: No results for input(s): AST, ALT, ALKPHOS, BILITOT, PROT, ALBUMIN in the last 168 hours. No results for input(s): LIPASE, AMYLASE in the last 168 hours. No results for input(s): AMMONIA in the last 168 hours. CBC:  Recent Labs Lab 10/06/15 0321 10/07/15 0316 10/09/15 0322 10/10/15 0315 10/11/15 0313  WBC 4.0 6.3 8.5 12.2* 8.3  HGB 11.9* 12.7 14.7 14.7 11.7*  HCT 35.5* 37.4 43.6 42.8 34.5*  MCV 93.2 92.3 94.0 94.3 91.0  PLT 193 249 321 281 229   Cardiac Enzymes: No results for input(s): CKTOTAL, CKMB, CKMBINDEX, TROPONINI in the last 168 hours. BNP: Invalid input(s): POCBNP CBG: No results for input(s): GLUCAP in the last 168 hours.  Time coordinating discharge:  Greater than 30 minutes  Signed:  Vivyan Biggers, DO [facility] Pager: [phone] 10/12/2015, 1:04 PM

## 2015-10-12 NOTE — Care Management Important Message (Signed)
Important Message  Patient Details  Name: Victoria [name] MRN: [id number] Date of Birth: 1926-10-06   Medicare Important Message Given:  Yes    Erenest Rasher, RN 10/12/2015, 1:22 PM

## 2015-10-12 NOTE — Discharge Instructions (Signed)

## 2015-10-12 NOTE — Care Management Note (Signed)
Case Management Note  Patient Details  Name: ODENE WOESSNER MRN: [id number] Date of Birth: [birth year]  Subjective/Objective:    SBO                Action/Plan: Discharge Planning: AVS reviewed: NCM spoke to pt and son, Dominica Severin at bedside. Pt states she can afford her medications at home. States she lives at home with husband. Son and dtr-in-law assist with her care as needed.   Mort Sawyers MD    Expected Discharge Date:  10/12/2015              Expected Discharge Plan:  Home/Self Care  In-House Referral:  NA  Discharge planning Services  CM Consult  Post Acute Care Choice:  NA Choice offered to:  NA  DME Arranged:  N/A DME Agency:  NA  HH Arranged:  NA HH Agency:  NA  Status of Service:  Completed, signed off  Medicare Important Message Given:  Yes Date Medicare IM Given:    Medicare IM give by:    Date Additional Medicare IM Given:    Additional Medicare Important Message give by:     If discussed at [place] of Stay Meetings, dates discussed:    Additional Comments:  Erenest Rasher, RN 10/12/2015, 1:32 PM

## 2015-10-12 NOTE — Progress Notes (Signed)
3 Days Post-Op  Subjective: Tolerating a diet.  Having good stoma output  Objective: Vital signs in last 24 hours: Temp:  [97.5 F (36.4 C)-98.1 F (36.7 C)] 97.9 F (36.6 C) (03/19 0414) Pulse Rate:  [80-104] 104 (03/19 0414) Resp:  [16-18] 18 (03/19 0414) BP: (104-127)/(62-79) 127/71 mmHg (03/19 0414) SpO2:  [93 %-97 %] 93 % (03/19 0414) Weight:  [66.6 kg (146 lb 13.2 oz)] 66.6 kg (146 lb 13.2 oz) (03/18 1128) Last BM Date: 10/09/15  Intake/Output from previous day: 03/18 0701 - 03/19 0700 In: 828.2 [P.O.:560; I.V.:168.2; IV Piggyback:100] Out: 550 [Urine:550] Intake/Output this shift:    General appearance: alert, cooperative and no distress GI: soft, sore few BS, gas and some stool in the ostomy bag, ostomy is pink and looks good. Air and stool in bag Wound: clean, dry, intact  Lab Results:   Recent Labs  10/10/15 0315 10/11/15 0313  WBC 12.2* 8.3  HGB 14.7 11.7*  HCT 42.8 34.5*  PLT 281 229    BMET  Recent Labs  10/11/15 0313 10/12/15 0556  NA 132* 135  K 3.5 3.9  CL 101 100*  CO2 26 26  GLUCOSE 94 99  BUN 10 6  CREATININE 0.64 0.73  CALCIUM 7.4* 8.1*   PT/INR No results for input(s): LABPROT, INR in the last 72 hours.  No results for input(s): AST, ALT, ALKPHOS, BILITOT, PROT, ALBUMIN in the last 168 hours.   Lipase     Component Value Date/Time   LIPASE 22 10/04/2015 1222     Studies/Results: No results found.  Medications: . digoxin  0.125 mg Intravenous Daily  . diltiazem  30 mg Oral 4 times per day  . enoxaparin (LOVENOX) injection  40 mg Subcutaneous Q24H    Assessment/Plan Small Bowel Obstruction, internal hernia/s/p EXPLORATORY LAPAROTOMY, LYSIS OF ADHESIONS, [id number], Dr. Leighton Ruff SBO/hx of Ovarian cancer with invasion into the rectum and then small bowel obstruction S/p 1 laparotomy with bypass, and from jejunum to jejunum, of pelvic mass, Stamm gastrostomy, descending loop colostomy 04/04/12, Dr. Donne Hazel 2. Sigmoid  colectomy, Revision of colostomy to end colostomy and Hartman's pouch,10/03/12 Dr. Donne Hazel; Exploratory laparotomy, lysis of adhesions, infracolic omentectomy, bilateral salpingo-oophorectomy, resection of ileal nodule, optimal R0 tumor debulking, Dr. Imagene Gurney A. Alycia Rossetti, 10/03/12 Pelvic mass bladder mass with Exam under anesthesia, cystoscopy, TURBT and fulguration greater than 5 cm, 09/05/13, Dr. Junious Silk Radiation treatment for recurrent ovarian vs primary bladder cancer 09/05/13 Dr. Sondra Come: completed 10/22/13 Chemotherapy Dr. Benay Spice Cystoscopy, TURBT of greater than 5 cm bladder tumor,06/27/14 Dr. Diona Fanti TURBT of large bladder tumor, 09/29/15, Dr. Diona Fanti Seen 10/04/15 and readmitted with nausea, vomiting and SBO SBO resolved 10/07/15, with conservative management AF with RVR. Cards has seen and feels she would be ok for anesthesia Dehydration Hyponatremia Hypertension/hypotensin Possible UTI Ovarian and bladder cancer Antibiotics: Ceftriaxone x 3 days, completed 10/06/15  DVT: Lovenox/SCD    Plan: Pt doing well and has recovered her bowel function, tolerating soft diet.  Ok to d/c from my standpoint.      LOS: 8 days    Shay Jhaveri C. [id number]

## 2015-10-12 NOTE — Progress Notes (Signed)
Reviewed discharge information with patient and caregiver. Answered all questions. Patient/caregiver able to teach back medications and reasons to contact MD/911. Patient verbalizes importance of PCP follow up appointment.  Keats Kingry M. Rishan Oyama, RN  

## 2015-10-13 LAB — TYPE AND SCREEN
ABO/RH(D): O NEG
ANTIBODY SCREEN: POSITIVE
DAT, IgG: NEGATIVE
UNIT DIVISION: 0
UNIT DIVISION: 0

## 2015-10-14 ENCOUNTER — Other Ambulatory Visit: Payer: Self-pay | Admitting: *Deleted

## 2015-10-14 ENCOUNTER — Telehealth: Payer: Self-pay

## 2015-10-14 NOTE — Anesthesia Postprocedure Evaluation (Signed)
Anesthesia Post Note  Patient: Victoria [name]  Procedure(s) Performed: Procedure(s) (LRB): EXPLORATORY LAPAROTOMY LYSIS OF ADHESIONS (N/A)  Patient location during evaluation: PACU Anesthesia Type: General Level of consciousness: awake and alert Pain management: pain level controlled Vital Signs Assessment: post-procedure vital signs reviewed and stable Respiratory status: spontaneous breathing, nonlabored ventilation, respiratory function stable and patient connected to nasal cannula oxygen Cardiovascular status: blood pressure returned to baseline and stable Postop Assessment: no signs of nausea or vomiting Anesthetic complications: no    Last Vitals:  Filed Vitals:   10/11/15 2100 10/12/15 0414  BP: 116/68 127/71  Pulse: 80 104  Temp: 36.7 C 36.6 C  Resp: 16 18    Last Pain:  Filed Vitals:   10/12/15 0814  PainSc: 0-No pain                 Effie Berkshire

## 2015-10-14 NOTE — Telephone Encounter (Signed)
Pt is calling to postpone her 3/28 appt. She has been in the hospital twice this month. She has f/u visits with Dr Beatrix Fetters and Dr Sallyanne Kuster coming up. She would like to have a break from Dr Benay Spice for a few months. She feels she needs some time to recuperate and get her strength back. Please put in POF for when Dr Benay Spice wants to see her next.

## 2015-10-15 ENCOUNTER — Telehealth: Payer: Self-pay | Admitting: Nurse Practitioner

## 2015-10-15 NOTE — Telephone Encounter (Signed)
Spoke with patient to confirm r/s appt date/time per 3/21 pof

## 2015-10-20 ENCOUNTER — Encounter: Payer: Self-pay | Admitting: Cardiovascular Disease

## 2015-10-20 ENCOUNTER — Ambulatory Visit (INDEPENDENT_AMBULATORY_CARE_PROVIDER_SITE_OTHER): Payer: Medicare Other | Admitting: Cardiovascular Disease

## 2015-10-20 VITALS — BP 104/52 | HR 70 | Ht 60.0 in | Wt 132.0 lb

## 2015-10-20 DIAGNOSIS — I4892 Unspecified atrial flutter: Secondary | ICD-10-CM | POA: Diagnosis not present

## 2015-10-20 DIAGNOSIS — I495 Sick sinus syndrome: Secondary | ICD-10-CM

## 2015-10-20 DIAGNOSIS — Z950 Presence of cardiac pacemaker: Secondary | ICD-10-CM

## 2015-10-20 DIAGNOSIS — I471 Supraventricular tachycardia: Secondary | ICD-10-CM | POA: Diagnosis not present

## 2015-10-20 DIAGNOSIS — I480 Paroxysmal atrial fibrillation: Secondary | ICD-10-CM | POA: Diagnosis not present

## 2015-10-20 NOTE — Progress Notes (Signed)
Patient ID: Victoria [name], female   DOB: [birth year], 82 y.o.   MRN: [id number]    Cardiology Office Note    Date:  10/21/2015   ID:  Victoria [name], Victoria [name] Oct 28, 1926, MRN [id number]  PCP:  Donnajean Lopes, MD  Cardiologist:   Sanda Klein, MD   Chief Complaint  Patient presents with  . Follow-up    1 week post hosp  PAF  pt c/o SOB/weakness on exertion, and occasional lightheadedness    History of Present Illness:  Victoria [name] is a 82 y.o. female with recurrent problems with rapid atrial arrhythmia, again exacerbated by small bowel obstruction leading to inability to take oral medications. 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 She had a recent cystoscopy to remove the tumor from her urinary bladder that was causing hematuria. A few days after that she return the small bowel obstruction and vomiting. During hospitalization she had problems with both atrial fibrillation (which is easier to rate control) and atrial flutter and paroxysmal atrial tachycardia with one-to-one conduction, which are much harder to slow down. Eventually she had to have surgery for lysis of adhesions. Despite high embolic risk, she is not on anticoagulants due to recurrent hematuria. Digoxin was added during her hospitalization for better ventricular rate control since her blood pressure was low.  Today she presents with atrial paced ventricular sensed rhythm. Interrogation of her pacemaker shows normal function. Her [name] device was implanted in 2010 and has an estimated longevity of another 1.5-4.25 years. The overall burden of atrial mode switch since her last device check is 69%, but she was nearly 100% in atrial fibrillation from January to February. There is only 7.8% ventricular pacing. When not in atrial arrhythmia she has virtually 100% atrial paced rhythm. With the exception of the period  of her hospitalization ventricular rate control seems to have been adequate. She was unaware of the atrial fibrillation in which she was persistently for 2 months earlier this year.  She had a slight reduction in ventricular lead impedance to 293 ohms leading to a polarity switch. This happened right around the time of her abdominal surgery and was probably related to the use of electrocautery. Otherwise all ventricular and atrial lead parameters are similar to past readings. The P waves are 0.8 mV, atrial threshold 1 V at 0.4 ms pulse width. The R waves are 3.8-4.2 mV, threshold 1.25 V at 0.4 ms pulse width.  Past Medical History  Diagnosis Date  . Hyperlipidemia   . Diverticulosis   . Colostomy in place [facility])   . Fluid collection (edema) in the arms, legs, hands and feet   . Hypertension   . RBBB   . Iron deficiency anemia   . Gross hematuria     unable to anticoagulate  . Cardiac pacemaker in situ 2010    Braxton-- PLACEMENT 09-10-2008  . Paroxysmal atrial flutter (HCC)     CARDIOLOGIST--  DR Billi Bright  . SSS (sick sinus syndrome) (HCC)     S/P PACEMAKER  . Bladder cancer [facility]) recurrent bladder tumor     secondarty to ovarian cancer--  palliative radiation 09-13-2013 to 10-22-2013  . Malignant neoplasm of ovary ([place]) ONOCOLOGIST--  DR Benay Spice  03-13-2012  w/ rectal mass    dx  primay ovarian cancer , +cytokeratin 7, estrogen receptor and WT-1--  chemotherapy and s/p omentectomy and bilateral oophorectomy03-05-2013  . Nausea 2015  chronic intermittent nausea after chemo    Past Surgical History  Procedure Laterality Date  . Permanent pacemaker insertion  09-10-2008    [employer]Jude  . Rib removed  1934    Pleurisy and pneumonia  . Bladder neck suspension  1986  . Colon resection  04/04/2012    Procedure: COLON RESECTION;  Surgeon: Rolm Bookbinder, MD;  Location: [facility];  Service: General;  Laterality: N/A;  laparotomy with small bowel resection for obstruction with colostomy with  gastrostomy tube placement.   . Portacath placement  05/04/2012    Procedure: INSERTION PORT-A-CATH;  Surgeon: Rolm Bookbinder, MD;  Location: [facility];  Service: General;  Laterality: Right;  . Laparotomy Bilateral 10/03/2012    Procedure: EXPLORATORY LAPAROTOMY;  Surgeon: Imagene Gurney A. Alycia Rossetti, MD;  Location: [facility];  Service: Gynecology;  Laterality: Bilateral;  WITH BSO, TUMOR DEBULKING  . Colostomy revision N/A 10/03/2012    Procedure: COLOSTOMY REVISION;  Surgeon: Rolm Bookbinder, MD;  Location: [facility];  Service: General;  Laterality: N/A;  COLOSTOMY REVISION, Sigmoid colectomy  . Port-a-cath removal N/A 02/21/2013    Procedure: REMOVAL PORT-A-CATH;  Surgeon: Rolm Bookbinder, MD;  Location: [facility];  Service: General;  Laterality: N/A;  . Transurethral resection of bladder tumor with gyrus (turbt-gyrus) N/A 09/05/2013    Procedure: TRANSURETHRAL RESECTION OF BLADDER TUMOR WITH GYRUS (TURBT-GYRUS);  Surgeon: Fredricka Bonine, MD;  Location: [facility];  Service: Urology;  Laterality: N/A;  . Transurethral resection of bladder tumor N/A 06/27/2014    Procedure: TRANSURETHRAL RESECTION OF BLADDER TUMOR (TURBT);  Surgeon: Jorja Loa, MD;  Location: [facility];  Service: Urology;  Laterality: N/A;  . Cystoscopy N/A 06/27/2014    Procedure: CYSTOSCOPY;  Surgeon: Jorja Loa, MD;  Location: [facility];  Service: Urology;  Laterality: N/A;  . Abdominal hysterectomy  1972  . Cardiac electrophysiology mapping and ablation  05-21-2008   dr Caryl Comes    a-flutter  . Electrophysiology study  09-26-2008  dr Caryl Comes  03-27-2012  dr Joycelyn Liska    cardiac ,  no ablation in 2010/   2013  overdrive pacing atrial flutter  . Transthoracic echocardiogram  07-17-2008      ef >55%,  mild to moderate MR and TR,  mild  sclerotic AV without stenosis,    . Cardiovascular stress test  07-17-2008  dr Alieyah Spader    normal myocardial perfusion w/ attenuation artifact in the anterior region of mycardium;  no ischemia or  infarct/scar/  low risk scan/  normal LV function and wall motion , ef  83%  . Transurethral resection of bladder tumor N/A 11/18/2014    Procedure: TRANSURETHRAL RESECTION OF BLADDER TUMOR (TURBT);  Surgeon: Franchot Gallo, MD;  Location: [facility];  Service: Urology;  Laterality: N/A;  . Cystoscopy N/A 11/18/2014    Procedure: CYSTOSCOPY;  Surgeon: Franchot Gallo, MD;  Location: [facility];  Service: Urology;  Laterality: N/A;  . Transurethral resection of bladder tumor with gyrus (turbt-gyrus) N/A 09/29/2015    Procedure: TRANSURETHRAL RESECTION OF BLADDER TUMOR WITH GYRUS (TURBT-GYRUS);  Surgeon: Franchot Gallo, MD;  [facility];  Service: Urology;  Laterality: N/A;  . Laparotomy N/A 10/09/2015    Procedure: EXPLORATORY LAPAROTOMY LYSIS OF ADHESIONS;  Surgeon: Leighton Ruff, MD;  Location: [facility];  Service: General;  Laterality: N/A;    Outpatient Prescriptions Prior to Visit  Medication Sig Dispense Refill  . Carboxymethylcell-Hypromellose (GENTEAL OP) Apply 1 drop to eye 2 (two) times daily as needed (Dry eyes).    Marland Kitchen  docusate sodium (COLACE) 100 MG capsule Take 100 mg by mouth daily as needed for mild constipation.    . furosemide (LASIX) 20 MG tablet Take 1 tablet (20 mg total) by mouth daily as needed. (Patient taking differently: Take 20 mg by mouth daily as needed for fluid. ) 90 tablet 3  . iron polysaccharides (NIFEREX) 150 MG capsule Take 1 capsule (150 mg total) by mouth daily. (Patient taking differently: Take 150 mg by mouth every evening. ) 30 capsule 6  . lactose free nutrition (BOOST) LIQD Take 237 mLs by mouth 2 (two) times a week.     . polyethylene glycol (MIRALAX / GLYCOLAX) packet Take 17 g by mouth daily as needed for mild constipation or moderate constipation.    . potassium chloride (K-DUR) 10 MEQ tablet Take 1 tablet (10 mEq total) by mouth daily. (Patient taking differently: Take 10 mEq by mouth daily as needed (Fluid). ) 90  tablet 3  . digoxin (LANOXIN) 0.125 MG tablet Take 1 tablet (0.125 mg total) by mouth daily. 30 tablet 0  . diltiazem (CARDIZEM CD) 360 MG 24 hr capsule Take 1 capsule (360 mg total) by mouth daily. (Patient taking differently: Take 360 mg by mouth every morning. ) 90 capsule 3   Facility-Administered Medications Prior to Visit  Medication Dose Route Frequency Provider Last Rate Last Dose  . heparin 6,000 Units in sodium chloride irrigation 0.9 % 500 mL irrigation   Irrigation Once Rolm Bookbinder, MD      . sodium chloride 0.9 % injection 10 mL  10 mL Intracatheter PRN Ladell Pier, MD   10 mL at 07/18/12 1248     Allergies:   Pindolol; Iodine; Shellfish allergy; Vibramycin; Doxycycline; Levofloxacin; Multaq; and Vicodin   Social History   Social History  . Marital Status: Married    Spouse Name: N/A  . Number of Children: 2  . Years of Education: N/A   Occupational History  . Retired     Event organiser,     Social History Main Topics  . Smoking status: Never Smoker   . Smokeless tobacco: Never Used  . Alcohol Use: No  . Drug Use: No  . Sexual Activity: Not Asked   Other Topics Concern  . None   Social History Narrative   Married     Family History:  The patient's family history includes CAD in her mother; Heart disease in her mother; Stroke in her sister. There is no history of Cancer or Diabetes.   ROS:   Please see the history of present illness.    ROS All other systems reviewed and are negative.   PHYSICAL EXAM:   VS:  BP 104/52 mmHg  Pulse 70  Ht 5' (1.524 m)  Wt 59.875 kg (132 lb)  BMI 25.78 kg/m2   GEN: Well nourished, well developed, in no acute distress HEENT: normal Neck: no JVD, carotid bruits, or masses Cardiac: RRR; no murmurs, rubs, or gallops,no edema, 2 subclavian pacemaker site Respiratory:  clear to auscultation bilaterally, normal work of breathing GI: soft, nontender, nondistended, + BS MS: no deformity or atrophy Skin: warm and dry,  no rash Neuro:  Alert and Oriented x 3, Strength and sensation are intact Psych: euthymic mood, full affect  Wt Readings from Last 3 Encounters:  10/20/15 59.875 kg (132 lb)  10/11/15 66.6 kg (146 lb 13.2 oz)  09/29/15 61.689 kg (136 lb)      Studies/Labs Reviewed:   EKG:  EKG is not ordered today.  There is atrial paced ventricular sensed rhythm on electrogram today  Recent Labs: 10/05/2015: ALT 11*; TSH 1.986 10/11/2015: Hemoglobin 11.7*; Platelets 229 10/12/2015: BUN 6; Creatinine, Ser 0.73; Magnesium 1.5*; Potassium 3.9; Sodium 135   Lipid Panel    Component Value Date/Time   CHOL 115 04/10/2012 0520   TRIG 80 04/10/2012 0520   HDL 43 05/15/2008 0620   CHOLHDL 3.3 05/15/2008 0620   VLDL 17 05/15/2008 0620   LDLCALC  05/15/2008 0620    82        Total Cholesterol/HDL:CHD Risk Coronary Heart Disease Risk Table                     Men   Women  1/2 Average Risk   3.4   3.3    Additional studies/ records that were reviewed today include:  Records from recent hospitalization    ASSESSMENT:    1. PAF (paroxysmal atrial fibrillation) ([place])   2. Atrial flutter with rapid ventricular response ([place])   3. PAT (paroxysmal atrial tachycardia) ([place])   4. Sinus brady-tachy syndrome (HCC)   5. Cardiac pacemaker      PLAN:  In order of problems listed above:  1. AFib: This has been well rate controlled on diltiazem alone. The periods of rapid ventricular rates were in the setting of more organized atrial arrhythmia such as ectopic tachycardia or atrial flutter. We'll discontinue the digoxin due to its high risk of side effects. CHADSVasc 4 but unable to take anticoagulation due to recurrent hematuria. 2. Aflutter: This generally becomes a problem during periods of high adrenergic tone such as surgery, bowel obstruction, cessation of oral medications. Ablation has been considered but would be of limited benefit since she would still have atrial fibrillation and since her life  expectancy is primarily limited by her abdominal malignancy. 3. Ectopic/reentrant atrial tachycardia seems to be the arrhythmia that acts caused her most grief when she is ill. It is also the hardest to rate control. It is unlikely that the digoxin would be beneficial for this arrhythmia. 4. SSS: When she is not in an atrial arrhythmia, she has essentially 100% atrial pacing. Sensed P waves are small, but this does not appear to be interfering much with device function. 5. PM: Is most likely that her recent ventricular lead polarity switch was related to the use of cautery during her abdominal procedure. Otherwise ventricular lead performance is normal. She is not pacemaker dependent (has normal AV conduction, in fact her excellent AV conduction is a problem during her atrial arrhythmia). No plan for changes and pacemaker settings or device revision.    Medication Adjustments/Labs and Tests Ordered: Current medicines are reviewed at length with the patient today.  Concerns regarding medicines are outlined above.  Medication changes, Labs and Tests ordered today are listed in the Patient Instructions below. Patient Instructions  Dr Sallyanne Kuster has recommended making the following medication changes: 1. STOP Digoxin  Dr Sallyanne Kuster recommends that you schedule a follow-up appointment in 6 months with a pacer check. You will receive a reminder letter in the mail two months in advance. If you don't receive a letter, please call our office to schedule the follow-up appointment.  If you need a refill on your cardiac medications before your next appointment, please call your pharmacy.      Mikael Spray, MD  10/21/2015 4:35 PM    [facility] [address], [place], [place]  [address] Phone: [phone]; Fax: [phone]

## 2015-10-20 NOTE — Patient Instructions (Addendum)
Dr Sallyanne Kuster has recommended making the following medication changes: 1. STOP Digoxin  Dr Sallyanne Kuster recommends that you schedule a follow-up appointment in 6 months with a pacer check. You will receive a reminder letter in the mail two months in advance. If you don't receive a letter, please call our office to schedule the follow-up appointment.  If you need a refill on your cardiac medications before your next appointment, please call your pharmacy.

## 2015-10-21 ENCOUNTER — Other Ambulatory Visit: Payer: Self-pay | Admitting: Cardiovascular Disease

## 2015-10-21 ENCOUNTER — Other Ambulatory Visit: Payer: Self-pay | Admitting: General Surgery

## 2015-10-21 ENCOUNTER — Ambulatory Visit
Admission: RE | Admit: 2015-10-21 | Discharge: 2015-10-21 | Disposition: A | Discharge status: Home / Self Care | Payer: Medicare Other | Source: Ambulatory Visit | Attending: General Surgery | Admitting: General Surgery

## 2015-10-21 ENCOUNTER — Ambulatory Visit: Payer: Medicare Other | Admitting: Nurse Practitioner

## 2015-10-21 ENCOUNTER — Other Ambulatory Visit: Payer: Medicare Other

## 2015-10-21 DIAGNOSIS — Z9889 Other specified postprocedural states: Secondary | ICD-10-CM

## 2015-10-21 LAB — CUP PACEART INCLINIC DEVICE CHECK
Implantable Lead Implant Date: 20100216
Implantable Lead Location: 753860
Lead Channel Setting Pacing Amplitude: 2.5 V
Lead Channel Setting Pacing Pulse Width: 0.4 ms
MDC IDC LEAD IMPLANT DT: 20100216
MDC IDC LEAD LOCATION: 753859
MDC IDC PG SERIAL: 2195755
MDC IDC SESS DTM: 20170328130633
MDC IDC SET LEADCHNL RA PACING AMPLITUDE: 2 V
MDC IDC SET LEADCHNL RV SENSING SENSITIVITY: 1.5 mV
Pulse Gen Model: 5826

## 2015-10-21 NOTE — Telephone Encounter (Signed)
REFILL 

## 2015-10-27 ENCOUNTER — Encounter: Payer: Self-pay | Admitting: Cardiovascular Disease

## 2015-11-21 ENCOUNTER — Other Ambulatory Visit: Payer: Self-pay | Admitting: *Deleted

## 2015-11-21 ENCOUNTER — Ambulatory Visit (HOSPITAL_BASED_OUTPATIENT_CLINIC_OR_DEPARTMENT_OTHER): Payer: Medicare Other | Admitting: Nurse Practitioner

## 2015-11-21 ENCOUNTER — Telehealth: Payer: Self-pay | Admitting: Nurse Practitioner

## 2015-11-21 ENCOUNTER — Other Ambulatory Visit (HOSPITAL_BASED_OUTPATIENT_CLINIC_OR_DEPARTMENT_OTHER): Payer: Medicare Other

## 2015-11-21 VITALS — BP 137/58 | HR 74 | Temp 98.2°F | Resp 18 | Ht 60.0 in | Wt 130.7 lb

## 2015-11-21 DIAGNOSIS — C671 Malignant neoplasm of dome of bladder: Secondary | ICD-10-CM

## 2015-11-21 DIAGNOSIS — C569 Malignant neoplasm of unspecified ovary: Secondary | ICD-10-CM

## 2015-11-21 DIAGNOSIS — C679 Malignant neoplasm of bladder, unspecified: Secondary | ICD-10-CM | POA: Diagnosis not present

## 2015-11-21 DIAGNOSIS — C7911 Secondary malignant neoplasm of bladder: Secondary | ICD-10-CM

## 2015-11-21 DIAGNOSIS — C561 Malignant neoplasm of right ovary: Secondary | ICD-10-CM | POA: Diagnosis not present

## 2015-11-21 LAB — URINALYSIS, MICROSCOPIC - CHCC
BILIRUBIN (URINE): NEGATIVE
Glucose: NEGATIVE mg/dL
Ketones: NEGATIVE mg/dL
Nitrite: NEGATIVE
Protein: 300 mg/dL
SPECIFIC GRAVITY, URINE: 1.02 (ref 1.003–1.035)
UROBILINOGEN UR: 0.2 mg/dL (ref 0.2–1)
pH: 6 (ref 4.6–8.0)

## 2015-11-21 LAB — CBC WITH DIFFERENTIAL/PLATELET
BASO%: 0.4 % (ref 0.0–2.0)
BASOS ABS: 0 10*3/uL (ref 0.0–0.1)
EOS%: 2.9 % (ref 0.0–7.0)
Eosinophils Absolute: 0.2 10*3/uL (ref 0.0–0.5)
HCT: 37 % (ref 34.8–46.6)
HEMOGLOBIN: 12 g/dL (ref 11.6–15.9)
LYMPH%: 13.6 % — AB (ref 14.0–49.7)
MCH: 30.7 pg (ref 25.1–34.0)
MCHC: 32.4 g/dL (ref 31.5–36.0)
MCV: 94.5 fL (ref 79.5–101.0)
MONO#: 0.5 10*3/uL (ref 0.1–0.9)
MONO%: 7.7 % (ref 0.0–14.0)
NEUT#: 4.8 10*3/uL (ref 1.5–6.5)
NEUT%: 75.4 % (ref 38.4–76.8)
Platelets: 207 10*3/uL (ref 145–400)
RBC: 3.92 10*6/uL (ref 3.70–5.45)
RDW: 14.2 % (ref 11.2–14.5)
WBC: 6.3 10*3/uL (ref 3.9–10.3)
lymph#: 0.9 10*3/uL (ref 0.9–3.3)

## 2015-11-21 NOTE — Telephone Encounter (Signed)
GAVE PT APPT & AVS °

## 2015-11-21 NOTE — Progress Notes (Addendum)
[facility] OFFICE PROGRESS NOTE   Diagnosis:  Ovarian cancer  INTERVAL HISTORY:   Victoria [name] returns for follow-up. She underwent transurethral resection of a large bladder tumor on 09/29/2015. Pathology showed features of urothelial carcinoma; secondary involvement from the previous ovarian serous carcinoma also a possibility. Muscularis propria was involved by tumor.   She was hospitalized on 10/04/2015 with nausea/vomiting, abdominal pain. CT scan showed a small bowel obstruction. On 10/09/2015 she underwent an exploratory laparotomy, lysis of adhesions. She was discharged 10/12/2015.  She feels well. She denies any hematuria. She has intermittent mild dysuria. She completed a course of amoxicillin recently. She denies fever. She has a good appetite and overall good energy level. She is no longer having right posterior chest wall pain.  Objective:  Vital signs in last 24 hours:  Blood pressure 137/58, pulse 74, temperature 98.2 F (36.8 C), temperature source Oral, resp. rate 18, height 5' (1.524 m), weight 130 lb 11.2 oz (59.285 kg), SpO2 98 %.    HEENT: No thrush or ulcers. Lymphatics: No palpable cervical, supraclavicular, axillary or inguinal lymph nodes. Resp: Decreased breath sounds right lower chest. No respiratory distress. Cardio: Regular rate and rhythm. GI: Abdomen is soft and nontender. No hepatomegaly. 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 Vascular: No leg edema.  Lab Results:  Lab Results  Component Value Date   WBC 6.3 11/21/2015   HGB 12.0 11/21/2015   HCT 37.0 11/21/2015   MCV 94.5 11/21/2015   PLT 207 11/21/2015   NEUTROABS 4.8 11/21/2015    Imaging:  No results found.  Medications: I have reviewed the patient's current medications.  Assessment/Plan: 1. Ovarian cancer-presenting with a rectal mass , status post sigmoidoscopy 03/13/2012 with findings of a 5 cm malignant appearing mass with central ulceration in the  rectum 2-3 cm proximal to the anal verge. Pathology showed moderate to poorly differentiated adenocarcinoma with ulceration and prolapse changes. By immunohistochemistry the malignant cells were positive for cytokeratin 7, estrogen receptor and WT-1. They were negative for cytokeratin 20 and CDX-2. This immunohistochemical profile was strongly suggestive of a gynecologic primary. -Status post 3 cycles of Taxol/carboplatin chemotherapy with normalization of the CA 125, restaging CT 08/07/2012 with marked improvement in the pelvic masses and no evidence of progressive ovarian cancer. She completed cycle 4 of Taxol/carboplatin beginning 08/08/2012. 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 She completed cycle 6 Taxol/carboplatin beginning 11/21/2012. 2. History of Atrial fibrillation  3. Status post Port-A-Cath placement 05/04/2012. Port-A-Cath removal 02/21/2013 4. 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 5. Admission 09/02/2013 with gross hematuria-right pelvic mass with involvement of the bladder confirmed on CT with a cystoscopy confirming a large tumor in the bladder with associated bleeding, status post TURBT and fulguration, pathology consistent with a high-grade carcinoma. Immunohistochemical stains returned positive for cytokeratin 7 and WT-1, and negative for cytokeratin 20 and cytokeratin 903. She completed a course of palliative radiation 09/13/2013 through 10/22/2013.  Status post transurethral resection of a greater than 5 cm tumor at the right dome of the bladder 06/27/2014, pathology confirmed high-grade poorly differentiated carcinoma  Status post TURBT large contiguous ovarian carcinoma with invasion in the bladder 11/18/2014  Status post TURBT of a large bladder tumor 09/29/2015; there were features of urothelial  carcinoma. Secondary involvement from the previous ovarian serous carcinoma also a possibility. Muscularis propria involved by tumor. 6. History  of anemia secondary to bleeding. She continues iron. 7. Right posterior chest wall pain and tenderness-etiology unclear; denies pain 11/21/2015. 8. Hospitalized with small bowel obstruction 10/04/2015 status post exploratory laparotomy, lysis of adhesions 10/09/2015.   Disposition: Victoria [name] appears stable. No hematuria at present. Hemoglobin is stable. We will follow-up on the urine culture from today. She will return for a follow-up visit and CBC in 4 months. She will contact the office in the interim with any problems.  Patient seen with Dr. Benay Spice.    Ned Card ANP/GNP-BC   11/21/2015  10:50 AM  This was a shared visit with Ned Card. Victoria [name] has recovered from the small bowel obstruction. She underwent transurethral resection of a recurrent bladder mass on 09/29/2015. The hemoglobin looks good today. We will follow-up on the urine culture from today.  Julieanne Manson, M.D.

## 2015-11-22 LAB — CANCER ANTIGEN 125 (PARALLEL TESTING): CA 125: 22 U/mL (ref <35)

## 2015-11-22 LAB — CA 125: Cancer Antigen (CA) 125: 20 U/mL (ref 0.0–38.1)

## 2015-11-23 LAB — URINE CULTURE

## 2015-11-24 ENCOUNTER — Telehealth: Payer: Self-pay

## 2015-11-24 ENCOUNTER — Other Ambulatory Visit: Payer: Self-pay | Admitting: Nurse Practitioner

## 2015-11-24 MED ORDER — AMOXICILLIN 500 MG PO TABS: 500.0000 mg | ORAL_TABLET | Freq: Two times a day (BID) | ORAL | Status: DC

## 2015-11-24 NOTE — Telephone Encounter (Signed)
Called to inform patient of urine culture results, left message for patient to call back. Patient allergic to levofloxacin, Called Dr.Dalhstedt's office to see what pt was most recently taking for this issue. Per Mickel Baas his nurse she was on [place]. Informed Ned Card, NP.   Called patient back, Patient states she has taken amoxicillin recently prescribed by Dr. Zannie Cove and has taken it in the past with no issues, states she did have nausea with doxycycline but no other issues with that and has never had any issues with amoxicillin or penicillin. Informed patient we would send a prescription to her pharmacy  Informed Marlynn Perking who will send Amoxicillin to pharmacy.   Routed urine culture results to Dr.Dalhstedt.

## 2015-11-24 NOTE — Telephone Encounter (Signed)
-----   Message from Owens Shark, NP sent at 11/24/2015 10:33 AM EDT ----- Please let her know the urine culture confirmed an infection. Please send a prescription to her pharmacy for Cipro 500 mg twice daily for 5 days and forward a copy of the culture report to Dr[facility]. Thanks

## 2015-11-25 ENCOUNTER — Telehealth: Payer: Self-pay | Admitting: *Deleted

## 2015-11-25 NOTE — Telephone Encounter (Signed)
Called pt with normal CA125 result. She voiced understanding.

## 2015-11-25 NOTE — Telephone Encounter (Signed)
-----   Message from Ladell Pier, MD sent at 11/23/2015  8:27 AM EDT ----- Please call patient, ca125 is normal

## 2016-03-10 ENCOUNTER — Ambulatory Visit
Admission: RE | Admit: 2016-03-10 | Discharge: 2016-03-10 | Disposition: A | Discharge status: Home / Self Care | Payer: Medicare Other | Source: Ambulatory Visit | Attending: Internal Medicine | Admitting: Internal Medicine

## 2016-03-10 ENCOUNTER — Other Ambulatory Visit (HOSPITAL_COMMUNITY): Payer: Self-pay | Admitting: Internal Medicine

## 2016-03-10 DIAGNOSIS — C569 Malignant neoplasm of unspecified ovary: Secondary | ICD-10-CM

## 2016-03-10 DIAGNOSIS — R059 Cough, unspecified: Secondary | ICD-10-CM

## 2016-03-10 DIAGNOSIS — R05 Cough: Secondary | ICD-10-CM

## 2016-03-10 MED ORDER — IOPAMIDOL (ISOVUE-300) INJECTION 61%
75.0000 mL | Freq: Once | INTRAVENOUS | Status: AC | PRN
  Administered 2016-03-10: 75 mL via INTRAVENOUS

## 2016-03-12 ENCOUNTER — Ambulatory Visit (HOSPITAL_COMMUNITY): Payer: Medicare Other

## 2016-03-13 ENCOUNTER — Telehealth: Payer: Self-pay | Admitting: Oncology

## 2016-03-13 NOTE — Telephone Encounter (Signed)
S/w pt, advised 8/28 appt chgd to 9/19 @ 12p due to md pal. Pt verbalized understanding.

## 2016-03-18 ENCOUNTER — Other Ambulatory Visit: Payer: Self-pay | Admitting: Internal Medicine

## 2016-03-23 ENCOUNTER — Other Ambulatory Visit: Payer: Medicare Other

## 2016-03-23 ENCOUNTER — Ambulatory Visit: Payer: Medicare Other | Admitting: Oncology

## 2016-04-02 ENCOUNTER — Other Ambulatory Visit: Payer: Self-pay | Admitting: Urology

## 2016-04-08 ENCOUNTER — Telehealth: Payer: Self-pay

## 2016-04-08 NOTE — Telephone Encounter (Signed)
Faxed signed form to [employer] testing.

## 2016-04-12 NOTE — Patient Instructions (Signed)
Naydelin YARROW REBELLO  04/12/2016   Your procedure is scheduled on: 04/15/2016    Report to [facility] Main  Entrance take [place]  elevators to 3rd floor to  [place] at   [place] AM.  Call this number if you have problems the morning of surgery [phone]   Remember: ONLY 1 PERSON MAY GO WITH YOU TO SHORT STAY TO GET  READY MORNING OF YOUR SURGERY.  Do not eat food or drink liquids :After Midnight.     Take these medicines the morning of surgery with A SIP OF WATER: Cardizem                                You may not have any metal on your body including hair pins and              piercings  Do not wear jewelry, make-up, lotions, powders or perfumes, deodorant             Do not wear nail polish.  Do not shave  48 hours prior to surgery.                 Do not bring valuables to the hospital. [place].  Contacts, dentures or bridgework may not be worn into surgery.  Leave suitcase in the car. After surgery it may be brought to your room.       Special Instructions: N/A              Please read over the following fact sheets you were given: _____________________________________________________________________             [facility] - Preparing for Surgery Before surgery, you can play an important role.  Because skin is not sterile, your skin needs to be as free of germs as possible.  You can reduce the number of germs on your skin by washing with CHG (chlorahexidine gluconate) soap before surgery.  CHG is an antiseptic cleaner which kills germs and bonds with the skin to continue killing germs even after washing. Please DO NOT use if you have an allergy to CHG or antibacterial soaps.  If your skin becomes reddened/irritated stop using the CHG and inform your nurse when you arrive at Short Stay. Do not shave (including legs and underarms) for at least 48 hours prior to the first CHG shower.  You may shave  your face/neck. Please follow these instructions carefully:  1.  Shower with CHG Soap the night before surgery and the  morning of Surgery.  2.  If you choose to wash your hair, wash your hair first as usual with your  normal  shampoo.  3.  After you shampoo, rinse your hair and body thoroughly to remove the  shampoo.                           4.  Use CHG as you would any other liquid soap.  You can apply chg directly  to the skin and wash                       Gently with a scrungie or clean washcloth.  5.  Apply the CHG Soap to your body ONLY FROM THE NECK DOWN.   Do not use on face/ open                           Wound or open sores. Avoid contact with eyes, ears mouth and genitals (private parts).                       Wash face,  Genitals (private parts) with your normal soap.             6.  Wash thoroughly, paying special attention to the area where your surgery  will be performed.  7.  Thoroughly rinse your body with warm water from the neck down.  8.  DO NOT shower/wash with your normal soap after using and rinsing off  the CHG Soap.                9.  Pat yourself dry with a clean towel.            10.  Wear clean pajamas.            11.  Place clean sheets on your bed the night of your first shower and do not  sleep with pets. Day of Surgery : Do not apply any lotions/deodorants the morning of surgery.  Please wear clean clothes to the hospital/surgery center.  FAILURE TO FOLLOW THESE INSTRUCTIONS MAY RESULT IN THE CANCELLATION OF YOUR SURGERY PATIENT SIGNATURE_________________________________  NURSE SIGNATURE__________________________________  ________________________________________________________________________

## 2016-04-13 ENCOUNTER — Encounter (HOSPITAL_COMMUNITY)
Admission: RE | Admit: 2016-04-13 | Discharge: 2016-04-13 | Disposition: A | Discharge status: Home / Self Care | Payer: Medicare Other | Source: Ambulatory Visit | Attending: Urology | Admitting: Urology

## 2016-04-13 ENCOUNTER — Ambulatory Visit (HOSPITAL_BASED_OUTPATIENT_CLINIC_OR_DEPARTMENT_OTHER): Payer: Medicare Other | Admitting: Oncology

## 2016-04-13 ENCOUNTER — Other Ambulatory Visit: Payer: Self-pay | Admitting: *Deleted

## 2016-04-13 ENCOUNTER — Other Ambulatory Visit (HOSPITAL_BASED_OUTPATIENT_CLINIC_OR_DEPARTMENT_OTHER): Payer: Medicare Other

## 2016-04-13 ENCOUNTER — Encounter (HOSPITAL_COMMUNITY): Payer: Self-pay

## 2016-04-13 VITALS — BP 114/62 | HR 95 | Temp 97.8°F | Resp 16 | Ht 60.0 in | Wt 122.2 lb

## 2016-04-13 DIAGNOSIS — C7911 Secondary malignant neoplasm of bladder: Secondary | ICD-10-CM

## 2016-04-13 DIAGNOSIS — Z9049 Acquired absence of other specified parts of digestive tract: Secondary | ICD-10-CM | POA: Diagnosis not present

## 2016-04-13 DIAGNOSIS — D509 Iron deficiency anemia, unspecified: Secondary | ICD-10-CM | POA: Diagnosis not present

## 2016-04-13 DIAGNOSIS — C569 Malignant neoplasm of unspecified ovary: Secondary | ICD-10-CM

## 2016-04-13 DIAGNOSIS — R310 Gross hematuria: Secondary | ICD-10-CM

## 2016-04-13 DIAGNOSIS — C20 Malignant neoplasm of rectum: Secondary | ICD-10-CM

## 2016-04-13 DIAGNOSIS — Z8543 Personal history of malignant neoplasm of ovary: Secondary | ICD-10-CM | POA: Diagnosis not present

## 2016-04-13 DIAGNOSIS — Z950 Presence of cardiac pacemaker: Secondary | ICD-10-CM | POA: Diagnosis not present

## 2016-04-13 DIAGNOSIS — D500 Iron deficiency anemia secondary to blood loss (chronic): Secondary | ICD-10-CM

## 2016-04-13 DIAGNOSIS — C561 Malignant neoplasm of right ovary: Secondary | ICD-10-CM

## 2016-04-13 DIAGNOSIS — Z933 Colostomy status: Secondary | ICD-10-CM | POA: Diagnosis not present

## 2016-04-13 DIAGNOSIS — Z90710 Acquired absence of both cervix and uterus: Secondary | ICD-10-CM | POA: Diagnosis not present

## 2016-04-13 DIAGNOSIS — I4891 Unspecified atrial fibrillation: Secondary | ICD-10-CM | POA: Diagnosis not present

## 2016-04-13 DIAGNOSIS — I10 Essential (primary) hypertension: Secondary | ICD-10-CM | POA: Diagnosis not present

## 2016-04-13 DIAGNOSIS — Z90722 Acquired absence of ovaries, bilateral: Secondary | ICD-10-CM | POA: Diagnosis not present

## 2016-04-13 DIAGNOSIS — Z9221 Personal history of antineoplastic chemotherapy: Secondary | ICD-10-CM | POA: Diagnosis not present

## 2016-04-13 DIAGNOSIS — Z79899 Other long term (current) drug therapy: Secondary | ICD-10-CM | POA: Diagnosis not present

## 2016-04-13 DIAGNOSIS — Z923 Personal history of irradiation: Secondary | ICD-10-CM | POA: Diagnosis not present

## 2016-04-13 HISTORY — DX: Nocturia: R35.1

## 2016-04-13 LAB — COMPREHENSIVE METABOLIC PANEL
ALBUMIN: 3 g/dL — AB (ref 3.5–5.0)
ALK PHOS: 104 U/L (ref 40–150)
ALT: 12 U/L (ref 0–55)
AST: 17 U/L (ref 5–34)
Anion Gap: 9 mEq/L (ref 3–11)
BUN: 14.4 mg/dL (ref 7.0–26.0)
CALCIUM: 9 mg/dL (ref 8.4–10.4)
CHLORIDE: 99 meq/L (ref 98–109)
CO2: 26 mEq/L (ref 22–29)
CREATININE: 0.9 mg/dL (ref 0.6–1.1)
EGFR: 61 mL/min/{1.73_m2} — ABNORMAL LOW (ref >90)
GLUCOSE: 113 mg/dL (ref 70–140)
POTASSIUM: 4.6 meq/L (ref 3.5–5.1)
SODIUM: 134 meq/L — AB (ref 136–145)
Total Bilirubin: 0.47 mg/dL (ref 0.20–1.20)
Total Protein: 6.1 g/dL — ABNORMAL LOW (ref 6.4–8.3)

## 2016-04-13 LAB — CBC WITH DIFFERENTIAL/PLATELET
BASO%: 0.3 % (ref 0.0–2.0)
Basophils Absolute: 0 10*3/uL (ref 0.0–0.1)
EOS%: 0.4 % (ref 0.0–7.0)
Eosinophils Absolute: 0 10*3/uL (ref 0.0–0.5)
HEMATOCRIT: 35.7 % (ref 34.8–46.6)
HEMOGLOBIN: 11.8 g/dL (ref 11.6–15.9)
LYMPH#: 0.8 10*3/uL — AB (ref 0.9–3.3)
LYMPH%: 6.9 % — ABNORMAL LOW (ref 14.0–49.7)
MCH: 29.7 pg (ref 25.1–34.0)
MCHC: 33 g/dL (ref 31.5–36.0)
MCV: 89.9 fL (ref 79.5–101.0)
MONO#: 0.8 10*3/uL (ref 0.1–0.9)
MONO%: 7.5 % (ref 0.0–14.0)
NEUT%: 84.9 % — ABNORMAL HIGH (ref 38.4–76.8)
NEUTROS ABS: 9.3 10*3/uL — AB (ref 1.5–6.5)
Platelets: 256 10*3/uL (ref 145–400)
RBC: 3.97 10*6/uL (ref 3.70–5.45)
RDW: 14.5 % (ref 11.2–14.5)
WBC: 11 10*3/uL — AB (ref 3.9–10.3)

## 2016-04-13 NOTE — Progress Notes (Addendum)
Cbc with dif, cmet 04-13-16 cancer center on chart ekg 10-06-15 ekg epic lov cardio dr croitoru 10-20-15 epic Echo 06-23-15 epic Last device check 10-06-15 epic Chest ct 03-10-16 epic

## 2016-04-13 NOTE — Patient Instructions (Addendum)
Lamoyne SHARRONE CUPPS  04/13/2016   Your procedure is scheduled on: 04-15-16  Report to [facility] Main  Entrance take [facility]  elevators to 3rd floor to  [place] at 715 AM.  Call this number if you have problems the morning of surgery [phone]   Remember: ONLY 1 PERSON MAY GO WITH YOU TO SHORT STAY TO GET  READY MORNING OF [place].  Do not eat food or drink liquids :After Midnight.     Take these medicines the morning of surgery with A SIP OF WATER: DILTIAZEM (CARDIZEM)                               You may not have any metal on your body including hair pins and              piercings  Do not wear jewelry, make-up, lotions, powders or perfumes, deodorant             Do not wear nail polish.  Do not shave  48 hours prior to surgery.              Men may shave face and neck.   Do not bring valuables to the hospital. [place].  Contacts, dentures or bridgework may not be worn into surgery.  Leave suitcase in the car. After surgery it may be brought to your room.                  Please read over the following fact sheets you were given: _____________________________________________________________________             [facility] - Preparing for Surgery Before surgery, you can play an important role.  Because skin is not sterile, your skin needs to be as free of germs as possible.  You can reduce the number of germs on your skin by washing with CHG (chlorahexidine gluconate) soap before surgery.  CHG is an antiseptic cleaner which kills germs and bonds with the skin to continue killing germs even after washing. Please DO NOT use if you have an allergy to CHG or antibacterial soaps.  If your skin becomes reddened/irritated stop using the CHG and inform your nurse when you arrive at Short Stay. Do not shave (including legs and underarms) for at least 48 hours prior to the first CHG shower.  You may shave  your face/neck. Please follow these instructions carefully:  1.  Shower with CHG Soap the night before surgery and the  morning of Surgery.  2.  If you choose to wash your hair, wash your hair first as usual with your  normal  shampoo.  3.  After you shampoo, rinse your hair and body thoroughly to remove the  shampoo.                           4.  Use CHG as you would any other liquid soap.  You can apply chg directly  to the skin and wash                       Gently with a scrungie or clean washcloth.  5.  Apply the CHG Soap  to your body ONLY FROM THE NECK DOWN.   Do not use on face/ open                           Wound or open sores. Avoid contact with eyes, ears mouth and genitals (private parts).                       Wash face,  Genitals (private parts) with your normal soap.             6.  Wash thoroughly, paying special attention to the area where your surgery  will be performed.  7.  Thoroughly rinse your body with warm water from the neck down.  8.  DO NOT shower/wash with your normal soap after using and rinsing off  the CHG Soap.                9.  Pat yourself dry with a clean towel.            10.  Wear clean pajamas.            11.  Place clean sheets on your bed the night of your first shower and do not  sleep with pets. Day of Surgery : Do not apply any lotions/deodorants the morning of surgery.  Please wear clean clothes to the hospital/surgery center.  FAILURE TO FOLLOW THESE INSTRUCTIONS MAY RESULT IN THE CANCELLATION OF YOUR SURGERY PATIENT SIGNATURE_________________________________  NURSE SIGNATURE__________________________________  ________________________________________________________________________

## 2016-04-13 NOTE — Progress Notes (Signed)
[facility] OFFICE PROGRESS NOTE   Diagnosis: Ovarian cancer  INTERVAL HISTORY:   [name] returns as scheduled. She had an episode of gross hematuria in August. She saw Dr. Diona Fanti on 03/31/2016. A cystoscopy revealed a mass at the right posterior dome of the bladder with no significant bleeding. The mass measured over 6 cm. She is scheduled for a transurethral resection procedure later this week. She denies recurrent bleeding. No other complaint.  Objective:  Vital signs in last 24 hours:  Blood pressure 114/62, pulse 95, temperature 97.8 F (36.6 C), temperature source Oral, resp. rate 16, height 5' (1.524 m), weight 122 lb 3.2 oz (55.4 kg), SpO2 98 %.    Resp: Decreased breath sounds at the right chest, no respiratory distress Cardio: Regular rate and rhythm GI: No hepatomegaly, left lower quadrant colostomy, no mass Vascular: No leg edema   Lab Results:  Lab Results  Component Value Date   WBC 11.0 (H) 04/13/2016   HGB 11.8 04/13/2016   HCT 35.7 04/13/2016   MCV 89.9 04/13/2016   PLT 256 04/13/2016   NEUTROABS 9.3 (H) 04/13/2016     Medications: I have reviewed the patient's current medications.  Assessment/Plan: 1. Ovarian cancer-presenting with a rectal mass , status post sigmoidoscopy 03/13/2012 with findings of a 5 cm malignant appearing mass with central ulceration in the rectum 2-3 cm proximal to the anal verge. Pathology showed moderate to poorly differentiated adenocarcinoma with ulceration and prolapse changes. By immunohistochemistry the malignant cells were positive for cytokeratin 7, estrogen receptor and WT-1. They were negative for cytokeratin 20 and CDX-2. This immunohistochemical profile was strongly suggestive of a gynecologic primary. -Status post 3 cycles of Taxol/carboplatin chemotherapy with normalization of the CA 125, restaging CT 08/07/2012 with marked improvement in the pelvic masses and no evidence of progressive ovarian  cancer. She completed cycle 4 of Taxol/carboplatin beginning 08/08/2012. 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 She completed cycle 6 Taxol/carboplatin beginning 11/21/2012. 2. History of Atrial fibrillation  3. Status post Port-A-Cath placement 05/04/2012. Port-A-Cath removal 02/21/2013 4. 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 5. Admission 09/02/2013 with gross hematuria-right pelvic mass with involvement of the bladder confirmed on CT with a cystoscopy confirming a large tumor in the bladder with associated bleeding, status post TURBT and fulguration, pathology consistent with a high-grade carcinoma. Immunohistochemical stains returned positive for cytokeratin 7 and WT-1, and negative for cytokeratin 20 and cytokeratin 903. She completed a course of palliative radiation 09/13/2013 through 10/22/2013.  Status post transurethral resection of a greater than 5 cm tumor at the right dome of the bladder 06/27/2014, pathology confirmed high-grade poorly differentiated carcinoma  Status post TURBT large contiguous ovarian carcinoma with invasion in the bladder 11/18/2014  Status post TURBT of a large bladder tumor 09/29/2015; there were features of urothelial carcinoma. Secondary involvement from the previous ovarian serous carcinoma also a possibility. Muscularis propria involved by tumor.  Cystoscopy 03/31/2016 revealed a tumor at the posterior dome of the bladder, scheduled for TURBT 6. History of anemia secondary to bleeding. She continues iron. 7. Right posterior chest wall pain and tenderness-etiology unclear; denies pain 11/21/2015. 8. Hospitalized with small bowel obstruction 10/04/2015 status post exploratory laparotomy, lysis of adhesions 10/09/2015.     Disposition:  [name] had an episode of recurrent gross  hematuria last month. A cystoscopy confirmed progressive tumor in the bladder. She  is scheduled for cystoscopic resection later this week.  [name] indicated she does not wish to consider further systemic therapy. She would like to be discharged from the oncology clinic and follow-up here as needed.  She will continue follow-up with Dr. Diona Fanti. I am available to see her as needed.  [name], MD  04/13/2016  12:49 PM

## 2016-04-14 ENCOUNTER — Encounter (HOSPITAL_COMMUNITY)
Admission: RE | Admit: 2016-04-14 | Discharge: 2016-04-14 | Disposition: A | Discharge status: Home / Self Care | Payer: Medicare Other | Source: Ambulatory Visit | Attending: Urology | Admitting: Urology

## 2016-04-14 LAB — CA 125: Cancer Antigen (CA) 125: 32.4 U/mL (ref 0.0–38.1)

## 2016-04-14 LAB — CANCER ANTIGEN 125 (PARALLEL TESTING): CA 125: 41 U/mL — ABNORMAL HIGH (ref <35)

## 2016-04-14 NOTE — H&P (Signed)
H&P  Chief Complaint: Blood in urine  History of Present Illness: Victoria [name] is a 82 y.o. year old female with recurrent gross hematuria from a persistent GYN malignancy with bladder invasion. She has had 4 previous palliative resections--on 09/18/2103, 07/02/2014, 11/21/2014 and 10/13/2015.  She has symptomatic bleeding and was recently scoped in the office, with findings of recurrent large bladder mass >>5 cm. I have suggested repeat palliative resection. She agrees with the procedure. We have discussed the non-curative nature of the procedure as well as the risks involved, namely infection, anesthetic complications, bleeding, among others.   Past Medical History:  Diagnosis Date  . Bladder cancer [facility]) recurrent bladder tumor    secondarty to ovarian cancer--  palliative radiation 09-13-2013 to 10-22-2013  . Cardiac pacemaker in situ 2010   [place]-- PLACEMENT 09-10-2008  . Colostomy in place [facility])   . Diverticulosis   . Fluid collection (edema) in the arms, legs, hands and feet   . Gross hematuria    unable to anticoagulate  . Hyperlipidemia   . Hypertension   . Iron deficiency anemia   . Malignant neoplasm of ovary ([place]) ONOCOLOGIST--  DR Benay Spice  03-13-2012  w/ rectal mass   dx  primay ovarian cancer , +cytokeratin 7, estrogen receptor and WT-1--  chemotherapy and s/p omentectomy and bilateral oophorectomy03-05-2013  . Nausea 2015   chronic intermittent nausea after chemo  . Nocturia   . Paroxysmal atrial flutter ([facility])    CARDIOLOGIST--  DR CROITORU  . RBBB   . SSS (sick sinus syndrome) (H[facility])    S/P PACEMAKER    Past Surgical History:  Procedure Laterality Date  . ABDOMINAL HYSTERECTOMY  1972  . BLADDER NECK SUSPENSION  1986  . CARDIAC ELECTROPHYSIOLOGY MAPPING AND ABLATION  05-21-2008   dr Caryl Comes   a-flutter  . CARDIOVASCULAR STRESS TEST  07-17-2008  dr croitoru   normal myocardial perfusion w/ attenuation artifact in the anterior region of mycardium;  no ischemia or  infarct/scar/  low risk scan/  normal LV function and wall motion , ef  83%  . COLON RESECTION  04/04/2012   Procedure: COLON RESECTION;  Surgeon: Rolm Bookbinder, MD;  Location: [facility];  Service: General;  Laterality: N/A;  laparotomy with small bowel resection for obstruction with colostomy with gastrostomy tube placement.   . COLOSTOMY REVISION N/A 10/03/2012   Procedure: COLOSTOMY REVISION;  Surgeon: Rolm Bookbinder, MD;  Location: [facility];  Service: General;  Laterality: N/A;  COLOSTOMY REVISION, Sigmoid colectomy  . CYSTOSCOPY N/A 06/27/2014   Procedure: CYSTOSCOPY;  Surgeon: Jorja Loa, MD;  Location: [facility];  Service: Urology;  Laterality: N/A;  . CYSTOSCOPY N/A 11/18/2014   Procedure: CYSTOSCOPY;  Surgeon: Franchot Gallo, MD;  Location: [facility];  Service: Urology;  Laterality: N/A;  . ELECTROPHYSIOLOGY STUDY  09-26-2008  dr Caryl Comes  03-27-2012  dr croitoru   cardiac ,  no ablation in 2010/   2013  overdrive pacing atrial flutter  . LAPAROTOMY Bilateral 10/03/2012   Procedure: EXPLORATORY LAPAROTOMY;  Surgeon: Imagene Gurney A. Alycia Rossetti, MD;  Location: [facility];  Service: Gynecology;  Laterality: Bilateral;  WITH BSO, TUMOR DEBULKING  . LAPAROTOMY N/A 10/09/2015   Procedure: EXPLORATORY LAPAROTOMY LYSIS OF ADHESIONS;  Surgeon: Leighton Ruff, MD;  Location: [facility];  Service: General;  Laterality: N/A;  . PERMANENT PACEMAKER INSERTION  09-10-2008   [place]  . PORT-A-CATH REMOVAL N/A 02/21/2013   Procedure: REMOVAL PORT-A-CATH;  Surgeon: Rolm Bookbinder, MD;  Location: [facility];  Service: General;  Laterality: N/A;  . PORTACATH PLACEMENT  05/04/2012   Procedure: INSERTION PORT-A-CATH;  Surgeon: Rolm Bookbinder, MD;  Location: [facility];  Service: General;  Laterality: Right;  . Rib removed  1934   Pleurisy and pneumonia  . TRANSTHORACIC ECHOCARDIOGRAM  07-17-2008     ef >55%,  mild to moderate MR and TR,  mild  sclerotic AV without stenosis,    . TRANSURETHRAL RESECTION OF  BLADDER TUMOR N/A 06/27/2014   Procedure: TRANSURETHRAL RESECTION OF BLADDER TUMOR (TURBT);  Surgeon: Jorja Loa, MD;  Location: [facility];  Service: Urology;  Laterality: N/A;  . TRANSURETHRAL RESECTION OF BLADDER TUMOR N/A 11/18/2014   Procedure: TRANSURETHRAL RESECTION OF BLADDER TUMOR (TURBT);  Surgeon: Franchot Gallo, MD;  Location: [facility];  Service: Urology;  Laterality: N/A;  . TRANSURETHRAL RESECTION OF BLADDER TUMOR WITH GYRUS (TURBT-GYRUS) N/A 09/05/2013   Procedure: TRANSURETHRAL RESECTION OF BLADDER TUMOR WITH GYRUS (TURBT-GYRUS);  Surgeon: Fredricka Bonine, MD;  Location: [facility];  Service: Urology;  Laterality: N/A;  . TRANSURETHRAL RESECTION OF BLADDER TUMOR WITH GYRUS (TURBT-GYRUS) N/A 09/29/2015   Procedure: TRANSURETHRAL RESECTION OF BLADDER TUMOR WITH GYRUS (TURBT-GYRUS);  Surgeon: Franchot Gallo, MD;  Location: [facility];  Service: Urology;  Laterality: N/A;    Home Medications:  No prescriptions prior to admission.    Allergies:  Allergies  Allergen Reactions  . Pindolol Nausea Only and Swelling    Throat swells  . Iodine Other (See Comments)     TOPICAL IODINE///BLISTERS  . Shellfish Allergy Diarrhea and Nausea And Vomiting  . Vibramycin [Doxycycline Calcium] Nausea Only and Other (See Comments)    dizziness  . Doxycycline Nausea Only  . Levofloxacin Other (See Comments)    DIZZINESS  . Multaq [Dronedarone] Other (See Comments)    CAUSED SEVERE BURNING GI TRACT  . Vicodin [Hydrocodone-Acetaminophen] Other (See Comments)    dizziness    Family History  Problem Relation Age of Onset  . Heart disease Mother   . CAD Mother   . Stroke Sister   . Cancer Neg Hx   . Diabetes Neg Hx     Social History:  reports that she has never smoked. She has never used smokeless tobacco. She reports that she does not drink alcohol or use drugs.  ROS: A complete review of systems was performed.  All systems are negative except for pertinent  findings as noted.  Physical Exam:  Vital signs in last 24 hours:   General:  Alert and oriented, No acute distress HEENT: Normocephalic, atraumatic Neck: No JVD or lymphadenopathy Cardiovascular: Regular rate and rhythm Lungs: Clear bilaterally Abdomen: Soft, nontender, nondistended, no abdominal masses Back: No CVA tenderness Extremities: No edema Neurologic: Grossly intact  Laboratory Data:  No results found for this or any previous visit (from the past 24 hour(s)). No results found for this or any previous visit (from the past 240 hour(s)). Creatinine:  Recent Labs  04/13/16 1315  CREATININE 0.9    Radiologic Imaging: No results found.  Impression/Assessment:  Recurrent hematuria from GYN malignancy with bladder invasion  Plan:  Palliative TURBT  Jorja Loa 04/14/2016, 9:04 PM  Lillette Boxer. Bernhard Koskinen MD

## 2016-04-15 ENCOUNTER — Ambulatory Visit (HOSPITAL_COMMUNITY): Payer: Medicare Other | Admitting: Certified Registered Nurse Anesthetist

## 2016-04-15 ENCOUNTER — Observation Stay (HOSPITAL_COMMUNITY)
Admission: RE | Admit: 2016-04-15 | Discharge: 2016-04-16 | Disposition: A | Discharge status: Home / Self Care | Payer: Medicare Other | Source: Ambulatory Visit | Attending: Urology | Admitting: Urology

## 2016-04-15 ENCOUNTER — Encounter (HOSPITAL_COMMUNITY)
Admission: RE | Disposition: A | Discharge status: Home / Self Care | Payer: Self-pay | Source: Ambulatory Visit | Attending: Urology

## 2016-04-15 ENCOUNTER — Encounter (HOSPITAL_COMMUNITY): Payer: Self-pay

## 2016-04-15 DIAGNOSIS — C569 Malignant neoplasm of unspecified ovary: Secondary | ICD-10-CM | POA: Diagnosis present

## 2016-04-15 DIAGNOSIS — Z90722 Acquired absence of ovaries, bilateral: Secondary | ICD-10-CM | POA: Insufficient documentation

## 2016-04-15 DIAGNOSIS — Z923 Personal history of irradiation: Secondary | ICD-10-CM | POA: Insufficient documentation

## 2016-04-15 DIAGNOSIS — Z90710 Acquired absence of both cervix and uterus: Secondary | ICD-10-CM | POA: Insufficient documentation

## 2016-04-15 DIAGNOSIS — I4891 Unspecified atrial fibrillation: Secondary | ICD-10-CM | POA: Diagnosis not present

## 2016-04-15 DIAGNOSIS — D509 Iron deficiency anemia, unspecified: Secondary | ICD-10-CM | POA: Diagnosis not present

## 2016-04-15 DIAGNOSIS — Z79899 Other long term (current) drug therapy: Secondary | ICD-10-CM | POA: Insufficient documentation

## 2016-04-15 DIAGNOSIS — Z933 Colostomy status: Secondary | ICD-10-CM | POA: Insufficient documentation

## 2016-04-15 DIAGNOSIS — I10 Essential (primary) hypertension: Secondary | ICD-10-CM | POA: Diagnosis not present

## 2016-04-15 DIAGNOSIS — Z9049 Acquired absence of other specified parts of digestive tract: Secondary | ICD-10-CM | POA: Insufficient documentation

## 2016-04-15 DIAGNOSIS — Z8543 Personal history of malignant neoplasm of ovary: Secondary | ICD-10-CM | POA: Insufficient documentation

## 2016-04-15 DIAGNOSIS — C7911 Secondary malignant neoplasm of bladder: Principal | ICD-10-CM | POA: Insufficient documentation

## 2016-04-15 DIAGNOSIS — Z950 Presence of cardiac pacemaker: Secondary | ICD-10-CM | POA: Insufficient documentation

## 2016-04-15 DIAGNOSIS — Z9221 Personal history of antineoplastic chemotherapy: Secondary | ICD-10-CM | POA: Insufficient documentation

## 2016-04-15 HISTORY — PX: TRANSURETHRAL RESECTION OF BLADDER TUMOR: SHX2575

## 2016-04-15 LAB — HEMOGLOBIN AND HEMATOCRIT, BLOOD
HCT: 30.1 % — ABNORMAL LOW (ref 36.0–46.0)
HEMOGLOBIN: 10.1 g/dL — AB (ref 12.0–15.0)

## 2016-04-15 SURGERY — TURBT (TRANSURETHRAL RESECTION OF BLADDER TUMOR)
Anesthesia: General | Site: Urethra

## 2016-04-15 MED ORDER — CEFAZOLIN SODIUM-DEXTROSE 2-4 GM/100ML-% IV SOLN
2.0000 g | INTRAVENOUS | Status: AC
  Administered 2016-04-15: 2 g via INTRAVENOUS

## 2016-04-15 MED ORDER — ONDANSETRON HCL 4 MG/2ML IJ SOLN: 4.0000 mg | INTRAMUSCULAR | Status: DC | PRN

## 2016-04-15 MED ORDER — FENTANYL CITRATE (PF) 100 MCG/2ML IJ SOLN: 25.0000 ug | INTRAMUSCULAR | Status: DC | PRN

## 2016-04-15 MED ORDER — POTASSIUM CHLORIDE ER 10 MEQ PO TBCR
10.0000 meq | EXTENDED_RELEASE_TABLET | Freq: Every day | ORAL | Status: DC
  Filled 2016-04-15 (×3): qty 1

## 2016-04-15 MED ORDER — LACTATED RINGERS IV SOLN
INTRAVENOUS | Status: DC
  Administered 2016-04-15: 10:00:00 via INTRAVENOUS
  Administered 2016-04-15: 1000 mL via INTRAVENOUS

## 2016-04-15 MED ORDER — ONDANSETRON HCL 4 MG/2ML IJ SOLN
INTRAMUSCULAR | Status: DC | PRN
  Administered 2016-04-15: 4 mg via INTRAVENOUS

## 2016-04-15 MED ORDER — PROPOFOL 10 MG/ML IV BOLUS
INTRAVENOUS | Status: AC
  Filled 2016-04-15: qty 20

## 2016-04-15 MED ORDER — ONDANSETRON HCL 4 MG/2ML IJ SOLN
INTRAMUSCULAR | Status: AC
  Filled 2016-04-15: qty 2

## 2016-04-15 MED ORDER — LIDOCAINE HCL (CARDIAC) 20 MG/ML IV SOLN
INTRAVENOUS | Status: DC | PRN
Start: 2016-04-15 — End: 2016-04-15
  Administered 2016-04-15: 50 mg via INTRAVENOUS

## 2016-04-15 MED ORDER — PHENYLEPHRINE HCL 10 MG/ML IJ SOLN
INTRAMUSCULAR | Status: DC | PRN
  Administered 2016-04-15 (×3): 40 ug via INTRAVENOUS

## 2016-04-15 MED ORDER — OXYBUTYNIN CHLORIDE 5 MG PO TABS: 5.0000 mg | ORAL_TABLET | Freq: Three times a day (TID) | ORAL | Status: DC | PRN

## 2016-04-15 MED ORDER — LIDOCAINE 2% (20 MG/ML) 5 ML SYRINGE
INTRAMUSCULAR | Status: AC
  Filled 2016-04-15: qty 5

## 2016-04-15 MED ORDER — DILTIAZEM HCL ER COATED BEADS 360 MG PO CP24
360.0000 mg | ORAL_CAPSULE | Freq: Every day | ORAL | Status: DC
  Filled 2016-04-15: qty 1
  Filled 2016-04-15: qty 2

## 2016-04-15 MED ORDER — CEFAZOLIN SODIUM-DEXTROSE 2-4 GM/100ML-% IV SOLN
INTRAVENOUS | Status: AC
  Filled 2016-04-15: qty 100

## 2016-04-15 MED ORDER — SODIUM CHLORIDE 0.9 % IR SOLN
Status: DC | PRN
  Administered 2016-04-15: 30000 mL via INTRAVESICAL

## 2016-04-15 MED ORDER — FENTANYL CITRATE (PF) 100 MCG/2ML IJ SOLN
INTRAMUSCULAR | Status: AC
  Filled 2016-04-15: qty 2

## 2016-04-15 MED ORDER — MORPHINE SULFATE (PF) 10 MG/ML IV SOLN: 2.0000 mg | INTRAVENOUS | Status: DC | PRN

## 2016-04-15 MED ORDER — FENTANYL CITRATE (PF) 100 MCG/2ML IJ SOLN
INTRAMUSCULAR | Status: DC | PRN
  Administered 2016-04-15 (×4): 25 ug via INTRAVENOUS

## 2016-04-15 MED ORDER — SODIUM CHLORIDE 0.9 % IR SOLN
3000.0000 mL | Status: DC
  Administered 2016-04-15 (×2): 3000 mL

## 2016-04-15 MED ORDER — MORPHINE SULFATE (PF) 2 MG/ML IV SOLN: 2.0000 mg | INTRAVENOUS | Status: DC | PRN

## 2016-04-15 MED ORDER — SULFAMETHOXAZOLE-TRIMETHOPRIM 800-160 MG PO TABS: 1.0000 | ORAL_TABLET | Freq: Every day | ORAL | Status: DC

## 2016-04-15 MED ORDER — PROPOFOL 10 MG/ML IV BOLUS
INTRAVENOUS | Status: DC | PRN
  Administered 2016-04-15: 70 mg via INTRAVENOUS

## 2016-04-15 MED ORDER — BOOST PO LIQD
237.0000 mL | ORAL | Status: DC
  Filled 2016-04-15: qty 237

## 2016-04-15 MED ORDER — SODIUM CHLORIDE 0.45 % IV SOLN
INTRAVENOUS | Status: DC
  Administered 2016-04-15: 15:00:00 via INTRAVENOUS

## 2016-04-15 SURGICAL SUPPLY — 19 items
BAG URINE DRAINAGE (UROLOGICAL SUPPLIES) ×2 IMPLANT
BAG URO CATCHER STRL LF (MISCELLANEOUS) ×3 IMPLANT
CATH FOLEY 3WAY 30CC 22FR (CATHETERS) ×2 IMPLANT
ELECT REM PT RETURN 9FT ADLT (ELECTROSURGICAL) ×3
ELECTRODE REM PT RTRN 9FT ADLT (ELECTROSURGICAL) ×1 IMPLANT
EVACUATOR MICROVAS BLADDER (UROLOGICAL SUPPLIES) IMPLANT
GLOVE BIOGEL M 8.0 STRL (GLOVE) ×3 IMPLANT
GOWN STRL REUS W/ TWL XL LVL3 (GOWN DISPOSABLE) ×1 IMPLANT
GOWN STRL REUS W/TWL XL LVL3 (GOWN DISPOSABLE) ×6 IMPLANT
LOOP CUT BIPOLAR 24F LRG (ELECTROSURGICAL) ×2 IMPLANT
MANIFOLD NEPTUNE II (INSTRUMENTS) ×3 IMPLANT
NDL SAFETY ECLIPSE 18X1.5 (NEEDLE) ×1 IMPLANT
NEEDLE HYPO 18GX1.5 SHARP (NEEDLE) ×3
PACK CYSTO (CUSTOM PROCEDURE TRAY) ×3 IMPLANT
SET ASPIRATION TUBING (TUBING) ×2 IMPLANT
SYRINGE IRR TOOMEY STRL 70CC (SYRINGE) ×2 IMPLANT
TUBING CONNECTING 10 (TUBING) ×2 IMPLANT
TUBING CONNECTING 10' (TUBING) ×1
WATER STERILE IRR 3000ML UROMA (IV SOLUTION) ×21 IMPLANT

## 2016-04-15 NOTE — Progress Notes (Signed)
Hgb. And Hct. Drawn by lab. 

## 2016-04-15 NOTE — Anesthesia Postprocedure Evaluation (Signed)
Anesthesia Post Note  Patient: Victoria [name]  Procedure(s) Performed: Procedure(s) (LRB): TRANSURETHRAL RESECTION OF BLADDER TUMOR (N/A)  Patient location during evaluation: PACU Anesthesia Type: General Level of consciousness: awake and alert, oriented and patient cooperative Pain management: pain level controlled Vital Signs Assessment: post-procedure vital signs reviewed and stable Respiratory status: spontaneous breathing, nonlabored ventilation and respiratory function stable Cardiovascular status: blood pressure returned to baseline and stable Postop Assessment: no signs of nausea or vomiting Anesthetic complications: no    Last Vitals:  Vitals:   04/15/16 1330 04/15/16 1405  BP: 114/69 118/69  Pulse: 93 91  Resp: 20 16  Temp: 37.1 C 36.8 C    Last Pain:  Vitals:   04/15/16 1432  TempSrc:   PainSc: 0-No pain                 Dezeray Puccio,E. Burnis Kaser

## 2016-04-15 NOTE — Transfer of Care (Signed)
Immediate Anesthesia Transfer of Care Note  Patient: Victoria [name]  Procedure(s) Performed: Procedure(s): TRANSURETHRAL RESECTION OF BLADDER TUMOR (N/A)  Patient Location: PACU  Anesthesia Type:General  Level of Consciousness:  sedated, patient cooperative and responds to stimulation  Airway & Oxygen Therapy:Patient Spontanous Breathing and Patient connected to face mask oxgen  Post-op Assessment:  Report given to PACU RN and Post -op Vital signs reviewed and stable  Post vital signs:  Reviewed and stable  Last Vitals:  Vitals:   04/15/16 0720  BP: 121/65  Pulse: 88  Resp: 16  Temp: 123XX123 C    Complications: No apparent anesthesia complications

## 2016-04-15 NOTE — Anesthesia Preprocedure Evaluation (Addendum)
Anesthesia Evaluation  Patient identified by MRN, date of birth, ID band Patient awake    Reviewed: Allergy & Precautions, NPO status , Patient's Chart, lab work & pertinent test results  History of Anesthesia Complications Negative for: history of anesthetic complications  Airway Mallampati: II  TM Distance: >3 FB Neck ROM: Full    Dental  (+) Dental Advisory Given, Caps   Pulmonary neg pulmonary ROS,    breath sounds clear to auscultation       Cardiovascular hypertension, Pt. on medications (-) angina+ dysrhythmias Atrial Fibrillation + pacemaker  Rhythm:Regular Rate:Normal  '16 ECHO: EF 60-65%, mild MR, mod TR   Neuro/Psych negative neurological ROS     GI/Hepatic negative GI ROS, Neg liver ROS, S/p colostomy    Endo/Other  negative endocrine ROS  Renal/GU negative Renal ROS   Bladder cancer    Musculoskeletal  (+) Arthritis ,   Abdominal   Peds  Hematology negative hematology ROS (+)   Anesthesia Other Findings Ovarian cancer  Reproductive/Obstetrics                            Anesthesia Physical Anesthesia Plan  ASA: III  Anesthesia Plan: General   Post-op Pain Management:    Induction: Intravenous  Airway Management Planned: LMA  Additional Equipment:   Intra-op Plan:   Post-operative Plan:   Informed Consent: I have reviewed the patients History and Physical, chart, labs and discussed the procedure including the risks, benefits and alternatives for the proposed anesthesia with the patient or authorized representative who has indicated his/her understanding and acceptance.   Dental advisory given  Plan Discussed with: CRNA and Surgeon  Anesthesia Plan Comments: (Plan routine monitors, GA- LMA OK)        Anesthesia Quick Evaluation

## 2016-04-15 NOTE — Anesthesia Procedure Notes (Addendum)
Procedure Name: LMA Insertion Date/Time: 04/15/2016 10:09 AM Performed by: West Pugh Pre-anesthesia Checklist: Patient identified, Emergency Drugs available, Suction available, Patient being monitored and Timeout performed Patient Re-evaluated:Patient Re-evaluated prior to inductionOxygen Delivery Method: Circle system utilized Preoxygenation: Pre-oxygenation with 100% oxygen Intubation Type: IV induction Ventilation: Mask ventilation without difficulty LMA: LMA inserted LMA Size: 4.0 Number of attempts: 1 Placement Confirmation: positive ETCO2,  CO2 detector and breath sounds checked- equal and bilateral Tube secured with: Tape Dental Injury: Teeth and Oropharynx as per pre-operative assessment

## 2016-04-15 NOTE — Op Note (Signed)
Preoperative diagnosis: GYN primary.  With direct extension to bladder with hematuria.  Postoperative diagnosis: Same  Principal procedure: Cystoscopy, TURB of a very large (over 5 centimeter) bladder tumor  Surgeon: Tyronn Golda  Anesthesia: Gen. with LMA.  Complications: None  Specimen: Latter tumor fragments, to pathology  Estimated blood loss: Less than 100 mL  Drains: 22 French three-way Foley catheter to CBI.  Indications: 82 year old female with a GYN primary malignancy, status post chemotherapy, radiation, with direct extension to her bladder.  This is basically the patient's only presenting issue, with recurrent hematuria.  She has had 4 prior resections of this for palliation/control of bleeding.  The patient recently presented for recurrent bleeding, she has a large, greater than 5 centimeter fungating mass at the dome/right lateral side of the bladder.  This is an obvious recurrence.  Because the patient's bleeding, she desires to have control of this bleeding and partial resection of this mass.  The patient is aware of the procedure as well as risks, complications, having had this procedure, 4 times before.  Description of procedure: The patient was identified in the holding area.  She received preoperative IV Ancef.  She was taken the operating room where general anesthetic was administered with the LMA.  She is placed in the dorsolithotomy position, genitalia and perineum were prepped and draped.  Proper timeout was performed.  A 23 French cystoscope was advanced into her bladder.  This was inspected circumferentially.  Ureteral orifices were normal in configuration and location.  There was a large mass located on the left dome of her bladder, extending slightly inferior, approximate 2 centimeters from the bladder neck.  This was papillary in some regions, especially around the periphery.  The tumor.  It was also solid with some fibrinous appearing tissue in the mid aspect.  It was  well in the lumen of the bladder.  Following cystoscopy, the resectoscope sheath was passed with the obturator.  The resectoscope instrument with the cutting loop was then placed.  I started resection at the left side of the tumor, at the dome of the bladder, creating a margin of resection that was circumferential on the left side.  I then resected down to the right side, anterior and inferior.  As I previously dictated, the periphery.  The tumor, especially in the left margin, was quite papillary in nature.  Resection was carried towards the periphery of the bladder.  As resection continued, it was evident that there was a large volume of tumor that seemed to continue to fill the lumen of the bladder as her I resected down to the base of the tumor.  Once the left side of the tumor was resected, the resection continued towards the anterior left side of the tumor, again resecting the periphery.  The tumor.  Once the papillary tissue was all resected circumferentially, I then continued resection in the solid tissue in the mid aspect of the bladder tumor.  This was resected until I felt like most of the mass.  The fill the lumen.  The bladder was resected down to the level of the bladder wall, or at least what was once the bladder wall.  A large volume of tumor chips were constantly irrigated from the bladder with the [place] syringe.  Following what I thought was resection of the majority of the tumor, aching sure not to resect too deep to perforate the tumor, I then cauterized the small bleeders that were evident along the periphery of the tumor and in  the mid aspect of the tumor.  This was performed until there was adequate hemostasis and the irrigant was fairly clear.  At this point, all tumor fragments were irrigated from the bladder once again with the [facility] syringe.  They were sent for permanent pathology labeled "bladder tumor, GYN primary".   A 22 French Foley catheter, three-way, was placed and the balloon  filled with 30 mL of water.  It was hooked to CBI.  The patient was then awakened and she was taken to PACU in stable condition.  She tolerated the procedure well.

## 2016-04-15 NOTE — Progress Notes (Signed)
Lab results noted- Hgb. 10.1- Hct. 30.1

## 2016-04-16 MED ORDER — INFLUENZA VAC SPLIT QUAD 0.5 ML IM SUSY: 0.5000 mL | PREFILLED_SYRINGE | INTRAMUSCULAR | Status: DC

## 2016-04-16 MED ORDER — SULFAMETHOXAZOLE-TRIMETHOPRIM 800-160 MG PO TABS: 1.0000 | ORAL_TABLET | Freq: Two times a day (BID) | ORAL | 0 refills | Status: DC

## 2016-04-16 MED ORDER — INFLUENZA VAC SPLIT QUAD 0.5 ML IM SUSY: 0.5000 mL | PREFILLED_SYRINGE | INTRAMUSCULAR | Status: DC | PRN

## 2016-04-16 NOTE — Progress Notes (Signed)
Went over all discharge paperwork with patient.  Discharge summary given.  Prescriptions called in to pharmacy.  All questions answered.  PT will be discharge via wheelchair.

## 2016-04-16 NOTE — Discharge Summary (Signed)
Patient ID: Victoria [name] MRN: [id number] DOB/AGE: 1926-08-24 82 y.o.  Admit date: 04/15/2016 Discharge date: 04/16/2016  Primary Care Physician:  Donnajean Lopes, MD  Discharge Diagnoses:   Present on Admission: . 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  Brief H and P: For complete details please refer to admission H and P, but in brief Victoria [name] os admitted for palliative resection of a bladder mass contiguously involved by a GYN primary that has been bleeding.  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 HEENT: anicteric sclera, pupils reactive to light and accommodation CVS: S1-S2 clear no murmur rubs or gallops Chest: clear to auscultation bilaterally, no wheezing rales or rhonchi Abdomen: soft nontender, nondistended, normal bowel sounds, no organomegaly Extremities: no cyanosis, clubbing or edema noted bilaterally Neuro: Cranial nerves II-XII intact, no focal neurological deficits  Disposition:  Home  Diet:  No restrictions  Activity:  Gradually increase   Disposition and Follow-up:    Pt will go home--followup in 1 month  TESTS THAT NEED FOLLOW-UP  Pathology review  DISCHARGE FOLLOW-UP   Time spent on Discharge:  10 mins  Signed: Jorja Loa 04/16/2016, 6:08 AM

## 2016-04-18 ENCOUNTER — Telehealth: Payer: Self-pay | Admitting: Oncology

## 2016-04-18 NOTE — Telephone Encounter (Signed)
Per 9/19 los f/u PRN

## 2016-04-20 ENCOUNTER — Encounter: Payer: Self-pay | Admitting: Cardiovascular Disease

## 2016-04-20 ENCOUNTER — Ambulatory Visit (INDEPENDENT_AMBULATORY_CARE_PROVIDER_SITE_OTHER): Payer: Medicare Other | Admitting: Cardiovascular Disease

## 2016-04-20 VITALS — BP 100/62 | HR 86 | Ht 60.0 in | Wt 124.0 lb

## 2016-04-20 DIAGNOSIS — I4892 Unspecified atrial flutter: Secondary | ICD-10-CM | POA: Diagnosis not present

## 2016-04-20 DIAGNOSIS — Z950 Presence of cardiac pacemaker: Secondary | ICD-10-CM

## 2016-04-20 DIAGNOSIS — I495 Sick sinus syndrome: Secondary | ICD-10-CM | POA: Diagnosis not present

## 2016-04-20 LAB — CUP PACEART INCLINIC DEVICE CHECK
Battery Remaining Longevity: 18 mo
Date Time Interrogation Session: 20171004163219
Implantable Lead Implant Date: 20100216
Implantable Lead Implant Date: 20100216
Implantable Lead Location: 753859
Lead Channel Impedance Value: 274 Ohm
Lead Channel Pacing Threshold Amplitude: 1.25 V
Lead Channel Pacing Threshold Pulse Width: 0.4 ms
Lead Channel Sensing Intrinsic Amplitude: 2.9 mV
Lead Channel Setting Sensing Sensitivity: 1.5 mV
MDC IDC LEAD LOCATION: 753860
MDC IDC MSMT BATTERY VOLTAGE: 2.78 V
MDC IDC MSMT LEADCHNL RA IMPEDANCE VALUE: 355 Ohm
MDC IDC MSMT LEADCHNL RA SENSING INTR AMPL: 0.7 mV
MDC IDC SET LEADCHNL RA PACING AMPLITUDE: 2 V
MDC IDC SET LEADCHNL RV PACING AMPLITUDE: 2.5 V
MDC IDC SET LEADCHNL RV PACING PULSEWIDTH: 0.4 ms
MDC IDC STAT BRADY RA PERCENT PACED: 47 %
MDC IDC STAT BRADY RV PERCENT PACED: 4.2 %
Pulse Gen Model: 5826
Pulse Gen Serial Number: 2195755

## 2016-04-20 NOTE — Progress Notes (Signed)
Patient ID: Victoria [name], female   DOB: 18-Aug-1926, 82 y.o.   MRN: [id number]    Cardiology Office Note    Date:  04/20/2016   ID:  Victoria, [name] 1926-09-22, MRN [id number]  PCP:  Donnajean Lopes, MD  Cardiologist:   Sanda Klein, MD   Chief Complaint  Patient presents with  . Follow-up    pt stated she's doing great, pt denied chest pain and SOB    History of Present Illness:  Victoria [name] is a 82 y.o. female with recurrent problems with rapid atrial arrhythmia, often exacerbated by small bowel obstruction leading to inability to take oral medications. 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   Despite high embolic risk, she is not on anticoagulants due to recurrent hematuria. She had gross hematuria in August and had resection of a mass on the posterior dome of the bladder via cystoscopic approach by Dr. Diona Fanti on Thursday. Other than that procedure, she has not required repeat hospitalization since March when she had to have lysis of peritoneal adhesions for SBO.  She generally feels well. She tolerates all her medical problems with a tremendous amount of grace. She denies palpitations, dyspnea, angina, abdominal pain, active bleeding problems, orthopnea or PND. She has developed a little bit of swelling around her ankles and noticed that her Boost supplements had more sodium than she expected. She does admit to a lot of emotional stress due to husband's worsening hearing and worsening memory.  Today she presents with atrial flutter with 4:1 atrioventricular block. Interrogation of her pacemaker shows normal function. Her [name] device was implanted in 2010 and has an estimated longevity of another 1.5-5 years. The overall burden of atrial mode switch since her last device check is 26%, but she has about 80% in atrial arrhythmoia from July to today. There is 47%  atrial pacing and only 4.2% ventricular pacing. When not in atrial arrhythmia she has virtually 100% atrial paced rhythm. Ventricular rate control seems to have been adequate.   She had another polarity switch, probably related to the use of electrocautery. Otherwise all ventricular and atrial lead parameters are similar to past readings.    Past Medical History:  Diagnosis Date  . Bladder cancer [facility]) recurrent bladder tumor    secondarty to ovarian cancer--  palliative radiation 09-13-2013 to 10-22-2013  . Cardiac pacemaker in situ 2010   [place]-- PLACEMENT 09-10-2008  . Colostomy in place [facility])   . Diverticulosis   . Fluid collection (edema) in the arms, legs, hands and feet   . Gross hematuria    unable to anticoagulate  . Hyperlipidemia   . Hypertension   . Iron deficiency anemia   . Malignant neoplasm of ovary ([place]) ONOCOLOGIST--  DR Benay Spice  03-13-2012  w/ rectal mass   dx  primay ovarian cancer , +cytokeratin 7, estrogen receptor and WT-1--  chemotherapy and s/p omentectomy and bilateral oophorectomy03-05-2013  . Nausea 2015   chronic intermittent nausea after chemo  . Nocturia   . Paroxysmal atrial flutter (HCC)    CARDIOLOGIST--  DR Sterling Ucci  . RBBB   . SSS (sick sinus syndrome) (HCC)    S/P PACEMAKER    Past Surgical History:  Procedure Laterality Date  . ABDOMINAL HYSTERECTOMY  1972  . BLADDER NECK SUSPENSION  1986  . CARDIAC ELECTROPHYSIOLOGY MAPPING AND ABLATION  05-21-2008   dr Caryl Comes   a-flutter  .  CARDIOVASCULAR STRESS TEST  07-17-2008  dr Myiesha Edgar   normal myocardial perfusion w/ attenuation artifact in the anterior region of mycardium;  no ischemia or infarct/scar/  low risk scan/  normal LV function and wall motion , ef  83%  . COLON RESECTION  04/04/2012   Procedure: COLON RESECTION;  Surgeon: Rolm Bookbinder, MD;  Location: [facility];  Service: General;  Laterality: N/A;  laparotomy with small bowel resection for obstruction with colostomy with gastrostomy  tube placement.   . COLOSTOMY REVISION N/A 10/03/2012   Procedure: COLOSTOMY REVISION;  Surgeon: Rolm Bookbinder, MD;  Location: [facility];  Service: General;  Laterality: N/A;  COLOSTOMY REVISION, Sigmoid colectomy  . CYSTOSCOPY N/A 06/27/2014   Procedure: CYSTOSCOPY;  Surgeon: Jorja Loa, MD;  Location: [facility];  Service: Urology;  Laterality: N/A;  . CYSTOSCOPY N/A 11/18/2014   Procedure: CYSTOSCOPY;  Surgeon: Franchot Gallo, MD;  Location: [facility];  Service: Urology;  Laterality: N/A;  . ELECTROPHYSIOLOGY STUDY  09-26-2008  dr Caryl Comes  03-27-2012  dr Srijan Givan   cardiac ,  no ablation in 2010/   2013  overdrive pacing atrial flutter  . LAPAROTOMY Bilateral 10/03/2012   Procedure: EXPLORATORY LAPAROTOMY;  Surgeon: Imagene Gurney A. Alycia Rossetti, MD;  Location: [facility];  Service: Gynecology;  Laterality: Bilateral;  WITH BSO, TUMOR DEBULKING  . LAPAROTOMY N/A 10/09/2015   Procedure: EXPLORATORY LAPAROTOMY LYSIS OF ADHESIONS;  Surgeon: Leighton Ruff, MD;  Location: [facility];  Service: General;  Laterality: N/A;  . PERMANENT PACEMAKER INSERTION  09-10-2008   [employer]  . PORT-A-CATH REMOVAL N/A 02/21/2013   Procedure: REMOVAL PORT-A-CATH;  Surgeon: Rolm Bookbinder, MD;  Location: [facility];  Service: General;  Laterality: N/A;  . PORTACATH PLACEMENT  05/04/2012   Procedure: INSERTION PORT-A-CATH;  Surgeon: Rolm Bookbinder, MD;  Location: [facility];  Service: General;  Laterality: Right;  . Rib removed  1934   Pleurisy and pneumonia  . TRANSTHORACIC ECHOCARDIOGRAM  07-17-2008     ef >55%,  mild to moderate MR and TR,  mild  sclerotic AV without stenosis,    . TRANSURETHRAL RESECTION OF BLADDER TUMOR N/A 06/27/2014   Procedure: TRANSURETHRAL RESECTION OF BLADDER TUMOR (TURBT);  Surgeon: Jorja Loa, MD;  Location: [facility];  Service: Urology;  Laterality: N/A;  . TRANSURETHRAL RESECTION OF BLADDER TUMOR N/A 11/18/2014   Procedure: TRANSURETHRAL RESECTION OF BLADDER TUMOR (TURBT);  Surgeon:  Franchot Gallo, MD;  Location[facility];  Service: Urology;  Laterality: N/A;  . TRANSURETHRAL RESECTION OF BLADDER TUMOR N/A 04/15/2016   Procedure: TRANSURETHRAL RESECTION OF BLADDER TUMOR;  Surgeon: Franchot Gallo, MD;  Location: [facility];  Service: Urology;  Laterality: N/A;  . TRANSURETHRAL RESECTION OF BLADDER TUMOR WITH GYRUS (TURBT-GYRUS) N/A 09/05/2013   Procedure: TRANSURETHRAL RESECTION OF BLADDER TUMOR WITH GYRUS (TURBT-GYRUS);  Surgeon: Fredricka Bonine, MD;  Location[facility];  Service: Urology;  Laterality: N/A;  . TRANSURETHRAL RESECTION OF BLADDER TUMOR WITH GYRUS (TURBT-GYRUS) N/A 09/29/2015   Procedure: TRANSURETHRAL RESECTION OF BLADDER TUMOR WITH GYRUS (TURBT-GYRUS);  Surgeon: Franchot Gallo, MD;  Location: [facility];  Service: Urology;  Laterality: N/A;    Outpatient Medications Prior to Visit  Medication Sig Dispense Refill  . diltiazem (CARDIZEM CD) 360 MG 24 hr capsule take 1 capsule by mouth once daily 90 capsule 3  . lactose free nutrition (BOOST) LIQD Take 237 mLs by mouth 2 (two) times a week.     . furosemide (LASIX) 20 MG tablet Take 1 tablet (20  mg total) by mouth daily as needed. (Patient taking differently: Take 20 mg by mouth 2 (two) times a week. ) 90 tablet 3  . iron polysaccharides (NIFEREX) 150 MG capsule Take 1 capsule (150 mg total) by mouth daily. (Patient taking differently: Take 150 mg by mouth 3 (three) times a week. ) 30 capsule 6  . potassium chloride (K-DUR) 10 MEQ tablet Take 1 tablet (10 mEq total) by mouth daily. (Patient taking differently: Take 10 mEq by mouth 2 (two) times a week. ) 90 tablet 3  . sulfamethoxazole-trimethoprim (BACTRIM DS,SEPTRA DS) 800-160 MG tablet Take 1 tablet by mouth 2 (two) times daily. 6 tablet 0   Facility-Administered Medications Prior to Visit  Medication Dose Route Frequency Provider Last Rate Last Dose  . heparin 6,000 Units in sodium chloride irrigation 0.9 % 500 mL irrigation   Irrigation  Once Rolm Bookbinder, MD      . sodium chloride 0.9 % injection 10 mL  10 mL Intracatheter PRN Ladell Pier, MD   10 mL at 07/18/12 1248     Allergies:   Pindolol; Iodine; Shellfish allergy; Vibramycin [doxycycline calcium]; Doxycycline; Levofloxacin; Multaq [dronedarone]; and Vicodin [hydrocodone-acetaminophen]   Social History   Social History  . Marital status: Married    Spouse name: N/A  . Number of children: 2  . Years of education: N/A   Occupational History  . Retired     Event organiser,     Social History Main Topics  . Smoking status: Never Smoker  . Smokeless tobacco: Never Used  . Alcohol use No  . Drug use: No  . Sexual activity: Not on file   Other Topics Concern  . Not on file   Social History Narrative   Married     Family History:  The patient's family history includes CAD in her mother; Heart disease in her mother; Stroke in her sister.   ROS:   Please see the history of present illness.    ROS All other systems reviewed and are negative.   PHYSICAL EXAM:   VS:  BP 100/62   Pulse 86   Ht 5' (1.524 m)   Wt 56.2 kg (124 lb)   BMI 24.22 kg/m    GEN: Well nourished, well developed, in no acute distress  HEENT: normal  Neck: no JVD, carotid bruits, or masses Cardiac: Widely split second heart sound, RRR; no murmurs, rubs, or gallops,no edema, healthy  subclavian pacemaker site Respiratory:  clear to auscultation bilaterally, normal work of breathing GI: soft, nontender, nondistended, + BS MS: no deformity or atrophy  Skin: warm and dry, no rash Neuro:  Alert and Oriented x 3, Strength and sensation are intact Psych: euthymic mood, full affect  Wt Readings from Last 3 Encounters:  04/20/16 56.2 kg (124 lb)  04/15/16 54.4 kg (120 lb)  04/13/16 54.4 kg (120 lb)      Studies/Labs Reviewed:   EKG:  EKG is ordered today.  There is Atrial flutter with predominantly 4:1 AV block, pre-existing right bundle branch block  Recent  Labs: 10/05/2015: TSH 1.986 10/12/2015: Magnesium 1.5 04/13/2016: ALT 12; BUN 14.4; Creatinine 0.9; Platelets 256; Potassium 4.6; Sodium 134 04/15/2016: Hemoglobin 10.1   Lipid Panel    Component Value Date/Time   CHOL 115 04/10/2012 0520   TRIG 80 04/10/2012 0520   HDL 43 05/15/2008 0620   CHOLHDL 3.3 05/15/2008 0620   VLDL 17 05/15/2008 0620   LDLCALC  05/15/2008 0620    82  Total Cholesterol/HDL:CHD Risk Coronary Heart Disease Risk Table                     Men   Women  1/2 Average Risk   3.4   3.3    Additional studies/ records that were reviewed today include:  Records from recent hospitalization    ASSESSMENT:    1. Atrial flutter with rapid ventricular response (HCC)   2. Cardiac pacemaker      PLAN:  In order of problems listed above:  1. AFlutter: This has been well rate controlled on diltiazem alone, after we discontinued the digoxin due to its high risk of side effects. CHADSVasc 4 but unable to take anticoagulation due to recurrent hematuria. She has also had well documented in numerous episodes of atrial fibrillation. Flutter ablation has been considered but would be of limited benefit since she would still have atrial fibrillation and since her life expectancy is primarily limited by her abdominal malignancy. Ectopic/reentrant atrial tachycardia seems to be the arrhythmia that has caused her most grief when she is ill. It is also the hardest to rate control.  2. SSS: When she is not in an atrial arrhythmia, she has essentially 100% atrial pacing. Sensed P waves are small, but this does not appear to be interfering much with device function. 3. PM: It is most likely that her recent ventricular lead polarity switch was related to the use of cautery during her abdominal procedure. Otherwise ventricular lead performance is normal. She is not pacemaker dependent (has normal AV conduction, in fact her excellent AV conduction is a problem during her atrial arrhythmia).  Nochanges in pacemaker settings. Her pacemaker is not amenable to remote monitoring. We'll see her back in the clinic in 6 months.    Medication Adjustments/Labs and Tests Ordered: Current medicines are reviewed at length with the patient today.  Concerns regarding medicines are outlined above.  Medication changes, Labs and Tests ordered today are listed in the Patient Instructions below. Patient Instructions  Dr Sallyanne Kuster recommends that you schedule a follow-up appointment in 6 months with a pacemaker check. You will receive a reminder letter in the mail two months in advance. If you don't receive a letter, please call our office to schedule the follow-up appointment.  If you need a refill on your cardiac medications before your next appointment, please call your pharmacy.      Signed, Sanda Klein, MD  04/20/2016 2:14 PM    [facility] [place], [place], [place]  [id number] Phone: [phone]; Fax: [phone]

## 2016-04-20 NOTE — Patient Instructions (Signed)
Dr Croitoru recommends that you schedule a follow-up appointment in 6 months with a pacemaker check. You will receive a reminder letter in the mail two months in advance. If you don't receive a letter, please call our office to schedule the follow-up appointment.  If you need a refill on your cardiac medications before your next appointment, please call your pharmacy. 

## 2016-07-20 ENCOUNTER — Other Ambulatory Visit: Payer: Self-pay | Admitting: Nurse Practitioner

## 2016-08-31 ENCOUNTER — Other Ambulatory Visit: Payer: Self-pay | Admitting: Cardiovascular Disease

## 2016-09-01 NOTE — Telephone Encounter (Signed)
Rx(s) sent to pharmacy electronically.  

## 2016-09-11 ENCOUNTER — Emergency Department (HOSPITAL_COMMUNITY): Payer: Medicare Other

## 2016-09-11 ENCOUNTER — Encounter (HOSPITAL_COMMUNITY): Payer: Self-pay | Admitting: Emergency Medicine

## 2016-09-11 ENCOUNTER — Inpatient Hospital Stay (HOSPITAL_COMMUNITY)
Admission: EM | Admit: 2016-09-11 | Discharge: 2016-09-15 | DRG: 687 | Disposition: A | Discharge status: Skilled Nursing Facility | Payer: Medicare Other | Attending: Internal Medicine | Admitting: Internal Medicine

## 2016-09-11 ENCOUNTER — Inpatient Hospital Stay (HOSPITAL_COMMUNITY): Payer: Medicare Other

## 2016-09-11 DIAGNOSIS — I4510 Unspecified right bundle-branch block: Secondary | ICD-10-CM

## 2016-09-11 DIAGNOSIS — N839 Noninflammatory disorder of ovary, fallopian tube and broad ligament, unspecified: Secondary | ICD-10-CM | POA: Diagnosis not present

## 2016-09-11 DIAGNOSIS — Z91013 Allergy to seafood: Secondary | ICD-10-CM

## 2016-09-11 DIAGNOSIS — Z90710 Acquired absence of both cervix and uterus: Secondary | ICD-10-CM

## 2016-09-11 DIAGNOSIS — N179 Acute kidney failure, unspecified: Secondary | ICD-10-CM

## 2016-09-11 DIAGNOSIS — I495 Sick sinus syndrome: Secondary | ICD-10-CM | POA: Diagnosis not present

## 2016-09-11 DIAGNOSIS — K56691 Other complete intestinal obstruction: Secondary | ICD-10-CM | POA: Diagnosis present

## 2016-09-11 DIAGNOSIS — E872 Acidosis: Secondary | ICD-10-CM | POA: Diagnosis present

## 2016-09-11 DIAGNOSIS — C785 Secondary malignant neoplasm of large intestine and rectum: Secondary | ICD-10-CM | POA: Diagnosis present

## 2016-09-11 DIAGNOSIS — Z888 Allergy status to other drugs, medicaments and biological substances status: Secondary | ICD-10-CM | POA: Diagnosis not present

## 2016-09-11 DIAGNOSIS — N1330 Unspecified hydronephrosis: Secondary | ICD-10-CM | POA: Diagnosis present

## 2016-09-11 DIAGNOSIS — E860 Dehydration: Secondary | ICD-10-CM

## 2016-09-11 DIAGNOSIS — I480 Paroxysmal atrial fibrillation: Secondary | ICD-10-CM | POA: Diagnosis not present

## 2016-09-11 DIAGNOSIS — Z66 Do not resuscitate: Secondary | ICD-10-CM | POA: Diagnosis not present

## 2016-09-11 DIAGNOSIS — G893 Neoplasm related pain (acute) (chronic): Secondary | ICD-10-CM | POA: Diagnosis not present

## 2016-09-11 DIAGNOSIS — K435 Parastomal hernia without obstruction or  gangrene: Secondary | ICD-10-CM | POA: Diagnosis present

## 2016-09-11 DIAGNOSIS — Z79899 Other long term (current) drug therapy: Secondary | ICD-10-CM

## 2016-09-11 DIAGNOSIS — E46 Unspecified protein-calorie malnutrition: Secondary | ICD-10-CM | POA: Diagnosis present

## 2016-09-11 DIAGNOSIS — Z933 Colostomy status: Secondary | ICD-10-CM

## 2016-09-11 DIAGNOSIS — Z515 Encounter for palliative care: Secondary | ICD-10-CM

## 2016-09-11 DIAGNOSIS — E785 Hyperlipidemia, unspecified: Secondary | ICD-10-CM | POA: Diagnosis present

## 2016-09-11 DIAGNOSIS — L8990 Pressure ulcer of unspecified site, unspecified stage: Secondary | ICD-10-CM | POA: Insufficient documentation

## 2016-09-11 DIAGNOSIS — K56609 Unspecified intestinal obstruction, unspecified as to partial versus complete obstruction: Secondary | ICD-10-CM | POA: Diagnosis not present

## 2016-09-11 DIAGNOSIS — Z9221 Personal history of antineoplastic chemotherapy: Secondary | ICD-10-CM | POA: Diagnosis not present

## 2016-09-11 DIAGNOSIS — Z881 Allergy status to other antibiotic agents status: Secondary | ICD-10-CM | POA: Diagnosis not present

## 2016-09-11 DIAGNOSIS — C7911 Secondary malignant neoplasm of bladder: Secondary | ICD-10-CM | POA: Diagnosis not present

## 2016-09-11 DIAGNOSIS — C569 Malignant neoplasm of unspecified ovary: Secondary | ICD-10-CM | POA: Diagnosis not present

## 2016-09-11 DIAGNOSIS — N1339 Other hydronephrosis: Secondary | ICD-10-CM | POA: Diagnosis not present

## 2016-09-11 DIAGNOSIS — R1110 Vomiting, unspecified: Secondary | ICD-10-CM

## 2016-09-11 DIAGNOSIS — R109 Unspecified abdominal pain: Secondary | ICD-10-CM

## 2016-09-11 DIAGNOSIS — Z6821 Body mass index (BMI) 21.0-21.9, adult: Secondary | ICD-10-CM

## 2016-09-11 DIAGNOSIS — Z950 Presence of cardiac pacemaker: Secondary | ICD-10-CM | POA: Diagnosis not present

## 2016-09-11 DIAGNOSIS — Z90722 Acquired absence of ovaries, bilateral: Secondary | ICD-10-CM

## 2016-09-11 DIAGNOSIS — R64 Cachexia: Secondary | ICD-10-CM | POA: Diagnosis present

## 2016-09-11 DIAGNOSIS — Z8249 Family history of ischemic heart disease and other diseases of the circulatory system: Secondary | ICD-10-CM

## 2016-09-11 DIAGNOSIS — E871 Hypo-osmolality and hyponatremia: Secondary | ICD-10-CM | POA: Diagnosis not present

## 2016-09-11 DIAGNOSIS — Z0189 Encounter for other specified special examinations: Secondary | ICD-10-CM

## 2016-09-11 DIAGNOSIS — Z8543 Personal history of malignant neoplasm of ovary: Secondary | ICD-10-CM

## 2016-09-11 DIAGNOSIS — I119 Hypertensive heart disease without heart failure: Secondary | ICD-10-CM | POA: Diagnosis present

## 2016-09-11 DIAGNOSIS — Z923 Personal history of irradiation: Secondary | ICD-10-CM

## 2016-09-11 DIAGNOSIS — C561 Malignant neoplasm of right ovary: Secondary | ICD-10-CM | POA: Diagnosis not present

## 2016-09-11 DIAGNOSIS — Z885 Allergy status to narcotic agent status: Secondary | ICD-10-CM

## 2016-09-11 DIAGNOSIS — I4891 Unspecified atrial fibrillation: Secondary | ICD-10-CM | POA: Diagnosis not present

## 2016-09-11 DIAGNOSIS — N838 Other noninflammatory disorders of ovary, fallopian tube and broad ligament: Secondary | ICD-10-CM

## 2016-09-11 DIAGNOSIS — N131 Hydronephrosis with ureteral stricture, not elsewhere classified: Secondary | ICD-10-CM | POA: Diagnosis present

## 2016-09-11 DIAGNOSIS — Z8719 Personal history of other diseases of the digestive system: Secondary | ICD-10-CM

## 2016-09-11 HISTORY — DX: Malignant neoplasm of dome of bladder: C67.1

## 2016-09-11 HISTORY — DX: Unspecified atrial flutter: I48.92

## 2016-09-11 HISTORY — DX: Unspecified intestinal obstruction, unspecified as to partial versus complete obstruction: K56.609

## 2016-09-11 LAB — CBC WITH DIFFERENTIAL/PLATELET
BASOS ABS: 0 10*3/uL (ref 0.0–0.1)
BASOS PCT: 0 %
EOS ABS: 0 10*3/uL (ref 0.0–0.7)
EOS PCT: 0 %
HCT: 35.6 % — ABNORMAL LOW (ref 36.0–46.0)
Hemoglobin: 11.7 g/dL — ABNORMAL LOW (ref 12.0–15.0)
LYMPHS PCT: 5 %
Lymphs Abs: 0.8 10*3/uL (ref 0.7–4.0)
MCH: 28.5 pg (ref 26.0–34.0)
MCHC: 32.9 g/dL (ref 30.0–36.0)
MCV: 86.8 fL (ref 78.0–100.0)
Monocytes Absolute: 0.8 10*3/uL (ref 0.1–1.0)
Monocytes Relative: 5 %
Neutro Abs: 14.3 10*3/uL — ABNORMAL HIGH (ref 1.7–7.7)
Neutrophils Relative %: 90 %
PLATELETS: 318 10*3/uL (ref 150–400)
RBC: 4.1 MIL/uL (ref 3.87–5.11)
RDW: 17.2 % — ABNORMAL HIGH (ref 11.5–15.5)
WBC: 15.9 10*3/uL — AB (ref 4.0–10.5)

## 2016-09-11 LAB — URINALYSIS, ROUTINE W REFLEX MICROSCOPIC
Bilirubin Urine: NEGATIVE
GLUCOSE, UA: NEGATIVE mg/dL
Ketones, ur: NEGATIVE mg/dL
NITRITE: POSITIVE — AB
PH: 6 (ref 5.0–8.0)
Protein, ur: 100 mg/dL — AB
Specific Gravity, Urine: 1.012 (ref 1.005–1.030)
Squamous Epithelial / LPF: NONE SEEN

## 2016-09-11 LAB — I-STAT CG4 LACTIC ACID, ED: LACTIC ACID, VENOUS: 3.19 mmol/L — AB (ref 0.5–1.9)

## 2016-09-11 LAB — COMPREHENSIVE METABOLIC PANEL
ALT: 9 U/L — AB (ref 14–54)
AST: 20 U/L (ref 15–41)
Albumin: 2.9 g/dL — ABNORMAL LOW (ref 3.5–5.0)
Alkaline Phosphatase: 82 U/L (ref 38–126)
Anion gap: 8 (ref 5–15)
BUN: 19 mg/dL (ref 6–20)
CHLORIDE: 94 mmol/L — AB (ref 101–111)
CO2: 28 mmol/L (ref 22–32)
CREATININE: 1.74 mg/dL — AB (ref 0.44–1.00)
Calcium: 9.2 mg/dL (ref 8.9–10.3)
GFR calc Af Amer: 29 mL/min — ABNORMAL LOW (ref >60)
GFR calc non Af Amer: 25 mL/min — ABNORMAL LOW (ref >60)
Glucose, Bld: 174 mg/dL — ABNORMAL HIGH (ref 65–99)
Potassium: 4.5 mmol/L (ref 3.5–5.1)
SODIUM: 130 mmol/L — AB (ref 135–145)
Total Bilirubin: 0.9 mg/dL (ref 0.3–1.2)
Total Protein: 6.9 g/dL (ref 6.5–8.1)

## 2016-09-11 LAB — LIPASE, BLOOD: Lipase: 14 U/L (ref 11–51)

## 2016-09-11 LAB — GLUCOSE, CAPILLARY: Glucose-Capillary: 115 mg/dL — ABNORMAL HIGH (ref 65–99)

## 2016-09-11 LAB — LACTIC ACID, PLASMA: LACTIC ACID, VENOUS: 1.8 mmol/L (ref 0.5–1.9)

## 2016-09-11 LAB — MRSA PCR SCREENING: MRSA by PCR: NEGATIVE

## 2016-09-11 MED ORDER — HEPARIN SODIUM (PORCINE) 5000 UNIT/ML IJ SOLN
5000.0000 [IU] | Freq: Three times a day (TID) | INTRAMUSCULAR | Status: DC
  Administered 2016-09-11 – 2016-09-14 (×8): 5000 [IU] via SUBCUTANEOUS
  Filled 2016-09-11 (×9): qty 1

## 2016-09-11 MED ORDER — ONDANSETRON HCL 4 MG/2ML IJ SOLN
4.0000 mg | Freq: Four times a day (QID) | INTRAMUSCULAR | Status: DC | PRN
  Administered 2016-09-12 – 2016-09-15 (×5): 4 mg via INTRAVENOUS
  Filled 2016-09-11 (×5): qty 2

## 2016-09-11 MED ORDER — DILTIAZEM HCL-DEXTROSE 100-5 MG/100ML-% IV SOLN (PREMIX)
5.0000 mg/h | INTRAVENOUS | Status: DC
  Administered 2016-09-11: 15 mg/h via INTRAVENOUS
  Administered 2016-09-11: 5 mg/h via INTRAVENOUS
  Administered 2016-09-12 (×3): 15 mg/h via INTRAVENOUS
  Administered 2016-09-13: 17.5 mg/h via INTRAVENOUS
  Administered 2016-09-13: 15 mg/h via INTRAVENOUS
  Administered 2016-09-13: 17.5 mg/h via INTRAVENOUS
  Administered 2016-09-14 – 2016-09-15 (×5): 15 mg/h via INTRAVENOUS
  Filled 2016-09-11 (×16): qty 100

## 2016-09-11 MED ORDER — CHLORHEXIDINE GLUCONATE 0.12 % MT SOLN
15.0000 mL | Freq: Two times a day (BID) | OROMUCOSAL | Status: DC
  Administered 2016-09-11 – 2016-09-15 (×7): 15 mL via OROMUCOSAL
  Filled 2016-09-11 (×4): qty 15

## 2016-09-11 MED ORDER — LIDOCAINE HCL 2 % EX GEL
1.0000 "application " | Freq: Once | CUTANEOUS | Status: DC
  Filled 2016-09-11: qty 11

## 2016-09-11 MED ORDER — ACETAMINOPHEN 325 MG PO TABS: 650.0000 mg | ORAL_TABLET | Freq: Four times a day (QID) | ORAL | Status: DC | PRN

## 2016-09-11 MED ORDER — ONDANSETRON HCL 4 MG PO TABS: 4.0000 mg | ORAL_TABLET | Freq: Four times a day (QID) | ORAL | Status: DC | PRN

## 2016-09-11 MED ORDER — SODIUM CHLORIDE 0.9 % IV SOLN
INTRAVENOUS | Status: DC
  Administered 2016-09-11: 09:00:00 via INTRAVENOUS

## 2016-09-11 MED ORDER — ONDANSETRON HCL 4 MG/2ML IJ SOLN
4.0000 mg | Freq: Once | INTRAMUSCULAR | Status: AC
  Administered 2016-09-11: 4 mg via INTRAVENOUS
  Filled 2016-09-11: qty 2

## 2016-09-11 MED ORDER — SODIUM CHLORIDE 0.9 % IV BOLUS (SEPSIS)
500.0000 mL | Freq: Once | INTRAVENOUS | Status: AC
  Administered 2016-09-11: 500 mL via INTRAVENOUS

## 2016-09-11 MED ORDER — SODIUM CHLORIDE 0.9% FLUSH
3.0000 mL | Freq: Two times a day (BID) | INTRAVENOUS | Status: DC
  Administered 2016-09-11 – 2016-09-14 (×2): 3 mL via INTRAVENOUS

## 2016-09-11 MED ORDER — IOPAMIDOL (ISOVUE-300) INJECTION 61%: 100.0000 mL | Freq: Once | INTRAVENOUS | Status: DC | PRN

## 2016-09-11 MED ORDER — ORAL CARE MOUTH RINSE
15.0000 mL | Freq: Two times a day (BID) | OROMUCOSAL | Status: DC
  Administered 2016-09-12 – 2016-09-13 (×3): 15 mL via OROMUCOSAL

## 2016-09-11 MED ORDER — ACETAMINOPHEN 650 MG RE SUPP: 650.0000 mg | Freq: Four times a day (QID) | RECTAL | Status: DC | PRN

## 2016-09-11 MED ORDER — DILTIAZEM LOAD VIA INFUSION
10.0000 mg | Freq: Once | INTRAVENOUS | Status: AC
  Administered 2016-09-11: 10 mg via INTRAVENOUS
  Filled 2016-09-11: qty 10

## 2016-09-11 MED ORDER — SODIUM CHLORIDE 0.9 % IV SOLN
INTRAVENOUS | Status: DC
  Administered 2016-09-11 – 2016-09-12 (×2): via INTRAVENOUS
  Administered 2016-09-12: 75 mL/h via INTRAVENOUS
  Administered 2016-09-13 – 2016-09-14 (×3): via INTRAVENOUS

## 2016-09-11 MED ORDER — MORPHINE SULFATE (PF) 4 MG/ML IV SOLN
2.0000 mg | INTRAVENOUS | Status: DC | PRN
  Administered 2016-09-11 – 2016-09-14 (×7): 2 mg via INTRAVENOUS
  Filled 2016-09-11 (×7): qty 1

## 2016-09-11 NOTE — H&P (Signed)
History and Physical  Victoria [name] [id number] DOB: [birth year] DOA: 09/11/2016   PCP: Donnajean Lopes, MD   Patient coming from: Home  Chief Complaint: abdominal pain  HPI:  Victoria [name] is a 83 y.o. 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 In addition, the patient began having 2-3 episodes of emesis beginning on 09/10/2016. CT of the abdomen and pelvis at the time of admission showed right ureteral dilatation that terminates at a large soft tissue mass in the right pelvis. There was also dilated small bowel with decompressed distal small bowel consistent with a small bowel obstruction without clear transition point. The patient has had previous hospital admissions with rapid atrial arrhythmia often exacerbated by small bowel obstruction leading to inability to take her oral medications. The patient has had a history of recurrent small bowel obstructions related to her metastatic ovarian carcinoma with extensive pelvic involvement including invasion of her urinary bladder. The patient is not on anticoagulation secondary to her history of recurrent hematuria.  In the emergency department, the patient was afebrile and hemodynamically stable, but was found to be in atrial fibrillation with RVR with heart rate 170s.  The patient was placed on diltiazem drip and spontaneously converted back to sinus rhythm. She is saturating well on room air. She had an episode of emesis in the ED. The patient was given 1 L normal saline. General surgery and urology has been consulted by EDP. Sodium was 130 with serum creatinine 1.74. Obesity was 15.9. Lactic acid was 3.19.  Regarding her malignancy, the patient was initially diagnosed with ovarian cancer presenting as a  rectal mass found on sigmoidoscopy 03/13/2012. The patient subsequently underwent a laparotomy with jejunum to jejunum bypass and loop colostomy on 04/04/2012. She subsequently underwent a sigmoidectomy and revision of her end ostomy on 10/03/2012. On 09/02/2013, the patient underwent TURBT and fulguration when it was found that her ovarian cancer had invaded her urinary bladder. More recently, the patient had expiratory laparotomy with lysis of adhesions on 10/09/2015 secondary to small obstruction. The patient states that she had a recent cystoscopy performed by Dr. Zannie Cove one week prior to this admission at which time there is approximately 10% invasion of the bladder by her ovarian tumor, but decision was made to observe clinically.  Assessment/Plan: Small bowel obstruction -Gen. surgery consulted -npo -IVF  Acute kidney injury/right hydronephrosis -Secondary to obstructive uropathy and volume depletion -Urology has been consulted--no acute intervention needed at this time -IV fluids -09/11/2016 CT abdomen shows right ureteral dilatation caused by soft tissue mass compression -am BMP  Atrial fibrillation with RVR -Spontaneously converted to sinus rhythm with diltiazem drip -Continue diltiazem drip for now as patient is unable to tolerate po -Not an anticoagulation candidate secondary to recurrent hematuria -CHADs VASc=4 -follows Dr. Sallyanne Kuster -s/p PPM for SSS  Hyponatremia -Secondary to volume depletion -IV fluids  Leukocytosis -Likely stress demargination -UA and urine culture -Blood cultures 2 sets  Metastatic ovarian cancer with bladder invasion -Last chemotherapy over 3 years ago -follows Dr. Benay Spice and Dr. Diona Fanti  Hypertension -Discontinue oral diltiazem until able to tolerate po -Continue IV diltiazem  Lactic acidosis -Likely secondary to volume depletion -Patient does not appear septic clinically -IVF       Past Medical History:  Diagnosis Date    . Bladder cancer (  [place]) recurrent bladder tumor    secondarty to ovarian cancer--  palliative radiation 09-13-2013 to 10-22-2013  . Cardiac pacemaker in situ 2010   [place]-- PLACEMENT 09-10-2008  . Colostomy in place [facility])   . Diverticulosis   . Fluid collection (edema) in the arms, legs, hands and feet   . Gross hematuria    unable to anticoagulate  . Hyperlipidemia   . Hypertension   . Iron deficiency anemia   . Malignant neoplasm of ovary ([place]) ONOCOLOGIST--  DR Benay Spice  03-13-2012  w/ rectal mass   dx  primay ovarian cancer , +cytokeratin 7, estrogen receptor and WT-1--  chemotherapy and s/p omentectomy and bilateral oophorectomy03-05-2013  . Nausea 2015   chronic intermittent nausea after chemo  . Nocturia   . Paroxysmal atrial flutter ([facility])    CARDIOLOGIST--  DR CROITORU  . RBBB   . SSS (sick sinus syndrome) ([facility])    S/P PACEMAKER   Past Surgical History:  Procedure Laterality Date  . ABDOMINAL HYSTERECTOMY  1972  . BLADDER NECK SUSPENSION  1986  . CARDIAC ELECTROPHYSIOLOGY MAPPING AND ABLATION  05-21-2008   dr Caryl Comes   a-flutter  . CARDIOVASCULAR STRESS TEST  07-17-2008  dr croitoru   normal myocardial perfusion w/ attenuation artifact in the anterior region of mycardium;  no ischemia or infarct/scar/  low risk scan/  normal LV function and wall motion , ef  83%  . COLON RESECTION  04/04/2012   Procedure: COLON RESECTION;  Surgeon: Rolm Bookbinder, MD;  Location: [facility];  Service: General;  Laterality: N/A;  laparotomy with small bowel resection for obstruction with colostomy with gastrostomy tube placement.   . COLOSTOMY REVISION N/A 10/03/2012   Procedure: COLOSTOMY REVISION;  Surgeon: Rolm Bookbinder, MD;  Location: [facility];  Service: General;  Laterality: N/A;  COLOSTOMY REVISION, Sigmoid colectomy  . CYSTOSCOPY N/A 06/27/2014   Procedure: CYSTOSCOPY;  Surgeon: Jorja Loa, MD;  Location: [facility];  Service: Urology;  Laterality: N/A;  . CYSTOSCOPY N/A  11/18/2014   Procedure: CYSTOSCOPY;  Surgeon: Franchot Gallo, MD;  Location: [facility];  Service: Urology;  Laterality: N/A;  . ELECTROPHYSIOLOGY STUDY  09-26-2008  dr Caryl Comes  03-27-2012  dr croitoru   cardiac ,  no ablation in 2010/   2013  overdrive pacing atrial flutter  . LAPAROTOMY Bilateral 10/03/2012   Procedure: EXPLORATORY LAPAROTOMY;  Surgeon: Imagene Gurney A. Alycia Rossetti, MD;  Location: [facility];  Service: Gynecology;  Laterality: Bilateral;  WITH BSO, TUMOR DEBULKING  . LAPAROTOMY N/A 10/09/2015   Procedure: EXPLORATORY LAPAROTOMY LYSIS OF ADHESIONS;  Surgeon: Leighton Ruff, MD;  Location: [facility];  Service: General;  Laterality: N/A;  . PERMANENT PACEMAKER INSERTION  09-10-2008   [place]  . PORT-A-CATH REMOVAL N/A 02/21/2013   Procedure: REMOVAL PORT-A-CATH;  Surgeon: Rolm Bookbinder, MD;  Location: [facility];  Service: General;  Laterality: N/A;  . PORTACATH PLACEMENT  05/04/2012   Procedure: INSERTION PORT-A-CATH;  Surgeon: Rolm Bookbinder, MD;  Location: [facility];  Service: General;  Laterality: Right;  . Rib removed  1934   Pleurisy and pneumonia  . TRANSTHORACIC ECHOCARDIOGRAM  07-17-2008     ef >55%,  mild to moderate MR and TR,  mild  sclerotic AV without stenosis,    . TRANSURETHRAL RESECTION OF BLADDER TUMOR N/A 06/27/2014   Procedure: TRANSURETHRAL RESECTION OF BLADDER TUMOR (TURBT);  Surgeon: Jorja Loa, MD;  Location: [facility];  Service: Urology;  Laterality: N/A;  . TRANSURETHRAL RESECTION OF BLADDER TUMOR N/A  11/18/2014   Procedure: TRANSURETHRAL RESECTION OF BLADDER TUMOR (TURBT);  Surgeon: Franchot Gallo, MD;  [facility];  Service: Urology;  Laterality: N/A;  . TRANSURETHRAL RESECTION OF BLADDER TUMOR N/A 04/15/2016   Procedure: TRANSURETHRAL RESECTION OF BLADDER TUMOR;  Surgeon: Franchot Gallo, MD;  Location: [facility];  Service: Urology;  Laterality: N/A;  . TRANSURETHRAL RESECTION OF BLADDER TUMOR WITH GYRUS (TURBT-GYRUS) N/A  09/05/2013   Procedure: TRANSURETHRAL RESECTION OF BLADDER TUMOR WITH GYRUS (TURBT-GYRUS);  Surgeon: Fredricka Bonine, MD;  Location: [facility];  Service: Urology;  Laterality: N/A;  . TRANSURETHRAL RESECTION OF BLADDER TUMOR WITH GYRUS (TURBT-GYRUS) N/A 09/29/2015   Procedure: TRANSURETHRAL RESECTION OF BLADDER TUMOR WITH GYRUS (TURBT-GYRUS);  Surgeon: Franchot Gallo, MD;  Location: [facility];  Service: Urology;  Laterality: N/A;   Social History:  reports that she has never smoked. She has never used smokeless tobacco. She reports that she does not drink alcohol or use drugs.   Family History  Problem Relation Age of Onset  . Heart disease Mother   . CAD Mother   . Stroke Sister   . Cancer Neg Hx   . Diabetes Neg Hx      Allergies  Allergen Reactions  . Pindolol Nausea Only and Swelling    Throat swells  . Iodine Other (See Comments)     TOPICAL IODINE///BLISTERS  . Shellfish Allergy Diarrhea and Nausea And Vomiting  . Vibramycin [Doxycycline Calcium] Nausea Only and Other (See Comments)    dizziness  . Doxycycline Nausea Only  . Levofloxacin Other (See Comments)    DIZZINESS  . Multaq [Dronedarone] Other (See Comments)    CAUSED SEVERE BURNING GI TRACT  . Vicodin [Hydrocodone-Acetaminophen] Other (See Comments)    dizziness     Prior to Admission medications   Medication Sig Start Date End Date Taking? Authorizing Provider  diltiazem (CARDIZEM CD) 360 MG 24 hr capsule take 1 capsule by mouth once daily 10/21/15  Yes Mihai Croitoru, MD  furosemide (LASIX) 20 MG tablet take 1 tablet by mouth once daily Patient taking differently: take 1 tablet by mouth once a week on Sunday 09/01/16  Yes Mihai Croitoru, MD  iron polysaccharides (NIFEREX) 150 MG capsule Take 150 mg by mouth 3 (three) times a week.   Yes Historical Provider, MD  nystatin-triamcinolone ointment [facility]) apply to affected area twice a day if needed for itching 08/31/16  Yes Historical Provider, MD  potassium  chloride (K-DUR) 10 MEQ tablet take 1 tablet by mouth once daily Patient taking differently: take 1 tablet by mouth once a week on Sunday 09/01/16  Yes Mihai Croitoru, MD    Review of Systems:  Constitutional:  No weight loss, night sweats, Fevers, chills, fatigue.  Head&Eyes: No headache.  No vision loss.  No eye pain or scotoma ENT:  No Difficulty swallowing,Tooth/dental problems,Sore throat,  No ear ache, post nasal drip,  Cardio-vascular:  No chest pain, Orthopnea, PND, swelling in lower extremities,  dizziness, palpitations  GI:  No diarrhea, loss of appetite, hematochezia, melena, heartburn, indigestion, Resp:  No shortness of breath with exertion or at rest. No cough. No coughing up of blood .No wheezing.No chest wall deformity  Skin:  no rash or lesions.  GU:  no dysuria, change in color of urine, no urgency or frequency. No flank pain.  Musculoskeletal:  No joint pain or swelling. No decreased range of motion. No back pain.  Psych:  No change in mood or affect. No depression or anxiety. Neurologic:  No headache, no dysesthesia, no focal weakness, no vision loss. No syncope  Physical Exam: Vitals:   09/11/16 0930 09/11/16 1003 09/11/16 1032 09/11/16 1101  BP: 116/80 118/67 109/78 118/71  Pulse: (!) 121 110 110 105  Resp: 20 19 20 20   Temp:      TempSrc:      SpO2: 98% 100% 95% 98%   General:  A&O x 3, NAD, nontoxic, pleasant/cooperative Head/Eye: No conjunctival hemorrhage, no icterus, [place]/AT, No nystagmus ENT:  No icterus,  No thrush, good dentition, no pharyngeal exudate Neck:  No masses, no lymphadenpathy, no bruits CV:  RRR, no rub, no gallop, no S3 Lung:  CTAB, good air movement, no wheeze, no rhonchi Abdomen: soft/NT, +BS, nondistended, no peritoneal signs Ext: No cyanosis, No rashes, No petechiae, No lymphangitis, No edema Neuro: CNII-XII intact, strength 4/5 in bilateral upper and lower extremities, no dysmetria  Labs on Admission:  Basic Metabolic  Panel:  Recent Labs Lab 09/11/16 0827  NA 130*  K 4.5  CL 94*  CO2 28  GLUCOSE 174*  BUN 19  CREATININE 1.74*  CALCIUM 9.2   Liver Function Tests:  Recent Labs Lab 09/11/16 0827  AST 20  ALT 9*  ALKPHOS 82  BILITOT 0.9  PROT 6.9  ALBUMIN 2.9*    Recent Labs Lab 09/11/16 0827  LIPASE 14   No results for input(s): AMMONIA in the last 168 hours. CBC:  Recent Labs Lab 09/11/16 0827  WBC 15.9*  NEUTROABS 14.3*  HGB 11.7*  HCT 35.6*  MCV 86.8  PLT 318   Coagulation Profile: No results for input(s): INR, PROTIME in the last 168 hours. Cardiac Enzymes: No results for input(s): CKTOTAL, CKMB, CKMBINDEX, TROPONINI in the last 168 hours. BNP: Invalid input(s): POCBNP CBG: No results for input(s): GLUCAP in the last 168 hours. Urine analysis:    Component Value Date/Time   COLORURINE AMBER (A) 10/04/2015 1314   APPEARANCEUR TURBID (A) 10/04/2015 1314   LABSPEC 1.020 11/21/2015 1006   PHURINE 6.0 11/21/2015 1006   PHURINE 6.0 10/04/2015 1314   GLUCOSEU Negative 11/21/2015 1006   HGBUR Large 11/21/2015 1006   HGBUR LARGE (A) 10/04/2015 1314   BILIRUBINUR Negative 11/21/2015 1006   KETONESUR Negative 11/21/2015 1006   KETONESUR NEGATIVE 10/04/2015 1314   PROTEINUR 300 11/21/2015 1006   PROTEINUR >300 (A) 10/04/2015 1314   UROBILINOGEN 0.2 11/21/2015 1006   NITRITE Negative 11/21/2015 1006   NITRITE NEGATIVE 10/04/2015 1314   LEUKOCYTESUR Moderate 11/21/2015 1006   Sepsis Labs: @LABRCNTIP (procalcitonin:4,lacticidven:4) )No results found for this or any previous visit (from the past 240 hour(s)).   Radiological Exams on Admission: Ct Abdomen Pelvis Wo Contrast  Result Date: 09/11/2016 CLINICAL DATA:  Abdominal pain, nausea, and vomiting, decreased ostomy output since Thursday, suspected bowel obstruction, history of ovarian cancer 2013, bladder cancer 2015, paroxysmal atrial flutter EXAM: CT ABDOMEN AND PELVIS WITHOUT CONTRAST TECHNIQUE: Multidetector  CT imaging of the abdomen and pelvis was performed following the standard protocol without IV contrast. Sagittal and coronal MPR images reconstructed from axial data set. No oral contrast was administered. COMPARISON:  10/04/2015 FINDINGS: Lower chest: Bibasilar atelectasis greater on RIGHT Hepatobiliary: Gallbladder unremarkable. No definite focal liver lesion. Pancreas: Atrophic without mass Spleen: Normal appearance Adrenals/Urinary Tract: Adrenal glands normal appearance. Tiny nonobstructing calculus LEFT kidney. LEFT kidney in ureter otherwise normal appearance. Marked RIGHT hydronephrosis and hydroureter since previous exam. Tiny nonobstructing calculus within the RIGHT renal pelvis. RIGHT ureteral dilatation terminates at a large soft tissue mass in  the RIGHT pelvis compatible with neoplasm, likely representing significant interval in growth with of the partially necrotic tumor mass identified in the RIGHT pelvis on the previous exam now measuring 11.5 x 9.6 x 9.5 cm, likely arising from the RIGHT lateral aspect of the bladder. Air is seen within urinary bladder. Additional foci of air seen within the mass likely representing necrosis/cavitation. The mass is contiguous with multiple pelvic bowel loops. Stomach/Bowel: Stomach partially distended. Dilated proximal small bowel with decompressed distal small bowel compatible small bowel obstruction. Point of transition is not clearly delineated but appears to be in the pelvis question related to adhesion or observed RIGHT pelvic tumor. Colon appears decompressed. Scattered colonic diverticulosis. LEFT lower quadrant colostomy. Parastomal herniation of a segment of transverse colon without colonic obstruction/dilatation. Vascular/Lymphatic: Atherosclerotic calcifications without aneurysm. Pacemaker leads RIGHT atrium and RIGHT ventricle. No definite adenopathy. Reproductive: Uterus surgically absent by history. Ovaries not visualized. Other: Scattered free fluid.   No definite free air. Musculoskeletal: Osseous demineralization with mild scoliosis and diffuse degenerative disc disease changes of the lumbar spine IMPRESSION: Large Ashley necrotic tumor mass in RIGHT pelvis 11.5 x 9.6 x 9.5 cm, causing obstruction of the RIGHT ureter and significant RIGHT hydroureteronephrosis. Tumor is likely arising from the RIGHT lateral aspect of the urinary bladder, which appears compressed and encompassed by the mass. Mid small bowel obstruction, favor due to either to adhesions or tumor in pelvis. Diffuse colonic diverticulosis with parastomal herniation of a segment of transverse colon adjacent to the colostomy LEFT lower quadrant without evidence of colonic obstruction. Electronically Signed   By: Lavonia Dana M.D.   On: 09/11/2016 10:18    EKG: Independently reviewed. Atrial fib with RVR, RBBB    Time spent:60 minutes Code Status:   DNR Family Communication:  Son updated at bedside Disposition Plan: expect 3-4 day hospitalization Consults called: general surgery; urology DVT Prophylaxis: [place] Heparin    Alexcia Schools, DO  [facility] Pager [phone]  If 7PM-7AM, please contact night-coverage [email or web address] Password [facility] 09/11/2016, 11:50 AM

## 2016-09-11 NOTE — ED Provider Notes (Addendum)
[place] DEPT Provider Note   CSN: [id number] Arrival date & time: 09/11/16  0745     History   Chief Complaint Chief Complaint  Patient presents with  . Abdominal Pain    HPI Victoria [name] is a 83 y.o. female.  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 fib status post pacemaker placement not on anticoagulation presenting today with symptoms of nausea and vomiting that started Thursday night and no output from her ostomy from Thursday night until today. Patient states normally her ostomy puts out a large amount of stool directly after eating for the last 4 months which is causing her to lose weight but since Thursday she has had no output. She has had several episodes of vomiting per day and has not been eating. This morning at 5 AM she started having excruciating abdominal pain that's sharp in nature around her ostomy which eventually resolved spontaneously. Currently she denies any abdominal pain but it is still having nausea and vomiting. She also admits to not taking her Cardizem for the last 2 days due to nausea and vomiting.  She denies any chest pain or shortness of breath.   The history is provided by the patient.  Abdominal Pain   This is a new problem. Episode onset: 5am this morning. The problem occurs constantly. The problem has been resolved. The pain is associated with an unknown factor. The pain is located in the LLQ. The quality of the pain is colicky, cramping and throbbing. The pain is at a severity of 8/10. The pain is severe. Associated symptoms include anorexia, nausea and vomiting. Pertinent negatives include fever, diarrhea and dysuria. The symptoms are aggravated by eating. Nothing relieves the symptoms. Past medical history comments: hx of malignant neoplasm of ovary with Ostomy and prior SBO.    Past Medical  History:  Diagnosis Date  . Bladder cancer [facility]) recurrent bladder tumor    secondarty to ovarian cancer--  palliative radiation 09-13-2013 to 10-22-2013  . Cardiac pacemaker in situ 2010   [place]-- PLACEMENT 09-10-2008  . Colostomy in place [facility])   . Diverticulosis   . Fluid collection (edema) in the arms, legs, hands and feet   . Gross hematuria    unable to anticoagulate  . Hyperlipidemia   . Hypertension   . Iron deficiency anemia   . Malignant neoplasm of ovary ([place]) ONOCOLOGIST--  DR Benay Spice  03-13-2012  w/ rectal mass   dx  primay ovarian cancer , +cytokeratin 7, estrogen receptor and WT-1--  chemotherapy and s/p omentectomy and bilateral oophorectomy03-05-2013  . Nausea 2015   chronic intermittent nausea after chemo  . Nocturia   . Paroxysmal atrial flutter ([facility])    CARDIOLOGIST--  DR CROITORU  . RBBB   . SSS (sick sinus syndrome) ([place])    S/P PACEMAKER    Patient Active Problem List   Diagnosis Date Noted  . SSS (sick sinus syndrome) ([place]) 04/20/2016  . Emesis   . Atrial flutter with rapid ventricular response ([place]) 10/06/2015  . RBBB 10/06/2015  . SBO (small bowel obstruction) 10/04/2015  . Urinary tract infectious disease   . Malignant neoplasm of apex of urinary bladder ([place]) 09/29/2015  . Cancer involving bladder by direct extension from ovary ([place]) 11/18/2014  . Cancer of dome of urinary bladder ([place]) 06/27/2014  . Unspecified protein-calorie malnutrition 09/07/2013  Class: Acute  . Hematuria, gross 09/07/2013    Class: Acute  . PAT (paroxysmal atrial tachycardia) ([place]) 01/10/2013  . Malignant neoplasm of ovary ([place]) 05/08/2012  . Pelvic mass in female 03/28/2012  . Small bowel obstruction, surgery 04/04/12 03/24/2012  . Hyponatremia 03/21/2012    Class: Acute  . Acute blood loss anemia 03/21/2012    Class: Acute  . Dehydration 03/20/2012    Class: Acute  . Diarrhea 03/20/2012    Class: Acute  . Leucocytosis 03/20/2012    Class: Acute  . Cough  03/20/2012    Class: Acute  . Colon cancer ([place])   . PAF (paroxysmal atrial fibrillation) ([place]) 03/09/2012  . Diverticulosis 03/09/2012  . Cardiac pacemaker 03/09/2012  . Rectal carcinoma ([place]) 03/08/2012    Past Surgical History:  Procedure Laterality Date  . ABDOMINAL HYSTERECTOMY  1972  . BLADDER NECK SUSPENSION  1986  . CARDIAC ELECTROPHYSIOLOGY MAPPING AND ABLATION  05-21-2008   dr Caryl Comes   a-flutter  . CARDIOVASCULAR STRESS TEST  07-17-2008  dr croitoru   normal myocardial perfusion w/ attenuation artifact in the anterior region of mycardium;  no ischemia or infarct/scar/  low risk scan/  normal LV function and wall motion , ef  83%  . COLON RESECTION  04/04/2012   Procedure: COLON RESECTION;  Surgeon: Rolm Bookbinder, MD;  Location: [facility];  Service: General;  Laterality: N/A;  laparotomy with small bowel resection for obstruction with colostomy with gastrostomy tube placement.   . COLOSTOMY REVISION N/A 10/03/2012   Procedure: COLOSTOMY REVISION;  Surgeon: Rolm Bookbinder, MD;  Location: [facility];  Service: General;  Laterality: N/A;  COLOSTOMY REVISION, Sigmoid colectomy  . CYSTOSCOPY N/A 06/27/2014   Procedure: CYSTOSCOPY;  Surgeon: Jorja Loa, MD;  Location: [facility];  Service: Urology;  Laterality: N/A;  . CYSTOSCOPY N/A 11/18/2014   Procedure: CYSTOSCOPY;  Surgeon: Franchot Gallo, MD;  Location: [facility];  Service: Urology;  Laterality: N/A;  . ELECTROPHYSIOLOGY STUDY  09-26-2008  dr Caryl Comes  03-27-2012  dr croitoru   cardiac ,  no ablation in 2010/   2013  overdrive pacing atrial flutter  . LAPAROTOMY Bilateral 10/03/2012   Procedure: EXPLORATORY LAPAROTOMY;  Surgeon: Imagene Gurney A. Alycia Rossetti, MD;  Location: [facility];  Service: Gynecology;  Laterality: Bilateral;  WITH BSO, TUMOR DEBULKING  . LAPAROTOMY N/A 10/09/2015   Procedure: EXPLORATORY LAPAROTOMY LYSIS OF ADHESIONS;  Surgeon: Leighton Ruff, MD;  Location: [facility];  Service: General;  Laterality: N/A;  .  PERMANENT PACEMAKER INSERTION  09-10-2008   [place]  . PORT-A-CATH REMOVAL N/A 02/21/2013   Procedure: REMOVAL PORT-A-CATH;  Surgeon: Rolm Bookbinder, MD;  Location: [facility];  Service: General;  Laterality: N/A;  . PORTACATH PLACEMENT  05/04/2012   Procedure: INSERTION PORT-A-CATH;  Surgeon: Rolm Bookbinder, MD;  Location: [facility];  Service: General;  Laterality: Right;  . Rib removed  1934   Pleurisy and pneumonia  . TRANSTHORACIC ECHOCARDIOGRAM  07-17-2008     ef >55%,  mild to moderate MR and TR,  mild  sclerotic AV without stenosis,    . TRANSURETHRAL RESECTION OF BLADDER TUMOR N/A 06/27/2014   Procedure: TRANSURETHRAL RESECTION OF BLADDER TUMOR (TURBT);  Surgeon: Jorja Loa, MD;  [facility];  Service: Urology;  Laterality: N/A;  . TRANSURETHRAL RESECTION OF BLADDER TUMOR N/A 11/18/2014   Procedure: TRANSURETHRAL RESECTION OF BLADDER TUMOR (TURBT);  Surgeon: Franchot Gallo, MD;  Location[facility];  Service: Urology;  Laterality: N/A;  . TRANSURETHRAL  RESECTION OF BLADDER TUMOR N/A 04/15/2016   Procedure: TRANSURETHRAL RESECTION OF BLADDER TUMOR;  Surgeon: Franchot Gallo, MD;  [facility];  Service: Urology;  Laterality: N/A;  . TRANSURETHRAL RESECTION OF BLADDER TUMOR WITH GYRUS (TURBT-GYRUS) N/A 09/05/2013   Procedure: TRANSURETHRAL RESECTION OF BLADDER TUMOR WITH GYRUS (TURBT-GYRUS);  Surgeon: Fredricka Bonine, MD;  [facility];  Service: Urology;  Laterality: N/A;  . TRANSURETHRAL RESECTION OF BLADDER TUMOR WITH GYRUS (TURBT-GYRUS) N/A 09/29/2015   Procedure: TRANSURETHRAL RESECTION OF BLADDER TUMOR WITH GYRUS (TURBT-GYRUS);  Surgeon: Franchot Gallo, MD;  [facility];  Service: Urology;  Laterality: N/A;    OB History    Gravida Para Term Preterm AB Living   2 2           SAB TAB Ectopic Multiple Live Births                  Obstetric Comments   1st menses age 61, 1st pregnancy age 11       Home Medications     Prior to Admission medications   Medication Sig Start Date End Date Taking? Authorizing Provider  diltiazem (CARDIZEM CD) 360 MG 24 hr capsule take 1 capsule by mouth once daily 10/21/15   Sanda Klein, MD  furosemide (LASIX) 20 MG tablet take 1 tablet by mouth once daily 09/01/16   Sanda Klein, MD  iron polysaccharides (NIFEREX) 150 MG capsule Take 150 mg by mouth 3 (three) times a week.    Historical Provider, MD  lactose free nutrition (BOOST) LIQD Take 237 mLs by mouth 2 (two) times a week.     Historical Provider, MD  potassium chloride (K-DUR) 10 MEQ tablet take 1 tablet by mouth once daily 09/01/16   Sanda Klein, MD    Family History Family History  Problem Relation Age of Onset  . Heart disease Mother   . CAD Mother   . Stroke Sister   . Cancer Neg Hx   . Diabetes Neg Hx     Social History Social History  Substance Use Topics  . Smoking status: Never Smoker  . Smokeless tobacco: Never Used  . Alcohol use No     Allergies   Pindolol; Iodine; Shellfish allergy; Vibramycin [doxycycline calcium]; Doxycycline; Levofloxacin; Multaq [dronedarone]; and Vicodin [hydrocodone-acetaminophen]   Review of Systems Review of Systems  Constitutional: Negative for fever.  Gastrointestinal: Positive for abdominal pain, anorexia, nausea and vomiting. Negative for diarrhea.  Genitourinary: Negative for dysuria.  All other systems reviewed and are negative.    Physical Exam Updated Vital Signs BP 132/80 (BP Location: Left Arm)   Pulse 63   Resp 16   SpO2 95%   Physical Exam  Constitutional: She is oriented to person, place, and time. She appears well-developed and well-nourished. No distress.  HENT:  Head: Normocephalic and atraumatic.  Mouth/Throat: Oropharynx is clear and moist.  Eyes: Conjunctivae and EOM are normal. Pupils are equal, round, and reactive to light.  Neck: Normal range of motion. Neck supple.  Cardiovascular: Intact distal pulses.  An irregularly  irregular rhythm present. Tachycardia present.   No murmur heard. Pulmonary/Chest: Effort normal and breath sounds normal. No respiratory distress. She has no wheezes. She has no rales.  Abdominal: Soft. She exhibits distension. There is no tenderness. There is no rebound and no guarding.    Musculoskeletal: Normal range of motion. She exhibits no edema or tenderness.  Neurological: She is alert and oriented to person, place, and time.  Skin: Skin is  warm and dry. Capillary refill takes 2 to 3 seconds. No rash noted. No erythema.  Psychiatric: She has a normal mood and affect. Her behavior is normal.  Nursing note and vitals reviewed.    ED Treatments / Results  Labs (all labs ordered are listed, but only abnormal results are displayed) Labs Reviewed  CBC WITH DIFFERENTIAL/PLATELET - Abnormal; Notable for the following:       Result Value   WBC 15.9 (*)    Hemoglobin 11.7 (*)    HCT 35.6 (*)    RDW 17.2 (*)    Neutro Abs 14.3 (*)    All other components within normal limits  COMPREHENSIVE METABOLIC PANEL - Abnormal; Notable for the following:    Sodium 130 (*)    Chloride 94 (*)    Glucose, Bld 174 (*)    Creatinine, Ser 1.74 (*)    Albumin 2.9 (*)    ALT 9 (*)    GFR calc non Af Amer 25 (*)    GFR calc Af Amer 29 (*)    All other components within normal limits  I-STAT CG4 LACTIC ACID, ED - Abnormal; Notable for the following:    Lactic Acid, Venous 3.19 (*)    All other components within normal limits  LIPASE, BLOOD  LACTIC ACID, PLASMA  URINALYSIS, ROUTINE W REFLEX MICROSCOPIC    EKG  EKG Interpretation  Date/Time:  Saturday September 11 2016 08:25:02 EST Ventricular Rate:  175 PR Interval:    QRS Duration: 125 QT Interval:  257 QTC Calculation: 439 R Axis:   113 Text Interpretation:  Atrial fibrillation with rapid ventricular response Right bundle branch block Artifact in lead(s) I III aVR aVL aVF Confirmed by Maryan Rued  MD, Loree Fee ([id number]) on 09/11/2016 8:34:28  AM Also confirmed by Maryan Rued  MD, Demarrius Guerrero ([id number]), editor [place] CT, Leda Gauze [phone])  on 09/11/2016 8:57:02 AM       Radiology Ct Abdomen Pelvis Wo Contrast  Result Date: 09/11/2016 CLINICAL DATA:  Abdominal pain, nausea, and vomiting, decreased ostomy output since Thursday, suspected bowel obstruction, history of ovarian cancer 2013, bladder cancer 2015, paroxysmal atrial flutter EXAM: CT ABDOMEN AND PELVIS WITHOUT CONTRAST TECHNIQUE: Multidetector CT imaging of the abdomen and pelvis was performed following the standard protocol without IV contrast. Sagittal and coronal MPR images reconstructed from axial data set. No oral contrast was administered. COMPARISON:  10/04/2015 FINDINGS: Lower chest: Bibasilar atelectasis greater on RIGHT Hepatobiliary: Gallbladder unremarkable. No definite focal liver lesion. Pancreas: Atrophic without mass Spleen: Normal appearance Adrenals/Urinary Tract: Adrenal glands normal appearance. Tiny nonobstructing calculus LEFT kidney. LEFT kidney in ureter otherwise normal appearance. Marked RIGHT hydronephrosis and hydroureter since previous exam. Tiny nonobstructing calculus within the RIGHT renal pelvis. RIGHT ureteral dilatation terminates at a large soft tissue mass in the RIGHT pelvis compatible with neoplasm, likely representing significant interval in growth with of the partially necrotic tumor mass identified in the RIGHT pelvis on the previous exam now measuring 11.5 x 9.6 x 9.5 cm, likely arising from the RIGHT lateral aspect of the bladder. Air is seen within urinary bladder. Additional foci of air seen within the mass likely representing necrosis/cavitation. The mass is contiguous with multiple pelvic bowel loops. Stomach/Bowel: Stomach partially distended. Dilated proximal small bowel with decompressed distal small bowel compatible small bowel obstruction. Point of transition is not clearly delineated but appears to be in the pelvis question related to adhesion or  observed RIGHT pelvic tumor. Colon appears decompressed. Scattered colonic diverticulosis. LEFT lower quadrant colostomy. Parastomal  herniation of a segment of transverse colon without colonic obstruction/dilatation. Vascular/Lymphatic: Atherosclerotic calcifications without aneurysm. Pacemaker leads RIGHT atrium and RIGHT ventricle. No definite adenopathy. Reproductive: Uterus surgically absent by history. Ovaries not visualized. Other: Scattered free fluid.  No definite free air. Musculoskeletal: Osseous demineralization with mild scoliosis and diffuse degenerative disc disease changes of the lumbar spine IMPRESSION: Large Ashley necrotic tumor mass in RIGHT pelvis 11.5 x 9.6 x 9.5 cm, causing obstruction of the RIGHT ureter and significant RIGHT hydroureteronephrosis. Tumor is likely arising from the RIGHT lateral aspect of the urinary bladder, which appears compressed and encompassed by the mass. Mid small bowel obstruction, favor due to either to adhesions or tumor in pelvis. Diffuse colonic diverticulosis with parastomal herniation of a segment of transverse colon adjacent to the colostomy LEFT lower quadrant without evidence of colonic obstruction. Electronically Signed   By: Lavonia Dana M.D.   On: 09/11/2016 10:18    Procedures Procedures (including critical care time)  Medications Ordered in ED Medications  0.9 %  sodium chloride infusion ( Intravenous New Bag/Given 09/11/16 0857)  diltiazem (CARDIZEM) 1 mg/mL load via infusion 10 mg (10 mg Intravenous Bolus from Bag 09/11/16 0855)    And  diltiazem (CARDIZEM) 100 mg in dextrose 5% 127mL (1 mg/mL) infusion (15 mg/hr Intravenous Rate/Dose Change 09/11/16 1032)  sodium chloride 0.9 % bolus 500 mL (0 mLs Intravenous Stopped 09/11/16 0857)  ondansetron (ZOFRAN) injection 4 mg (4 mg Intravenous Given 09/11/16 0833)  sodium chloride 0.9 % bolus 500 mL (0 mLs Intravenous Stopped 09/11/16 0931)     Initial Impression / Assessment and Plan / ED Course    I have reviewed the triage vital signs and the nursing notes.  Pertinent labs & imaging results that were available during my care of the patient were reviewed by me and considered in my medical decision making (see chart for details).     Patient is an elderly female presenting today with symptoms concerning for a small bowel obstruction. Patient is nontoxic appearing and able to answer questions. She does appear mildly dehydrated but no signs of fluid overload. She also admits to not taking her Cardizem for the last 2 days for her atrial fibrillation due to nausea and vomiting. She was having abdominal pain earlier today which is currently resolved. Low suspicion for perforation. Low suspicion that patient's symptoms are cardiac in nature but she does appear to be in A. fib RVR just most likely related to current illness, dehydration and lack of home meds. CBC, CMP, lipase, CT of the abdomen and pelvis pending. Patient given IV fluids and Zofran. She is currently in no pain.  9:17 AM Patient's lactic acid is elevated at 3.19 with a leukocytosis of 15.9. CMP and lipase pending. EKG showed A. fib with RVR and heart rate of 170. She was given a Cardizem bolus and drip as well as IV fluids.  11:31 AM CT showed small bowel obstruction as well as large pelvic mass with ureteral obstruction and hydronephrosis. Discussed with Dr. Barry Dienes with general surgery recommended NG tube and they will consult on the patient. Secondly spoke with Dr. Loel Lofty with urology and at this time they will follow to ensure creatinine improves with fluids but patient is not a candidate for stenting patient does not desire to have a nephrostomy tube. Spoke with hospitalist for admission. Patient's heart rate remained stable on Cardizem drip. Patient is feeling better. Repeat lactate at 1145 however feel this is more related to her GI symptoms  and dehydration. Low suspicion for sepsis.  CRITICAL CARE Performed by:  Blanchie Dessert Total critical care time: 35 minutes Critical care time was exclusive of separately billable procedures and treating other patients. Critical care was necessary to treat or prevent imminent or life-threatening deterioration. Critical care was time spent personally by me on the following activities: development of treatment plan with patient and/or surrogate as well as nursing, discussions with consultants, evaluation of patient's response to treatment, examination of patient, obtaining history from patient or surrogate, ordering and performing treatments and interventions, ordering and review of laboratory studies, ordering and review of radiographic studies, pulse oximetry and re-evaluation of patient's condition.   Final Clinical Impressions(s) / ED Diagnoses   Final diagnoses:  Small bowel obstruction  Ovarian mass, right  Other hydronephrosis  Dehydration  Atrial fibrillation with RVR [facility])    New Prescriptions New Prescriptions   No medications on file     Blanchie Dessert, MD 09/11/16 1132    Blanchie Dessert, MD 09/11/16 1139

## 2016-09-11 NOTE — Consult Note (Signed)
Reason for Consult:SBO Referring Physician: Blanchie Dessert, MD  Victoria [name] is an 83 y.o. female.  HPI:  Pt is an 83 yo F with long history of ovarian cancer.  She was dx with ovarian cancer in 2013 when she had rectal mass.  She has had several surgeries including an partial sigmoid colectomy with end colostomy 2013.  Nancy Marus and Serita Grammes did original surgery. She had a prolapsed ostomy and had this revised in 2014.  She had an SBO with internal hernia requiring ex lap March 2017. She reports that she had a SBO in September that resolved without surgery.  These notes are not in the computer.    She has intermittent ureteral obstruction secondary to recurrent pelvic tumor as well that is being treated/followed by Dr. Diona Fanti.    She presents with no stool or gas in bag for 2 days.  Normally she has both after eating every meal.  She has been having nausea and vomiting.  Since she quit eating 2 days ago, she has not had any more emesis.  This morning she developed severe abdominal pain.    Of note, she is not on any systemic therapy.  She has decided to not pursue chemo any more, but just to have treatments that provide symptoms relief.  Before Thursday, she has   Past Medical History:  Diagnosis Date  . Bladder cancer [facility]) recurrent bladder tumor    secondarty to ovarian cancer--  palliative radiation 09-13-2013 to 10-22-2013  . Cardiac pacemaker in situ 2010   [place]-- PLACEMENT 09-10-2008  . Colostomy in place [facility])   . Diverticulosis   . Fluid collection (edema) in the arms, legs, hands and feet   . Gross hematuria    unable to anticoagulate  . Hyperlipidemia   . Hypertension   . Iron deficiency anemia   . Malignant neoplasm of ovary ([facility]) ONOCOLOGIST--  DR Benay Spice  03-13-2012  w/ rectal mass   dx  primay ovarian cancer , +cytokeratin 7, estrogen receptor and WT-1--  chemotherapy and s/p omentectomy and bilateral oophorectomy03-05-2013  . Nausea 2015   chronic  intermittent nausea after chemo  . Nocturia   . Paroxysmal atrial flutter ([facility])    CARDIOLOGIST--  DR CROITORU  . RBBB   . SSS (sick sinus syndrome) (H[facility])    S/P PACEMAKER    Past Surgical History:  Procedure Laterality Date  . ABDOMINAL HYSTERECTOMY  1972  . BLADDER NECK SUSPENSION  1986  . CARDIAC ELECTROPHYSIOLOGY MAPPING AND ABLATION  05-21-2008   dr Caryl Comes   a-flutter  . CARDIOVASCULAR STRESS TEST  07-17-2008  dr croitoru   normal myocardial perfusion w/ attenuation artifact in the anterior region of mycardium;  no ischemia or infarct/scar/  low risk scan/  normal LV function and wall motion , ef  83%  . COLON RESECTION  04/04/2012   Procedure: COLON RESECTION;  Surgeon: Rolm Bookbinder, MD;  Location: [facility];  Service: General;  Laterality: N/A;  laparotomy with small bowel resection for obstruction with colostomy with gastrostomy tube placement.   . COLOSTOMY REVISION N/A 10/03/2012   Procedure: COLOSTOMY REVISION;  Surgeon: Rolm Bookbinder, MD;  Location: [facility];  Service: General;  Laterality: N/A;  COLOSTOMY REVISION, Sigmoid colectomy  . CYSTOSCOPY N/A 06/27/2014   Procedure: CYSTOSCOPY;  Surgeon: Jorja Loa, MD;  Location: [facility];  Service: Urology;  Laterality: N/A;  . CYSTOSCOPY N/A 11/18/2014   Procedure: CYSTOSCOPY;  Surgeon: Franchot Gallo, MD;  Location: [facility];  Service: Urology;  Laterality: N/A;  . ELECTROPHYSIOLOGY STUDY  09-26-2008  dr klein&  03-27-2012  dr croitoru   cardiac ,  no ablation in 2010/   2013  overdrive pacing atrial flutter  . LAPAROTOMY Bilateral 10/03/2012   Procedure: EXPLORATORY LAPAROTOMY;  Surgeon: Paola A. Gehrig, MD;  Location: [facility];  Service: Gynecology;  Laterality: Bilateral;  WITH BSO, TUMOR DEBULKING  . LAPAROTOMY N/A 10/09/2015   Procedure: EXPLORATORY LAPAROTOMY LYSIS OF ADHESIONS;  Surgeon: Alicia Thomas, MD;  Location: [facility];  Service: General;  Laterality: N/A;  . PERMANENT PACEMAKER INSERTION   09-10-2008   [facility]  . PORT-A-CATH REMOVAL N/A 02/21/2013   Procedure: REMOVAL PORT-A-CATH;  Surgeon: Matthew Wakefield, MD;  Location: [facility];  Service: General;  Laterality: N/A;  . PORTACATH PLACEMENT  05/04/2012   Procedure: INSERTION PORT-A-CATH;  Surgeon: Matthew Wakefield, MD;  Location: [facility];  Service: General;  Laterality: Right;  . Rib removed  1934   Pleurisy and pneumonia  . TRANSTHORACIC ECHOCARDIOGRAM  07-17-2008     ef >55%,  mild to moderate MR and TR,  mild  sclerotic AV without stenosis,    . TRANSURETHRAL RESECTION OF BLADDER TUMOR N/A 06/27/2014   Procedure: TRANSURETHRAL RESECTION OF BLADDER TUMOR (TURBT);  Surgeon: Stephen M Dahlstedt, MD;  Location: [facility];  Service: Urology;  Laterality: N/A;  . TRANSURETHRAL RESECTION OF BLADDER TUMOR N/A 11/18/2014   Procedure: TRANSURETHRAL RESECTION OF BLADDER TUMOR (TURBT);  Surgeon: Stephen Dahlstedt, MD;  Location: [facility];  Service: Urology;  Laterality: N/A;  . TRANSURETHRAL RESECTION OF BLADDER TUMOR N/A 04/15/2016   Procedure: TRANSURETHRAL RESECTION OF BLADDER TUMOR;  Surgeon: Stephen Dahlstedt, MD;  Location: [facility];  Service: Urology;  Laterality: N/A;  . TRANSURETHRAL RESECTION OF BLADDER TUMOR WITH GYRUS (TURBT-GYRUS) N/A 09/05/2013   Procedure: TRANSURETHRAL RESECTION OF BLADDER TUMOR WITH GYRUS (TURBT-GYRUS);  Surgeon: Matthew Ramsey Eskridge, MD;  Location[facility];  Service: Urology;  Laterality: N/A;  . TRANSURETHRAL RESECTION OF BLADDER TUMOR WITH GYRUS (TURBT-GYRUS) N/A 09/29/2015   Procedure: TRANSURETHRAL RESECTION OF BLADDER TUMOR WITH GYRUS (TURBT-GYRUS);  Surgeon: Stephen Dahlstedt, MD;  Location: [facility];  Service: Urology;  Laterality: N/A;    Family History  Problem Relation Age of Onset  . Heart disease Mother   . CAD Mother   . Stroke Sister   . Cancer Neg Hx   . Diabetes Neg Hx     Social History:  reports that she has never smoked. She has never used smokeless tobacco. She reports  that she does not drink alcohol or use drugs.  Allergies:  Allergies  Allergen Reactions  . Pindolol Nausea Only and Swelling    Throat swells  . Iodine Other (See Comments)     TOPICAL IODINE///BLISTERS  . Shellfish Allergy Diarrhea and Nausea And Vomiting  . Vibramycin [Doxycycline Calcium] Nausea Only and Other (See Comments)    dizziness  . Doxycycline Nausea Only  . Levofloxacin Other (See Comments)    DIZZINESS  . Multaq [Dronedarone] Other (See Comments)    CAUSED SEVERE BURNING GI TRACT  . Vicodin [Hydrocodone-Acetaminophen] Other (See Comments)    dizziness    Medications:   diltiazem (CARDIZEM CD) 360 MG 24 hr capsule    furosemide (LASIX) 20 MG tablet    iron polysaccharides (NIFEREX) 150 MG capsule    nystatin-triamcinolone ointment (MYCOLOG)    potassium chloride (K-DUR) 10 MEQ tablet     Results for orders placed or performed during   the hospital encounter of 09/11/16 (from the past 48 hour(s))  CBC with Differential/Platelet     Status: Abnormal   Collection Time: 09/11/16  8:27 AM  Result Value Ref Range   WBC 15.9 (H) 4.0 - 10.5 K/uL   RBC 4.10 3.87 - 5.11 MIL/uL   Hemoglobin 11.7 (L) 12.0 - 15.0 g/dL   HCT 35.6 (L) 36.0 - 46.0 %   MCV 86.8 78.0 - 100.0 fL   MCH 28.5 26.0 - 34.0 pg   MCHC 32.9 30.0 - 36.0 g/dL   RDW 17.2 (H) 11.5 - 15.5 %   Platelets 318 150 - 400 K/uL   Neutrophils Relative % 90 %   Neutro Abs 14.3 (H) 1.7 - 7.7 K/uL   Lymphocytes Relative 5 %   Lymphs Abs 0.8 0.7 - 4.0 K/uL   Monocytes Relative 5 %   Monocytes Absolute 0.8 0.1 - 1.0 K/uL   Eosinophils Relative 0 %   Eosinophils Absolute 0.0 0.0 - 0.7 K/uL   Basophils Relative 0 %   Basophils Absolute 0.0 0.0 - 0.1 K/uL  Comprehensive metabolic panel     Status: Abnormal   Collection Time: 09/11/16  8:27 AM  Result Value Ref Range   Sodium 130 (L) 135 - 145 mmol/L   Potassium 4.5 3.5 - 5.1 mmol/L   Chloride 94 (L) 101 - 111 mmol/L   CO2 28 22 - 32 mmol/L   Glucose, Bld 174  (H) 65 - 99 mg/dL   BUN 19 6 - 20 mg/dL   Creatinine, Ser 1.74 (H) 0.44 - 1.00 mg/dL   Calcium 9.2 8.9 - 10.3 mg/dL   Total Protein 6.9 6.5 - 8.1 g/dL   Albumin 2.9 (L) 3.5 - 5.0 g/dL   AST 20 15 - 41 U/L   ALT 9 (L) 14 - 54 U/L   Alkaline Phosphatase 82 38 - 126 U/L   Total Bilirubin 0.9 0.3 - 1.2 mg/dL   GFR calc non Af Amer 25 (L) >60 mL/min   GFR calc Af Amer 29 (L) >60 mL/min    Comment: (NOTE) The eGFR has been calculated using the CKD EPI equation. This calculation has not been validated in all clinical situations. eGFR's persistently <60 mL/min signify possible Chronic Kidney Disease.    Anion gap 8 5 - 15  Lipase, blood     Status: None   Collection Time: 09/11/16  8:27 AM  Result Value Ref Range   Lipase 14 11 - 51 U/L  I-Stat CG4 Lactic Acid, ED     Status: Abnormal   Collection Time: 09/11/16  8:43 AM  Result Value Ref Range   Lactic Acid, Venous 3.19 (HH) 0.5 - 1.9 mmol/L   Comment NOTIFIED PHYSICIAN     Ct Abdomen Pelvis Wo Contrast  Result Date: 09/11/2016 CLINICAL DATA:  Abdominal pain, nausea, and vomiting, decreased ostomy output since Thursday, suspected bowel obstruction, history of ovarian cancer 2013, bladder cancer 2015, paroxysmal atrial flutter EXAM: CT ABDOMEN AND PELVIS WITHOUT CONTRAST TECHNIQUE: Multidetector CT imaging of the abdomen and pelvis was performed following the standard protocol without IV contrast. Sagittal and coronal MPR images reconstructed from axial data set. No oral contrast was administered. COMPARISON:  10/04/2015 FINDINGS: Lower chest: Bibasilar atelectasis greater on RIGHT Hepatobiliary: Gallbladder unremarkable. No definite focal liver lesion. Pancreas: Atrophic without mass Spleen: Normal appearance Adrenals/Urinary Tract: Adrenal glands normal appearance. Tiny nonobstructing calculus LEFT kidney. LEFT kidney in ureter otherwise normal appearance. Marked RIGHT hydronephrosis and hydroureter since previous exam.   Tiny nonobstructing  calculus within the RIGHT renal pelvis. RIGHT ureteral dilatation terminates at a large soft tissue mass in the RIGHT pelvis compatible with neoplasm, likely representing significant interval in growth with of the partially necrotic tumor mass identified in the RIGHT pelvis on the previous exam now measuring 11.5 x 9.6 x 9.5 cm, likely arising from the RIGHT lateral aspect of the bladder. Air is seen within urinary bladder. Additional foci of air seen within the mass likely representing necrosis/cavitation. The mass is contiguous with multiple pelvic bowel loops. Stomach/Bowel: Stomach partially distended. Dilated proximal small bowel with decompressed distal small bowel compatible small bowel obstruction. Point of transition is not clearly delineated but appears to be in the pelvis question related to adhesion or observed RIGHT pelvic tumor. Colon appears decompressed. Scattered colonic diverticulosis. LEFT lower quadrant colostomy. Parastomal herniation of a segment of transverse colon without colonic obstruction/dilatation. Vascular/Lymphatic: Atherosclerotic calcifications without aneurysm. Pacemaker leads RIGHT atrium and RIGHT ventricle. No definite adenopathy. Reproductive: Uterus surgically absent by history. Ovaries not visualized. Other: Scattered free fluid.  No definite free air. Musculoskeletal: Osseous demineralization with mild scoliosis and diffuse degenerative disc disease changes of the lumbar spine IMPRESSION: Large Ashley necrotic tumor mass in RIGHT pelvis 11.5 x 9.6 x 9.5 cm, causing obstruction of the RIGHT ureter and significant RIGHT hydroureteronephrosis. Tumor is likely arising from the RIGHT lateral aspect of the urinary bladder, which appears compressed and encompassed by the mass. Mid small bowel obstruction, favor due to either to adhesions or tumor in pelvis. Diffuse colonic diverticulosis with parastomal herniation of a segment of transverse colon adjacent to the colostomy LEFT lower  quadrant without evidence of colonic obstruction. Electronically Signed   By: Mark  Boles M.D.   On: 09/11/2016 10:18   Dg Chest Portable 1 View  Result Date: 09/11/2016 CLINICAL DATA:  Pain nausea and vomiting. EXAM: PORTABLE CHEST 1 VIEW COMPARISON:  Chest CT 03/10/2016.  Chest x-ray 03/23/2014 FINDINGS: The cardio pericardial silhouette is enlarged. Interstitial markings are diffusely coarsened with chronic features. Patchy airspace disease infrahilar left lung base. Stable scarring at the right base. The NG tube passes into the stomach although the distal tip position is not included on the film. Bones are diffusely demineralized. Telemetry leads overlie the chest. IMPRESSION: Cardiomegaly with underlying chronic interstitial changes. Left base atelectasis or infiltrate. Electronically Signed   By: Eric  Mansell M.D.   On: 09/11/2016 14:37    Review of Systems  Constitutional: Positive for malaise/fatigue.  HENT: Negative.   Eyes: Negative.   Respiratory: Negative.   Cardiovascular: Negative.   Gastrointestinal: Positive for abdominal pain, nausea and vomiting. Negative for constipation and diarrhea.  Musculoskeletal: Negative.   Skin: Negative.   Neurological: Negative.   Endo/Heme/Allergies: Negative.   Psychiatric/Behavioral: Negative.      Blood pressure 121/69, pulse 94, temperature 98.3 F (36.8 C), temperature source Oral, resp. rate (!) 21, SpO2 97 %. Physical Exam  Constitutional: She is oriented to person, place, and time. She appears well-developed and well-nourished. No distress.  HENT:  Head: Normocephalic and atraumatic.  Eyes: Conjunctivae are normal. Pupils are equal, round, and reactive to light. Right eye exhibits no discharge. Left eye exhibits no discharge. No scleral icterus.  Neck: Neck supple.  Cardiovascular: Normal rate, regular rhythm and intact distal pulses.   Respiratory: Effort normal. No respiratory distress.  GI: Soft. She exhibits no distension.  There is no tenderness. There is no rebound and no guarding.  Ostomy pink.  No stool or gas   in bag.  Musculoskeletal: Normal range of motion. She exhibits no edema or tenderness.  Neurological: She is alert and oriented to person, place, and time. Coordination normal.  Skin: Skin is warm and dry. No rash noted. She is not diaphoretic. No erythema. No pallor.  Psychiatric: She has a normal mood and affect. Her behavior is normal. Judgment and thought content normal.    Assessment/Plan: pSBO- NGT, NPO, small bowel protocol. Progressive ovarian cancer with ureteral obstruction and sbo  I asked patient if non operative therapy was not successful, if she would want to have surgery.  She was not willing to answer this and stated that she was just praying that it would get better.  I advised her to think this over and discuss with family.  Recommend palliative care consult to start discussions.  Currently the patient has been doing well at home until 2 days ago, but continues to have issues from the pelvic tumor requiring urology intervention.  At some point, she will succomb to this progressive cancer.  The time course is insidious, but there will be some problem that will not resolve without major intervention.    BYERLY,FAERA 09/11/2016, 3:04 PM     

## 2016-09-11 NOTE — ED Triage Notes (Signed)
Per pt, states her ostomy has been emptying as soon as she eats on and off for months-states it stopped on Thursday and now she is having pain, nausea, and vomiting-surgery to remove scar tissue around small intestine in March of last year

## 2016-09-12 ENCOUNTER — Inpatient Hospital Stay (HOSPITAL_COMMUNITY): Payer: Medicare Other

## 2016-09-12 ENCOUNTER — Encounter (HOSPITAL_COMMUNITY): Payer: Self-pay | Admitting: Surgery

## 2016-09-12 DIAGNOSIS — Z933 Colostomy status: Secondary | ICD-10-CM

## 2016-09-12 DIAGNOSIS — N179 Acute kidney failure, unspecified: Secondary | ICD-10-CM

## 2016-09-12 DIAGNOSIS — L8990 Pressure ulcer of unspecified site, unspecified stage: Secondary | ICD-10-CM | POA: Insufficient documentation

## 2016-09-12 LAB — BASIC METABOLIC PANEL
ANION GAP: 8 (ref 5–15)
BUN: 17 mg/dL (ref 6–20)
CHLORIDE: 100 mmol/L — AB (ref 101–111)
CO2: 26 mmol/L (ref 22–32)
Calcium: 8.3 mg/dL — ABNORMAL LOW (ref 8.9–10.3)
Creatinine, Ser: 1.46 mg/dL — ABNORMAL HIGH (ref 0.44–1.00)
GFR calc Af Amer: 36 mL/min — ABNORMAL LOW (ref >60)
GFR, EST NON AFRICAN AMERICAN: 31 mL/min — AB (ref >60)
GLUCOSE: 114 mg/dL — AB (ref 65–99)
Potassium: 3.9 mmol/L (ref 3.5–5.1)
Sodium: 134 mmol/L — ABNORMAL LOW (ref 135–145)

## 2016-09-12 LAB — CBC WITH DIFFERENTIAL/PLATELET
BASOS ABS: 0 10*3/uL (ref 0.0–0.1)
Basophils Relative: 0 %
Eosinophils Absolute: 0 10*3/uL (ref 0.0–0.7)
Eosinophils Relative: 0 %
HEMATOCRIT: 33.7 % — AB (ref 36.0–46.0)
HEMOGLOBIN: 11 g/dL — AB (ref 12.0–15.0)
LYMPHS ABS: 0.3 10*3/uL — AB (ref 0.7–4.0)
LYMPHS PCT: 3 %
MCH: 28.4 pg (ref 26.0–34.0)
MCHC: 32.6 g/dL (ref 30.0–36.0)
MCV: 86.9 fL (ref 78.0–100.0)
Monocytes Absolute: 0.6 10*3/uL (ref 0.1–1.0)
Monocytes Relative: 5 %
Neutro Abs: 12.7 10*3/uL — ABNORMAL HIGH (ref 1.7–7.7)
Neutrophils Relative %: 92 %
Platelets: 289 10*3/uL (ref 150–400)
RBC: 3.88 MIL/uL (ref 3.87–5.11)
RDW: 17.4 % — ABNORMAL HIGH (ref 11.5–15.5)
WBC: 13.7 10*3/uL — AB (ref 4.0–10.5)

## 2016-09-12 LAB — CBC
HEMATOCRIT: 32.7 % — AB (ref 36.0–46.0)
HEMOGLOBIN: 10.7 g/dL — AB (ref 12.0–15.0)
MCH: 28.4 pg (ref 26.0–34.0)
MCHC: 32.7 g/dL (ref 30.0–36.0)
MCV: 86.7 fL (ref 78.0–100.0)
Platelets: 315 10*3/uL (ref 150–400)
RBC: 3.77 MIL/uL — ABNORMAL LOW (ref 3.87–5.11)
RDW: 17.3 % — ABNORMAL HIGH (ref 11.5–15.5)
WBC: 11.9 10*3/uL — ABNORMAL HIGH (ref 4.0–10.5)

## 2016-09-12 MED ORDER — DIATRIZOATE MEGLUMINE & SODIUM 66-10 % PO SOLN
90.0000 mL | Freq: Once | ORAL | Status: AC
  Administered 2016-09-12: 90 mL via NASOGASTRIC
  Filled 2016-09-12: qty 90

## 2016-09-12 NOTE — Progress Notes (Signed)
PROGRESS NOTE    Victoria [name]  [id number] DOB: [birth year] DOA: 09/11/2016 PCP: Donnajean Lopes, MD  Outpatient Specialists:   Dr Victorino Sparrow Dr. Diona Fanti Dr croituru  Brief Narrative:  2 ? Ov Ca diag as rectal mass 03/13/12-s/p lap + Jej-Jej bypass + loop colostomy 04/04/12 Sigmoidectomy-revision end ostomy 10/03/12  Invasion of urinary bladder at TURBT and Fulguration 09/02/13  Ex-lap lysis 10/09/15 for SBO 4 previous palliative resections--on 09/18/2103, 07/02/2014, 11/21/2014 and 10/13/2015 as well as palaitive XRT SSS-Afib CHad2Vasc2 score=4 s/p PPM st Jude implant 2010-Atrial paced  Admit with SBO, N/V/decreasing Ostomy OP. Rapid Afib Na 130, Creat 1.7 [basleine 14/0.9] Lactate 3.19. Wbc 15  Assessment & Plan:   Active Problems:   PAF (paroxysmal atrial fibrillation) (HCC)   Hyponatremia   Protein-calorie malnutrition (HCC)   SBO (small bowel obstruction)   RBBB   SSS (sick sinus syndrome) (HCC)   Atrial fibrillation with RVR (HCC)   AKI (acute kidney injury) ([place])   Hydronephrosis of right kidney   Pressure injury of skin   SBO- appreciate Gen surgery input-S/p Adhesiolysis 09/2015.  Hopeful for resolution.  NG to wall suction out only 70 cc? --put out 220 to RN.  Gastrograffin axr testing  [id number]. Rpt AXR in am.  Saline 75 cc/h.   Sss/Afib CHad2Vasc2 score=4/ ppm implant 2010 IN RVR on admission-Continue IV cardizem-poor GI absorpitvity-keep in sdu for now.  Convert when more relaibly can take PO Ovarian Ca s/p Jej-Jej bypass/loop colostomy 03/2012 with Kidney invasion-Kindey function stabilizing.  Cont saline-do not thin will ned per c nephrostomy placement if creat continues to resolve Hyponatremia-hypovolemic-acute kidney injury, initial concern for obstructive uropathy on admission-now resolving.  Continuing NS 75 cc/h.  Primary UrologuistDahlstedt to see patient for further decision making re: perc drain/Double-J stents 2/19 Leukocytosis-afebrile, no source  infection-L base infiltrate? Rpt CXr am-hold abx, follow blood cult   Can transfer to tele on Cardizem-has been stbale Hopeful for resolution in 1-2 days otherwsie may require surgery D/w husband at bedsdie inpatient  Consultants:   Gen surg  Urology  Procedures:     Antimicrobials:   none    Subjective:  fair tol NG tube  a little uncomfortable  No gas, no stool in ostomy otherwise fair Still in RVR onmonitor but stable  Objective: Vitals:   09/12/16 0421 09/12/16 0500 09/12/16 0600 09/12/16 0700  BP:   116/71   Pulse:  99 100 100  Resp:  14 15 17   Temp: 97.5 F (36.4 C)     TempSrc: Oral     SpO2:   97%   Weight:      Height:        Intake/Output Summary (Last 24 hours) at 09/12/16 0737 Last data filed at 09/12/16 0700  Gross per 24 hour  Intake          3374.59 ml  Output              570 ml  Net          2804.59 ml   Filed Weights   09/11/16 1435  Weight: 50.2 kg (110 lb 10.7 oz)    Examination:  General exam: Appears calm and comfortable  Respiratory system: Clear to auscultation.  Cardiovascular system: S1 & S2 heard, RRR. No JVD, murmurs, rubs, gallops or clicks. No pedal edema. Gastrointestinal system: Abdomen is slight distedned, Ostomy bag trickle of light green fluid, no stool Central nervous system: Alert and oriented. No focal neurological deficits. Extremities: Symmetric 5 x  5 power. Skin: No rashes, lesions or ulcers Psychiatry: Judgement and insight appear normal. Mood & affect appropriate.     Data Reviewed: I have personally reviewed following labs and imaging studies  CBC:  Recent Labs Lab 09/11/16 0827 09/12/16 0327  WBC 15.9* 11.9*  NEUTROABS 14.3*  --   HGB 11.7* 10.7*  HCT 35.6* 32.7*  MCV 86.8 86.7  PLT 318 [id number]   Basic Metabolic Panel:  Recent Labs Lab 09/11/16 0827 09/12/16 0327  NA 130* 134*  K 4.5 3.9  CL 94* 100*  CO2 28 26  GLUCOSE 174* 114*  BUN 19 17  CREATININE 1.74* 1.46*  CALCIUM 9.2  8.3*   GFR: Estimated Creatinine Clearance: 19.5 mL/min (by C-G formula based on SCr of 1.46 mg/dL (H)). Liver Function Tests:  Recent Labs Lab 09/11/16 0827  AST 20  ALT 9*  ALKPHOS 82  BILITOT 0.9  PROT 6.9  ALBUMIN 2.9*    Recent Labs Lab 09/11/16 0827  LIPASE 14   No results for input(s): AMMONIA in the last 168 hours. Coagulation Profile: No results for input(s): INR, PROTIME in the last 168 hours. Cardiac Enzymes: No results for input(s): CKTOTAL, CKMB, CKMBINDEX, TROPONINI in the last 168 hours. BNP (last 3 results) No results for input(s): PROBNP in the last 8760 hours. HbA1C: No results for input(s): HGBA1C in the last 72 hours. CBG:  Recent Labs Lab 09/11/16 2010  GLUCAP 115*   Lipid Profile: No results for input(s): CHOL, HDL, LDLCALC, TRIG, CHOLHDL, LDLDIRECT in the last 72 hours. Thyroid Function Tests: No results for input(s): TSH, T4TOTAL, FREET4, T3FREE, THYROIDAB in the last 72 hours. Anemia Panel: No results for input(s): VITAMINB12, FOLATE, FERRITIN, TIBC, IRON, RETICCTPCT in the last 72 hours. Urine analysis:    Component Value Date/Time   COLORURINE YELLOW 09/11/2016 2035   APPEARANCEUR CLOUDY (A) 09/11/2016 2035   LABSPEC 1.012 09/11/2016 2035   LABSPEC 1.020 11/21/2015 1006   PHURINE 6.0 09/11/2016 2035   GLUCOSEU NEGATIVE 09/11/2016 2035   GLUCOSEU Negative 11/21/2015 1006   HGBUR MODERATE (A) 09/11/2016 2035   BILIRUBINUR NEGATIVE 09/11/2016 2035   BILIRUBINUR Negative 11/21/2015 [place] 09/11/2016 2035   PROTEINUR 100 (A) 09/11/2016 2035   UROBILINOGEN 0.2 11/21/2015 1006   NITRITE POSITIVE (A) 09/11/2016 2035   LEUKOCYTESUR LARGE (A) 09/11/2016 2035   LEUKOCYTESUR Moderate 11/21/2015 1006   Sepsis Labs: @LABRCNTIP (procalcitonin:4,lacticidven:4)  ) Recent Results (from the past 240 hour(s))  MRSA PCR Screening     Status: None   Collection Time: 09/11/16  2:30 PM  Result Value Ref Range Status   MRSA  by PCR NEGATIVE NEGATIVE Final    Comment:        The GeneXpert MRSA Assay (FDA approved for NASAL specimens only), is one component of a comprehensive MRSA colonization surveillance program. It is not intended to diagnose MRSA infection nor to guide or monitor treatment for MRSA infections.          Radiology Studies: Ct Abdomen Pelvis Wo Contrast  Result Date: 09/11/2016 CLINICAL DATA:  Abdominal pain, nausea, and vomiting, decreased ostomy output since Thursday, suspected bowel obstruction, history of ovarian cancer 2013, bladder cancer 2015, paroxysmal atrial flutter EXAM: CT ABDOMEN AND PELVIS WITHOUT CONTRAST TECHNIQUE: Multidetector CT imaging of the abdomen and pelvis was performed following the standard protocol without IV contrast. Sagittal and coronal MPR images reconstructed from axial data set. No oral contrast was administered. COMPARISON:  10/04/2015 FINDINGS: Lower chest: Bibasilar atelectasis greater on  RIGHT Hepatobiliary: Gallbladder unremarkable. No definite focal liver lesion. Pancreas: Atrophic without mass Spleen: Normal appearance Adrenals/Urinary Tract: Adrenal glands normal appearance. Tiny nonobstructing calculus LEFT kidney. LEFT kidney in ureter otherwise normal appearance. Marked RIGHT hydronephrosis and hydroureter since previous exam. Tiny nonobstructing calculus within the RIGHT renal pelvis. RIGHT ureteral dilatation terminates at a large soft tissue mass in the RIGHT pelvis compatible with neoplasm, likely representing significant interval in growth with of the partially necrotic tumor mass identified in the RIGHT pelvis on the previous exam now measuring 11.5 x 9.6 x 9.5 cm, likely arising from the RIGHT lateral aspect of the bladder. Air is seen within urinary bladder. Additional foci of air seen within the mass likely representing necrosis/cavitation. The mass is contiguous with multiple pelvic bowel loops. Stomach/Bowel: Stomach partially distended. Dilated  proximal small bowel with decompressed distal small bowel compatible small bowel obstruction. Point of transition is not clearly delineated but appears to be in the pelvis question related to adhesion or observed RIGHT pelvic tumor. Colon appears decompressed. Scattered colonic diverticulosis. LEFT lower quadrant colostomy. Parastomal herniation of a segment of transverse colon without colonic obstruction/dilatation. Vascular/Lymphatic: Atherosclerotic calcifications without aneurysm. Pacemaker leads RIGHT atrium and RIGHT ventricle. No definite adenopathy. Reproductive: Uterus surgically absent by history. Ovaries not visualized. Other: Scattered free fluid.  No definite free air. Musculoskeletal: Osseous demineralization with mild scoliosis and diffuse degenerative disc disease changes of the lumbar spine IMPRESSION: Large Ashley necrotic tumor mass in RIGHT pelvis 11.5 x 9.6 x 9.5 cm, causing obstruction of the RIGHT ureter and significant RIGHT hydroureteronephrosis. Tumor is likely arising from the RIGHT lateral aspect of the urinary bladder, which appears compressed and encompassed by the mass. Mid small bowel obstruction, favor due to either to adhesions or tumor in pelvis. Diffuse colonic diverticulosis with parastomal herniation of a segment of transverse colon adjacent to the colostomy LEFT lower quadrant without evidence of colonic obstruction. Electronically Signed   By: Lavonia Dana M.D.   On: 09/11/2016 10:18   Dg Chest Portable 1 View  Result Date: 09/11/2016 CLINICAL DATA:  Pain nausea and vomiting. EXAM: PORTABLE CHEST 1 VIEW COMPARISON:  Chest CT 03/10/2016.  Chest x-ray 03/23/2014 FINDINGS: The cardio pericardial silhouette is enlarged. Interstitial markings are diffusely coarsened with chronic features. Patchy airspace disease infrahilar left lung base. Stable scarring at the right base. The NG tube passes into the stomach although the distal tip position is not included on the film. Bones are  diffusely demineralized. Telemetry leads overlie the chest. IMPRESSION: Cardiomegaly with underlying chronic interstitial changes. Left base atelectasis or infiltrate. Electronically Signed   By: Misty Stanley M.D.   On: 09/11/2016 14:37        Scheduled Meds: . chlorhexidine  15 mL Mouth Rinse BID  . diatrizoate meglumine-sodium  90 mL Per NG tube Once  . heparin  5,000 Units Subcutaneous Q8H  . lidocaine  1 application Topical Once  . mouth rinse  15 mL Mouth Rinse q12n4p  . sodium chloride flush  3 mL Intravenous Q12H   Continuous Infusions: . sodium chloride 75 mL/hr at 09/11/16 1805  . diltiazem (CARDIZEM) infusion 15 mg/hr (09/12/16 0700)     LOS: 1 day    Time spent: [place], MD [facility]   If 7PM-7AM, please contact night-coverage [email or web address] Password [facility] 09/12/2016, 7:37 AM

## 2016-09-12 NOTE — Progress Notes (Signed)
Assumed care of patient at . Agree with previous Nurse assessment. Patient in no acute distress, denies pain. Cardiac monitoring placed and NG tube connected to low-intermittent suction.  Barbee Shropshire. Brigitte Pulse, RN

## 2016-09-12 NOTE — Consult Note (Signed)
Urology Consult  CC: Referring physician: Orson Eva, MD Reason for referral: Right hydronephrosis  Impression/Assessment: Right hydronephrosis secondary to malignant obstruction - I have discussed the findings of right ureteral obstruction with the patient today. She is asymptomatic and her creatinine has improved slightly with hydration only currently at 1.46. She has a normal serum potassium. We discussed the significance of the obstruction and the fact that she has maintained adequate renal function to prevent electrolyte abnormality and I have gone over with her the means by which her kidney could be unobstructed. We discussed cystoscopy with stent placement as an option in some cases but not in hers with the invasion of the tumor distorting her anatomy and little possibility of finding her ureteral orifice. The other means by which her kidney could be unobstructed would be with the placement of a percutaneous nephrostomy tube. I told her that this may be permanent but there is also a possibility that a stent could be placed antegrade through the area of obstruction and this would result in the ability to remove the percutaneous nephrostomy tube but would necessitate the maintenance of a double-J stent with the need for replacement as an outpatient procedure in the future. The patient is well informed about her current condition and has elected not to proceed with nephrostomy tube placement at this time.  Plan:  I will make Dr. Diona Fanti aware of her admission.     History of Present Illness: Greg TYLAR OLLIVIERRE is a 83 y.o. 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 In addition, the patient began having 2-3 episodes of emesis beginning on 09/10/2016. CT of the  abdomen and pelvis at the time of admission showed right ureteral dilatation that terminates at a large soft tissue mass in the right pelvis. There was also dilated small bowel with decompressed distal small bowel consistent with a small bowel obstruction without clear transition point. The patient has had previous hospital admissions with rapid atrial arrhythmia often exacerbated by small bowel obstruction leading to inability to take her oral medications. The patient has had a history of recurrent small bowel obstructions related to her metastatic ovarian carcinoma with extensive pelvic involvement including invasion of her urinary bladder. The patient is not on anticoagulation secondary to her history of recurrent hematuria.  In the emergency department, the patient was afebrile and hemodynamically stable, but was found to be in atrial fibrillation with RVR with heart rate 170s.  The patient was placed on diltiazem drip and spontaneously converted back to sinus rhythm. She is saturating well on room air. She had an episode of emesis in the ED. The patient was given 1 L normal saline. General surgery and urology has been consulted by EDP. Sodium was 130 with serum creatinine 1.74. Obesity was 15.9. Lactic acid was 3.19.  Regarding her malignancy, the patient was initially diagnosed with ovarian cancer presenting as a rectal mass found on sigmoidoscopy 03/13/2012. The patient subsequently underwent a laparotomy with jejunum to jejunum bypass and loop colostomy on 04/04/2012. She subsequently underwent a sigmoidectomy and revision of her end ostomy on 10/03/2012. On 09/02/2013, the patient underwent TURBT and fulguration when it was found that her ovarian cancer had invaded her urinary bladder. More recently, the patient had expiratory laparotomy with lysis of adhesions on 10/09/2015 secondary to small obstruction. The patient states that she  had a recent cystoscopy performed by Dr. Zannie Cove one week prior to  this admission at which time there is approximately 10% invasion of the bladder by her ovarian tumor, but decision was made to observe clinically.  In speaking with the patient her greatest concern is that of a lack of bowel Movement. She has no new voiding symptoms. She also denies any flank pain. A CT scan was obtained that revealed new right hydronephrosis down to the mass in her pelvis on the right-hand side. This is new from her CT scan done in 3/17.   Past Medical History:  Diagnosis Date  . Bladder cancer [facility]) recurrent bladder tumor    secondarty to ovarian cancer--  palliative radiation 09-13-2013 to 10-22-2013  . Cardiac pacemaker in situ 2010   [place]-- PLACEMENT 09-10-2008  . Colostomy in place [facility])   . Diverticulosis   . Fluid collection (edema) in the arms, legs, hands and feet   . Gross hematuria    unable to anticoagulate  . Hyperlipidemia   . Hypertension   . Iron deficiency anemia   . Malignant neoplasm of ovary ([place]) ONOCOLOGIST--  DR Benay Spice  03-13-2012  w/ rectal mass   dx  primay ovarian cancer , +cytokeratin 7, estrogen receptor and WT-1--  chemotherapy and s/p omentectomy and bilateral oophorectomy03-05-2013  . Nausea 2015   chronic intermittent nausea after chemo  . Nocturia   . Paroxysmal atrial flutter ([facility])    CARDIOLOGIST--  DR CROITORU  . RBBB   . SSS (sick sinus syndrome) ([facility])    S/P PACEMAKER   Past Surgical History:  Procedure Laterality Date  . ABDOMINAL HYSTERECTOMY  1972  . BLADDER NECK SUSPENSION  1986  . CARDIAC ELECTROPHYSIOLOGY MAPPING AND ABLATION  05-21-2008   dr Caryl Comes   a-flutter  . CARDIOVASCULAR STRESS TEST  07-17-2008  dr croitoru   normal myocardial perfusion w/ attenuation artifact in the anterior region of mycardium;  no ischemia or infarct/scar/  low risk scan/  normal LV function and wall motion , ef  83%  . COLON RESECTION  04/04/2012   Procedure: COLON RESECTION;  Surgeon: Rolm Bookbinder, MD;  Location: [facility];   Service: General;  Laterality: N/A;  laparotomy with small bowel resection for obstruction with colostomy with gastrostomy tube placement.   . COLOSTOMY REVISION N/A 10/03/2012   Procedure: COLOSTOMY REVISION;  Surgeon: Rolm Bookbinder, MD;  Location: [facility];  Service: General;  Laterality: N/A;  COLOSTOMY REVISION, Sigmoid colectomy  . CYSTOSCOPY N/A 06/27/2014   Procedure: CYSTOSCOPY;  Surgeon: Jorja Loa, MD;  Location: [facility];  Service: Urology;  Laterality: N/A;  . CYSTOSCOPY N/A 11/18/2014   Procedure: CYSTOSCOPY;  Surgeon: Franchot Gallo, MD;  Location: [facility];  Service: Urology;  Laterality: N/A;  . ELECTROPHYSIOLOGY STUDY  09-26-2008  dr Caryl Comes  03-27-2012  dr croitoru   cardiac ,  no ablation in 2010/   2013  overdrive pacing atrial flutter  . LAPAROTOMY Bilateral 10/03/2012   Procedure: EXPLORATORY LAPAROTOMY;  Surgeon: Imagene Gurney A. Alycia Rossetti, MD;  Location: [facility];  Service: Gynecology;  Laterality: Bilateral;  WITH BSO, TUMOR DEBULKING  . LAPAROTOMY N/A 10/09/2015   Procedure: EXPLORATORY LAPAROTOMY LYSIS OF ADHESIONS;  Surgeon: Leighton Ruff, MD;  Location: [facility];  Service: General;  Laterality: N/A;  . PERMANENT PACEMAKER INSERTION  09-10-2008   [facility]  . PORT-A-CATH REMOVAL N/A 02/21/2013   Procedure: REMOVAL PORT-A-CATH;  Surgeon: Rolm Bookbinder, MD;  Location: [facility];  Service: General;  Laterality: N/A;  . PORTACATH PLACEMENT  05/04/2012   Procedure: INSERTION PORT-A-CATH;  Surgeon: Rolm Bookbinder, MD;  Location: [facility];  Service: General;  Laterality: Right;  . Rib removed  1934   Pleurisy and pneumonia  . TRANSTHORACIC ECHOCARDIOGRAM  07-17-2008     ef >55%,  mild to moderate MR and TR,  mild  sclerotic AV without stenosis,    . TRANSURETHRAL RESECTION OF BLADDER TUMOR N/A 06/27/2014   Procedure: TRANSURETHRAL RESECTION OF BLADDER TUMOR (TURBT);  Surgeon: Jorja Loa, MD;  Location: [facility];  Service: Urology;  Laterality: N/A;  .  TRANSURETHRAL RESECTION OF BLADDER TUMOR N/A 11/18/2014   Procedure: TRANSURETHRAL RESECTION OF BLADDER TUMOR (TURBT);  Surgeon: Franchot Gallo, MD;  Location[facility];  Service: Urology;  Laterality: N/A;  . TRANSURETHRAL RESECTION OF BLADDER TUMOR N/A 04/15/2016   Procedure: TRANSURETHRAL RESECTION OF BLADDER TUMOR;  Surgeon: Franchot Gallo, MD;  Location: [facility];  Service: Urology;  Laterality: N/A;  . TRANSURETHRAL RESECTION OF BLADDER TUMOR WITH GYRUS (TURBT-GYRUS) N/A 09/05/2013   Procedure: TRANSURETHRAL RESECTION OF BLADDER TUMOR WITH GYRUS (TURBT-GYRUS);  Surgeon: Fredricka Bonine, MD;  Location: [facility];  Service: Urology;  Laterality: N/A;  . TRANSURETHRAL RESECTION OF BLADDER TUMOR WITH GYRUS (TURBT-GYRUS) N/A 09/29/2015   Procedure: TRANSURETHRAL RESECTION OF BLADDER TUMOR WITH GYRUS (TURBT-GYRUS);  Surgeon: Franchot Gallo, MD;  Location: [facility];  Service: Urology;  Laterality: N/A;    Medications:  Prior to Admission:  Prescriptions Prior to Admission  Medication Sig Dispense Refill Last Dose  . diltiazem (CARDIZEM CD) 360 MG 24 hr capsule take 1 capsule by mouth once daily 90 capsule 3 Past Week at Unknown time  . furosemide (LASIX) 20 MG tablet take 1 tablet by mouth once daily (Patient taking differently: take 1 tablet by mouth once a week on Sunday) 90 tablet 2 Past Week at Unknown time  . iron polysaccharides (NIFEREX) 150 MG capsule Take 150 mg by mouth 3 (three) times a week.   Past Week at Unknown time  . nystatin-triamcinolone ointment (MYCOLOG) apply to affected area twice a day if needed for itching  0 Past Week at Unknown time  . potassium chloride (K-DUR) 10 MEQ tablet take 1 tablet by mouth once daily (Patient taking differently: take 1 tablet by mouth once a week on Sunday) 90 tablet 2 Past Week at Unknown time   Scheduled: . chlorhexidine  15 mL Mouth Rinse BID  . diatrizoate meglumine-sodium  90 mL Per NG tube Once  . heparin  5,000  Units Subcutaneous Q8H  . lidocaine  1 application Topical Once  . mouth rinse  15 mL Mouth Rinse q12n4p  . sodium chloride flush  3 mL Intravenous Q12H   Continuous: . sodium chloride 75 mL/hr at 09/12/16 0800  . diltiazem (CARDIZEM) infusion 15 mg/hr (09/12/16 0700)    Allergies:  Allergies  Allergen Reactions  . Pindolol Nausea Only and Swelling    Throat swells  . Iodine Other (See Comments)     TOPICAL IODINE///BLISTERS  . Shellfish Allergy Diarrhea and Nausea And Vomiting  . Vibramycin [Doxycycline Calcium] Nausea Only and Other (See Comments)    dizziness  . Doxycycline Nausea Only  . Levofloxacin Other (See Comments)    DIZZINESS  . Multaq [Dronedarone] Other (See Comments)    CAUSED SEVERE BURNING GI TRACT  . Vicodin [Hydrocodone-Acetaminophen] Other (See Comments)    dizziness    Family History  Problem Relation Age of Onset  .  Heart disease Mother   . CAD Mother   . Stroke Sister   . Cancer Neg Hx   . Diabetes Neg Hx     Social History:  reports that she has never smoked. She has never used smokeless tobacco. She reports that she does not drink alcohol or use drugs.  Review of Systems (10 point): Pertinent items are noted in HPI. A comprehensive review of systems was negative except as noted above.  Physical Exam:  Vital signs in last 24 hours: Temp:  [97.5 F (36.4 C)-98.3 F (36.8 C)] 97.8 F (36.6 C) (02/18 0806) Pulse Rate:  [94-121] 99 (02/18 0800) Resp:  [14-23] 19 (02/18 0800) BP: (109-146)/(67-82) 138/69 (02/18 0800) SpO2:  [95 %-100 %] 98 % (02/18 0800) Weight:  [110 lb 10.7 oz (50.2 kg)] 110 lb 10.7 oz (50.2 kg) (02/17 1435) General appearance: alert and appears stated age Head: Normocephalic, without obvious abnormality, atraumatic Eyes: conjunctivae/corneas clear. EOM's intact.  Oropharynx: moist mucous membranes Neck: supple, symmetrical, trachea midline Resp: normal respiratory effort Cardio: regular rate and rhythm Back:  symmetric, no curvature. ROM normal. No CVA tenderness. GI: soft, non-tender; bowel sounds normal; no masses,  no organomegaly. Colostomy present in the left lower quadrant with no stool in the bag. Extremities: extremities normal, atraumatic, no cyanosis or edema Skin: Skin color normal. No visible rashes or lesions Neurologic: Grossly normal  Laboratory Data:   Recent Labs  09/11/16 0827 09/12/16 0327  WBC 15.9* 11.9*  HGB 11.7* 10.7*  HCT 35.6* 32.7*   BMET  Recent Labs  09/11/16 0827 09/12/16 0327  NA 130* 134*  K 4.5 3.9  CL 94* 100*  CO2 28 26  GLUCOSE 174* 114*  BUN 19 17  CREATININE 1.74* 1.46*  CALCIUM 9.2 8.3*   No results for input(s): LABPT, INR in the last 72 hours. No results for input(s): LABURIN in the last 72 hours. Results for orders placed or performed during the hospital encounter of 09/11/16  MRSA PCR Screening     Status: None   Collection Time: 09/11/16  2:30 PM  Result Value Ref Range Status   MRSA by PCR NEGATIVE NEGATIVE Final    Comment:        The GeneXpert MRSA Assay (FDA approved for NASAL specimens only), is one component of a comprehensive MRSA colonization surveillance program. It is not intended to diagnose MRSA infection nor to guide or monitor treatment for MRSA infections.    Creatinine:  Recent Labs  09/11/16 0827 09/12/16 0327  CREATININE 1.74* 1.46*    Imaging: Ct Abdomen Pelvis Wo Contrast  Result Date: 09/11/2016 CLINICAL DATA:  Abdominal pain, nausea, and vomiting, decreased ostomy output since Thursday, suspected bowel obstruction, history of ovarian cancer 2013, bladder cancer 2015, paroxysmal atrial flutter EXAM: CT ABDOMEN AND PELVIS WITHOUT CONTRAST TECHNIQUE: Multidetector CT imaging of the abdomen and pelvis was performed following the standard protocol without IV contrast. Sagittal and coronal MPR images reconstructed from axial data set. No oral contrast was administered. COMPARISON:  10/04/2015  FINDINGS: Lower chest: Bibasilar atelectasis greater on RIGHT Hepatobiliary: Gallbladder unremarkable. No definite focal liver lesion. Pancreas: Atrophic without mass Spleen: Normal appearance Adrenals/Urinary Tract: Adrenal glands normal appearance. Tiny nonobstructing calculus LEFT kidney. LEFT kidney in ureter otherwise normal appearance. Marked RIGHT hydronephrosis and hydroureter since previous exam. Tiny nonobstructing calculus within the RIGHT renal pelvis. RIGHT ureteral dilatation terminates at a large soft tissue mass in the RIGHT pelvis compatible with neoplasm, likely representing significant interval in growth with  of the partially necrotic tumor mass identified in the RIGHT pelvis on the previous exam now measuring 11.5 x 9.6 x 9.5 cm, likely arising from the RIGHT lateral aspect of the bladder. Air is seen within urinary bladder. Additional foci of air seen within the mass likely representing necrosis/cavitation. The mass is contiguous with multiple pelvic bowel loops. Stomach/Bowel: Stomach partially distended. Dilated proximal small bowel with decompressed distal small bowel compatible small bowel obstruction. Point of transition is not clearly delineated but appears to be in the pelvis question related to adhesion or observed RIGHT pelvic tumor. Colon appears decompressed. Scattered colonic diverticulosis. LEFT lower quadrant colostomy. Parastomal herniation of a segment of transverse colon without colonic obstruction/dilatation. Vascular/Lymphatic: Atherosclerotic calcifications without aneurysm. Pacemaker leads RIGHT atrium and RIGHT ventricle. No definite adenopathy. Reproductive: Uterus surgically absent by history. Ovaries not visualized. Other: Scattered free fluid.  No definite free air. Musculoskeletal: Osseous demineralization with mild scoliosis and diffuse degenerative disc disease changes of the lumbar spine IMPRESSION: Large Ashley necrotic tumor mass in RIGHT pelvis 11.5 x 9.6 x 9.5  cm, causing obstruction of the RIGHT ureter and significant RIGHT hydroureteronephrosis. Tumor is likely arising from the RIGHT lateral aspect of the urinary bladder, which appears compressed and encompassed by the mass. Mid small bowel obstruction, favor due to either to adhesions or tumor in pelvis. Diffuse colonic diverticulosis with parastomal herniation of a segment of transverse colon adjacent to the colostomy LEFT lower quadrant without evidence of colonic obstruction. Electronically Signed   By: Lavonia Dana M.D.   On: 09/11/2016 10:18   Dg Chest Portable 1 View  Result Date: 09/11/2016 CLINICAL DATA:  Pain nausea and vomiting. EXAM: PORTABLE CHEST 1 VIEW COMPARISON:  Chest CT 03/10/2016.  Chest x-ray 03/23/2014 FINDINGS: The cardio pericardial silhouette is enlarged. Interstitial markings are diffusely coarsened with chronic features. Patchy airspace disease infrahilar left lung base. Stable scarring at the right base. The NG tube passes into the stomach although the distal tip position is not included on the film. Bones are diffusely demineralized. Telemetry leads overlie the chest. IMPRESSION: Cardiomegaly with underlying chronic interstitial changes. Left base atelectasis or infiltrate. Electronically Signed   By: Misty Stanley M.D.   On: 09/11/2016 14:37    CT scan images were independently reviewed as noted above.   Blanch Stang C 09/12/2016, 9:07 AM

## 2016-09-12 NOTE — Progress Notes (Signed)
Subjective: Feeling quite a bit better today.  Still no ostomy output.  Pain improved.    Objective: Vital signs in last 24 hours: Temp:  [97.5 F (36.4 C)-98.3 F (36.8 C)] 97.8 F (36.6 C) (02/18 0806) Pulse Rate:  [94-121] 100 (02/18 0700) Resp:  [14-23] 17 (02/18 0700) BP: (102-146)/(67-82) 116/71 (02/18 0600) SpO2:  [95 %-100 %] 97 % (02/18 0600) Weight:  [50.2 kg (110 lb 10.7 oz)] 50.2 kg (110 lb 10.7 oz) (02/17 1435) Last BM Date:  (non since admission)  Intake/Output from previous day: 02/17 0701 - 02/18 0700 In: 3374.6 [I.V.:2374.6; IV Piggyback:1000] Out: 570 [Urine:500; Emesis/NG output:70] Intake/Output this shift: No intake/output data recorded.  General appearance: alert, cooperative and no distress Resp: breathing comfortably GI: soft, non distended.  ? trace flatus in ostomy.  Scant serous fluid in bag.  soft, non tender, parastomal hernia.  Lab Results:   Recent Labs  09/11/16 0827 09/12/16 0327  WBC 15.9* 11.9*  HGB 11.7* 10.7*  HCT 35.6* 32.7*  PLT 318 315   BMET  Recent Labs  09/11/16 0827 09/12/16 0327  NA 130* 134*  K 4.5 3.9  CL 94* 100*  CO2 28 26  GLUCOSE 174* 114*  BUN 19 17  CREATININE 1.74* 1.46*  CALCIUM 9.2 8.3*   PT/INR No results for input(s): LABPROT, INR in the last 72 hours. ABG No results for input(s): PHART, HCO3 in the last 72 hours.  Invalid input(s): PCO2, PO2  Studies/Results: Ct Abdomen Pelvis Wo Contrast  Result Date: 09/11/2016 CLINICAL DATA:  Abdominal pain, nausea, and vomiting, decreased ostomy output since Thursday, suspected bowel obstruction, history of ovarian cancer 2013, bladder cancer 2015, paroxysmal atrial flutter EXAM: CT ABDOMEN AND PELVIS WITHOUT CONTRAST TECHNIQUE: Multidetector CT imaging of the abdomen and pelvis was performed following the standard protocol without IV contrast. Sagittal and coronal MPR images reconstructed from axial data set. No oral contrast was administered.  COMPARISON:  10/04/2015 FINDINGS: Lower chest: Bibasilar atelectasis greater on RIGHT Hepatobiliary: Gallbladder unremarkable. No definite focal liver lesion. Pancreas: Atrophic without mass Spleen: Normal appearance Adrenals/Urinary Tract: Adrenal glands normal appearance. Tiny nonobstructing calculus LEFT kidney. LEFT kidney in ureter otherwise normal appearance. Marked RIGHT hydronephrosis and hydroureter since previous exam. Tiny nonobstructing calculus within the RIGHT renal pelvis. RIGHT ureteral dilatation terminates at a large soft tissue mass in the RIGHT pelvis compatible with neoplasm, likely representing significant interval in growth with of the partially necrotic tumor mass identified in the RIGHT pelvis on the previous exam now measuring 11.5 x 9.6 x 9.5 cm, likely arising from the RIGHT lateral aspect of the bladder. Air is seen within urinary bladder. Additional foci of air seen within the mass likely representing necrosis/cavitation. The mass is contiguous with multiple pelvic bowel loops. Stomach/Bowel: Stomach partially distended. Dilated proximal small bowel with decompressed distal small bowel compatible small bowel obstruction. Point of transition is not clearly delineated but appears to be in the pelvis question related to adhesion or observed RIGHT pelvic tumor. Colon appears decompressed. Scattered colonic diverticulosis. LEFT lower quadrant colostomy. Parastomal herniation of a segment of transverse colon without colonic obstruction/dilatation. Vascular/Lymphatic: Atherosclerotic calcifications without aneurysm. Pacemaker leads RIGHT atrium and RIGHT ventricle. No definite adenopathy. Reproductive: Uterus surgically absent by history. Ovaries not visualized. Other: Scattered free fluid.  No definite free air. Musculoskeletal: Osseous demineralization with mild scoliosis and diffuse degenerative disc disease changes of the lumbar spine IMPRESSION: Large Ashley necrotic tumor mass in RIGHT  pelvis 11.5 x 9.6 x 9.5 cm,  causing obstruction of the RIGHT ureter and significant RIGHT hydroureteronephrosis. Tumor is likely arising from the RIGHT lateral aspect of the urinary bladder, which appears compressed and encompassed by the mass. Mid small bowel obstruction, favor due to either to adhesions or tumor in pelvis. Diffuse colonic diverticulosis with parastomal herniation of a segment of transverse colon adjacent to the colostomy LEFT lower quadrant without evidence of colonic obstruction. Electronically Signed   By: Lavonia Dana M.D.   On: 09/11/2016 10:18   Dg Chest Portable 1 View  Result Date: 09/11/2016 CLINICAL DATA:  Pain nausea and vomiting. EXAM: PORTABLE CHEST 1 VIEW COMPARISON:  Chest CT 03/10/2016.  Chest x-ray 03/23/2014 FINDINGS: The cardio pericardial silhouette is enlarged. Interstitial markings are diffusely coarsened with chronic features. Patchy airspace disease infrahilar left lung base. Stable scarring at the right base. The NG tube passes into the stomach although the distal tip position is not included on the film. Bones are diffusely demineralized. Telemetry leads overlie the chest. IMPRESSION: Cardiomegaly with underlying chronic interstitial changes. Left base atelectasis or infiltrate. Electronically Signed   By: Misty Stanley M.D.   On: 09/11/2016 14:37    Anti-infectives: Anti-infectives    None      Assessment/Plan: SBO, likely secondary to adhesions in setting of recurrent ovarian cancer with pelvic tumor Small bowel protocol today.  Consider TNA. Consider palliative care.     LOS: 1 day    [facility] 09/12/2016

## 2016-09-13 ENCOUNTER — Inpatient Hospital Stay (HOSPITAL_COMMUNITY): Payer: Medicare Other

## 2016-09-13 DIAGNOSIS — C7911 Secondary malignant neoplasm of bladder: Principal | ICD-10-CM

## 2016-09-13 DIAGNOSIS — C569 Malignant neoplasm of unspecified ovary: Secondary | ICD-10-CM

## 2016-09-13 DIAGNOSIS — E871 Hypo-osmolality and hyponatremia: Secondary | ICD-10-CM

## 2016-09-13 DIAGNOSIS — Z933 Colostomy status: Secondary | ICD-10-CM

## 2016-09-13 DIAGNOSIS — C561 Malignant neoplasm of right ovary: Secondary | ICD-10-CM

## 2016-09-13 DIAGNOSIS — I4891 Unspecified atrial fibrillation: Secondary | ICD-10-CM

## 2016-09-13 LAB — COMPREHENSIVE METABOLIC PANEL
ALBUMIN: 2.2 g/dL — AB (ref 3.5–5.0)
ALK PHOS: 64 U/L (ref 38–126)
ALT: 8 U/L — ABNORMAL LOW (ref 14–54)
ANION GAP: 9 (ref 5–15)
AST: 16 U/L (ref 15–41)
BILIRUBIN TOTAL: 0.9 mg/dL (ref 0.3–1.2)
BUN: 16 mg/dL (ref 6–20)
CALCIUM: 8.3 mg/dL — AB (ref 8.9–10.3)
CO2: 22 mmol/L (ref 22–32)
Chloride: 104 mmol/L (ref 101–111)
Creatinine, Ser: 1.3 mg/dL — ABNORMAL HIGH (ref 0.44–1.00)
GFR calc Af Amer: 42 mL/min — ABNORMAL LOW (ref >60)
GFR, EST NON AFRICAN AMERICAN: 36 mL/min — AB (ref >60)
Glucose, Bld: 84 mg/dL (ref 65–99)
Potassium: 3.7 mmol/L (ref 3.5–5.1)
Sodium: 135 mmol/L (ref 135–145)
TOTAL PROTEIN: 5.6 g/dL — AB (ref 6.5–8.1)

## 2016-09-13 LAB — MAGNESIUM: MAGNESIUM: 1.5 mg/dL — AB (ref 1.7–2.4)

## 2016-09-13 MED ORDER — METOPROLOL TARTRATE 5 MG/5ML IV SOLN
5.0000 mg | Freq: Four times a day (QID) | INTRAVENOUS | Status: DC | PRN
  Filled 2016-09-13: qty 5

## 2016-09-13 NOTE — Progress Notes (Signed)
Cardizem drip is 17.5cc/hr for 15 mins now and bp is 114/58 and HR manually checked by 2 RN's is 100 to 112 however tele monitor continues to report hr in 130's 140's mostly.  Will continue to monitor closely at bedside and information all given to CTM.

## 2016-09-13 NOTE — Progress Notes (Signed)
BP is currently 102/56 and HR is 105 MANUALLY although tele continues to report HR 120's to at times as high as 150.  Cardizem is at 17.5cc/hr.

## 2016-09-13 NOTE — Evaluation (Signed)
Physical Therapy Evaluation Patient Details Name: Victoria [name] MRN: [id number] DOB: 23-Aug-1926 Today's Date: 09/13/2016   History of Present Illness  83 yo female admitted with SBO, A fib with RVR.   Clinical Impression  On eval, pt required Min assist for mobility. Pt was able to perform a stand pivot, recliner to bed, with a straight cane. Pt declined ambulation on today. Explained role of PT and the need for progressing activity. Will follow. Recommend HHPT follow up if pt doesn't return home with hospice.     Follow Up Recommendations Home health PT;Supervision/Assistance - 24 hour (if pt doesn't return home with hospice)    Equipment Recommendations   (continuing to assess)    Recommendations for Other Services       Precautions / Restrictions Precautions Precautions: Fall Precaution Comments: NG tube; multiple lines Restrictions Weight Bearing Restrictions: No      Mobility  Bed Mobility Overal bed mobility: Needs Assistance Bed Mobility: Sit to Supine       Sit to supine: Min guard   General bed mobility comments: close guard for safety.   Transfers Overall transfer level: Needs assistance Equipment used: Straight cane Transfers: Sit to/from [facility] Sit to Stand: Min assist Stand pivot transfers: Min assist       General transfer comment: Assist to rise, stabilize, control descent, manage lines. Cues for safety. Stand pivot, recliner to bed, with a cane  Ambulation/Gait             General Gait Details: NT-pt declined ambulation  Stairs            Wheelchair Mobility    Modified Rankin (Stroke Patients Only)       Balance                                             Pertinent Vitals/Pain Pain Assessment: No/denies pain    Home Living Family/patient expects to be discharged to:: Private residence Living Arrangements: Spouse/significant other   Type of Home: House Home Access: Stairs to  enter Entrance Stairs-Rails: Left Entrance Stairs-Number of Steps: 3 Home Layout: One level Home Equipment: Cane - single point      Prior Function Level of Independence: Independent with assistive device(s)               Hand Dominance        Extremity/Trunk Assessment   Upper Extremity Assessment Upper Extremity Assessment: Generalized weakness    Lower Extremity Assessment Lower Extremity Assessment: Generalized weakness    Cervical / Trunk Assessment Cervical / Trunk Assessment: Kyphotic  Communication   Communication: No difficulties  Cognition Arousal/Alertness: Awake/alert Behavior During Therapy: WFL for tasks assessed/performed Overall Cognitive Status: Within Functional Limits for tasks assessed                      General Comments      Exercises     Assessment/Plan    PT Assessment Patient needs continued PT services  PT Problem List Decreased strength;Decreased activity tolerance;Decreased balance;Decreased knowledge of use of DME       PT Treatment Interventions DME instruction;Gait training;Therapeutic exercise;Therapeutic activities;Patient/family education;Functional mobility training    PT Goals (Current goals can be found in the Care Plan section)  Acute Rehab PT Goals Patient Stated Goal: to get better PT Goal Formulation: With patient Time For Goal  Achievement: 09/27/16 Potential to Achieve Goals: Fair    Frequency Min 3X/week   Barriers to discharge        Co-evaluation               End of Session   Activity Tolerance: Patient tolerated treatment well Patient left: in bed;with call bell/phone within reach;with family/visitor present;with bed alarm set   PT Visit Diagnosis: Muscle weakness (generalized) (M62.81);Difficulty in walking, not elsewhere classified (R26.2)         Time: [id number] PT Time Calculation (min) (ACUTE ONLY): 13 min   Charges:   PT Evaluation $PT Eval Low Complexity: 1  Procedure     PT G Codes:         Weston Anna, MPT Pager: [phone]

## 2016-09-13 NOTE — Progress Notes (Signed)
[facility] Office:  [phone] General Surgery Progress Note   LOS: 2 days  POD -     Assessment/Plan:  1. SBO ` Not any better clinically or on KUB  This is a difficult situation.  I spoke to her and her daughter in law, Bandy Torrens, and she needs palliative care to see.  I don't think that surgery will provide a sustainable improvement in her situation.  2. Metastatic ovarian cancer  She was follow by Dr. Benay Spice - but she has not seen him since last fall. 3. Right Ureteral Obstruction  Followed by Dr. Diona Fanti  No plan for surgery at this time. 4. Colostomy  partial sigmoid colectomy with end colostomy 2013.  Nancy Marus and Serita Grammes did original surgery. She had a prolapsed ostomy and had this revised in 2014  She had an enterolysis by Dr. Marcello Moores - 10/04/2015. 5. DVT prophylaxis - SubQ Heparin  6. A.fib/flutter 7.  Has pacemaker - followed by Dr. 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  Has NGT.  Not sore right now.  Daughter in law, , Luise Yowell, in room with patient.  Objective:   Vitals:   09/13/16 0627 09/13/16 0820  BP: (!) 102/56 (!) 100/59  Pulse: (!) 105   Resp: 16   Temp: 98.1 F (36.7 C)      Intake/Output from previous day:  02/18 0701 - 02/19 0700 In: [id number] [I.V.:1306; NG/GT:120] Out: 1525 [Urine:950; Emesis/NG output:575]  Intake/Output this shift:  No intake/output data recorded.   Physical Exam:   General: thin older WF who is alert  and oriented.    HEENT: Normal. Pupils equal. .   Lungs: clear   Abdomen: distended, but not tender.  Has LUQ ostomy with hernia.  Has small hernias in midline incision.  Has RLQ mass.   Lab Results:    Recent Labs  09/12/16 0327 09/12/16 1644  WBC 11.9* 13.7*  HGB 10.7* 11.0*  HCT 32.7* 33.7*  PLT 315 289    BMET   Recent Labs  09/12/16 0327 09/13/16 0530  NA 134* 135  K 3.9 3.7  CL 100* 104  CO2 26 22  GLUCOSE 114* 84  BUN 17 16  CREATININE 1.46* 1.30*  CALCIUM 8.3* 8.3*    PT/INR  No results for input(s): LABPROT, INR in the last 72 hours.  ABG  No results for input(s): PHART, HCO3 in the last 72 hours.  Invalid input(s): PCO2, PO2   Studies/Results:  Ct Abdomen Pelvis Wo Contrast  Addendum Date: 09/12/2016   ADDENDUM REPORT: 09/12/2016 09:43 ADDENDUM: Voice recognition error in impression. First sentence of the IMPRESSION should be corrected to state: Large PARTIALLY necrotic tumor mass in RIGHT pelvis 11.5 x 9.6 x 9.5 cm, causing obstruction of the RIGHT ureter and significant RIGHT hydroureteronephrosis. Remainder of impression as previously dictated. Electronically Signed   By: Lavonia Dana M.D.   On: 09/12/2016 09:43   Result Date: 09/12/2016 CLINICAL DATA:  Abdominal  pain, nausea, and vomiting, decreased ostomy output since Thursday, suspected bowel obstruction, history of ovarian cancer 2013, bladder cancer 2015, paroxysmal atrial flutter EXAM: CT ABDOMEN AND PELVIS WITHOUT CONTRAST TECHNIQUE: Multidetector CT imaging of the abdomen and pelvis was performed following the standard protocol without IV contrast. Sagittal and coronal MPR images reconstructed from axial data set. No oral contrast was administered. COMPARISON:  10/04/2015 FINDINGS: Lower chest: Bibasilar atelectasis greater on RIGHT Hepatobiliary: Gallbladder unremarkable. No definite focal liver lesion. Pancreas: Atrophic without mass Spleen: Normal appearance Adrenals/Urinary Tract: Adrenal glands  normal appearance. Tiny nonobstructing calculus LEFT kidney. LEFT kidney in ureter otherwise normal appearance. Marked RIGHT hydronephrosis and hydroureter since previous exam. Tiny nonobstructing calculus within the RIGHT renal pelvis. RIGHT ureteral dilatation terminates at a large soft tissue mass in the RIGHT pelvis compatible with neoplasm, likely representing significant interval in growth with of the partially necrotic tumor mass identified in the RIGHT pelvis on the previous exam now measuring 11.5 x 9.6 x 9.5 cm, likely arising from the RIGHT lateral aspect of the bladder. Air is seen within urinary bladder. Additional foci of air seen within the mass likely representing necrosis/cavitation. The mass is contiguous with multiple pelvic bowel loops. Stomach/Bowel: Stomach partially distended. Dilated proximal small bowel with decompressed distal small bowel compatible small bowel obstruction. Point of transition is not clearly delineated but appears to be in the pelvis question related to adhesion or observed RIGHT pelvic tumor. Colon appears decompressed. Scattered colonic diverticulosis. LEFT lower quadrant colostomy. Parastomal herniation of a segment of transverse colon without colonic obstruction/dilatation. Vascular/Lymphatic: Atherosclerotic calcifications without aneurysm. Pacemaker leads RIGHT atrium and RIGHT ventricle. No definite adenopathy. Reproductive: Uterus surgically absent by history. Ovaries not visualized. Other: Scattered free fluid.  No definite free air. Musculoskeletal: Osseous demineralization with mild scoliosis and diffuse degenerative disc disease changes of the lumbar spine IMPRESSION: Large Ashley necrotic tumor mass in RIGHT pelvis 11.5 x 9.6 x 9.5 cm, causing obstruction of the RIGHT ureter and significant RIGHT hydroureteronephrosis. Tumor is likely arising from the RIGHT lateral aspect of the urinary bladder, which appears compressed and encompassed by the mass. Mid small  bowel obstruction, favor due to either to adhesions or tumor in pelvis. Diffuse colonic diverticulosis with parastomal herniation of a segment of transverse colon adjacent to the colostomy LEFT lower quadrant without evidence of colonic obstruction. Electronically Signed: By: Lavonia Dana M.D. On: 09/11/2016 10:18   Dg Chest Portable 1 View  Result Date: 09/11/2016 CLINICAL DATA:  Pain nausea and vomiting. EXAM: PORTABLE CHEST 1 VIEW COMPARISON:  Chest CT 03/10/2016.  Chest x-ray 03/23/2014 FINDINGS: The cardio pericardial silhouette is enlarged. Interstitial markings are diffusely coarsened with chronic features. Patchy airspace disease infrahilar left lung base. Stable scarring at the right base. The NG tube passes into the stomach although the distal tip position is not included on the film. Bones are diffusely demineralized. Telemetry leads overlie the chest. IMPRESSION: Cardiomegaly with underlying chronic interstitial changes. Left base atelectasis or infiltrate. Electronically Signed   By: Misty Stanley M.D.   On: 09/11/2016 14:37   Dg Abd Portable 1v-small Bowel Obstruction Protocol-initial, 8 Hr Delay  Result Date: 09/12/2016 CLINICAL DATA:  Patient status post contrast. EXAM: PORTABLE ABDOMEN - 1 VIEW COMPARISON:  Abdominal radiograph earlier same day. FINDINGS: Enteric tube tip and side-port project over the stomach. Monitoring leads overlie the patient. Oral contrast material is demonstrated within the stomach. Persistent gaseous distended loops of small bowel within the central abdomen. Lumbar spine degenerative changes. IMPRESSION:  Persistent small bowel obstruction. Oral contrast material in the stomach. Electronically Signed   By: Lovey Newcomer M.D.   On: 09/12/2016 18:50   Dg Abd Portable 1v-small Bowel Protocol-position Verification  Result Date: 09/12/2016 CLINICAL DATA:  Nasogastric tube placement for small bowel obstruction EXAM: PORTABLE ABDOMEN - 1 VIEW COMPARISON:  Portable exam 0910  hours compared to CT abdomen and pelvis 09/11/2016 FINDINGS: Tip of nasogastric tube projects over stomach. Dilated small bowel loops compatible with small bowel obstruction No definite bowel wall thickening. Bones demineralized with degenerative changes and scoliosis lumbar spine. Aortic atherosclerosis. IMPRESSION: Dilated small bowel loops with paucity of colonic gas consistent with small bowel obstruction. Nasogastric tube projects over stomach. Electronically Signed   By: Lavonia Dana M.D.   On: 09/12/2016 09:40     Anti-infectives:   Anti-infectives    None      Alphonsa Overall, MD, FACS Pager: [phone] Surgery Office: [phone] 09/13/2016

## 2016-09-13 NOTE — Progress Notes (Signed)
Subjective: Still very distended, BS are hyperactive, NG is working well, not much drainage.  Nothing in the  Ostomy pouch.   Objective: Vital signs in last 24 hours: Temp:  [97.7 F (36.5 C)-98.6 F (37 C)] 98.1 F (36.7 C) (02/19 0627) Pulse Rate:  [90-135] 105 (02/19 0627) Resp:  [15-18] 16 (02/19 0627) BP: (97-118)/(56-69) 100/59 (02/19 0820) SpO2:  [92 %-97 %] 92 % (02/19 0627) Last BM Date: (P)  (no gas or output in colostomy x slight clear/amber liquid) NPO 1400 IV Urine 950 NG 575 No BM  Afebrile, VSS CMP stable  SBP 8 hour film: Persistent small bowel obstruction. Oral contrast material in the Stomach. Film this AM:  Persistent gaseous distended small bowel loops mid and lower abdomen consistent with small bowel obstruction. NG tube is unchanged in position with tip in mid stomach. Paucity of bowel gas within colon.  Intake/Output from previous day: 02/18 0701 - 02/19 0700 In: 1546 [I.V.:1306; NG/GT:120] Out: 1525 [Urine:950; Emesis/NG output:575] Intake/Output this shift: No intake/output data recorded.  General appearance: alert, cooperative, no distress and tolerating NG well, without much complaint. GI: distended and tight, but not overly tender.  BS hyperactive, No flauts or fluid in ostomy bag.  Malnourished you can feel sutures midline incisions   Lab Results:   Recent Labs  09/12/16 0327 09/12/16 1644  WBC 11.9* 13.7*  HGB 10.7* 11.0*  HCT 32.7* 33.7*  PLT 315 289    BMET  Recent Labs  09/12/16 0327 09/13/16 0530  NA 134* 135  K 3.9 3.7  CL 100* 104  CO2 26 22  GLUCOSE 114* 84  BUN 17 16  CREATININE 1.46* 1.30*  CALCIUM 8.3* 8.3*   PT/INR No results for input(s): LABPROT, INR in the last 72 hours.   Recent Labs Lab 09/11/16 0827 09/13/16 0530  AST 20 16  ALT 9* 8*  ALKPHOS 82 64  BILITOT 0.9 0.9  PROT 6.9 5.6*  ALBUMIN 2.9* 2.2*     Lipase     Component Value Date/Time   LIPASE 14 09/11/2016 0827      Studies/Results: Ct Abdomen Pelvis Wo Contrast  Addendum Date: 09/12/2016   ADDENDUM REPORT: 09/12/2016 09:43 ADDENDUM: Voice recognition error in impression. First sentence of the IMPRESSION should be corrected to state: Large PARTIALLY necrotic tumor mass in RIGHT pelvis 11.5 x 9.6 x 9.5 cm, causing obstruction of the RIGHT ureter and significant RIGHT hydroureteronephrosis. Remainder of impression as previously dictated. Electronically Signed   By: Lavonia Dana M.D.   On: 09/12/2016 09:43   Result Date: 09/12/2016 CLINICAL DATA:  Abdominal pain, nausea, and vomiting, decreased ostomy output since Thursday, suspected bowel obstruction, history of ovarian cancer 2013, bladder cancer 2015, paroxysmal atrial flutter EXAM: CT ABDOMEN AND PELVIS WITHOUT CONTRAST TECHNIQUE: Multidetector CT imaging of the abdomen and pelvis was performed following the standard protocol without IV contrast. Sagittal and coronal MPR images reconstructed from axial data set. No oral contrast was administered. COMPARISON:  10/04/2015 FINDINGS: Lower chest: Bibasilar atelectasis greater on RIGHT Hepatobiliary: Gallbladder unremarkable. No definite focal liver lesion. Pancreas: Atrophic without mass Spleen: Normal appearance Adrenals/Urinary Tract: Adrenal glands normal appearance. Tiny nonobstructing calculus LEFT kidney. LEFT kidney in ureter otherwise normal appearance. Marked RIGHT hydronephrosis and hydroureter since previous exam. Tiny nonobstructing calculus within the RIGHT renal pelvis. RIGHT ureteral dilatation terminates at a large soft tissue mass in the RIGHT pelvis compatible with neoplasm, likely representing significant interval in growth with of the partially necrotic tumor mass identified  in the RIGHT pelvis on the previous exam now measuring 11.5 x 9.6 x 9.5 cm, likely arising from the RIGHT lateral aspect of the bladder. Air is seen within urinary bladder. Additional foci of air seen within the mass likely  representing necrosis/cavitation. The mass is contiguous with multiple pelvic bowel loops. Stomach/Bowel: Stomach partially distended. Dilated proximal small bowel with decompressed distal small bowel compatible small bowel obstruction. Point of transition is not clearly delineated but appears to be in the pelvis question related to adhesion or observed RIGHT pelvic tumor. Colon appears decompressed. Scattered colonic diverticulosis. LEFT lower quadrant colostomy. Parastomal herniation of a segment of transverse colon without colonic obstruction/dilatation. Vascular/Lymphatic: Atherosclerotic calcifications without aneurysm. Pacemaker leads RIGHT atrium and RIGHT ventricle. No definite adenopathy. Reproductive: Uterus surgically absent by history. Ovaries not visualized. Other: Scattered free fluid.  No definite free air. Musculoskeletal: Osseous demineralization with mild scoliosis and diffuse degenerative disc disease changes of the lumbar spine IMPRESSION: Large Ashley necrotic tumor mass in RIGHT pelvis 11.5 x 9.6 x 9.5 cm, causing obstruction of the RIGHT ureter and significant RIGHT hydroureteronephrosis. Tumor is likely arising from the RIGHT lateral aspect of the urinary bladder, which appears compressed and encompassed by the mass. Mid small bowel obstruction, favor due to either to adhesions or tumor in pelvis. Diffuse colonic diverticulosis with parastomal herniation of a segment of transverse colon adjacent to the colostomy LEFT lower quadrant without evidence of colonic obstruction. Electronically Signed: By: Lavonia Dana M.D. On: 09/11/2016 10:18   Dg Chest Portable 1 View  Result Date: 09/11/2016 CLINICAL DATA:  Pain nausea and vomiting. EXAM: PORTABLE CHEST 1 VIEW COMPARISON:  Chest CT 03/10/2016.  Chest x-ray 03/23/2014 FINDINGS: The cardio pericardial silhouette is enlarged. Interstitial markings are diffusely coarsened with chronic features. Patchy airspace disease infrahilar left lung base.  Stable scarring at the right base. The NG tube passes into the stomach although the distal tip position is not included on the film. Bones are diffusely demineralized. Telemetry leads overlie the chest. IMPRESSION: Cardiomegaly with underlying chronic interstitial changes. Left base atelectasis or infiltrate. Electronically Signed   By: Misty Stanley M.D.   On: 09/11/2016 14:37   Dg Abd Portable 1v  Result Date: 09/13/2016 CLINICAL DATA:  Small bowel obstruction EXAM: PORTABLE ABDOMEN - 1 VIEW COMPARISON:  09/12/2016 FINDINGS: Persistent gaseous distended small bowel loops mid and lower abdomen consistent with small bowel obstruction. NG tube is unchanged in position with tip in mid stomach. Paucity of bowel gas within colon. IMPRESSION: Persistent small bowel obstruction.  Stable NG tube position. Electronically Signed   By: Lahoma Crocker M.D.   On: 09/13/2016 08:48   Dg Abd Portable 1v-small Bowel Obstruction Protocol-initial, 8 Hr Delay  Result Date: 09/12/2016 CLINICAL DATA:  Patient status post contrast. EXAM: PORTABLE ABDOMEN - 1 VIEW COMPARISON:  Abdominal radiograph earlier same day. FINDINGS: Enteric tube tip and side-port project over the stomach. Monitoring leads overlie the patient. Oral contrast material is demonstrated within the stomach. Persistent gaseous distended loops of small bowel within the central abdomen. Lumbar spine degenerative changes. IMPRESSION: Persistent small bowel obstruction. Oral contrast material in the stomach. Electronically Signed   By: Lovey Newcomer M.D.   On: 09/12/2016 18:50   Dg Abd Portable 1v-small Bowel Protocol-position Verification  Result Date: 09/12/2016 CLINICAL DATA:  Nasogastric tube placement for small bowel obstruction EXAM: PORTABLE ABDOMEN - 1 VIEW COMPARISON:  Portable exam 0910 hours compared to CT abdomen and pelvis 09/11/2016 FINDINGS: Tip of nasogastric tube projects  over stomach. Dilated small bowel loops compatible with small bowel obstruction  No definite bowel wall thickening. Bones demineralized with degenerative changes and scoliosis lumbar spine. Aortic atherosclerosis. IMPRESSION: Dilated small bowel loops with paucity of colonic gas consistent with small bowel obstruction. Nasogastric tube projects over stomach. Electronically Signed   By: Lavonia Dana M.D.   On: 09/12/2016 09:40    Medications: . chlorhexidine  15 mL Mouth Rinse BID  . heparin  5,000 Units Subcutaneous Q8H  . lidocaine  1 application Topical Once  . mouth rinse  15 mL Mouth Rinse q12n4p  . sodium chloride flush  3 mL Intravenous Q12H   . sodium chloride 75 mL/hr (09/12/16 2234)  . diltiazem (CARDIZEM) infusion 17.5 mg/hr (09/13/16 0514)    Assessment/Plan SBO, Hx of recurrent ovarian cancer with pelvic tumor/8 prior surgeries Right hydronephrosis - followed by Urology, no stent planned currently Dr. Diona Fanti  PAF on Cardizem drip this AM SSS/RBBB PCM Acute kidney injuruy FEN:  IV fluids/NPO ID:  None DVT:  heparin   PLan:  Dr. Lucia Gaskins has seen her and given her ice chips and she is very appreciative. Continue NG suction/bowel rest and IV hydration.  Hope she opens up.      LOS: 2 days    JENNINGS,WILLARD 09/13/2016 [phone]  Agree with above. I have a complete note written today.  Alphonsa Overall, MD, [facility] Surgery Pager: [phone] Office phone:  [phone]

## 2016-09-13 NOTE — Progress Notes (Signed)
PROGRESS NOTE    Victoria [name]  [id number] DOB: [birth year] DOA: 09/11/2016 PCP: Donnajean Lopes, MD  Outpatient Specialists:   Dr Victorino Sparrow Dr. Diona Fanti Dr croituru  Brief Narrative:  77 ? Ov Ca diag as rectal mass 03/13/12-s/p lap + Jej-Jej bypass + loop colostomy 04/04/12 Sigmoidectomy-revision end ostomy 10/03/12  Invasion of urinary bladder at TURBT and Fulguration 09/02/13  Ex-lap lysis 10/09/15 for SBO 4 previous palliative resections--on 09/18/2103, 07/02/2014, 11/21/2014 and 10/13/2015 as well as palaitive XRT SSS-Afib CHad2Vasc2 score=4 s/p PPM st Jude implant 2010-Atrial paced  Admit with SBO, N/V/decreasing Ostomy OP. Rapid Afib Na 130, Creat 1.7 [basleine 14/0.9] Lactate 3.19. 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  Hopeful for resolution.  NG to wall suction out only 70 cc? --put out 220 to RN.  Gastrograffin axr testing  confirms persisting obstruction. Patient is interested in palliative care, does not wish further abdominal surgery and we will consult them non-emergently Lactic acidosis Acidosis 2/2 renal insufficiency and resolving 3.1--1.8 with IV fluids Sss/Afib CHad2Vasc2 score=4/ ppm implant 2010 IN RVR on admission-Continue IV cardizem-poor GI absortvity-keep in sdu for now.  Convert when more relaibly can take PO-there apparently is poor correlation between monitor and auscultated rhythm and she is actually only at heart rate of 84. Slightly  hypotensive so cutting back Cardizem from 17--15 and maybe 10 Ovarian Ca s/p Jej-Jej bypass/loop colostomy 03/2012 with Kidney invasion-Kindey function stabilizing.  Cont saline-do not think will ned per c nephrostomy placement if creat continues to resolve--> creatinine 1.7-1.4-1.3 Hyponatremia-hypovolemic-acute kidney injury, initial concern for obstructive uropathy on admission-now resolving.  Continuing NS 75 cc/h--1:30-->135  Primary UrologuistDahlstedt to see patient for further decision making re: perc drain/Double-J stents 2/19 Leukocytosis-afebrile, L base infiltrate? Rpt CXR am-hold abx, follow blood cult--thin if he has no cough no wheeze no other issues   Can transfer to tele on Cardizem-has been stbale Hopeful for resolution in 1-2 days-would not want surgery even if obstructed and seems like his interest in palliative care can have consulted Not ready for discharge currently D/w husband at bedsdie We will try to get evaluation with therapy  Consultants:   Gen surg  Urology  Procedures:     Antimicrobials:   none    Subjective:   Doing fair. Putting out some amount through the NG, nondistended but no gas in ostomy no stool Feeling nauseous No other concerns or complaints Has been not interested in surgery and overall feeling fair only  Objective: Vitals:   09/13/16 0414 09/13/16 0450 09/13/16 0627 09/13/16 0820  BP: 110/60  (!) 102/56 (!) 100/59  Pulse: (!) 116 90 (!) 105   Resp:   16   Temp:   98.1 F (36.7 C)   TempSrc:   Oral   SpO2:   92%   Weight:      Height:        Intake/Output Summary (Last 24 hours) at 09/13/16 [id number] Last data filed  at 09/13/16 0400  Gross per 24 hour  Intake          1546.04 ml  Output             1525 ml  Net            21.04 ml   Filed Weights   09/11/16 1435  Weight: 50.2 kg (110 lb 10.7 oz)    Examination:  General exam: Appears calm and comfortable , Slightly tired appearing Respiratory system: Clear to  auscultation.  Cardiovascular system: S1 & S2 heard, -manual heart rate checked was 84 Gastrointestinal system: Abdomen is slight distedned, Ostomy bag trickle of light green fluid, no stool Central nervous system: Alert and oriented. No focal neurological deficits. Extremities: Symmetric 5 x 5 power. Skin: No rashes, lesions or ulcers Psychiatry: Judgement and insight appear normal. Mood & affect appropriate.     Data Reviewed: I have personally reviewed following labs and imaging studies  CBC:  Recent Labs Lab 09/11/16 0827 09/12/16 0327 09/12/16 1644  WBC 15.9* 11.9* 13.7*  NEUTROABS 14.3*  --  12.7*  HGB 11.7* 10.7* 11.0*  HCT 35.6* 32.7* 33.7*  MCV 86.8 86.7 86.9  PLT 318 315 [id number]   Basic Metabolic Panel:  Recent Labs Lab 09/11/16 0827 09/12/16 0327 09/13/16 0530  NA 130* 134* 135  K 4.5 3.9 3.7  CL 94* 100* 104  CO2 28 26 22   GLUCOSE 174* 114* 84  BUN 19 17 16   CREATININE 1.74* 1.46* 1.30*  CALCIUM 9.2 8.3* 8.3*  MG  --   --  1.5*   GFR: Estimated Creatinine Clearance: 21.9 mL/min (by C-G formula based on SCr of 1.3 mg/dL (H)). Liver Function Tests:  Recent Labs Lab 09/11/16 0827 09/13/16 0530  AST 20 16  ALT 9* 8*  ALKPHOS 82 64  BILITOT 0.9 0.9  PROT 6.9 5.6*  ALBUMIN 2.9* 2.2*    Recent Labs Lab 09/11/16 0827  LIPASE 14   No results for input(s): AMMONIA in the last 168 hours. Coagulation Profile: No results for input(s): INR, PROTIME in the last 168 hours. Cardiac Enzymes: No results for input(s): CKTOTAL, CKMB, CKMBINDEX, TROPONINI in the last 168 hours. BNP (last 3 results) No results for input(s): PROBNP in the last 8760 hours. HbA1C: No results for input(s): HGBA1C in the last 72 hours. CBG:  Recent Labs Lab 09/11/16 2010  GLUCAP 115*   Lipid Profile: No results for input(s): CHOL, HDL, LDLCALC, TRIG, CHOLHDL, LDLDIRECT in the last 72 hours. Thyroid Function Tests: No results for input(s): TSH, T4TOTAL, FREET4, T3FREE,  THYROIDAB in the last 72 hours. Anemia Panel: No results for input(s): VITAMINB12, FOLATE, FERRITIN, TIBC, IRON, RETICCTPCT in the last 72 hours. Urine analysis:    Component Value Date/Time   COLORURINE YELLOW 09/11/2016 2035   APPEARANCEUR CLOUDY (A) 09/11/2016 2035   LABSPEC 1.012 09/11/2016 2035   LABSPEC 1.020 11/21/2015 1006   PHURINE 6.0 09/11/2016 2035   GLUCOSEU NEGATIVE 09/11/2016 2035   GLUCOSEU Negative 11/21/2015 1006   HGBUR MODERATE (A) 09/11/2016 2035   BILIRUBINUR NEGATIVE 09/11/2016 2035   BILIRUBINUR Negative 11/21/2015 [place] 09/11/2016 2035   PROTEINUR 100 (A) 09/11/2016 2035   UROBILINOGEN 0.2 11/21/2015 1006   NITRITE POSITIVE (A) 09/11/2016 2035   LEUKOCYTESUR LARGE (A) 09/11/2016 2035   LEUKOCYTESUR Moderate 11/21/2015 1006   Sepsis Labs: @LABRCNTIP (procalcitonin:4,lacticidven:4)  ) Recent Results (from the past 240 hour(s))  MRSA PCR Screening     Status: None  Collection Time: 09/11/16  2:30 PM  Result Value Ref Range Status   MRSA by PCR NEGATIVE NEGATIVE Final    Comment:        The GeneXpert MRSA Assay (FDA approved for NASAL specimens only), is one component of a comprehensive MRSA colonization surveillance program. It is not intended to diagnose MRSA infection nor to guide or monitor treatment for MRSA infections.   Culture, blood (Routine X 2) w Reflex to ID Panel     Status: None (Preliminary result)   Collection Time: 09/11/16  3:03 PM  Result Value Ref Range Status   Specimen Description BLOOD LEFT ANTECUBITAL  Final   Special Requests BOTTLES DRAWN AEROBIC AND ANAEROBIC 5CC  Final   Culture   Final    NO GROWTH < 24 HOURS Performed at [facility], [place] [address] [place], [place] [address]    Report Status PENDING  Incomplete  Culture, blood (Routine X 2) w Reflex to ID Panel     Status: None (Preliminary result)   Collection Time: 09/11/16  3:08 PM  Result Value Ref Range Status   Specimen  Description BLOOD RIGHT ARM  Final   Special Requests BOTTLES DRAWN AEROBIC AND ANAEROBIC 5CC  Final   Culture   Final    NO GROWTH < 24 HOURS Performed at [facility], [place] [address] [place], [place] [address]    Report Status PENDING  Incomplete         Radiology Studies: Ct Abdomen Pelvis Wo Contrast  Addendum Date: 09/12/2016   ADDENDUM REPORT: 09/12/2016 09:43 ADDENDUM: Voice recognition error in impression. First sentence of the IMPRESSION should be corrected to state: Large PARTIALLY necrotic tumor mass in RIGHT pelvis 11.5 x 9.6 x 9.5 cm, causing obstruction of the RIGHT ureter and significant RIGHT hydroureteronephrosis. Remainder of impression as previously dictated. Electronically Signed   By: Lavonia Dana M.D.   On: 09/12/2016 09:43   Result Date: 09/12/2016 CLINICAL DATA:  Abdominal pain, nausea, and vomiting, decreased ostomy output since Thursday, suspected bowel obstruction, history of ovarian cancer 2013, bladder cancer 2015, paroxysmal atrial flutter EXAM: CT ABDOMEN AND PELVIS WITHOUT CONTRAST TECHNIQUE: Multidetector CT imaging of the abdomen and pelvis was performed following the standard protocol without IV contrast. Sagittal and coronal MPR images reconstructed from axial data set. No oral contrast was administered. COMPARISON:  10/04/2015 FINDINGS: Lower chest: Bibasilar atelectasis greater on RIGHT Hepatobiliary: Gallbladder unremarkable. No definite focal liver lesion. Pancreas: Atrophic without mass Spleen: Normal appearance Adrenals/Urinary Tract: Adrenal glands normal appearance. Tiny nonobstructing calculus LEFT kidney. LEFT kidney in ureter otherwise normal appearance. Marked RIGHT hydronephrosis and hydroureter since previous exam. Tiny nonobstructing calculus within the RIGHT renal pelvis. RIGHT ureteral dilatation terminates at a large soft tissue mass in the RIGHT pelvis compatible with neoplasm, likely representing significant interval in growth with of  the partially necrotic tumor mass identified in the RIGHT pelvis on the previous exam now measuring 11.5 x 9.6 x 9.5 cm, likely arising from the RIGHT lateral aspect of the bladder. Air is seen within urinary bladder. Additional foci of air seen within the mass likely representing necrosis/cavitation. The mass is contiguous with multiple pelvic bowel loops. Stomach/Bowel: Stomach partially distended. Dilated proximal small bowel with decompressed distal small bowel compatible small bowel obstruction. Point of transition is not clearly delineated but appears to be in the pelvis question related to adhesion or observed RIGHT pelvic tumor. Colon appears decompressed. Scattered colonic diverticulosis. LEFT lower quadrant colostomy. Parastomal  herniation of a segment of transverse colon without colonic obstruction/dilatation. Vascular/Lymphatic: Atherosclerotic calcifications without aneurysm. Pacemaker leads RIGHT atrium and RIGHT ventricle. No definite adenopathy. Reproductive: Uterus surgically absent by history. Ovaries not visualized. Other: Scattered free fluid.  No definite free air. Musculoskeletal: Osseous demineralization with mild scoliosis and diffuse degenerative disc disease changes of the lumbar spine IMPRESSION: Large Ashley necrotic tumor mass in RIGHT pelvis 11.5 x 9.6 x 9.5 cm, causing obstruction of the RIGHT ureter and significant RIGHT hydroureteronephrosis. Tumor is likely arising from the RIGHT lateral aspect of the urinary bladder, which appears compressed and encompassed by the mass. Mid small bowel obstruction, favor due to either to adhesions or tumor in pelvis. Diffuse colonic diverticulosis with parastomal herniation of a segment of transverse colon adjacent to the colostomy LEFT lower quadrant without evidence of colonic obstruction. Electronically Signed: By: Lavonia Dana M.D. On: 09/11/2016 10:18   Dg Chest Portable 1 View  Result Date: 09/11/2016 CLINICAL DATA:  Pain nausea and  vomiting. EXAM: PORTABLE CHEST 1 VIEW COMPARISON:  Chest CT 03/10/2016.  Chest x-ray 03/23/2014 FINDINGS: The cardio pericardial silhouette is enlarged. Interstitial markings are diffusely coarsened with chronic features. Patchy airspace disease infrahilar left lung base. Stable scarring at the right base. The NG tube passes into the stomach although the distal tip position is not included on the film. Bones are diffusely demineralized. Telemetry leads overlie the chest. IMPRESSION: Cardiomegaly with underlying chronic interstitial changes. Left base atelectasis or infiltrate. Electronically Signed   By: Misty Stanley M.D.   On: 09/11/2016 14:37   Dg Abd Portable 1v  Result Date: 09/13/2016 CLINICAL DATA:  Small bowel obstruction EXAM: PORTABLE ABDOMEN - 1 VIEW COMPARISON:  09/12/2016 FINDINGS: Persistent gaseous distended small bowel loops mid and lower abdomen consistent with small bowel obstruction. NG tube is unchanged in position with tip in mid stomach. Paucity of bowel gas within colon. IMPRESSION: Persistent small bowel obstruction.  Stable NG tube position. Electronically Signed   By: Lahoma Crocker M.D.   On: 09/13/2016 08:48   Dg Abd Portable 1v-small Bowel Obstruction Protocol-initial, 8 Hr Delay  Result Date: 09/12/2016 CLINICAL DATA:  Patient status post contrast. EXAM: PORTABLE ABDOMEN - 1 VIEW COMPARISON:  Abdominal radiograph earlier same day. FINDINGS: Enteric tube tip and side-port project over the stomach. Monitoring leads overlie the patient. Oral contrast material is demonstrated within the stomach. Persistent gaseous distended loops of small bowel within the central abdomen. Lumbar spine degenerative changes. IMPRESSION: Persistent small bowel obstruction. Oral contrast material in the stomach. Electronically Signed   By: Lovey Newcomer M.D.   On: 09/12/2016 18:50   Dg Abd Portable 1v-small Bowel Protocol-position Verification  Result Date: 09/12/2016 CLINICAL DATA:  Nasogastric tube  placement for small bowel obstruction EXAM: PORTABLE ABDOMEN - 1 VIEW COMPARISON:  Portable exam 0910 hours compared to CT abdomen and pelvis 09/11/2016 FINDINGS: Tip of nasogastric tube projects over stomach. Dilated small bowel loops compatible with small bowel obstruction No definite bowel wall thickening. Bones demineralized with degenerative changes and scoliosis lumbar spine. Aortic atherosclerosis. IMPRESSION: Dilated small bowel loops with paucity of colonic gas consistent with small bowel obstruction. Nasogastric tube projects over stomach. Electronically Signed   By: Lavonia Dana M.D.   On: 09/12/2016 09:40        Scheduled Meds: . chlorhexidine  15 mL Mouth Rinse BID  . heparin  5,000 Units Subcutaneous Q8H  . lidocaine  1 application Topical Once  . mouth rinse  15 mL Mouth Rinse  [id number]  . sodium chloride flush  3 mL Intravenous Q12H   Continuous Infusions: . sodium chloride 75 mL/hr (09/12/16 2234)  . diltiazem (CARDIZEM) infusion 17.5 mg/hr (09/13/16 0514)     LOS: 2 days    Time spent: [place], MD [facility]   If 7PM-7AM, please contact night-coverage [email or web address] Password [email or web address] 09/13/2016, 9:05 AM

## 2016-09-13 NOTE — Progress Notes (Signed)
Remains on cardizem drip at 15cc/hr.  This shift bp ranging from 97/57 to 118/58 and HR from 118 to 145.  Mostly now in 130's. DR. Maudie Mercury on call and notified and orders increase to 17.5 monitor for 56mins and if needed increase 120cc/hr

## 2016-09-13 NOTE — Care Management Note (Signed)
Case Management Note  Patient Details  Name: Victoria [name] MRN: [id number] Date of Birth: 04-09-1927  Subjective/Objective:   83 y/o f admitted w/afib w/rvr, SBO. Hx: DNR, metastatic ca of abdomen, pace maker. NGT. Palliative cons-await recc.                 Action/Plan:d/c plan home.   Expected Discharge Date:                  Expected Discharge Plan:  Home w Hospice Care  In-House Referral:     Discharge planning Services  CM Consult  Post Acute Care Choice:    Choice offered to:     DME Arranged:    DME Agency:     HH Arranged:    [place] Agency:     Status of Service:  In process, will continue to follow  If discussed at Long Length of Stay Meetings, dates discussed:    Additional Comments:  Dessa Phi, RN 09/13/2016, 12:18 PM

## 2016-09-14 ENCOUNTER — Inpatient Hospital Stay (HOSPITAL_COMMUNITY): Payer: Medicare Other

## 2016-09-14 DIAGNOSIS — Z515 Encounter for palliative care: Secondary | ICD-10-CM

## 2016-09-14 DIAGNOSIS — Z66 Do not resuscitate: Secondary | ICD-10-CM

## 2016-09-14 LAB — GLUCOSE, CAPILLARY
Glucose-Capillary: 81 mg/dL (ref 65–99)
Glucose-Capillary: 82 mg/dL (ref 65–99)

## 2016-09-14 MED ORDER — MORPHINE SULFATE (CONCENTRATE) 10 MG /0.5 ML PO SOLN: 10.0000 mg | ORAL | 0 refills | Status: AC | PRN

## 2016-09-14 MED ORDER — MORPHINE SULFATE (PF) 4 MG/ML IV SOLN
2.0000 mg | INTRAVENOUS | Status: DC | PRN
  Administered 2016-09-14 – 2016-09-15 (×4): 2 mg via INTRAVENOUS
  Filled 2016-09-14 (×5): qty 1

## 2016-09-14 MED ORDER — LORAZEPAM 2 MG/ML PO CONC: 1.0000 mg | Freq: Three times a day (TID) | ORAL | 0 refills | Status: AC

## 2016-09-14 MED ORDER — LORAZEPAM 2 MG/ML IJ SOLN: 1.0000 mg | Freq: Four times a day (QID) | INTRAMUSCULAR | Status: DC | PRN

## 2016-09-14 NOTE — Discharge Summary (Signed)
Physician Discharge Summary  PACE PUMA [id number] DOB: 11-08-1926 DOA: 09/11/2016  PCP: Donnajean Lopes, MD  Admit date: 09/11/2016 Discharge date: 09/14/2016  Time spent: 35 minutes  Recommendations for Outpatient Follow-up:  1. Recommend hospice follow the patient as an outpatient and determine ongoing needs for morphine and Ativan 2. Patient should be discharged with Foley catheter as cold of care is comfort 3. Hospice has offered choice and patient will be going to hosp[facility] 4. 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)   SBO (small bowel obstruction)   RBBB   SSS (sick sinus syndrome) (HCC)   Atrial fibrillation with RVR (HCC)   AKI (acute kidney injury) ([place])   Hydronephrosis of right kidney   Pressure injury of skin   Colostomy diversion    DNR (do not resuscitate)   Palliative care by specialist   Discharge Condition: Guarded  Diet recommendation: Liberalize to comfort feeds if able to  [facility] Weights   09/11/16 1435  Weight: 50.2 kg (110 lb 10.7 oz)    History of present illness:   Ov Ca diag as rectal mass 03/13/12-s/p lap + Jej-Jej bypass + loop colostomy 04/04/12 Sigmoidectomy-revision end ostomy 10/03/12             Invasion of urinary bladder at TURBT and Fulguration 09/02/13             Ex-lap lysis 10/09/15 for SBO 4 previous palliative resections--on 09/18/2103, 07/02/2014, 11/21/2014 and 10/13/2015 as well as palaitive XRT SSS-Afib CHad2Vasc2 score=4 s/p PPM st Jude implant 2010-Atrial paced  Admit with SBO, N/V/decreasing Ostomy OP. Rapid Afib Na 130, Creat 1.7 [basleine 14/0.9] Lactate 3.19. Wbc 15  She did not turn the corner and there was no resolution of her small  bowel obstruction by 09/14/16 general surgery had been consulted due to GI series as below which showed persisting obstruction  patient stated she would not want to have an intervention done and was anxious and hospice services and palliative care was consulted  SBO- appreciate Gen surgery input-S/p Adhesiolysis 09/2015.  Hopeful for resolution.  NG to wall suction out only 70 cc? --put out 220 to RN.  Gastrograffin axr testing  confirms persisting obstruction. Patient is interested in palliative care, does not wish further abdominal surgery Currently patient should follow with hospice who will be seeing   [facility] [place] [facility] provided needed equipment  Lactic acidosis Acidosis 2/2 renal insufficiency and resolving 3.1--1.8 with IV fluids--saline locked on d/c  Sss/Afib CHad2Vasc2 score=4/ ppm implant 2010 IN RVR on admission-Continue IV cardizem till d/c-poor GI absorptivity in sdu for now.  Would not convert to oral meds  Ovarian Ca s/p Jej-Jej bypass/loop colostomy 03/2012 with Kidney invasion-Kindey function stabilizing.  Cont saline-do not think will ned per c nephrostomy placement if creat continues to resolve--> creatinine 1.7-1.4-1.3  Hyponatremia-hypovolemic-acute kidney injury, initial concern for obstructive uropathy on admission-now resolving.  Continuing NS 75 cc/h--1:30-->135  No further work up as OP  Leukocytosis-afebrile, L base infiltrate? Rpt CXR am-hold abx goals of care currently is now hospice   Consultations:  gen surgery  Pallaitve care  Discharge Exam: Vitals:   09/14/16 0917 09/14/16 1218  BP: (!) 103/56 (!) 102/56  Pulse: 90 86  Resp:  18  Temp:  97.7 F (36.5 C)    General:  Alert pleasant no distress.  Comfy Cardiovascular: s1 s 2no m/r/g Respiratory: clear no added sound  Discharge Instructions   Discharge Instructions    Diet - low sodium heart healthy    Complete by:  As directed    Do Not Remove Foley    Complete by:  As  directed    Increase activity slowly    Complete by:  As directed      Current Discharge Medication List    START taking these medications   Details  LORazepam (ATIVAN) 2 MG/ML concentrated solution Take 0.5 mLs (1 mg total) by mouth every 8 (eight) hours. Qty: 30 mL, Refills: 0    Morphine Sulfate (MORPHINE CONCENTRATE) 10 mg / 0.5 ml concentrated solution Take 0.5 mLs (10 mg total) by mouth every 4 (four) hours as needed for severe pain. Qty: 30 mL, Refills: 0      CONTINUE these medications which have NOT CHANGED   Details  furosemide (LASIX) 20 MG tablet take 1 tablet by mouth once daily Qty: 90 tablet, Refills: 2    nystatin-triamcinolone ointment (MYCOLOG) apply to affected area twice a day if needed for itching Refills: 0      STOP taking these medications     diltiazem (CARDIZEM CD) 360 MG 24 hr capsule      iron polysaccharides (NIFEREX) 150 MG capsule      potassium chloride (K-DUR) 10 MEQ tablet        Allergies  Allergen Reactions  . Pindolol Nausea Only and Swelling    Throat swells  . Iodine Other (See Comments)     TOPICAL IODINE///BLISTERS  . Shellfish Allergy Diarrhea and Nausea And Vomiting  . Vibramycin [Doxycycline Calcium] Nausea Only and Other (See Comments)    dizziness  . Doxycycline Nausea Only  . Levofloxacin Other (See Comments)    DIZZINESS  . Multaq [Dronedarone] Other (See Comments)    CAUSED SEVERE BURNING GI TRACT  . Vicodin [Hydrocodone-Acetaminophen] Other (See Comments)    dizziness      The results of significant diagnostics from this hospitalization (including imaging, microbiology, ancillary and laboratory) are listed below for reference.    Significant Diagnostic Studies: Ct Abdomen Pelvis Wo Contrast  Addendum Date: 09/12/2016   ADDENDUM REPORT: 09/12/2016 09:43 ADDENDUM: Voice recognition error in impression. First sentence of the IMPRESSION should be corrected to state: Large PARTIALLY necrotic tumor mass in RIGHT  pelvis 11.5 x 9.6 x 9.5 cm, causing obstruction of the RIGHT ureter and significant RIGHT hydroureteronephrosis. Remainder of impression as previously dictated. Electronically Signed   By: Lavonia Dana M.D.   On: 09/12/2016 09:43   Result Date: 09/12/2016 CLINICAL DATA:  Abdominal pain, nausea, and vomiting, decreased ostomy output since Thursday, suspected bowel obstruction, history of ovarian cancer 2013, bladder cancer 2015, paroxysmal atrial flutter EXAM: CT ABDOMEN AND PELVIS WITHOUT CONTRAST TECHNIQUE: Multidetector CT imaging of the abdomen and pelvis was performed following the standard protocol without IV contrast. Sagittal and coronal MPR images reconstructed from axial data set. No oral contrast was administered. COMPARISON:  10/04/2015 FINDINGS: Lower chest: Bibasilar atelectasis greater on RIGHT Hepatobiliary: Gallbladder unremarkable. No definite focal liver lesion. Pancreas: Atrophic without mass Spleen: Normal appearance Adrenals/Urinary Tract: Adrenal glands normal appearance. Tiny nonobstructing calculus LEFT kidney. LEFT kidney in ureter otherwise normal appearance. Marked RIGHT hydronephrosis and hydroureter since previous exam. Tiny nonobstructing calculus within the RIGHT renal  pelvis. RIGHT ureteral dilatation terminates at a large soft tissue mass in the RIGHT pelvis compatible with neoplasm, likely representing significant interval in growth with of the partially necrotic tumor mass identified in the RIGHT pelvis on the previous exam now measuring 11.5 x 9.6 x 9.5 cm, likely arising from the RIGHT lateral aspect of the bladder. Air is seen within urinary bladder. Additional foci of air seen within the mass likely representing necrosis/cavitation. The mass is contiguous with multiple pelvic bowel loops. Stomach/Bowel: Stomach partially distended. Dilated proximal small bowel with decompressed distal small bowel compatible small bowel obstruction. Point of transition is not clearly delineated  but appears to be in the pelvis question related to adhesion or observed RIGHT pelvic tumor. Colon appears decompressed. Scattered colonic diverticulosis. LEFT lower quadrant colostomy. Parastomal herniation of a segment of transverse colon without colonic obstruction/dilatation. Vascular/Lymphatic: Atherosclerotic calcifications without aneurysm. Pacemaker leads RIGHT atrium and RIGHT ventricle. No definite adenopathy. Reproductive: Uterus surgically absent by history. Ovaries not visualized. Other: Scattered free fluid.  No definite free air. Musculoskeletal: Osseous demineralization with mild scoliosis and diffuse degenerative disc disease changes of the lumbar spine IMPRESSION: Large Ashley necrotic tumor mass in RIGHT pelvis 11.5 x 9.6 x 9.5 cm, causing obstruction of the RIGHT ureter and significant RIGHT hydroureteronephrosis. Tumor is likely arising from the RIGHT lateral aspect of the urinary bladder, which appears compressed and encompassed by the mass. Mid small bowel obstruction, favor due to either to adhesions or tumor in pelvis. Diffuse colonic diverticulosis with parastomal herniation of a segment of transverse colon adjacent to the colostomy LEFT lower quadrant without evidence of colonic obstruction. Electronically Signed: By: Lavonia Dana M.D. On: 09/11/2016 10:18   Dg Chest Portable 1 View  Result Date: 09/11/2016 CLINICAL DATA:  Pain nausea and vomiting. EXAM: PORTABLE CHEST 1 VIEW COMPARISON:  Chest CT 03/10/2016.  Chest x-ray 03/23/2014 FINDINGS: The cardio pericardial silhouette is enlarged. Interstitial markings are diffusely coarsened with chronic features. Patchy airspace disease infrahilar left lung base. Stable scarring at the right base. The NG tube passes into the stomach although the distal tip position is not included on the film. Bones are diffusely demineralized. Telemetry leads overlie the chest. IMPRESSION: Cardiomegaly with underlying chronic interstitial changes. Left base  atelectasis or infiltrate. Electronically Signed   By: Misty Stanley M.D.   On: 09/11/2016 14:37   Dg Abd Acute W/chest  Result Date: 09/14/2016 CLINICAL DATA:  Abdominal pain and distension. Has diagnosis of small bowel obstruction. The patient is reporting shortness of breath, is on supplemental oxygen. EXAM: DG ABDOMEN ACUTE W/ 1V CHEST COMPARISON:  Chest x-ray dated September 11 2016 and abdominal radiograph of September 13, 2016. FINDINGS: The lungs remain hyperinflated. There is persistent increased density at the right lung base with parenchymal scarring in the right upper lobe. There is no large pleural effusion and no pneumothorax. The heart is top-normal in size. The pulmonary vascularity is normal. The ICD is in stable position. There is calcification in the wall of the aortic arch. The proximal port of the esophagogastric tube lies at or just below the GE junction. The tip projects below the inferior margin of the image. There remain loops of moderately distended gas-filled small bowel in the mid abdomen. There is a small amount of rectal gas. No significant colonic gas is observed. The tip of the nasogastric tube lies in the region of the mid gastric body. There is dense calcification in the wall of the abdominal aorta. There is degenerative disc  disease of the lumbar spine with moderate levocurvature. IMPRESSION: Persistent small bowel obstruction little change since yesterday's study. No free extraluminal gas is observed. COPD. Mild cardiomegaly without pulmonary edema. No significant pleural effusion. Thoracic aortic atherosclerosis. Advancement of the nasogastric tube by 5 cm would assure that the proximal port remains below the GE junction. Electronically Signed   By: David  Martinique M.D.   On: 09/14/2016 10:29   Dg Abd Portable 1v  Result Date: 09/13/2016 CLINICAL DATA:  Small bowel obstruction EXAM: PORTABLE ABDOMEN - 1 VIEW COMPARISON:  09/12/2016 FINDINGS: Persistent gaseous distended small  bowel loops mid and lower abdomen consistent with small bowel obstruction. NG tube is unchanged in position with tip in mid stomach. Paucity of bowel gas within colon. IMPRESSION: Persistent small bowel obstruction.  Stable NG tube position. Electronically Signed   By: Lahoma Crocker M.D.   On: 09/13/2016 08:48   Dg Abd Portable 1v-small Bowel Obstruction Protocol-initial, 8 Hr Delay  Result Date: 09/12/2016 CLINICAL DATA:  Patient status post contrast. EXAM: PORTABLE ABDOMEN - 1 VIEW COMPARISON:  Abdominal radiograph earlier same day. FINDINGS: Enteric tube tip and side-port project over the stomach. Monitoring leads overlie the patient. Oral contrast material is demonstrated within the stomach. Persistent gaseous distended loops of small bowel within the central abdomen. Lumbar spine degenerative changes. IMPRESSION: Persistent small bowel obstruction. Oral contrast material in the stomach. Electronically Signed   By: Lovey Newcomer M.D.   On: 09/12/2016 18:50   Dg Abd Portable 1v-small Bowel Protocol-position Verification  Result Date: 09/12/2016 CLINICAL DATA:  Nasogastric tube placement for small bowel obstruction EXAM: PORTABLE ABDOMEN - 1 VIEW COMPARISON:  Portable exam 0910 hours compared to CT abdomen and pelvis 09/11/2016 FINDINGS: Tip of nasogastric tube projects over stomach. Dilated small bowel loops compatible with small bowel obstruction No definite bowel wall thickening. Bones demineralized with degenerative changes and scoliosis lumbar spine. Aortic atherosclerosis. IMPRESSION: Dilated small bowel loops with paucity of colonic gas consistent with small bowel obstruction. Nasogastric tube projects over stomach. Electronically Signed   By: Lavonia Dana M.D.   On: 09/12/2016 09:40    Microbiology: Recent Results (from the past 240 hour(s))  MRSA PCR Screening     Status: None   Collection Time: 09/11/16  2:30 PM  Result Value Ref Range Status   MRSA by PCR NEGATIVE NEGATIVE Final    Comment:         The GeneXpert MRSA Assay (FDA approved for NASAL specimens only), is one component of a comprehensive MRSA colonization surveillance program. It is not intended to diagnose MRSA infection nor to guide or monitor treatment for MRSA infections.   Culture, blood (Routine X 2) w Reflex to ID Panel     Status: None (Preliminary result)   Collection Time: 09/11/16  3:03 PM  Result Value Ref Range Status   Specimen Description BLOOD LEFT ANTECUBITAL  Final   Special Requests BOTTLES DRAWN AEROBIC AND ANAEROBIC 5CC  Final   Culture   Final    NO GROWTH 3 DAYS Performed at [facility], [address] [place], [place] [address]    Report Status PENDING  Incomplete  Culture, blood (Routine X 2) w Reflex to ID Panel     Status: None (Preliminary result)   Collection Time: 09/11/16  3:08 PM  Result Value Ref Range Status   Specimen Description BLOOD RIGHT ARM  Final   Special Requests BOTTLES DRAWN AEROBIC AND ANAEROBIC 5CC  Final   Culture  Final    NO GROWTH 3 DAYS Performed at [facility], [place] [address] [place], [place] [address]    Report Status PENDING  Incomplete     Labs: Basic Metabolic Panel:  Recent Labs Lab 09/11/16 0827 09/12/16 0327 09/13/16 0530  NA 130* 134* 135  K 4.5 3.9 3.7  CL 94* 100* 104  CO2 28 26 22   GLUCOSE 174* 114* 84  BUN 19 17 16   CREATININE 1.74* 1.46* 1.30*  CALCIUM 9.2 8.3* 8.3*  MG  --   --  1.5*   Liver Function Tests:  Recent Labs Lab 09/11/16 0827 09/13/16 0530  AST 20 16  ALT 9* 8*  ALKPHOS 82 64  BILITOT 0.9 0.9  PROT 6.9 5.6*  ALBUMIN 2.9* 2.2*    Recent Labs Lab 09/11/16 0827  LIPASE 14   No results for input(s): AMMONIA in the last 168 hours. CBC:  Recent Labs Lab 09/11/16 0827 09/12/16 0327 09/12/16 1644  WBC 15.9* 11.9* 13.7*  NEUTROABS 14.3*  --  12.7*  HGB 11.7* 10.7* 11.0*  HCT 35.6* 32.7* 33.7*  MCV 86.8 86.7 86.9  PLT 318 315 289   Cardiac Enzymes: No results for input(s):  CKTOTAL, CKMB, CKMBINDEX, TROPONINI in the last 168 hours. BNP: BNP (last 3 results) No results for input(s): BNP in the last 8760 hours.  ProBNP (last 3 results) No results for input(s): PROBNP in the last 8760 hours.  CBG:  Recent Labs Lab 09/11/16 2010 09/14/16 0732 09/14/16 1130  GLUCAP 115* 81 82       Signed:  Nita Sells MD   [facility] 09/14/2016, 5:05 PM

## 2016-09-14 NOTE — Consult Note (Signed)
Consultation Note Date: 09/14/2016   Patient Name: Victoria [name]  DOB: Jun 02, 1927  MRN: [id number]  Age / Sex: 83 y.o., female  PCP: Leanna Battles, MD Referring Physician: Nita Sells, MD  Reason for Consultation: Establishing goals of care and Psychosocial/spiritual support  HPI/Patient Profile: 84 y.o. female  admitted on 09/11/2016 with a past medical history of hypertension, Afib, presented with 2 day history of decreased colostomy output, and the patient began having 2-3 episodes of emesis beginning on 09/10/2016.  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 On 09/02/2013, the patient underwent TURBT and fulguration when it was found that her ovarian cancer had invaded her urinary bladder. More recently, the patient had expiratory laparotomy with lysis of adhesions on 10/09/2015 secondary to small obstruction. The patient states that she had a recent cystoscopy performed by Dr. Zannie Cove one week prior to this admission at which time there is approximately 10% invasion of the bladder by her ovarian tumor, but decision was made to observe clinically),    CT of the abdomen and pelvis at the time of admission showed right ureteral dilatation that terminates at a large soft tissue mass in the right pelvis.  There was also dilated small bowel with decompressed distal small bowel consistent with a small bowel obstruction without clear transition point.  The patient has had previous hospital admissions with rapid atrial arrhythmia often exacerbated by small bowel obstruction leading to inability to take her oral medications. The patient has had a history of recurrent small bowel obstructions related to  her metastatic ovarian carcinoma with extensive pelvic involvement including invasion of her urinary bladder.   Patient faced advanced directive decisions and anticipatory care needs  Clinical Assessment and Goals of Care:  This NP Wadie Lessen reviewed medical records, received report from team, assessed the patient and then meet at the patient's bedside along with her son/Gary to discuss diagnosis, prognosis, GOC, EOL wishes disposition and options.  A detailed discussion was had today regarding advanced directives.  Concepts specific to code status, artifical feeding and hydration, continued IV antibiotics and rehospitalization was had.  The difference between a aggressive medical intervention path  and a palliative comfort care path for this patient at this time was had.  Values and goals of care important to patient and family were attempted to be elicited.  Today the patient tells me she has come to the understanding that she cannot do any more surgery, "I am too weak".  She believes her time "is short" and her hope for herself is comfort and dignity.  Concept of Hospice and Palliative Care were discussed  Natural trajectory and expectations at EOL were discussed.  Questions and concerns addressed.   Family encouraged to call with questions or concerns.  PMT will continue to support holistically.   PATIENT makes her own decisions with the support of her family  SUMMARY OF RECOMMENDATIONS    Code Status/Advance Care Planning:  DNR   Symptom Management:   Pain: Leave NG tube to low suction for comfort      Morphine IV 2 mg every 1 hr prn pain/dyspnea  Ativan 1 mg IV every 6 hrs prn  Leave foley on dc for comfort  DC Cardizem on discharge  Palliative Prophylaxis:   Frequent Pain Assessment and Oral Care  Additional Recommendations (Limitations, Scope, Preferences):  Full Comfort Care  Psycho-social/Spiritual:   Desire for further Chaplaincy  support:no  Additional Recommendations: Education on Hospice  Prognosis:   < 2 weeks, once full shift to comfort, no further diagnostics, labs, IV fluids; expect rapid decleine  Discharge Planning: Hospice facility      Primary Diagnoses: Present on Admission: . Atrial fibrillation with RVR ([place]) . SSS (sick sinus syndrome) ([place]) . SBO (small bowel obstruction) . RBBB . PAF (paroxysmal atrial fibrillation) ([place]) . Hyponatremia . Protein-calorie malnutrition ([place]) . Hydronephrosis of right kidney . Malignant neoplasm of ovary invading into bladder & rectum . Cancer involving bladder by direct extension from ovary [facility])   I have reviewed the medical record, interviewed the patient and family, and examined the patient. The following aspects are pertinent.  Past Medical History:  Diagnosis Date  . Atrial flutter with rapid ventricular response ([place]) 10/06/2015  . Bladder cancer [facility]) recurrent bladder tumor    secondarty to ovarian cancer--  palliative radiation 09-13-2013 to 10-22-2013  . Cardiac pacemaker in situ 2010   [place]-- PLACEMENT 09-10-2008  . Colostomy in place [facility])   . Diverticulosis   . Fluid collection (edema) in the arms, legs, hands and feet   . Gross hematuria    unable to anticoagulate  . Hyperlipidemia   . Hypertension   . Iron deficiency anemia   . Malignant neoplasm of apex of urinary bladder ([place]) 09/29/2015  . Malignant neoplasm of ovary ([place]) ONOCOLOGIST--  DR Benay Spice  03-13-2012  w/ rectal mass   dx  primay ovarian cancer , +cytokeratin 7, estrogen receptor and WT-1--  chemotherapy and s/p omentectomy and bilateral oophorectomy03-05-2013  . Nausea 2015   chronic intermittent nausea after chemo  . Nocturia   . Paroxysmal atrial flutter ([facility])    CARDIOLOGIST--  DR CROITORU  . RBBB   . Small bowel obstruction, surgery 04/04/12 03/24/2012  . SSS (sick sinus syndrome) ([facility])    S/P PACEMAKER   Social History   Social History  . Marital status:  Married    Spouse name: N/A  . Number of children: 2  . Years of education: N/A   Occupational History  . Retired     Event organiser,     Social History Main Topics  . Smoking status: Never Smoker  . Smokeless tobacco: Never Used  . Alcohol use No  . Drug use: No  . Sexual activity: Not Asked   Other Topics Concern  . None   Social History Narrative   Married   Family History  Problem Relation Age of Onset  . Heart disease Mother   . CAD Mother   . Stroke Sister   . Cancer Neg Hx   . Diabetes Neg Hx    Scheduled Meds: . chlorhexidine  15 mL Mouth Rinse BID  . heparin  5,000 Units Subcutaneous Q8H  . lidocaine  1 application Topical Once  . mouth rinse  15 mL Mouth Rinse q12n4p  . sodium chloride flush  3 mL Intravenous Q12H   Continuous Infusions: .  sodium chloride 75 mL/hr at 09/14/16 0130  . diltiazem (CARDIZEM) infusion 15 mg/hr (09/14/16 0838)   PRN Meds:.acetaminophen **OR** acetaminophen, metoprolol, morphine injection, ondansetron **OR** ondansetron (ZOFRAN) IV Medications Prior to Admission:  Prior to Admission medications   Medication Sig Start Date End Date Taking? Authorizing Provider  diltiazem (CARDIZEM CD) 360 MG 24 hr capsule take 1 capsule by mouth once daily 10/21/15  Yes Mihai Croitoru, MD  furosemide (LASIX) 20 MG tablet take 1 tablet by mouth once daily Patient taking differently: take 1 tablet by mouth once a week on "Sunday 09/01/16  Yes Mihai Croitoru, MD  iron polysaccharides (NIFEREX) 150 MG capsule Take 150 mg by mouth 3 (three) times a week.   Yes Historical Provider, MD  nystatin-triamcinolone ointment (MYCOLOG) apply to affected area twice a day if needed for itching 08/31/16  Yes Historical Provider, MD  potassium chloride (K-DUR) 10 MEQ tablet take 1 tablet by mouth once daily Patient taking differently: take 1 tablet by mouth once a week on Sunday 09/01/16  Yes Mihai Croitoru, MD   Allergies  Allergen Reactions  . Pindolol Nausea Only and  Swelling    Throat swells  . Iodine Other (See Comments)     TOPICAL IODINE///BLISTERS  . Shellfish Allergy Diarrhea and Nausea And Vomiting  . Vibramycin [Doxycycline Calcium] Nausea Only and Other (See Comments)    dizziness  . Doxycycline Nausea Only  . Levofloxacin Other (See Comments)    DIZZINESS  . Multaq [Dronedarone] Other (See Comments)    CAUSED SEVERE BURNING GI TRACT  . Vicodin [Hydrocodone-Acetaminophen] Other (See Comments)    dizziness   Review of Systems  Constitutional: Positive for unexpected weight change.  Neurological: Positive for weakness.    Physical Exam  Constitutional: She appears cachectic. She appears ill.  Cardiovascular: Normal rate, regular rhythm and normal heart sounds.   Pulmonary/Chest: She has decreased breath sounds in the right lower field and the left lower field.  Abdominal: Bowel sounds are absent. There is tenderness.  Noted colostomy  Neurological: She is alert.    Vital Signs: BP (!) 103/56   Pulse 90 Comment: listening with stethoscope  Temp 98.3 F (36.8 C) (Oral)   Resp 16   Ht 5' (1.524 m)   Wt 50.2 kg (110 lb 10.7 oz)   SpO2 95%   BMI 21.61 kg/m  Pain Assessment: 0-10   Pain Score: 0-No pain   SpO2: SpO2: 95 % O2 Device:SpO2: 95 % O2 Flow Rate: .   IO: Intake/output summary:  Intake/Output Summary (Last 24 hours) at 09/14/16 1017 Last data filed at 09/14/16 0900  Gross per 24 hour  Intake             1440 ml  Output             1150 ml  Net              29" 0 ml    LBM: Last BM Date:  (no gas or output in colostomy x slight clear/amber liquid) Baseline Weight: Weight: 50.2 kg (110 lb 10.7 oz) Most recent weight: Weight: 50.2 kg (110 lb 10.7 oz)     Palliative Assessment/Data: 20 % at best   Flowsheet Rows   Flowsheet Row Most Recent Value  Intake Tab  Referral Department  Hospitalist  Unit at Time of Referral  Cardiac/Telemetry Unit  Palliative Care Primary Diagnosis  Other (Comment) [GI]  Date  Notified  09/13/16  Palliative Care Type  New Palliative care  Reason for referral  Clarify Goals of Care  Date of Admission  09/11/16  # of days IP prior to Palliative referral  2  Clinical Assessment  Psychosocial & Spiritual Assessment  Palliative Care Outcomes     Discussed with Dr Verlon Au  Time In: 0745 Time Out: 0845 Time Total: 75 min  Greater than 50%  of this time was spent counseling and coordinating care related to the above assessment and plan.  Signed by: Wadie Lessen, NP   Please contact Palliative Medicine Team phone at [phone] for questions and concerns.  For individual provider: See Shea Evans

## 2016-09-14 NOTE — Progress Notes (Signed)
Advanced NG tube 5 cm per recommendations via x-ray findings.  Pt tolerated well.  Large amount of brown liquid removed when connected back to suction.

## 2016-09-14 NOTE — Progress Notes (Signed)
[facility] Office:  [phone] General Surgery Progress Note   LOS: 3 days  POD -     Assessment/Plan:  1. SBO  Probably secondary to large recurrent cancer.  KUB shows no improvement.  She's decided to follow palliative care and not do surgery.  I think that this is very reasonable.  Can probably stop x-rays and other test since she has decided on palliative care.   2. Metastatic ovarian cancer  She was follow by Dr. Benay Spice - but she has not seen him since last fall. 3. Right Ureteral Obstruction  Followed by Dr. Diona Fanti  No plan for surgery at this time. 4. Colostomy  partial sigmoid colectomy with end colostomy 2013.  Nancy Marus and Serita Grammes did original surgery. She had a prolapsed ostomy and had this revised in 2014  She had an enterolysis by Dr. Marcello Moores - 10/04/2015. 5. DVT prophylaxis - SubQ Heparin  6. A.fib/flutter 7.  Has pacemaker - followed by Dr. Tommye Standard   Active Problems:   PAF (paroxysmal atrial fibrillation) ([facility])   Hyponatremia   Malignant neoplasm of ovary invading into bladder & rectum   Protein-calorie malnutrition ([place])   Cancer involving bladder by direct extension from ovary (H[facility])   SBO (small bowel obstruction)   RBBB   SSS (sick sinus syndrome) (H[facility])   Atrial fibrillation with RVR ([facility])   AKI (acute kidney injury) ([place])   Hydronephrosis of right kidney   Pressure injury of skin   Colostomy diversion    Subjective:  Comfortable.  We talked about Hospice and palliative care.  She does not want surgery.  Daughter in law, , Callan Beganovic, in room with patient.  Objective:   Vitals:   09/14/16 0659 09/14/16 0917  BP: 105/74 (!) 103/56  Pulse: (!) 169 90  Resp:    Temp:       Intake/Output from previous day:  02/19 0701 - 02/20 0700 In: 1630 [I.V.:1630] Out: 1150 [Urine:400; Emesis/NG output:750]  Intake/Output this shift:  No intake/output data recorded.   Physical Exam:   General: thin older WF who is  alert and oriented.    HEENT: Normal. Pupils equal. .   Lungs: clear   Abdomen: distended, but not tender.  Has LUQ ostomy with hernia.  Has small hernias in midline incision.  Has RLQ mass.   Lab Results:     Recent Labs  09/12/16 0327 09/12/16 1644  WBC 11.9* 13.7*  HGB 10.7* 11.0*  HCT 32.7* 33.7*  PLT 315 289    BMET    Recent Labs  09/12/16 0327 09/13/16 0530  NA 134* 135  K 3.9 3.7  CL 100* 104  CO2 26 22  GLUCOSE 114* 84  BUN 17 16  CREATININE 1.46* 1.30*  CALCIUM 8.3* 8.3*    PT/INR  No results for input(s): LABPROT, INR in the last 72 hours.  ABG  No results for input(s): PHART, HCO3 in the last 72 hours.  Invalid input(s): PCO2, PO2   Studies/Results:  Dg Abd Acute W/chest  Result Date: 09/14/2016 CLINICAL DATA:  Abdominal pain and distension. Has diagnosis of small bowel obstruction. The patient is reporting shortness of breath, is on supplemental oxygen. EXAM: DG ABDOMEN ACUTE W/ 1V CHEST COMPARISON:  Chest x-ray dated September 11 2016 and abdominal radiograph of September 13, 2016. FINDINGS: The lungs remain hyperinflated. There is persistent increased density at the right lung base with parenchymal scarring in the right upper lobe. There is no large pleural effusion and  no pneumothorax. The heart is top-normal in size. The pulmonary vascularity is normal. The ICD is in stable position. There is calcification in the wall of the aortic arch. The proximal port of the esophagogastric tube lies at or just below the GE junction. The tip projects below the inferior margin of the image. There remain loops of moderately distended gas-filled small bowel in the mid abdomen. There is a small amount of rectal gas. No significant colonic gas is observed. The tip of the nasogastric tube lies in the region of the mid gastric body. There is dense calcification in the wall of the abdominal aorta. There is degenerative disc disease of the lumbar spine with moderate levocurvature.  IMPRESSION: Persistent small bowel obstruction little change since yesterday's study. No free extraluminal gas is observed. COPD. Mild cardiomegaly without pulmonary edema. No significant pleural effusion. Thoracic aortic atherosclerosis. Advancement of the nasogastric tube by 5 cm would assure that the proximal port remains below the GE junction. Electronically Signed   By: Maalik Pinn  [place] M.D.   On: 09/14/2016 10:29   Dg Abd Portable 1v  Result Date: 09/13/2016 CLINICAL DATA:  Small bowel obstruction EXAM: PORTABLE ABDOMEN - 1 VIEW COMPARISON:  09/12/2016 FINDINGS: Persistent gaseous distended small bowel loops mid and lower abdomen consistent with small bowel obstruction. NG tube is unchanged in position with tip in mid stomach. Paucity of bowel gas within colon. IMPRESSION: Persistent small bowel obstruction.  Stable NG tube position. Electronically Signed   By: Lahoma Crocker M.D.   On: 09/13/2016 08:48   Dg Abd Portable 1v-small Bowel Obstruction Protocol-initial, 8 Hr Delay  Result Date: 09/12/2016 CLINICAL DATA:  Patient status post contrast. EXAM: PORTABLE ABDOMEN - 1 VIEW COMPARISON:  Abdominal radiograph earlier same day. FINDINGS: Enteric tube tip and side-port project over the stomach. Monitoring leads overlie the patient. Oral contrast material is demonstrated within the stomach. Persistent gaseous distended loops of small bowel within the central abdomen. Lumbar spine degenerative changes. IMPRESSION: Persistent small bowel obstruction. Oral contrast material in the stomach. Electronically Signed   By: Lovey Newcomer M.D.   On: 09/12/2016 18:50     Anti-infectives:   Anti-infectives    None      Alphonsa Overall, MD, FACS Pager: [facility] Office: [phone] 09/14/2016

## 2016-09-14 NOTE — Progress Notes (Signed)
PT Cancellation Note/Sign off   Patient Details Name: Victoria [name] MRN: [id number] DOB: 09-07-1926   Cancelled Treatment:    Reason Eval/Treat Not Completed: Note palliative consult and plan for comfort care and residential hospice. Will sign off.    Weston Anna, MPT Pager: [phone]

## 2016-09-15 DIAGNOSIS — G893 Neoplasm related pain (acute) (chronic): Secondary | ICD-10-CM

## 2016-09-15 NOTE — Clinical Social Work Note (Signed)
Clinical Social Work Assessment  Patient Details  Name: Victoria [name] MRN: [id number] Date of Birth: [birth year]  Date of referral:  09/15/16               Reason for consult:  End of Life/Hospice                Permission sought to share information with:  Facility Art therapist granted to share information::  Yes, Verbal Permission Granted  Name::        Agency::     Relationship::     Contact Information:     Housing/Transportation Living arrangements for the past 2 months:  [place] of Information:  Patient, Adult Children Patient Interpreter Needed:  None Criminal Activity/Legal Involvement Pertinent to Current Situation/Hospitalization:  No - Comment as needed Significant Relationships:  Adult Children Lives with:  Adult Children Do you feel safe going back to the place where you live?   (Residential hospice home recommened.) Need for family participation in patient care:  Yes (Comment)  Care giving concerns:  Pt's care cannot be managed at home following hospital d/c.   Social Worker assessment / plan:  Pt hospitalized from home with family on 09/11/16 with A complain of abdominal pain. Pt has multiple  medical concerns including metastatic ovarian carcinoma. CSW consulted to assist with residential hospice home placement.  CSW met with pt / daughter in-law to offer placement choice. Pt / family have requested H[facility] at [facility]. Referral has been made and a liaison will meet with pt / family this am. CSW will continue to follow to assist with d/c planning.  Employment status:  Retired Nurse, adult PT Recommendations:  Not assessed at this time Information / Referral to community resources:  Other (Comment Required) (residential hospice home)  Patient/Family's Response to care:  Pt / family requesting hospice home placement.  Patient/Family's Understanding of and Emotional Response to Diagnosis, Current  Treatment, and Prognosis:  Pt / family are aware of pt's medical status. They appreciate Palliative Care Team support and CSW assistance with d/c planning.  Emotional Assessment Appearance:  Appears stated age Attitude/Demeanor/Rapport:  Other (cooperative) Affect (typically observed):  Calm, Pleasant Orientation:  Oriented to Self, Oriented to Place, Oriented to  Time, Oriented to Situation Alcohol / Substance use:  Not Applicable Psych involvement (Current and /or in the community):  No (Comment)  Discharge Needs  Concerns to be addressed:  Discharge Planning Concerns Readmission within the last 30 days:  No Current discharge risk:  None Barriers to Discharge:  No Barriers Identified   Luretha Rued, [place] 09/15/2016, 10:04 AM

## 2016-09-15 NOTE — Progress Notes (Signed)
Patient transferred from 4th floor last night at approx 2130, NGT to LCWS, foley and ostomy in place, patient has foam dressing over bony areas of sacrum, resting comfortably, given pain meds as requested, no acute events will continue to monitor.

## 2016-09-15 NOTE — Progress Notes (Signed)
Diltiazem drip empty.  Dr. Wynelle Cleveland notified and DC order received.

## 2016-09-15 NOTE — Progress Notes (Signed)
Report phoned to Marcelene Butte at [facility].  Family at bedside.  Awaiting ambulance transport.

## 2016-09-15 NOTE — Progress Notes (Signed)
Daily Progress Note   Patient Name: Victoria [name]       Date: 09/15/2016 DOB: August 21, 1926  Age: 83 y.o. MRN#: [id number] Attending Physician: Debbe Odea, MD Primary Care Physician: Donnajean Lopes, MD Admit Date: 09/11/2016  Reason for Consultation/Follow-up: Establishing GOC and emotional support and EOL care  Subjective:  - meet with patient  at the bedside, she is comfortable with her decision to focus on comfort and forego life prolonging measures  - discussed utilization of medications for symptom management    -discussed natural trajectory and expectations at EOL, questions and concerns addressed  Length of Stay: 4  Current Medications: Scheduled Meds:  . chlorhexidine  15 mL Mouth Rinse BID  . lidocaine  1 application Topical Once  . mouth rinse  15 mL Mouth Rinse q12n4p  . sodium chloride flush  3 mL Intravenous Q12H    Continuous Infusions: . sodium chloride 30 mL/hr at 09/14/16 1416    PRN Meds: acetaminophen **OR** acetaminophen, LORazepam, morphine injection, ondansetron **OR** ondansetron (ZOFRAN) IV  Physical Exam  Constitutional: She appears lethargic. She appears cachectic. She appears ill.  Cardiovascular: Normal rate, regular rhythm and normal heart sounds.   Pulmonary/Chest: She has decreased breath sounds in the right lower field and the left lower field.  Abdominal: She exhibits distension. Bowel sounds are absent. There is tenderness.  Neurological: She appears lethargic.  Skin: Skin is warm and dry.            Vital Signs: BP 108/64 (BP Location: Left Arm)   Pulse (!) 58   Temp 97.7 F (36.5 C) (Oral)   Resp 18   Ht 5' (1.524 m)   Wt 52 kg (114 lb 10.2 oz)   SpO2 94%   BMI 22.39 kg/m  SpO2: SpO2: 94 % O2 Device: O2 Device: Not  Delivered O2 Flow Rate:    Intake/output summary:  Intake/Output Summary (Last 24 hours) at 09/15/16 1019 Last data filed at 09/15/16 0700  Gross per 24 hour  Intake           754.25 ml  Output             2100 ml  Net         -1345.75 ml   LBM: Last BM Date:  (no gas or output in colostomy x  slight clear/amber liquid) Baseline Weight: Weight: 50.2 kg (110 lb 10.7 oz) Most recent weight: Weight: 52 kg (114 lb 10.2 oz)       Palliative Assessment/Data: 20 % at best    Flowsheet Rows   Flowsheet Row Most Recent Value  Intake Tab  Referral Department  Hospitalist  Unit at Time of Referral  Cardiac/Telemetry Unit  Palliative Care Primary Diagnosis  Cancer  Date Notified  09/13/16  Palliative Care Type  New Palliative care  Reason for referral  Clarify Goals of Care  Date of Admission  09/11/16  Date first seen by Palliative Care  09/14/16  # of days Palliative referral response time  1 Day(s)  # of days IP prior to Palliative referral  2  Clinical Assessment  Palliative Performance Scale Score  20%  Psychosocial & Spiritual Assessment  Palliative Care Outcomes      Patient Active Problem List   Diagnosis Date Noted  . DNR (do not resuscitate) 09/14/2016  . Palliative care by specialist 09/14/2016  . Pressure injury of skin 09/12/2016  . Colostomy diversion  09/12/2016  . Atrial fibrillation with RVR ([place]) 09/11/2016  . AKI (acute kidney injury) ([place]) 09/11/2016  . Abdominal pain with vomiting   . Hydronephrosis of right kidney   . Ovarian mass, right   . SSS (sick sinus syndrome) ([place]) 04/20/2016  . RBBB 10/06/2015  . SBO (small bowel obstruction) 10/04/2015  . Cancer involving bladder by direct extension from ovary ([place]) 11/18/2014  . Protein-calorie malnutrition ([place]) 09/07/2013    Class: Acute  . Hematuria, gross 09/07/2013    Class: Acute  . PAT (paroxysmal atrial tachycardia) ([place]) 01/10/2013  . Malignant neoplasm of ovary invading into bladder & rectum  05/08/2012  . Small bowel obstruction, surgery 04/04/12 03/24/2012  . Hyponatremia 03/21/2012    Class: Acute  . Acute blood loss anemia 03/21/2012    Class: Acute  . Dehydration 03/20/2012    Class: Acute  . Leucocytosis 03/20/2012    Class: Acute  . Cough 03/20/2012    Class: Acute  . Colon cancer ([place])   . PAF (paroxysmal atrial fibrillation) ([place]) 03/09/2012  . Diverticulosis 03/09/2012  . Cardiac pacemaker 03/09/2012    Palliative Care Assessment & Plan   Patient Profile:   83 y.o. female  admitted on 09/11/2016 with a past medical history of hypertension, Afib, presented with 2 day history of decreased colostomy output, and the patient began having 2-3 episodes of emesis beginning on 09/10/2016.  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 On 09/02/2013, the patient underwent TURBTand fulguration when it was found that her ovarian cancer had invaded her urinary bladder. More recently, the patient had expiratory laparotomy with lysis of adhesions on 10/09/2015 secondary to small obstruction. The patient states that she had a recent cystoscopy performed by Dr. Zannie Cove one week prior to this admission at which time there is approximately 10% invasion of the bladder by her ovarian tumor, but decision was made to observe clinically),    CT of the abdomen and pelvis at the time of admission showed right ureteral dilatation that terminates at a large soft tissue mass in the right pelvis.  There was also dilated small bowel with decompressed distal small bowel consistent with a small bowel obstruction without clear transition point.  The patient has had previous  hospital admissions with rapid atrial arrhythmia often exacerbated by small bowel obstruction leading to inability to  take her oral medications. The patient has had a history of recurrent small bowel obstructions related to her metastatic ovarian carcinoma with extensive pelvic involvement including invasion of her urinary bladder.    Transitioning at EOL, focus is comfort, hope is for hospice facility in [place]  Recommendations/Plan:   Symptom management to enhance comfort and dignity  No life prolonging measures  Goals of Care and Additional Recommendations:  Limitations on Scope of Treatment: Full Comfort Care  Code Status:    Code Status Orders        Start     Ordered   09/11/16 1439  Do not attempt resuscitation (DNR)  Continuous    Question Answer Comment  In the event of cardiac or respiratory ARREST Do not call a "code blue"   In the event of cardiac or respiratory ARREST Do not perform Intubation, CPR, defibrillation or ACLS   In the event of cardiac or respiratory ARREST Use medication by any route, position, wound care, and other measures to relive pain and suffering. May use oxygen, suction and manual treatment of airway obstruction as needed for comfort.      09/11/16 1438    Code Status History    Date Active Date Inactive Code Status Order ID Comments User Context   04/15/2016  2:16 PM 04/16/2016 12:36 PM Full Code [id number]  Franchot Gallo, MD Inpatient   10/04/2015  9:57 PM 10/12/2015  5:07 PM DNR [id number]  Toy Baker, MD Inpatient   09/29/2015 10:58 AM 09/30/2015  2:36 PM Full Code [id number]  Franchot Gallo, MD Inpatient   11/18/2014 11:03 AM 11/19/2014 12:28 PM Full Code [id number]  Franchot Gallo, MD Inpatient   06/27/2014 10:43 AM 06/28/2014 12:00 PM Full Code [id number]  Jorja Loa, MD Inpatient   09/02/2013  8:49 PM 09/07/2013  5:37 PM DNR [id number]  Tivis Ringer, MD Inpatient   10/03/2012 11:59 AM 10/09/2012  1:27 PM Full Code [id number]  Lahoma Crocker, MD Inpatient   03/24/2012  4:14 PM 04/14/2012  5:08 PM Full Code [id number]  Owens Shark, NP  Inpatient    Advance Directive Documentation   Flowsheet Row Most Recent Value  Type of Advance Directive  Out of facility DNR (pink MOST or yellow form)  Pre-existing out of facility DNR order (yellow form or pink MOST form)  No data  "MOST" Form in Place?  No data       Prognosis:   < 2 weeks  Discharge Planning:  Hospice facility  Care plan was discussed with Dr Wynelle Cleveland  Thank you for allowing the Palliative Medicine Team to assist in the care of this patient.   Time In: 0720 Time Out: 0755 Total Time 35 min Prolonged Time Billed  no      Greater than 50%  of this time was spent counseling and coordinating care related to the above assessment and plan.  Wadie Lessen, NP  Please contact Palliative Medicine Team phone at [phone] for questions and concerns.

## 2016-09-15 NOTE — Care Management Important Message (Signed)
Important Message  Patient Details  Name: Victoria [name] MRN: [id number] Date of Birth: 1927/03/10   Medicare Important Message Given:  Yes    Kerin Salen 09/15/2016, 10:43 AMImportant Message  Patient Details  Name: Victoria [name] MRN: [id number] Date of Birth: 10-28-1926   Medicare Important Message Given:  Yes    Kerin Salen 09/15/2016, 10:42 AM

## 2016-09-15 NOTE — Progress Notes (Signed)
[facility] called and scheduled for 3:45pm  Transport Packet Completed.  Nurse given number for report.

## 2016-09-16 LAB — CULTURE, BLOOD (ROUTINE X 2)
CULTURE: NO GROWTH
Culture: NO GROWTH

## 2016-09-21 ENCOUNTER — Telehealth: Payer: Self-pay | Admitting: Cardiovascular Disease

## 2016-09-23 NOTE — Telephone Encounter (Signed)
Please tell Dr C that daughter in law called and said to let him know Captola passed away this morning. Thank him for everything and that [place] and him had a special bond.

## 2016-09-23 DEATH — deceased

## 2016-09-27 DIAGNOSIS — G893 Neoplasm related pain (acute) (chronic): Secondary | ICD-10-CM

## 2016-10-29 ENCOUNTER — Encounter: Payer: Medicare Other | Admitting: Cardiovascular Disease

## 2018-02-20 IMAGING — CR DG ABDOMEN 2V
2 series · 2 of 2 positions shown · non-contrast
Comparison: 10/10/2015

CLINICAL DATA: Small bowel obstruction, post laparotomy 10/09/2015,
nausea and vomiting

EXAM:
ABDOMEN - 2 VIEW

[w abdomen upright]
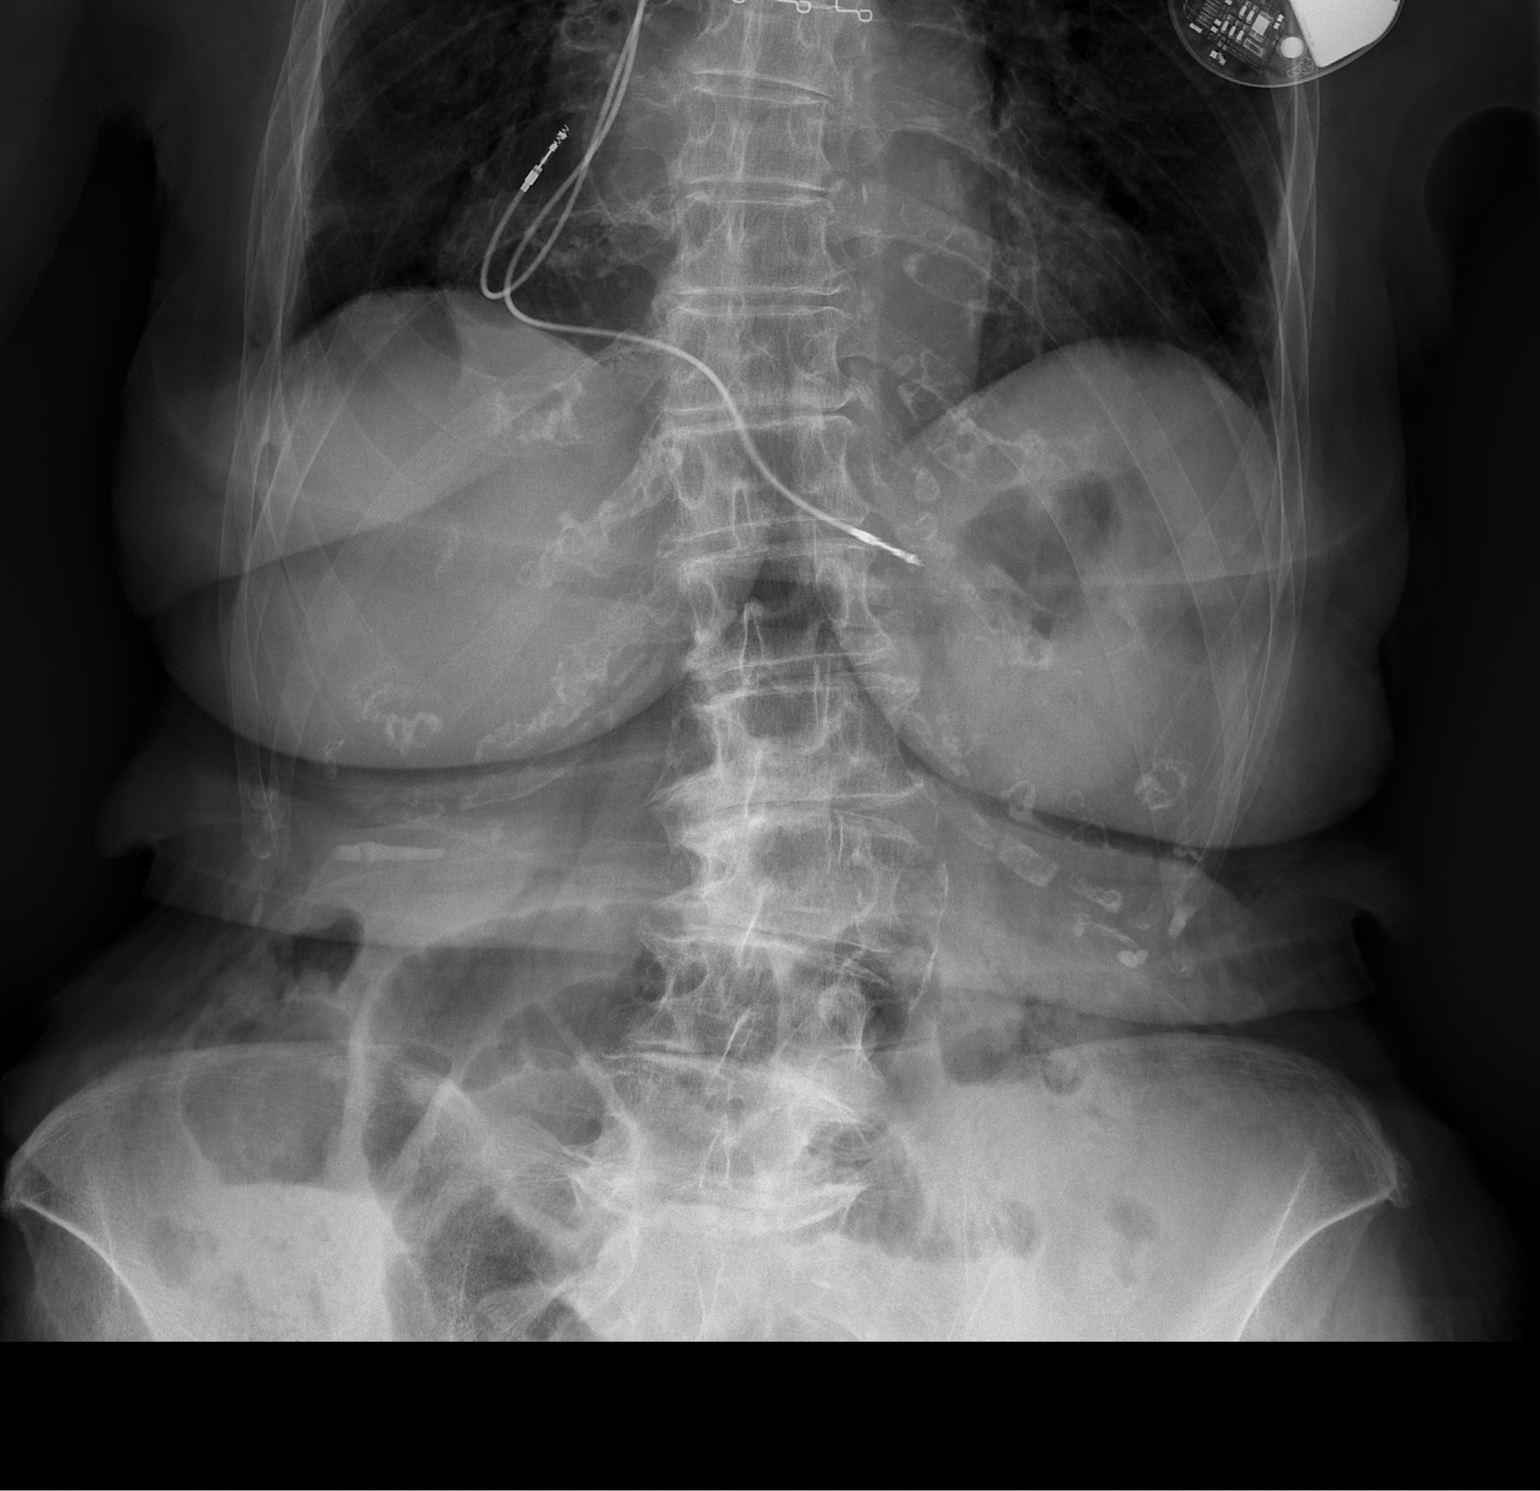

[t abdomen supine]
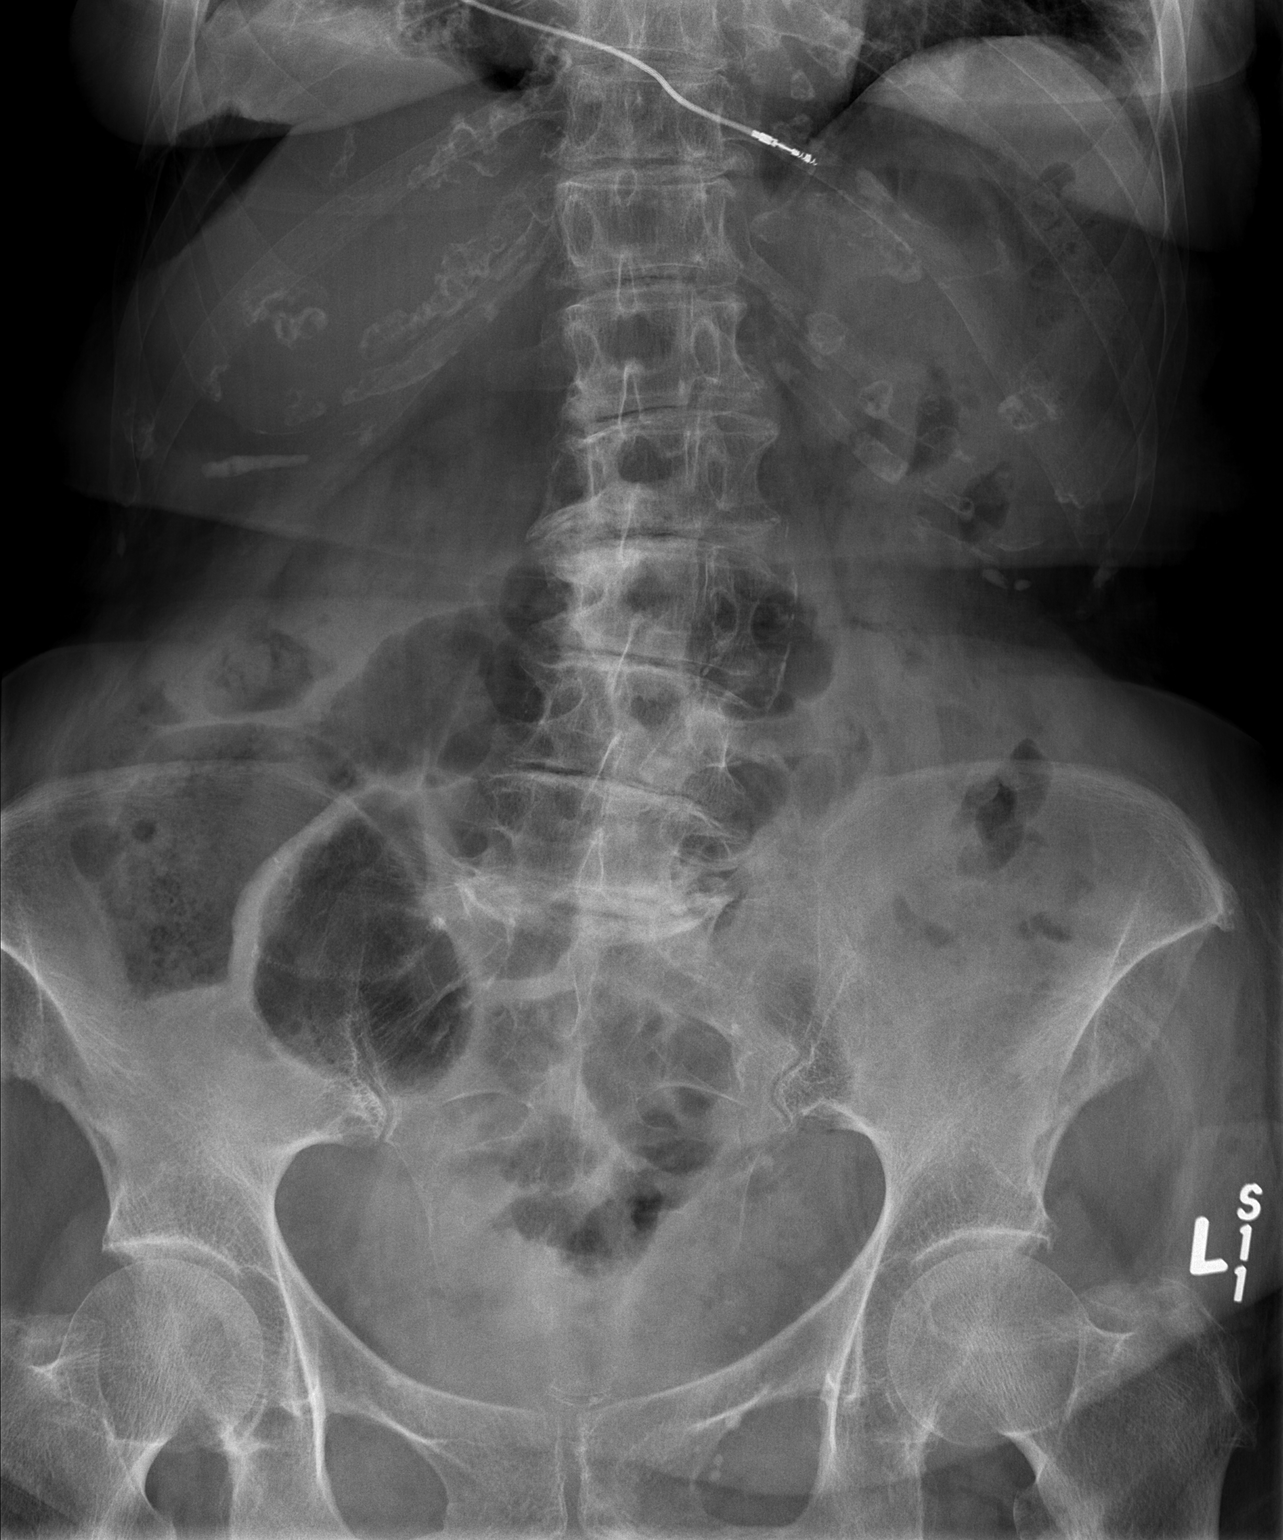

[2 of 2 positions shown; findings below may reference images not displayed]

FINDINGS: Heart appears enlarged with pacemaker leads projecting over RIGHT
atrium and RIGHT ventricle.

Dilated small bowel loops in the mid abdomen with paucity of colonic
gas question obstruction or ileus.

Ostomy LEFT lower quadrant.

No free intraperitoneal air or definite bowel wall thickening pre

Scattered atherosclerotic calcification.

Bones demineralized with degenerative disc disease changes and
scoliosis lumbar spine.

No definite urinary tract calcification.
IMPRESSION: Dilated air-filled small bowel loops in mid abdomen with paucity of
colonic gas which could represent postoperative ileus or
obstruction.

No definite bowel wall thickening or free intraperitoneal air.
